# Patient Record
Sex: Female | Born: 1935
Health system: Southern US, Community
[De-identification: ages and names within clinical notes are randomized; demographics above are authoritative.]

## PROBLEM LIST (undated history)

## (undated) DIAGNOSIS — I2699 Other pulmonary embolism without acute cor pulmonale: Secondary | ICD-10-CM

## (undated) DIAGNOSIS — I10 Essential (primary) hypertension: Secondary | ICD-10-CM

## (undated) DIAGNOSIS — M179 Osteoarthritis of knee, unspecified: Secondary | ICD-10-CM

## (undated) DIAGNOSIS — H269 Unspecified cataract: Secondary | ICD-10-CM

## (undated) DIAGNOSIS — M51369 Other intervertebral disc degeneration, lumbar region without mention of lumbar back pain or lower extremity pain: Secondary | ICD-10-CM

## (undated) DIAGNOSIS — N183 Chronic kidney disease, stage 3 unspecified: Secondary | ICD-10-CM

## (undated) DIAGNOSIS — K760 Fatty (change of) liver, not elsewhere classified: Secondary | ICD-10-CM

## (undated) HISTORY — PX: JOINT REPLACEMENT: SHX530

## (undated) HISTORY — DX: Essential (primary) hypertension: I10

## (undated) HISTORY — DX: Fatty (change of) liver, not elsewhere classified: K76.0

## (undated) HISTORY — PX: FRACTURE SURGERY: SHX138

## (undated) HISTORY — DX: Chronic kidney disease, stage 3 unspecified: N18.30

## (undated) HISTORY — DX: Unspecified cataract: H26.9

## (undated) HISTORY — DX: Other pulmonary embolism without acute cor pulmonale: I26.99

## (undated) HISTORY — DX: Other intervertebral disc degeneration, lumbar region without mention of lumbar back pain or lower extremity pain: M51.369

## (undated) HISTORY — DX: Osteoarthritis of knee, unspecified: M17.9

---

## 1997-09-08 ENCOUNTER — Other Ambulatory Visit: Admission: RE | Admit: 1997-09-08 | Discharge: 1997-09-08 | Payer: Self-pay | Admitting: Gynecology

## 1998-03-03 ENCOUNTER — Observation Stay (HOSPITAL_COMMUNITY): Admission: RE | Admit: 1998-03-03 | Discharge: 1998-03-04 | Payer: Self-pay | Admitting: Specialist

## 1998-03-03 ENCOUNTER — Encounter: Payer: Self-pay | Admitting: Specialist

## 1998-11-21 ENCOUNTER — Other Ambulatory Visit: Admission: RE | Admit: 1998-11-21 | Discharge: 1998-11-21 | Payer: Self-pay | Admitting: Gynecology

## 1999-02-21 ENCOUNTER — Encounter: Admission: RE | Admit: 1999-02-21 | Discharge: 1999-02-21 | Payer: Self-pay | Admitting: Family Medicine

## 1999-02-21 ENCOUNTER — Encounter: Payer: Self-pay | Admitting: Family Medicine

## 2000-08-20 ENCOUNTER — Encounter: Payer: Self-pay | Admitting: Orthopedic Surgery

## 2000-08-26 ENCOUNTER — Encounter: Payer: Self-pay | Admitting: Orthopedic Surgery

## 2000-08-26 ENCOUNTER — Inpatient Hospital Stay (HOSPITAL_COMMUNITY): Admission: RE | Admit: 2000-08-26 | Discharge: 2000-08-29 | Payer: Self-pay | Admitting: Orthopedic Surgery

## 2000-08-29 ENCOUNTER — Inpatient Hospital Stay (HOSPITAL_COMMUNITY)
Admission: RE | Admit: 2000-08-29 | Discharge: 2000-09-02 | Payer: Self-pay | Admitting: Physical Medicine & Rehabilitation

## 2000-12-02 ENCOUNTER — Encounter: Payer: Self-pay | Admitting: Orthopedic Surgery

## 2000-12-04 ENCOUNTER — Inpatient Hospital Stay (HOSPITAL_COMMUNITY): Admission: RE | Admit: 2000-12-04 | Discharge: 2000-12-08 | Payer: Self-pay | Admitting: Orthopedic Surgery

## 2000-12-04 ENCOUNTER — Encounter: Payer: Self-pay | Admitting: Orthopedic Surgery

## 2000-12-08 ENCOUNTER — Inpatient Hospital Stay (HOSPITAL_COMMUNITY)
Admission: RE | Admit: 2000-12-08 | Discharge: 2000-12-11 | Payer: Self-pay | Admitting: Physical Medicine & Rehabilitation

## 2001-01-11 ENCOUNTER — Encounter: Payer: Self-pay | Admitting: Emergency Medicine

## 2001-01-11 ENCOUNTER — Inpatient Hospital Stay (HOSPITAL_COMMUNITY): Admission: EM | Admit: 2001-01-11 | Discharge: 2001-01-12 | Payer: Self-pay | Admitting: Emergency Medicine

## 2001-03-16 ENCOUNTER — Observation Stay (HOSPITAL_COMMUNITY): Admission: RE | Admit: 2001-03-16 | Discharge: 2001-03-17 | Payer: Self-pay | Admitting: Orthopedic Surgery

## 2001-03-16 ENCOUNTER — Encounter: Payer: Self-pay | Admitting: Orthopedic Surgery

## 2001-04-15 ENCOUNTER — Encounter: Payer: Self-pay | Admitting: Orthopedic Surgery

## 2001-04-15 ENCOUNTER — Inpatient Hospital Stay (HOSPITAL_COMMUNITY): Admission: RE | Admit: 2001-04-15 | Discharge: 2001-04-18 | Payer: Self-pay | Admitting: Orthopedic Surgery

## 2005-03-06 ENCOUNTER — Ambulatory Visit: Payer: Self-pay | Admitting: Pulmonary Disease

## 2005-03-06 ENCOUNTER — Inpatient Hospital Stay (HOSPITAL_COMMUNITY): Admission: EM | Admit: 2005-03-06 | Discharge: 2005-03-14 | Payer: Self-pay | Admitting: Emergency Medicine

## 2005-03-06 ENCOUNTER — Encounter (INDEPENDENT_AMBULATORY_CARE_PROVIDER_SITE_OTHER): Payer: Self-pay | Admitting: *Deleted

## 2005-03-06 ENCOUNTER — Ambulatory Visit: Payer: Self-pay | Admitting: Internal Medicine

## 2005-03-08 ENCOUNTER — Encounter (INDEPENDENT_AMBULATORY_CARE_PROVIDER_SITE_OTHER): Payer: Self-pay | Admitting: Specialist

## 2005-04-04 ENCOUNTER — Ambulatory Visit: Payer: Self-pay | Admitting: Internal Medicine

## 2005-05-08 ENCOUNTER — Ambulatory Visit: Payer: Self-pay | Admitting: Internal Medicine

## 2007-05-02 ENCOUNTER — Emergency Department (HOSPITAL_COMMUNITY): Admission: EM | Admit: 2007-05-02 | Discharge: 2007-05-02 | Payer: Self-pay | Admitting: Emergency Medicine

## 2007-05-26 ENCOUNTER — Encounter: Admission: RE | Admit: 2007-05-26 | Discharge: 2007-05-26 | Payer: Self-pay | Admitting: *Deleted

## 2007-05-27 ENCOUNTER — Ambulatory Visit (HOSPITAL_BASED_OUTPATIENT_CLINIC_OR_DEPARTMENT_OTHER): Admission: RE | Admit: 2007-05-27 | Discharge: 2007-05-28 | Payer: Self-pay | Admitting: *Deleted

## 2010-06-19 NOTE — Op Note (Signed)
NAME:  [name], [name] NO.:  [id number]   MEDICAL RECORD NO.:  [id number]          PATIENT TYPE:  AMB   LOCATION:  DSC                          FACILITY:  [facility]   PHYSICIAN:  Tennis Must Meyerdierks, M.D.DATE OF BIRTH:  October 09, 1931   DATE OF PROCEDURE:  DATE OF DISCHARGE:                               OPERATIVE REPORT   PREOPERATIVE DIAGNOSIS:  Comminuted intra-articular fracture, left  distal radius.   POSTOPERATIVE DIAGNOSIS:  Comminuted intra-articular fracture, left  distal radius.   PROCEDURE:  Open reduction and internal fixation, left distal radius  fracture.   SURGEON:  Lowell Bouton, MD   ANESTHESIA:  General.   OPERATIVE FINDINGS:  The patient had significant radial displacement and  a longitudinal split of the fractured distal radius.  The bone was quite  soft, and there was significant callus formation.  The fracture was just  over 3 weeks' old.   PROCEDURE:  Under general anesthesia with a tourniquet on the left arm,  the left hand was prepped and draped in the usual fashion.  After  explaining the limb, the tourniquet was inflated to 250 mmHg.  A  longitudinal traction was applied across finger traps on the index,  long, and ring fingers over the end of the table with 10 pounds.  A  longitudinal incision was made overlying the FCR tendon and carried down  through the subcutaneous tissues.  Sharp dissection was carried through  the FCR tendon sheath and the tendon was retracted ulnarly.  The floor  of the sheath was incised with a scissors.  Freer elevator was then used  to elevate the FPL tendon ulnarly and the pronator quadratus was  identified.  A Weitlaner retractor was inserted.  The pronator was  released from its radial attachments sharply with a knife, and an  elevator was used to elevate it ulnarly.  The fracture site was then  identified and callus that had formed was removed with a rongeur.  A  Freer elevator was used to  manipulate the fracture fragments.  The  fracture was then reduced as close to anatomic as possible.  The styloid  fragment had remained slightly radial.  A 0.045 K-wire was placed  through the radial styloid across the fracture site to hold the styloid  fragment in place.  This helped to hold the reduction while a DVR plate  was applied.  Fluoroscopy was used during the procedure to align the  fragments.  A left short DVR plate was then applied with 3.5-mm screws  proximally and 2.5 pegs distally.  Four pegs were used and x-rays showed  good position of the fracture and the pegs of the screws.  There was no  evidence of penetration of the articular surface.  The K-wire was left  in and bent over and left protruding from the skin.  The wound was  irrigated copiously with saline.  A TLS drain was inserted.  0.5%  Marcaine was placed in the skin edges for pain control.  Pronator  quadratus was then repaired with a 4-0 Vicryl suture, subcutaneous  tissue was  closed with  4-0 Vicryl, and the skin with a 3-0 subcuticular Prolene.  Steri-Strips were applied followed by sterile dressings and a volar  wrist splint.  The patient tolerated the procedure well and went to the  recovery room awake and stable in good condition.      Lowell Bouton, M.D.  Electronically Signed     EMM/MEDQ  D:  05/27/2007  T:  05/28/2007  Job:  [id number]

## 2010-06-22 NOTE — Op Note (Signed)
[facility]  Patient:    [name], [name] Visit Number: [id number] MRN: [id number]          Service Type: SUR Location: 4W 0469 01 Attending Physician:  Skip Mayer Dictated by:   Georges Lynch Darrelyn Hillock, M.D. Proc. Date: 03/16/01 Admit Date:  03/16/2001                             Operative Report  PREOPERATIVE DIAGNOSIS:  Posterior dislocation of a left total hip arthroplasty.  POSTOPERATIVE DIAGNOSIS:  Posterior dislocation of a left total hip arthroplasty.  OPERATION/PROCEDURE:  Closed reduction of a dislocated total hip arthroplasty on left.  SURGEON:  Georges Lynch. Gioffre, M.D.  ASSISTANT:  ______  PROCEDURE:  Under general anesthesia a gentle closed reduction of a posterior dislocation of the left hip was carried out.  The hip was easily reduced.  She was placed on abduction pillow and will be admitted overnight and placed in a brace.  I did discuss the case with her husband and son.  I think that she is going to need to have a revision of her polyethylene insert since this is her second dislocation and since the fact that her hip appears to be so unstable. Dictated by:   Georges Lynch Darrelyn Hillock, M.D. Attending Physician:  Skip Mayer DD:  03/16/01 TD:  03/16/01 Job: [id number]

## 2010-06-22 NOTE — H&P (Signed)
[facility]  Patient:    [name], [name] Visit Number: [id number] MRN: [id number]          Service Type: SUR Location: 4W 0469 01 Attending Physician:  Skip Mayer Dictated by:   Ralene Bathe, P.A. Admit Date:  03/16/2001 Discharge Date: 03/17/2001                           History and Physical  DATE OF BIRTH:  1931-09-28  CHIEF COMPLAINT:  Left hip instability.  HISTORY OF PRESENT ILLNESS:  Ms. Ballengee is a 77 year old patient of Dr. Darrelyn Hillock who is now approximately three months out from total hip arthroplasty on the left.  She has had two left hip dislocations.  She is felt to be unstable and is currently being treated in her pelvic band and abduction brace.  She has instability type symptoms, and the subluxation type sensation with certain movements.  At this time it is felt that conversion to a constrained cup is indicated.  The risks and benefits were discussed with the patient at length, and she wishes to proceed.  PAST MEDICAL HISTORY:  Osteoarthritis.  PAST SURGICAL HISTORY: 1. Right total knee arthroplasty. 2. Left total knee arthroplasty. 3. Status post closed reduction of left total hip arthroplasty x 2. 4. ORIF right wrist. 5. ORIF right shoulder.  MEDICATIONS:  Percocet, Ambien, and Xanax p.r.n.  ALLERGIES:  VALIUM causes nausea and vomiting.  SOCIAL HISTORY:  Does not smoke or drink.  She lives at home with her husband and is quite active in gardening and work.  She had a greenhouse.  FAMILY HISTORY:  Noncontributory.  REVIEW OF SYSTEMS:  The patient denies any recent fevers, chills, night sweats.  No bleeding tendencies.  CNS:  No blurred or double vision.  No headaches, seizures, paralysis.  RESPIRATORY:  No shortness of breath, productive cough, hemoptysis.   CARDIOVASCULAR:  No chest pain, angina, orthopnea.  GASTROINTESTINAL:  No nausea, vomiting, constipation, melena, bloody stools.   GENITOURINARY:  No dysuria, hematuria.  MUSCULOSKELETAL:  As pertinent to present illness.  PHYSICAL EXAMINATION:  GENERAL:  The patient is a well-developed, well-nourished 77 year old female. She is evaluated in pelvic abduction brace.  VITAL SIGNS:  Blood pressure 118/64.  HEENT:  Normocephalic.  Extraocular motions intact.  NECK:  Supple.  No lymphadenopathy.  No carotid bruits appreciated on exam.  CHEST:  Clear to auscultation bilaterally.  No rales, no rhonchi.  HEART:  Regular rate and rhythm.  No murmurs, gallops, rubs, heaves, or thrills.  ABDOMEN:  Positive bowel sounds.  Soft and nontender.  EXTREMITIES:  Neurovascular is intact to bilateral lower extremities.  No pitting edema is noted.  IMPRESSION:  Left total hip instability status post left total hip arthroplasty.  PLAN:  Admission and conversion to left total hip arthroplasty with a constrained cup. Dictated by:   Ralene Bathe, P.A. Attending Physician:  Skip Mayer DD:  04/08/01 TD:  04/08/01 Job: [id number]

## 2010-06-22 NOTE — Discharge Summary (Signed)
[facility]  Patient:    ALOHA, [name]                  MRN: [id number] Adm. Date:  [id number] Disc. Date: 08/29/00 Attending:  Skip Mayer Dictator:   Alexzandrew L. Julien Girt, P.A.-C. CC:         Dr. Faustino Congress, [facility]  Laurier Nancy, M.D.   Discharge Summary  ADMITTING DIAGNOSES:  1. End-stage degenerative arthritis, right hip.  2. Insomnia.  DISCHARGE DIAGNOSES:  1. Severe degenerative arthritis, right hip, status post right total hip     replacement and arthroplasty.  2. Mild postoperative anemia (not requiring transfusion).  3. Positive fluid overload following surgery, improved.  4. Insomnia.  PROCEDURES:  The patient was taken to the operating room on August 26, 2000, and underwent a right total hip replacement and arthroplasty.  SURGEON:  Georges Lynch. Darrelyn Hillock, M.D.  ASSISTANT:  Kerrin Champagne, M.D.  ANESTHESIA:  General.  CONSULTS:  Rehabilitation services, Dr. Johna Roles.  HISTORY OF PRESENT ILLNESS:  The patient is a 77 year old female with chronic progressive bilateral hip pain.  She has been seen for several months, recently with increasing hip pain on the right which started to interfere with her daily activities.  She has been treated conservatively in the past with anti-inflammatories and analgesics.  The pain started to interfere to the point where she was having difficulty walking, and it was felt she would benefit from undergoing replacement of the hips.  We pursued to undergo surgery on the right hip first.  She subsequently was admitted to the hospital.  LABORATORY DATA:  CBC on admission showed hemoglobin 12.7, hematocrit 37.2, white cell count 4.9, red cell count 3.66.  Serial H&Hs followed throughout hospital course.  Postoperative hemoglobin down to 12.2 and 35.8.  Last H&H was 9.7 with a hematocrit of 28.4.  PT/PTT on admission was 12.3 and 31, respectively.  Serial pro time/INRs were  followed for Coumadin protocol.  Last noted PT/INR at time of transfer 17.6 and 1.6.  Chem panel on admission all within normal limits.  Followed BMP on August 28, 2000, showed slight increase in glucose from 98 to 140, otherwise BMP within normal limits.  Urinalysis preoperatively showed small leukocyte esterase, rare epithelial cells, few bacteria.  Followup urinalysis on August 26, 2000, negative UA.  Blood group type A-positive.  X-RAYS:  Chest x-ray, no active disease, mild right lower lobe atelectasis versus scarring, August 20, 2000.  Hip films done on August 20, 2000, showed severe osteoarthritis of right hip with complete loss of superior joint space, height, and associated sclerosis of mild deformity of the femoral head.  EKG dated August 20, 2000, normal sinus rhythm, normal EKG, no significant changes in last tracing confirmed by Dr. Eden Emms.  HOSPITAL COURSE:  The patient was admitted to [facility], taken to operating room, and underwent the above-mentioned procedure without complication.  The patient tolerated the procedure well and was later transferred to the recovery room and then to orthopedic floor for continued postoperative care.  The patient was placed on PCA analgesic for pain control following surgery.  She was noted to have positive fluid balance overload. She was given Lasix for some mild diuresis.  Electrolytes were followed.  She was placed on touchdown weightbearing.  Physical therapy and occupational therapy were consulted to assist with postoperative ambulation, gait training, and hip precautions.  The patient was seen by rehab service, Dr. Johna Roles postoperative, and it was felt  she would benefit from inpatient rehab stay.  It was felt she would be transferred over when a bed became available.  Dressing changes were initiated on postoperative day two and wound was healing well throughout hospital course.  Foley catheter was discontinued on postoperative  day two.  She did have a spike in temperature on postoperative day two of 101.1.  This was treated with incentive spirometry and antiemetics.  Temperature did come back down and responded to the incentive spirometry and medications.  It was noted that there was a bed possibly opened on rehab on August 29, 2000.  The patient was doing well, and she had been weaned off her PCA analgesics over to p.o. analgesics.  She was starting to progress with physical therapy.  She was already ambulating approximately 70 feet by postoperative day three.  It was decided that the patient could be transferred over at that time.  DISCHARGE PLAN:  1. The patient will be transferred to [facility] on August 29, 2000.  2. Discharge diagnoses, please see above.  DISCHARGE MEDICATIONS:  1. Colace 100 mg p.o. b.i.d.  2. Trinsicon 1 p.o. t.i.d.  3. Percocet 1-2 q.4-6h. as needed for pain.  4. Robaxin 500 mg 1 p.o. q.6-8h. p.r.n. spasm.  5. Coumadin protocol.  6. Ambien 10 mg p.o. q.h.s. p.r.n. sleep.  7. Phenergan 25 mg p.o. q.4-6h. p.r.n. nausea.  8. Reglan 10 mg p.o. q.8h. p.r.n. nausea.  9. Tylenol 1-2 q.4-6h. as needed for mild pain. 10. Restoril 15-30 mg p.o. q.h.s. p.r.n. sleep.  DIET:  As tolerated.  ACTIVITIES:  She is touchdown weightbearing to the right lower extremity. Gait training as per PT/OT for ADLs on rehab.  PRECAUTIONS:  Hip precautions at all times.  The patient is to use her knee immobilizer at night while in bed.  During the day she may use a regular pillow between the legs for hip precautions.  Dressing changes daily.  FOLLOWUP:  The patient will follow up with Dr. Georges Lynch. Gioffre in two weeks from date of surgery or following discharge from the rehab unit.  Please contact the office at [phone] for appointment time.  DISPOSITION:  [facility].  CONDITION ON DISCHARGE:  Improved. DD:  08/29/00 TD:  08/29/00 Job: [id number]

## 2010-06-22 NOTE — Discharge Summary (Signed)
[facility]  Patient:    [name], [name] Visit Number: [id number] MRN: [id number]          Service Type: SUR Location: 4W 0452 01 Attending Physician:  Skip Mayer Dictated by:   Ottie Glazier. Wynona Neat, P.A.-C. Admit Date:  04/15/2001 Discharge Date: 04/18/2001                             Discharge Summary  ADMITTING DIAGNOSES: 1. History of left total hip arthroplasty x2 with closed reduction for    displacement. 2. History of bilateral knee arthroplasty. 3. History of open reduction internal fixation to the right wrist. 4. History of open reduction internal fixation to the right shoulder.  DISCHARGE DIAGNOSES: 1. History of left total hip arthroplasty x2 with closed reduction for    displacement; improved. 2. History of bilateral knee arthroplasty. 3. History of open reduction internal fixation to the right wrist. 4. History of open reduction internal fixation to the right shoulder.  PROCEDURE:  Left total hip arthroplasty revision with revision of polyethylene cup and constrained cup liner of the left hip per Dr. Darrelyn Hillock.  HISTORY OF PRESENT ILLNESS:  The patient is a 77 year old female who is approximately 3 months from a total hip arthroplasty on the left, the patient unfortunately had two subsequent left hip dislocations thereafter.  The patient feels as though the hip is unstable and is currently being treated in a pelvic ______ adduction brace.  Secondary to these instability type symptoms as well as the two subsequent hip dislocations decision made to proceed with operative intervention in the form of a conversion to a constrained cup per Dr. Darrelyn Hillock.  The risks and benefits of the surgery were discussed in great detail with the patient.  Preoperative labs showed that her H&H were 13.4 and 38.4; PT and INR were 12.1 and 0.9; her BMET was within normal limits; her liver functions were within normal limits; her UA showed few  bacteria, few epithelial cells and small amount of leukocyte esterase.  HOSPITAL COURSE:  The patient underwent a left total hip revision with polyethylene liner and constrained cup placement per Dr. Darrelyn Hillock on April 15, 2001.  Please see operative report for details.  The patient tolerated the procedure very well and on postoperative day #1 she was awake, no complaints, did have an episode of nausea and vomiting the day prior, states it is resolved today, she is very eager for physical therapy and then discharge home as soon as possible, again stating her pain was well controlled, her vital signs were stable, she had a T-max of 100, hemoglobin was stable at 10.7, left hip showed the dressing was clean, dry, intact, she was moving the left lower extremity well, sensation was intact, positive pedal pulses.  The patient will continue physical therapy and occupational therapy for the remaining part of the day.  PCA would also be weaned.  On postoperative day #2 the patient was awake, stating she was doing fairly well, she did have a little headache earlier that morning otherwise no major complaints, her vital signs were stable, she was afebrile, H&H were 10.1 and 29.7, PT and INR were 15 and 1.2. Examination of the left hip showed that the dressings were clean, dry, intact, the skin was intact, incision was intact, motor and sensory functions were intact.  Due to her improving condition the PCA was discontinued as well as her IV fluids, a _________ was then  initiated, anticipated discharge home would be for the following morning.  On postop day #3 the patient was up eating breakfast, stating that she was doing extremely well, no problems or complaints, she was eager for discharge.  We did have a long discussion concerning her postoperative Coumadin management as the patient did not need home health physical therapy, however, she did need followup regarding her Coumadin management and she  stated that Dr. Tommi Rumps office would provide this for her and care management had apparently already set this up on April 16 2001, therefore, Dr. Tommi Rumps office would be aware of her postoperative anticoagulation needs and management.  Her vital signs were stable, she was afebrile, PT and INR were 16.0 and 1.4, left hip incision was clean, dry, and intact, no discharge, no signs/symptoms of infection, motor and sensory seems intact.  FINAL DIAGNOSES: 1. Status post left total hip arthroplasty revision. 2. History of bilateral knee arthroplasty. 3. History of open reduction internal fixation to the right wrist and    shoulder.  CONDITION ON DISCHARGE:  Improved.  DIET:  Regular.  ACTIVITY:  She will be weightbearing as tolerated.  WOUND CARE:  Keep dressings clean, dry, and intact.  DISCHARGE INSTRUCTIONS:  Again, no home health PT necessary.  She will follow up with Dr. Milus Glazier for anticoagulation management therapy.  DISCHARGE MEDICATIONS: 1. Percocet 5 one to two p.o. q.4-6 p.r.n. (#40). 2. Robaxin 500 mg one p.o. q.6-8 p.r.n. (#40). 3. Coumadin per pharmacy x4 weeks.  FOLLOWUP:  She is to follow up with Dr. Darrelyn Hillock in 2 weeks.  Call [phone] for appointment, questions, or concerns. Dictated by:   Ottie Glazier. Wynona Neat, P.A.-C. Attending Physician:  Skip Mayer DD:  04/23/01 TD:  04/24/01 Job: [id number]

## 2010-06-22 NOTE — Discharge Summary (Signed)
[place]  Patient:    [name], [name]                  MRN: [id number] Adm. Date:  [id number] Disc. Date: [id number] Attending:  Faith Rogue T Dictator:   Junie Bame, P.A. CC:         Georges Lynch Darrelyn Hillock, M.D.  Elvina Sidle, M.D.   Discharge Summary  DISCHARGE DIAGNOSES: 1. Status post right total hip replacement. 2. Anemia. 3. Insomnia. 4. Constipation.  HISTORY OF PRESENT ILLNESS:  The patient is a 77 year old white female with a past medical history unremarkable.  Admitted on August 26, 2000, to [facility] for right total hip replacement by Dr. Darrelyn Hillock due to end-stage OA.  Hospital course significant for anemia secondary to blood loss.  Physical therapy report at that time indicated the patient was ambulating at close supervision 110 feet with standard walker and transfer sit-to-stand at supervision level. The patient is touchdown weightbearing.  The patient was on Coumadin for DVT prophylaxis.  Primary medical Shantel Helwig is Dr. Milus Glazier.  PAST MEDICAL HISTORY: 1. OA. 2. Sleep disorder. 3. Right shoulder fracture. 4. Right wrist fracture. 5. Cataracts.  PAST SURGICAL HISTORY:  Manipulation and pinning of right wrist fracture.  MEDICATIONS PRIOR TO ADMISSION:  Vicodin and Restoril.  ALLERGIES:  DARVOCET.  FAMILY HISTORY:  Noncontributory.  SOCIAL HISTORY:  Patient lives with husband in [place].  She was independent prior to admission without any device.  She has two stairs in home.  HOSPITAL COURSE:  [name] was admitted to rehabilitation on August 29, 2000, for comprehensive inpatient rehabilitation, where she received more than three hours of PT and OT therapies daily.  [name] had a short stay in rehabilitation.  She did fairly well.  Besides anemia and constipation, there were no major other medical issues during her four-day stay in rehabilitation. The patient was on Trinsicon b.i.d. during her  entire stay in rehabilitation for anemia, and the patient was on Senokot-S 2 tablets p.o. q.h.s. for constipation.  Latest laboratories indicated that the patients hemoglobin was 9.7, hematocrit 27.8, white blood cell count was 5.6, platelets 430.  INR at the time of discharge was 2.0.  Sodium 141, potassium 3.5, glucose 105, BUN was 9, creatinine 0.7.  Urinalysis was negative.  At the time of discharge all vital signs were stable.  Physical exam was unremarkable.  Staples were still intact.  Surgical incision demonstrated no signs of infection.  The patient was discharged home with family.  PT report at the time of discharge indicated the patient was ambulating modified independently 150 feet with standard walker, can transfer sit-to-stand with modified independence.  She can perform all ADLs modified independently.  DISCHARGE MEDICATIONS: 1. Trinsicon 1 tablet twice daily. 2. Coumadin 2 mg 1/2 per day with supper through October 03, 2000. 3. Restoril 50 mg, to resume home dose. 4. Oxycodone 5 mg 1-2 tablets every four to six hours as needed for pain. 5. Senokot-S 2 pills at bedtime for constipation. 6. Vioxx 25 mg 1 p.o. per day.  ACTIVITY:  To use walker.  Observe hip precautions.  WOUND CARE:  Keep area clean and dry.  DISCHARGE INSTRUCTIONS:  She is to receive [facility] for PT and to monitor Coumadin.  First draw will be August 1 and to continue until September 06, 2000.  FOLLOW-UP:  She is to follow up with Dr. Darrelyn Hillock within one week, with primary care Dontarious Schaum within four to six weeks.  Follow up with Dr. Riley Kill as needed. D:  09/09/00 TD:  09/11/00 Job: [id number]

## 2010-06-22 NOTE — Discharge Summary (Signed)
[place]. [facility]  Patient:    POOJA, CAMUSO Visit Number: [id number] MRN: [id number]          Service Type: [facility] Location: [id number] Attending Physician:  Faith Rogue T Dictated by:   Mcarthur Rossetti. Angiulli, P.A. Admit Date:  12/08/2000 Discharge Date: 12/11/2000   CC:         Windy Fast A. Darrelyn Hillock, M.D.             Dr. August Saucer of [facility]                           Discharge Summary  DISCHARGE DIAGNOSES: 1. Left total hip replacement December 04, 2000. 2. Postoperative anemia. 3. History of right total hip replacement July 2002.  HISTORY OF PRESENT ILLNESS:  A 77 year old female with history of right total hip replacement July 2002, of which she did receive inpatient rehab services for, who now presented December 04, 2000, with end-stage osteoarthritis of the left hip and no relief with conservative care.  She underwent a left total hip replacement on December 04, 2000, per Georges Lynch. Darrelyn Hillock, M.D.  She was placed on Coumadin for deep venous thrombosis prophylaxis and touchdown weightbearing.  Postoperative pain management.  No chest pain or shortness of breath.  She was minimal assistance for ambulation. Latest hemoglobin 8.7. INR 1.7.  Chest x-ray negative.  She was admitted for a comprehensive rehab program.  PAST MEDICAL HISTORY:  Insomnia.  PAST SURGICAL HISTORY:  Right total hip replacement July 2002, cataract surgery, right wrist surgery, and open reduction and internal fixation of right humerus.  ALLERGIES:  DARVOCET.  MEDICATIONS PRIOR TO ADMISSION:  Vioxx.  SOCIAL HISTORY:  No alcohol, no tobacco.  She lives with her husband in [place], [place]. She was independent prior to admission.  She lives in a one level home with three steps to entry.  Husband with limited assistance secondary to arthritis.  Son can also assist.  HOSPITAL COURSE:  The patient did well while in rehabilitation services with therapies initiated  on a a b.i.d. basis.  The following issues were followed during the patients rehab course.  Pertaining to Ms. Gerringers left total hip replacement, she remained stable.  Surgical site healing nicely.  No sign of infection. She would follow up with Dr. Darrelyn Hillock for removal of clips. She was touchdown weightbearing with a advisement for total hip precautions.  Her mobility continued to improve. She would receive home health physical and occupational therapy per [facility].  She continued on Coumadin for deep venous thrombosis prophylaxis. She had no bleeding episodes.  Latest INR of 2.2.  She would complete Coumadin protocol with home health nurse to be provided.  Blood pressures remained controlled throughout her rehab course. There was no headache or dizziness.  Her latest hemoglobin was 9.3, hematocrit 27.1.  This showed a generous improvement from 8.7.  She continued on iron supplement.  She had no bowel or bladder disturbances.  Overall for her functional mobility, she was ambulating household functional distances with a walker, essentially independent to standby assist in all areas of activities of daily living, of dressing, grooming, and homemaking.  Overall, her strength and endurance greatly improved.  She was encouraged with her overall progress.  She was discharged to home.  DISCHARGE MEDICATIONS: 1. Coumadin 5 mg daily to complete Coumadin protocol. 2. Trinsicon twice daily. 3. Restoril 15 mg one or two tablets at bedtime as needed.  4. Tylox one or two tablets every four hours as needed for pain. 5. Tylenol as needed.  ACTIVITY:  Touchdown weightbearing with walker with hip precautions.  DIET:  Regular.  WOUND CARE:  Follow-up with Dr. Darrelyn Hillock for removal of staples.  DISCHARGE INSTRUCTIONS:  No aspirin or ibuprofen while on Coumadin.  Home health nurse per [facility] for prothrombin time to complete Coumadin therapy.  FOLLOW-UP:  She should follow up  with Dr. August Saucer, of [facility] [place], for medical management. Dictated by:   Mcarthur Rossetti. Angiulli, P.A. Attending Physician:  Faith Rogue T DD:  12/10/00 TD:  12/11/00 Job: [id number]

## 2010-06-22 NOTE — Op Note (Signed)
[name]. [facility]  Patient:    [name] Visit Number: [id number] MRN: [id number]          Service Type: [facility] Location: [id number] Attending Physician:  Faith Rogue T Proc. Date: 12/04/00 Admit Date:  12/08/2000 Discharge Date: 12/11/2000                             Operative Report  SURGEON:  Windy Fast A. Darrelyn Hillock, M.D.  ASSISTANTs:  Philips J. Montez Morita, M.D. and Alexzandrew L. Perkins, P.A.-C.  PREOPERATIVE DIAGNOSIS:  Severe degenerative arthritis of the left hip.  POSTOPERATIVE DIAGNOSIS:  Severe degenerative arthritis of the left hip.  OPERATION:  Left total hip arthroplasty utilizing the Osteonics system.  The sizes used was a 54 mm cup with two screws and a 10 degree insert.  The prosthesis itself, we utilized a +10 C-tapered head on a size 9 Secure-Fit stem.  No cement was used.  DESCRIPTION OF PROCEDURE:  Under general anesthesia, a routine orthopedic prep and draping of the left hip was carried out.  A posterolateral approach of the hip was carried out in the usual fashion.  She had 1 g of IV Ancef.  At this time, an incision was made over the posterolateral aspect of the left hip, bleeders identified and cauterized.  I then went down and incised the iliotibial band and dissected the gluteal muscles by blunt dissection. Following this, the self-retaining retractors were inserted.  I then went down and detached the external rotators with great care taken not to injure the underlying sciatic nerve.  Following this, I did a capsulectomy.  I dislocated the femoral head.  I amputated the femoral head with an oscillating saw at the appropriate neck length.  We then carried out a reaming and rasping for a porous coated component up to a size 9 Secure-Fit.  Following this, the distal tip was reamed to a 13.5 mm diameter.  Once the femur was prepared, we then completed our capsulectomy.  The retractors then were inserted to expose  the acetabulum, and we began the reaming up to a size 54 mm cup.  We had good bleeding bone at this time.  The trial components were inserted.  We went through a trial range of motion.  We first utilized a +5 C-tapered head, then a +10, and felt that the +10 was more stable.  We then removed the trial components from the acetabulum, irrigated the acetabulum, and inserted our permanent 54 mm cup, and two screws were inserted for fixation purposes.  We then went through trials once again to make sure we had the appropriate trial cup position.  We had excellent stability.  We then inserted our permanent 10 degree insert into the acetabulum and then inserted our permanent Secure-Fit size 9 stem into the femur.  We then went through trials of the C-tapered head with a +5 and then a +10.  The +10 was much more stable, so we selected a permanent +10 C-tapered head and applied that.  We thoroughly irrigated out the area.  Good hemostasis was maintained.  We then reattached the rotators in the usual fashion with #1 Tycron.  The remaining part of the wound was closed in layers in the usual fashion, and sterile Neosporin dressing was applied. Attending Physician:  Faith Rogue T DD:  12/04/00 TD:  12/05/00 Job: [id number]

## 2010-06-22 NOTE — Op Note (Signed)
[name]. [facility]  Patient:    [name], [name] Visit Number: [id number] MRN: [id number]          Service Type: SUR Location: 5000 [id number] Attending Physician:  Verlee Rossetti. Dictated by:   Almedia Balls Ranell Patrick, M.D. Proc. Date: 01/11/01 Admit Date:  01/11/2001   CC:         Windy Fast A. Darrelyn Hillock, M.D.   Operative Report  PREOPERATIVE DIAGNOSIS:  Left total hip replacement closed dislocation.  POSTOPERATIVE DIAGNOSIS:  Left total hip replacement closed dislocation.  PROCEDURE:  Closed reduction of left total hip replacement.  SURGEON:  Almedia Balls. Ranell Patrick, M.D.  ANESTHESIA:  General.  ESTIMATED BLOOD LOSS:  0.  FLUID REPLACEMENT:  300 cc of crystalloids.  COMPLICATIONS:  Instrument count was correct.  No complications.  INDICATIONS FOR PROCEDURE:  The patient is a 77 year old woman who had a total hip replacement done by Dr. Ranee Gosselin on 12/04/00, on her left side.  She had an uncomplicated postoperative course, however, this morning on 01/11/01, she was getting up from bed and had a closed dislocation of her left hip.  She was unable to ambulate after this.  She presented for evaluation to [facility].  X-rays demonstrated a posterior superior dislocation of her left hip with no evidence of prosthetic loosening.  Informed consent was obtained, and the patient was consented for closed reduction.  DESCRIPTION OF PROCEDURE:  After an adequate level of anesthesia was achieved, a gentle closed reduction was performed of the left hip.  The hip reduced easily, however, it was noted to be quite unstable at dislocated at 30 degrees of flexion, and any attempted internal rotation.  The patient was re-reduced and placed in extension in a knee immobilizer in abduction pillow.  She had good pulses after the reduction.  X-rays in the operating room demonstrated a concentric reduction of the left hip. Dictated by:   Almedia Balls Ranell Patrick,  M.D. Attending Physician:  Malon Kindle R. DD:  01/11/01 TD:  01/11/01 Job: [id number]

## 2010-06-22 NOTE — Consult Note (Signed)
NAME:  [name], [name] NO.:  [id number]   MEDICAL RECORD NO.:  [id number]          PATIENT TYPE:  INP   LOCATION:  3301                         FACILITY:  [facility]   PHYSICIAN:  Garnetta Buddy, M.D.   DATE OF BIRTH:  Dec 30, 1931   DATE OF CONSULTATION:  03/07/2005  DATE OF DISCHARGE:                                   CONSULTATION   77 year old white female who has not been followed by her primary care  physician who presented to the emergency room with a one-week history of  malaise, poor appetite, decreased foot intake, and lower abdominal pain.  She has a history of nonsteroidal anti-inflammatory drug use as well as  Tylenol for chronic degenerative joint disease.  She has some alcohol use,  but according to the son, is mild and moderate.  She was found sitting in  the chair short of breath, increasing rate of breathing, unsteady on walking  and went immediately to the emergent care center.  The family said that she  was in a normal heart rhythm and sent immediately to [facility].  On arrival in the emergency room she was found to be acidotic.  She received  intravenous contrast to rule out pulmonary embolus and chest CT as well as  an abdominal CT.  No pulmonary embolus was seen.  2-D echocardiogram  performed on March 06, 2005 was unremarkable.  Serum creatinine on  admission was 1.  Serum creatinine next day was 3.  She was oliguric.   PAST MEDICAL HISTORY:  1.  Left total hip arthroplasty.  2.  History of right knee arthroplasty.  3.  Left knee arthroplasty.  4.  Open reduction internal fixation right wrist.  5.  History of right shoulder surgery.   MEDICATIONS:  Aleve and Tylenol.   SOCIAL HISTORY:  Married.  Lives with husband.  Occasional alcohol.  No  tobacco.   FAMILY HISTORY:  No end-stage renal disease.  Maternal grandmother had liver  cancer.   REVIEW OF SYSTEMS:  GENERAL:  She denies fevers, sweats, chills.  Admits to  fatigue and  malaise.  HEENT:  Eyes:  Denies visual complaints, loss of  vision, diplopia, dysarthria.  Ears, nose, mouth, throat:  Decreased  hearing.  No sinusitis or epistaxis.  CARDIOVASCULAR:  No angina.  No edema.  No syncope.  No myocardial infarction or recent cardiac catheterization.  RESPIRATORY:  No cough, wheeze, hemoptysis.  ABDOMINAL:  Vomited x1.  No  diarrhea.  No further abdominal pain since admission to the hospital.  No  EGD or colonoscopy.  UROGENITAL:  She is oliguric with no history of renal  calculi.  No hematuria or frothy foaminess in urine.  NEUROLOGIC:  No  history of CVA, seizures.  No dysphagia, dysphonia, dysarthria, diplopia.  Positive for weakness.  No paresthesias.  MUSCULOSKELETAL:  Positive for  malaise and negative for myalgias and arthralgias.  Positive for DJD with  use of Tylenol and Aleve.  DERMATOLOGIC:  No skin rash.  Some itching.  HEMATOLOGIC/ONCOLOGIC:  No history of cancer, DVT, or pulmonary embolus.   PHYSICAL EXAMINATION:  GENERAL:  Alert and oriented, non-distressed.  VITAL SIGNS:  Blood pressure 110/70, pulse 70, afebrile.  Is and Os +410.  HEENT:  Head and eyes icteric.  Mild pallor.  Pupils round, equal, reactive.  Ears, nose, mouth, throat:  JVP not elevated.  Supple.  Mucous membranes  moist.  CVP 8.  NECK:  No meningismus.  No carotid bruits.  CARDIOVASCULAR:  No heaves, thrills, rubs, or gallops.  Regular rate and  rhythm.  RESPIRATORY:  Lung fields are clear to auscultation.  No wheeze or rales.  ABDOMEN:  Soft, nontender.  Bowel sounds present.  EXTREMITIES:  No edema.  Pulses 2+ right and left.  DERMATOLOGIC:  She had spider angiomata.   LABORATORIES:  Percent desaturation 62%.  Ceruloplasmin negative.  Cortisol  30.  Sodium 127, potassium 4.9, chloride 92, CO2 23, BUN 21, creatinine 3.7,  glucose 152.  WBC 6.7, hemoglobin 9.9, platelets 146.  AST 4881, ALT 672,  albumin 2.6, calcium 6.4, bilirubin 6.7.  Chest CT and abdominal CT  negative.   Echocardiogram negative.   ASSESSMENT/PLAN:  1.  Acute renal failure probably secondary to acute tubular necrosis in      setting of nonsteroidal anti-inflammatory drug use and intravenous      contrast.  Serum creatinine increased to 3.7.  She is oliguric.  There      is no urgent indications for dialysis at this present time.  2.  Hypertension, volume.  Would continue intravenous fluids at current      rate.  I believe she is still a little mildly volume depleted.  3.  Acidosis secondary to lactic acidosis secondary to shock, possible bowel      ischemia, now improved.  4.  Abnormal liver enzymes.  She appears to have acute on chronic liver      disease secondary to shock.  The chronic liver disease could involve      hemachromatosis or hepatosteatosis from chronic alcohol ingestion and      obesity.      Garnetta Buddy, M.D.  Electronically Signed     MWW/MEDQ  D:  03/07/2005  T:  03/07/2005  Job:  [id number]

## 2010-06-22 NOTE — Op Note (Signed)
[facility]  Patient:    [name], THORESON Visit Number: [id number] MRN: [id number]          Service Type: SUR Location: 1S X002 01 Attending Physician:  Skip Mayer Dictated by:   Georges Lynch Darrelyn Hillock, M.D. Proc. Date: 04/15/01 Admit Date:  04/15/2001                             Operative Report  SURGEON:  Windy Fast A. Darrelyn Hillock, M.D.  ASSISTANT:  Illene Labrador. Aplington, M.D.  PREOPERATIVE DIAGNOSIS:  Posterior dislocating left total hip.  POSTOPERATIVE DIAGNOSIS:  Posterior dislocating left total hip.  OPERATION:  Revision of the polyethylene lining of her left hip and replacement with a constrained liner.  The liner measured 54 mm in diameter and the size head used was a plus 10 C-tapered head, 22 mm in diameter.  DESCRIPTION OF PROCEDURE:  Under general anesthesia, a routine orthopedic prep and draping of the left hip was carried out.  The patient was given 1 g of IV Ancef.  At this time, an incision was made through the old incision site. Great care was taken not to injure the sciatic nerve.  I then went down and made the incision through the iliotibial band.  Self-retaining retractors were inserted.  I detached the external rotators, went down and identified a fluid pocket.  The fluid was cultured for aerobic and anaerobic and also Gram stain was taken STAT.  The Gram stain was negative.  There were no signs of any infection.  It was clear serous fluid.  Following this, I identified the total hip.  I dislocated the hip, removed the C-tapered head and displaced the proximal femur medially and went down and removed all of the soft tissue surrounding the cup, and the plastic polyethylene liner then was removed. Following this, we then irrigated out the area, curetted the rim of the metal acetabular cup to make sure the rim was free, and we then snapped in the 54 mm constrained cup.  We did test the cup; it was solid.  It was fixed and was not  movable, and we then snapped on a 22 mm diameter C-tapered head plus 10 and then reduced it nicely into the constrained cup, took the hip through motion; the hip was stable.  We thoroughly irrigated out the wound.  I then closed the wound in layers in the usual fashion.  Sterile Neosporin dressing was applied.  The patient left the operating room in satisfactory condition with a knee immobilizer in place on the left. Dictated by:   Georges Lynch Darrelyn Hillock, M.D. Attending Physician:  Skip Mayer DD:  04/15/01 TD:  04/15/01 Job: [id number]

## 2010-06-22 NOTE — Op Note (Signed)
[facility]  Patient:    [name], [name]                      MRN: [id number] Proc. Date: 08/26/00 Attending:  Georges Lynch. Darrelyn Hillock, M.D.                           Operative Report  PREOPERATIVE DIAGNOSIS:  Severe degenerative arthritis of the right hip.  POSTOPERATIVE DIAGNOSIS:  Severe degenerative arthritis of the right hip.  OPERATION:  Right total hip arthroplasty utilizing the Osteonics system, and no cement was used.  This was a porous-coated prosthesis.  The sizes used was a 54 mm PSL micro-structured cup.  We utilized a size 8 secure fit plus femoral component.  We utilized a 10 degree series two polyethylene insert. We utilized three screws to screw the cancellous screws to screw the acetabular shell in place.  We also utilized a plus 5 C-tapered head.  SURGEON:  Georges Lynch. Darrelyn Hillock, M.D.  ASSISTANT:  Kerrin Champagne, M.D.  DESCRIPTION OF PROCEDURE:  Under general anesthesia, a routine orthopedic prep and draping was carried out of the right hip was carried out.  At this time, the patient had 1 gram of IV Ancef.  A posterolateral approach to the hip was carried out.  Bleeders identified and cauterized.  I then inserted a self-retaining retractor.  I then incised the iliotibial band and dissected the gluteal muscle by blunt dissection.  Great care was taken not to injure the underlying sciatic nerve.  Following this, the Charnley retractors were inserted.  I then excised the greater trochanteric bursa.  I detached the external rotators.  I preserved the gluteus medius.  I then went down and opened the capsule, debrided the capsule, and dislocated the hip and then amputated the femoral head at the appropriate neck length.  We then inserted a guidepin, made our initial drill hole into the femoral shaft, and then reamed and rasped the shaft up to a size 8 femoral stem.  I then reamed the distal tip to the appropriate size.  Following this, we then  went on and further debrided the capsule and debrided the acetabulum.  We then reamed the acetabulum up to a size 52 mm cup.  When we inserted the trials for the acetabulum, the 52 mm trial was too loose.  So I then simply snapped in a 54 mm cup, and we had an excellent fit.  I did not want to do any further reaming, for fear it would break through the walls of the acetabulum.  Thus, we then inserted our trial 54 mm cup and at this particular time, we inserted our liner and went through a trial reduction.  We had excellent position of the cup.  The cup then was marked in regard to the cup position.  We made a cautery mark on the acetabulum for top position.  We elected to use a plus 5 C-tapered head for stability purposes.  Once this was carried out, I then removed the trial components and inserted my permanent 54 mm acetabular shell, and then three screws were utilized to affix the shell.  I then inserted a trial liner again and went through trials again.  We tried first a plus zero and then a plus five C-tapered head.  The plus 5 C-tapered head was the most stable.  We then inserted our permanent cup and went through a range of motion,  and I felt at this time that there was just too much uncovering of the C-tapered head with acute flexion, so I elected to remove that polyethylene liner and then rotate the liner more posteriorly with inserting a new 10 degree liner.  We then went through trials again and had excellent stability. We thoroughly irrigated out the area.  We irrigated out the femoral shaft and then inserted our permanent size 8 secure fit femoral component.  We went through trials with a plus zero and a plus 5 C-tapered head again, and once again, a plus 5 C-tapered head was the most stable.  Following this, we then inserted our permanent plus 5 C-tapered head, reduced the hip, and had excellent function.  We thoroughly irrigated out the area.  The wound then was closed in  layers in the usual fashion after I reattached the external rotators.  I inserted a Hemovac drain.  The remaining part of the wound was closed in the usual fashion.  The skin was closed with metal staples, and a sterile Neosporin dressing was applied.  She was then placed in a knee immobilizer.  The patient left the operating room in satisfactory condition. She did have one unit of blood during surgery, and she had 1 gram of IV Ancef preop. DD:  08/26/00 TD:  08/26/00 Job: [id number]

## 2010-06-22 NOTE — H&P (Signed)
NAME:  [name], [name] NO.:  [id number]   MEDICAL RECORD NO.:  [id number]          PATIENT TYPE:  EMS   LOCATION:  MAJO                         FACILITY:  [facility]   PHYSICIAN:  Corinna L. Lendell Caprice, MDDATE OF BIRTH:  [birth year]   DATE OF ADMISSION:  03/05/2005  DATE OF DISCHARGE:                                HISTORY & PHYSICAL   CHIEF COMPLAINT:  Shortness of breath and weakness.   HISTORY OF PRESENT ILLNESS:  [name] is a previously healthy 77-year-  old white female who saw her primary care physician in [place] and was  sent to the emergency room for work-up of shortness of breath.  The patient  reports that she has been progressively more fatigued with generalized  malaise over the past month.  It has become much worse over the past week.  She has been dyspneic on exertion.  She has had some vague pain over her  upper abdomen and back.  She vomited today.  Her appetite has been poor  today.  She had no orthopnea.  She denies heart or liver disease.  She  reports occasional alcohol use.  She denies alcohol abuse.  She denies any  jaundice.  She has had a blood transfusion in the past.  She has had no sick  contacts.  She felt real shaky earlier today.  When she stands, she feels as  if she might pass out and has to sit down.   PAST MEDICAL HISTORY:  Significant for osteoarthritis with bilateral knee  arthroplasty and total left hip arthroplasty x2.   MEDICATIONS:  She has taken Tylenol about 3 times a day over the past 3  days.  She took Aleve today, and otherwise no medications or herbal  supplements.   ALLERGIES:  She has a GI intolerance to DARVOCET.   SOCIAL HISTORY:  She is married.  She does not smoke.  She drinks  occasionally, but denies alcohol abuse.   FAMILY HISTORY:  Her father died of unknown cancer.  Her mother died of  sepsis.  Her mother also had heart problems.  Her brother had a heart attack  in his 50s.   REVIEW OF SYSTEMS:   CONSTITUTIONAL:  No fevers.  No weight loss or gain.  HEENT:  No headache or sore throat.  RESPIRATORY:  No cough.  Dyspnea on  exertion as above.  CARDIOVASCULAR:  No palpitations or chest pain.  GI:  As  above.  GU:  No dysuria.  MUSCULOSKELETAL:  She denies myalgias or  arthralgias.  SKIN:  As above.  HEMATOLOGIC:  Denies history of anemia,  bleeding or thromboses.  ENDOCRINE:  No diabetes.  NEUROLOGIC:  No history  of stroke or seizure.  PSYCHIATRIC:  No depression.   PHYSICAL EXAMINATION:  VITAL SIGNS:  Temperature is 95.7, blood pressure  118/59, pulse 108, respiratory rate 20, oxygen saturation 99%.  GENERAL:  The patient is in no acute distress.  HEENT:  Normocephalic and atraumatic.  Pupils equal, round and reactive to  light.  She does have scleral icterus.  She has dry mucous membranes.  NECK:  Supple.  No lymphadenopathy or thyromegaly.  No carotid bruits.  LUNGS:  Clear to auscultation bilaterally without wheezes, rhonchi or rales.  CARDIOVASCULAR:  Regular rate and rhythm without murmurs, gallops, or rubs.  BREAST EXAM:  Without any lumps or skin changes.  ABDOMEN:  Soft.  She does have hepatomegaly with liver edge about 6 cm below  the costal margin.  No splenomegaly can be palpated.  GU/RECTAL:  Deferred.  EXTREMITIES:  No clubbing, cyanosis, or edema.  SKIN:  Somewhat dry.  No rash.  She is jaundiced.  NEUROLOGIC:  Alert and oriented.  Cranial nerves and sensorimotor exam are  intact.  PSYCHIATRIC:  Normal affect.   LABORATORY DATA:  Venous pH is 7.036 with a bicarbonate of 8, base deficit  of 22.  White blood cell count is 12.9 with 86% neutrophils, 10%  lymphocytes.  Hemoglobin is 12.7, hematocrit 38.6.  MCV is 114, platelet  count 167.  Peripheral smear shows __________ Archie Balboa bodies, Pappenheimer  bodies, and rare nucleated red blood cells.  Sodium is 138, potassium 4.3,  chloride 105, glucose 47, bicarbonate 8, BUN 11, creatinine 1.0.  Total  bilirubin is 7.4.   Direct bilirubin is 4.2.  Indirect bilirubin is 3.2.  Alkaline phosphatase is 163.  AST is 1675.  ALT is 243.  Albumin is 3.1.  Total protein 6.4.  Amylase and lipase are normal.  Myoglobin greater than  500.  Troponin and CPK MB are negative.  B type N. peptide is 201.  UA  reveals a specific gravity of 1.015, negative glucose, small bilirubin,  negative ketones, small blood, 100 protein, negative nitrates, negative  leukocyte esterase.  Granular and amorphous casts.  EKG was reportedly done,  but is not on the chart.  Chest x-ray shows no active lung disease, mild  cardiomegaly.  Wet reading of the CT of the chest, abdomen and pelvis shows  no pulmonary embolus and bibasilar atelectasis, severe fatty infiltration of  the liver, mild gallbladder wall thickening, nonspecific duodenal wall  thickening with adjacent inflammatory changes; inflammatory disease is  suggested.   ASSESSMENT AND PLAN:  1.  Jaundice with increased direct and indirect bilirubin and elevated      transaminases.  The patient viamently denies alcohol abuse.  She does      have gallbladder wall thickening.  I will order a right upper quadrant      ultrasound.  I will also order an acute hepatitis screen, ANA,      erythrocyte sedimentation rate, ferritin, PT, PTT.  2.  Hypoglycemia, most likely secondary to not eating and possibly the liver      disease.  3.  Increased anion gap acidosis.  I will check a lactate level.  Consider      starvation ketosis, though she has no ketones in her urine.  4.  Weakness and dyspnea, most likely multifactorial, but I will check a TSH      and an echocardiogram.  5.  Duodenal inflammation by CT scan.  She may need a GI consult for both      this and the jaundice.  I will order Protonix.  6.  Abnormal peripheral smear with __________ Archie Balboa bodies and sideroblasts,      as well as macrocytosis.  Will check a B-12, folate, but I suspect this     is secondary to her liver progress.  7.   I will also ask that the patient get sequential compression devices, and  I will ask that the EKG be placed on her chart for review.  She is in      some pain, and I will give p.r.n. morphine.  For now, I will give her      ice chips only.  After the ultrasound, if no procedures are planned, she      can be started on a diet.      Corinna L. Lendell Caprice, MD  Electronically Signed     CLS/MEDQ  D:  03/06/2005  T:  03/06/2005  Job:  [id number]

## 2010-06-22 NOTE — H&P (Signed)
[place]. [facility]  Patient:    [name], [name] Visit Number: [id number] MRN: [id number]          Service Type: [facility] Location: [id number] Attending Physician:  Faith Rogue T Dictated by:   Dorie Rank, P.A. Admit Date:  08/29/2000 Discharge Date: 09/02/2000   CC:         Dr. Hilton [place]   History and Physical  CHIEF COMPLAINT:  Left hip pain.  HISTORY OF PRESENT ILLNESS:  Ms. [name] is a pleasant 77 year old female who has had ongoing left hip pain despite conservative treatment.  She had a radiograph in the office on October 10, 2000, which revealed an almost-complete collapse of the left femoral head.  Due to the diagnostic studies as well as the interference with her activities of daily living, it was felt that she would benefit from undergoing a left total hip arthroplasty. The procedure, risks, and benefits were discussed with the patient, and she agreed to proceed.  MEDICATIONS:  Vioxx p.r.n., Aleve p.r.n., Tylenol p.r.n.  ALLERGIES:  DARVOCET causes nausea.  No known drug allergies.  PAST MEDICAL HISTORY:  History of insomnia, osteoarthritis of the bilateral hips.  She had a right total hip arthroplasty in July 2002.  She also has osteoarthritis of the lumbar spine.  Her family medical doctor is Dr. August Saucer in [facility].  PAST SURGICAL HISTORY:  She had a cataract removal of the right eye in the distant past.  ORIF of the right wrist in 1997, ORIF to the right humerus 1999, right total hip arthroplasty in July 2002.  SOCIAL HISTORY:  The patient denies any alcohol or tobacco use.  She is married.  She lives in a one-level home with two steps into the entrance.  She would like to go to rehab following her stay in the hospital due to the fact that her husband can only provide minimal assistance at home due to his medical problems.  FAMILY HISTORY:  Mother deceased, age 2.  Had a history of lung cancer, myocardial  infarct.  Father deceased in his mid-70s, history of heart disease and some type of cancer.  She is unsure of what type it was.  REVIEW OF SYSTEMS:  GENERAL:  No fevers, chills, night sweats, or bleeding tendencies.  PULMONARY:  No shortness of breath, productive cough, hemoptysis. CARDIOVASCULAR:  No chest pain, angina, or orthopnea.  ENDOCRINE:  No hypothyroidism or hyperthyroidism.  No history of diabetes mellitus. NEUROLOGIC:  No headaches, seizures, or paralysis.  No blurred vision.  No diplopia.  GASTROINTESTINAL:  No constipation, diarrhea, melena, nausea, or vomiting.  GENITOURINARY:  No hematuria, dysuria, or discharge.  PHYSICAL EXAMINATION:  VITAL SIGNS:  Pulse 78, respirations 24, blood pressure 160/70.  GENERAL:  an alert and oriented x 63 77 year old white female, well-developed, well-nourished.  HEENT:  Head is atraumatic, normocephalic.  PERRL.  Oropharynx is clear.  NECK:  Supple.  Negative for carotid bruits bilaterally.  Negative for cervical lymphadenopathy.  CHEST:  Lungs are clear to auscultation bilaterally.  No wheezes, rhonchi, or rales.  BREASTS:  Not pertinent to present illness.  CARDIAC:  S1, S1.  Negative for murmur, gallop, or rub.  Heart is regular in rate and rhythm.  ABDOMEN:  Soft, nontender, positive bowel sounds in all four quadrants.  GENITOURINARY:  Not pertinent to present illness.  EXTREMITIES:  Some shortening of the left leg.  She has pain with range of motion to the left hip.  She has  decreased range of motion in the left hip. She walks with an antalgic gait.  SKIN:  Acyanotic.  No rashes or lesions appreciated on exam.  She does have a well-healed scar to the right hip.  DIAGNOSTIC STUDIES:  Labs and x-rays are pending at this time.  IMPRESSION:  Osteoarthritis to the left hip.  PLAN:  The patient was scheduled for a left total hip arthroplasty by Dr. Ranee Gosselin. Dictated by:   Dorie Rank, P.A. Attending Physician:   Faith Rogue T DD:  11/27/00 TD:  11/30/00 Job: [id number]

## 2010-06-22 NOTE — Discharge Summary (Signed)
[place]. [facility]  Patient:    [name], [name] Visit Number: [id number] MRN: [id number]          Service Type: SUR Location: [id number] Attending Physician:  Skip Mayer Dictated by:   Della Goo, P.A. Admit Date:  12/04/2000 Disc. Date: 12/08/00                             Discharge Summary  ADMISSION DIAGNOSES: 1. Osteoarthritis left hip. 2. Status post right total hip replacement June 2002.  DISCHARGE DIAGNOSES: 1. Osteoarthritis left hip. 2. Status post right total hip replacement June 2002. 3. Post hemorrhagic anemia.  PROCEDURE:  On December 04, 2000 patient underwent left total hip arthroplasty, porous coated noncemented prosthesis, performed by Dr. Georges Lynch. Gioffre assisted by Dr. Michael Litter. Carter and Alexzandrew L. Perkins, P.A.-C. under general anesthesia.  CONSULTATIONS:  Dr. Rande Brunt. [facility] of physical medicine and rehabilitation.  BRIEF HISTORY:  Patient is a 77 year old white female status post right total hip arthroplasty June 2002.  She is now having pain and dysfunction of the left hip secondary to end-stage osteoarthritis.  X-rays have revealed almost complete collapse of the left femoral head.  It was felt she would require surgical intervention and was admitted for the procedure as stated above.  BRIEF HOSPITAL COURSE:  Patient tolerated the procedure without difficulty. Postoperatively, neurovascular motor function was noted to be intact.  She did have some erythema around the wound that was noted postoperatively and resolved during the hospital stay.  Patient was started on PCA analgesics initially and weaned to p.o. analgesics without difficulty.  She was placed on heparin and Coumadin protocol for DVT and PE prophylaxis.  Adjustments in her doses were made by daily protimes by the pharmacist at [facility]. Incentive spirometry was encouraged as patient had some mild, low-grade fevers during  the hospital stay which were felt to be related to atelectasis. Patient was started on ambulation and gait training, as well as range of motion and strengthening exercises, and total hip replacement precautions by the physical therapist.  She was slow for her activity level and a rehab consult was obtained.  She was felt to be a suitable candidate for inpatient rehab and was placed on the bed waiting list.  While awaiting a bed she continued with touchdown weightbearing ambulation and tolerated this well. Neurovascular motor function remained intact in the lower extremity throughout the hospital stay.  Wound continued to be clean and dry.  Hemoglobin dropped postoperatively to the lowest value of 8.6 on December 07, 2000.  Patient did not have a transfusion during the hospital stay.  OTHER PERTINENT LABORATORY VALUES:  The patients chemistry studies were normal on admission and on repeat during the hospital stay.  Urinalysis on admission was negative for urinary tract infection.  Coagulation studies on admission were normal and at the time of discharge showed a protime 15.7, INR 1.3 while on Coumadin.  Chest x-ray on admission showed no evidence of active disease in the chest.  EKG on admission showed normal sinus rhythm, normal EKG.  PLAN:  Patient is being transferred to the inpatient rehab.  There, she will continue with physical therapy as well as occupational therapy.  She will undergo training for range of motion and strengthening exercises as well as ambulation.  ACTIVITY:  She will be touchdown weightbearing on the operative extremity. She should undergo strict total hip replacement precautions.  She will also be taught ADLs.  WOUND CARE:  Dressing change to be done daily.  Staples removed two weeks from the date of surgery.  MEDICATIONS:  She will continue on Coumadin for DVT and PE prophylaxis for four weeks postoperatively.  Her other medications include: 1. Colace one  p.o. b.i.d. 2. Trinsicon one p.o. t.i.d. 3. Percocet one to two q.4-6h. as needed for pain. 4. Robaxin 500 mg one q.8h. as needed for spasm. 5. Restoril 15 to 30 mg q.h.s. p.r.n. sleep.  She has also received other p.r.n. medications including Phenergan, Tylenol, and Dulcolax.  FOLLOW-UP:  Patient will follow up with Dr. Darrelyn Hillock after her stay in the rehab center.  DIET:  She should continue on a regular diet.  CONDITION ON DISCHARGE:  Stable. Dictated by:   Della Goo, P.A. Attending Physician:  Skip Mayer DD:  12/08/00 TD:  12/08/00 Job: [id number]

## 2010-06-22 NOTE — H&P (Signed)
[facility]  Patient:    [name], BLANCHARD                      MRN: [id number] Adm. Date:  08/26/00 Attending:  Georges Lynch. Darrelyn Hillock, M.D. Dictator:   Della Goo, P.A.                         History and Physical  CHIEF COMPLAINT:  Bilateral hip pain, right greater than left.  HISTORY OF PRESENT ILLNESS:  Patient is a 77 year old white female with chronic and progressive bilateral hip pain.  Over the past several months she has had increasing discomfort of the right hip, which is causing her to have limitations with her activity.  She has gotten no relief with conservative treatment, including anti-inflammatory medications and analgesics.  She is having difficulty walking even short distances.  Examination reveals pain with range of motion of both hips.  She walks with an antalgic gait.  X-rays reviewed show severe degenerative arthritis of both hips with near-complete collapse of the joint space of the right hip.  Due to her continued symptoms as well as significant findings on x-ray, it is felt that she will require surgical intervention.  She is being admitted at this time to undergo a right total hip arthroplasty.  ALLERGIES:  No known drug allergies.  The patient does relate that DARVOCET causes her nausea.  MEDICATIONS:  Vicodin as needed for severe pain, Tylenol as needed for mild pain.  PAST MEDICAL HISTORY:  Patient states she has been very healthy without chronic illnesses.  She does suffer from insomnia and has been unsuccessful in finding a sleep agent that is helpful for her.  PAST SURGICAL HISTORY:  The pinning of her right wrist many years ago.  She has also had open reduction and internal fixation of a right proximal humerus fracture in 2002.  The patient has had a cataract removed from the right eye.  SOCIAL HISTORY:  Patients family medical doctor is Dr. August Saucer at [facility]. She has no intake of tobacco or alcohol.  She lives  with her husband in a one-level home that has two steps going into the usual entrance.  FAMILY HISTORY:  Significant for lung cancer and heart disease.  REVIEW OF SYSTEMS:  CENTRAL NERVOUS SYSTEM:  The patient denies blurred vision, double vision, seizure disorder, headaches, or paralysis.  She states she does have a cataract on her left eye; however, it is not ready at this time to have removed.  It is being monitored by her ophthalmologist. CARDIORESPIRATORY:  No chest pain, shortness of breath, cough, sputum production, or hemoptysis.  GENITOURINARY/GASTROINTESTINAL:  No nausea, vomiting, diarrhea, or constipation.  No dysuria, hematuria, melena, or bloody stools.  MUSCULOSKELETAL:  As per history of present illness.  HEMATOLOGIC: Patient denies history of anemia, bleeding disorders, blood clots, jaundice, or hepatitis.  PHYSICAL EXAMINATION:  VITAL SIGNS:  Pulse 78 and regular, blood pressure 144/88.  GENERAL:  She is a well-developed, well-nourished white female, alert and oriented x 4, in no acute distress.  HEENT:  Normocephalic, atraumatic.  Pupils equal, round and reactive to light and accommodation.  Extraocular movements intact.  Nose without drainage. Oropharynx without edema or erythema.  NECK:  Supple, no adenopathy or thyromegaly.  CHEST:  Lungs clear to auscultation.  CARDIAC:  Regular rate and rhythm, no murmur heard.  ABDOMEN:  Soft, nontender.  Bowel sounds present.  GENITAL, RECTAL, BREASTS:  Not performed, not pertinent to present illness.  EXTREMITIES:  Range of motion of both hips is limited secondary to pain. Distal pulses are +2 at dorsalis pedis bilaterally.  There is no cyanosis, clubbing, or edema of the lower extremities.  IMPRESSION: 1. End-stage degenerative arthritis of the right hip. 2. Insomnia.  PLAN:  The patient is being admitted to undergo right total hip arthroplasty. She will be seen by Dr. August Saucer, her family medical physician, on  August 25, 2000, for medical clearance.  Her plan is to return home postoperatively with home health physical therapy. DD:  08/21/00 TD:  08/21/00 Job: [id number]

## 2010-06-22 NOTE — Discharge Summary (Signed)
NAME:  [name], [name]             ACCOUNT NO.:  [id number]   MEDICAL RECORD NO.:  [id number]          PATIENT TYPE:  INP   LOCATION:  5153                         FACILITY:  [facility]   PHYSICIAN:  Corinna L. Lendell Caprice, MDDATE OF BIRTH:  [birth year]   DATE OF ADMISSION:  03/05/2005  DATE OF DISCHARGE:  03/14/2005                                 DISCHARGE SUMMARY   DISCHARGE DIAGNOSES:  1.  Acute cholestatic jaundice with hepatocellular injury: hemachromatosis      laboratories pending.  2.  Resolved acute renal failure.  3.  Hepatic encephalopathy,  4.  Gastritis; no inflammation on biopsy.  5.  Coagulopathy.  6.  Osteoarthritis.   DISCHARGE MEDICATIONS:  Protonix 40 mg a day.   DIET:  Regular.   DISCHARGE INSTRUCTIONS:  The patient is not to drink alcohol or take  Tylenol.   ACTIVITY:  Activity is ad lib.   DISCHARGE CONDITION:  The patient's condition is stable.   CONSULTATIONS:  1.  Garnetta Buddy, M.D.  2.  Lina Sar, M.D.  3.  Danice Goltz, M.D.   PROCEDURE:  Esophagogastroduodenoscopy; this showed portal hypertensive  gastropathy with no active bleeding and no varices  biopsy showed no villous  atrophy, active inflammation or other significant changes identified.   LABORATORY DATA:  Pertinent laboratories:  On admission MCV 114, hemoglobin  12.7, hematocrit 38.6 and platelet count 167,000.  Peripheral smear showed  Howell-jolly bodies, Pappenheimer bodies and rare nucleated blood cells.  Sodium 138, potassium 4.3, chloride 105, glucose 47, bicarbonate 8, BUN 11,  and creatinine 1.  Total bilirubin 7.4, direct bilirubin 4.2, indirect  bilirubin 3.2. __________  lipase.  Myoglobin greater than 500.  Negative  CPK, MB and troponin.  B-natriuretic peptide 201. UA showed a specific  gravity of 1.015, negative glucose, small bilirubin, negative ketones, small  blood, high protein, negative nitrites, negative leukocyte esterase, and  granular and amorphous casts.  ABGs  are pending at this time.  ANA negative.  Epstein-Barr virus negative. CMV titer is pending.  Helicobacter biopsy  negative.  Acetaminophen level was less than 10.  Salicylate level 12.  Serial plasmin 31.  Hepatitis B surface antigen negative.  Hepatitis C  antibody negative.  Antihepatitis A IgM negative.  Iron was 197, TIBC 14,  percent saturation 63, UIBC 117, B12 greater than 2,000, folate 17, and  ferritin 14,000.  TSH 1.3. Hemoglobin A-1-C was 5.3.  Carotene free and  total were 131, 07.  Lactic acid level was 15.  Her total bilirubin peaked  at 10.  Her AST peaked at 4,881, ALT peaked at 5.4.  She had a potassium of  6.1, then initially was 3.1 and at discharge was 2.0.  INR on admission was  3.1.  PTT 53.  At discharge her INR was 17.  ABGs on March 06, 2005 on 2  liters of oxygen revealed a pH of __________ .   Diagnostic studies and radiology:  EKG showed normal sinus rhythm.  Chest x-  ray showed mild cardiomegaly.  CT angiogram of the chest showed no pulmonary  embolus, but bibasilar atelectasis.  CT of the abdomen and pelvis showed  severe diffuse fatty infiltration of the liver, duodenal fold thickening  adjacent to a fluid density in the retroperitoneum suggesting an adjacent  and/or inflammatory disease, mild gallbladder wall thickening, and uterine  fibroids.  Ultrasound of the abdomen showed heterogenous and dense liver  corresponding to fatty infiltration.  Aorta and pancreas were not well-  visualized.  She had several other chest x-rays, which were negative.  Echocardiogram showed normal focal basilar septal hypertrophy, mildly  increased valve thickness.   HISTORY OF THE PRESENT ILLNESS:  Ms. [name]  is an unassigned 77 year old  with previous __________  that had been going on for about a month and much  worse over the week prior to admission.  She gives a history of liver  disease.  She reports occasional alcohol use, but vehemently denies alcohol   abuse.   PHYSICAL EXAMINATION:  VITAL SIGNS:  On physical examination in the  emergency room her temperature is 95.7, blood pressure 118/59, pulse 108,  respiratory rate 20, and oxygen saturation 99%.  HEENT:  The patient has scleral icterus and is jaundiced.  ABDOMEN:  The patient has hepatomegaly.   HOSPITAL COURSE:  GI was consulted.  The patient's renal function worsened  and she became oliguric.  Gastroenterology, nephrology and critical care  were consulted.  At this point the cause of her acute jaundice and  hepatocellular injury were somewhat in question.  There was concern about  the high ferritin; and, her hemachromatosis labs would need to be updated.  There was also concern that her pressure may have dropped and she had  shocked liver as well as acute tubular necrosis.  She may end up needing  liver biopsy if the diagnosis is uncertain.  There was also some question as  to whether the patient drinks more alcohol than she would admit.   The patient was suspected to have underlying chronic liver disease as well  given all the findings. Her lactic acidosis was felt to be due too the acute  illness and not any bowel ischemia or surgical problem.  She had no  abdominal tenderness.  She was felt by nephrology to have renal failure most  likely secondary to acute tubular necrosis in the setting of IV contrast  from CAT scan as well as some nonsteroidal use as an outpatient, as well as  volume depletion.  She did not require dialysis and her creatinine improved  to near normal.  She also had a coagulopathy secondary to the liver disease  that improved.  She had no bleeding.  The patient also developed some  confusion and was found to have an elevated ammonia level, and was felt to  have hepatic encephalopathy.  She also had some hyponatremia, which was felt  to be secondary to the liver disease.   The patient's acidosis improved.  She was on BiPAP for a while as recommended by Dr.  Jayme Cloud to help compensate for her acidosis.   At the time of discharge she was still jaundiced; however, her transaminases  had decreased tremendously.  She was feeling much stronger.  She had no  shortness of breath.  The patient was tolerating a diet and ambulating well.  She did have a cough the day prior to discharge, but the chest x-ray was  negative.  The cough was nonproductive and I suspect this is a viral upper  respiratory infection.  She also complained of some incontinence after her  Foley was  discontinued, but her UA was negative.  Her EGD did show some  gastritis visually, but the biopsies were negative of inflammation.  Nevertheless, I am sending her home on Protonix.  This can be stopped by GI  if needed.  I have  also continued the lactulose, which I suspect will be able to be stopped as  well.  She has no signs of encephalopathy and all her lab work other than  the liver enzymes have improved.   Total time on the day of discharge was 45 minutes.      Corinna L. Lendell Caprice, MD  Electronically Signed     CLS/MEDQ  D:  03/14/2005  T:  03/15/2005  Job:  [id number]   cc:   Lina Sar, M.D. [facility]  [address]  [place]  [place] [address]   Garnetta Buddy, M.D.  Fax: [phone]

## 2014-06-20 ENCOUNTER — Telehealth: Payer: Self-pay | Admitting: Family Medicine

## 2014-06-20 NOTE — Telephone Encounter (Signed)
Called to let pt know about being accepted into practice and to set up an apt with Dr Pickard °

## 2014-06-23 ENCOUNTER — Encounter: Payer: Self-pay | Admitting: *Deleted

## 2014-07-12 ENCOUNTER — Encounter: Payer: Self-pay | Admitting: Family Medicine

## 2014-07-12 ENCOUNTER — Ambulatory Visit (INDEPENDENT_AMBULATORY_CARE_PROVIDER_SITE_OTHER): Payer: Medicare HMO | Admitting: Family Medicine

## 2014-07-12 VITALS — BP 140/86 | HR 76 | Temp 97.7°F | Resp 18 | Ht 63.0 in | Wt 169.0 lb

## 2014-07-12 DIAGNOSIS — Z1231 Encounter for screening mammogram for malignant neoplasm of breast: Secondary | ICD-10-CM

## 2014-07-12 DIAGNOSIS — Z7189 Other specified counseling: Secondary | ICD-10-CM | POA: Diagnosis not present

## 2014-07-12 DIAGNOSIS — K760 Fatty (change of) liver, not elsewhere classified: Secondary | ICD-10-CM | POA: Insufficient documentation

## 2014-07-12 DIAGNOSIS — Z7689 Persons encountering health services in other specified circumstances: Secondary | ICD-10-CM

## 2014-07-12 DIAGNOSIS — M545 Low back pain, unspecified: Secondary | ICD-10-CM

## 2014-07-12 DIAGNOSIS — Z23 Encounter for immunization: Secondary | ICD-10-CM

## 2014-07-12 NOTE — Addendum Note (Signed)
Addended by: Legrand RamsWILLIS, Kyian Obst B on: 07/12/2014 04:11 PM   Modules accepted: Orders

## 2014-07-12 NOTE — Progress Notes (Signed)
Subjective:    Patient ID: [name], female    DOB: 03/05/1931, 81 y.o.   MRN: [id number]  HPI  patient is here today to establish care. She is due for mammogram. She is due for a bone density. She refuses a colonoscopy. She is due for  Pneumovax 23. She also reports 6 months of daily pain in her lumbar and thoracic spine areas. It is worse with prolonged standing and working. It occurs on a daily basis. She is also overdue for fasting lab work. Otherwise she is doing well with no concerns. Past Medical History  Diagnosis Date  . Cataract   . Fatty liver disease, nonalcoholic     2007- admitted with encephalopathy unsure of cause, resolved sponatneously   Past Surgical History  Procedure Laterality Date  . Joint replacement      HIP  . Fracture surgery      right shoulder, both wrists    No current outpatient prescriptions on file prior to visit.   No current facility-administered medications on file prior to visit.   No Known Allergies History   Social History  . Marital Status: Married    Spouse Name: N/A  . Number of Children: N/A  . Years of Education: N/A   Occupational History  . Not on file.   Social History Main Topics  . Smoking status: Never Smoker   . Smokeless tobacco: Never Used  . Alcohol Use: No  . Drug Use: No  . Sexual Activity: Not Currently     Comment: raised tobacco, retired   Other Topics Concern  . Not on file   Social History Narrative   Family History  Problem Relation Age of Onset  . Heart disease Mother   . Cancer Father     metastatic unsure of primary  . Early death Brother     died at 714 y/o      Review of Systems  All other systems reviewed and are negative.      Objective:   Physical Exam  Constitutional: She is oriented to person, place, and time. She appears well-developed and well-nourished. No distress.  HENT:  Head: Normocephalic and atraumatic.  Right Ear: External ear normal.  Left Ear: External ear  normal.  Nose: Nose normal.  Mouth/Throat: Oropharynx is clear and moist. No oropharyngeal exudate.  Eyes: Conjunctivae and EOM are normal. Pupils are equal, round, and reactive to light. Right eye exhibits no discharge. Left eye exhibits no discharge. No scleral icterus.  Neck: Normal range of motion. Neck supple. No JVD present. No tracheal deviation present. No thyromegaly present.  Cardiovascular: Normal rate, regular rhythm, normal heart sounds and intact distal pulses.  Exam reveals no gallop and no friction rub.   No murmur heard. Pulmonary/Chest: Effort normal and breath sounds normal. No stridor. No respiratory distress. She has no wheezes. She has no rales. She exhibits no tenderness.  Abdominal: Soft. Bowel sounds are normal. She exhibits no distension and no mass. There is no tenderness. There is no rebound and no guarding.  Musculoskeletal: Normal range of motion. She exhibits no edema or tenderness.  Lymphadenopathy:    She has no cervical adenopathy.  Neurological: She is alert and oriented to person, place, and time. She has normal reflexes. She displays normal reflexes. No cranial nerve deficit. She exhibits normal muscle tone. Coordination normal.  Skin: Skin is warm. No rash noted. She is not diaphoretic. No erythema. No pallor.  Psychiatric: She has a normal mood  and affect. Her behavior is normal. Judgment and thought content normal.  Vitals reviewed.         Assessment & Plan:  Midline low back pain without sciatica - Plan: DG Thoracic Spine W/Swimmers, DG Lumbar Spine Complete  Establishing care with new doctor, encounter for   I will schedule the patient for mammogram. I'll schedule her for a bone density test. She refuses a colonoscopy. She also received Pneumovax 23 today in clinic. I would like her to go to get x-rays of her lumbar and thoracic spin to evaluate for possible causes of her mid and low back pain. I have also asked the patient to come in fasting for  a CBC, CMP, fasting lipid panel.

## 2014-07-14 ENCOUNTER — Other Ambulatory Visit: Payer: Medicare HMO

## 2014-07-14 DIAGNOSIS — Z0000 Encounter for general adult medical examination without abnormal findings: Secondary | ICD-10-CM

## 2014-07-14 DIAGNOSIS — K760 Fatty (change of) liver, not elsewhere classified: Secondary | ICD-10-CM

## 2014-07-14 LAB — COMPLETE METABOLIC PANEL WITH GFR
ALT: 15 U/L (ref 0–35)
AST: 19 U/L (ref 0–37)
Albumin: 4.1 g/dL (ref 3.5–5.2)
Alkaline Phosphatase: 56 U/L (ref 39–117)
BUN: 17 mg/dL (ref 6–23)
CHLORIDE: 104 meq/L (ref 96–112)
CO2: 27 meq/L (ref 19–32)
Calcium: 9.7 mg/dL (ref 8.4–10.5)
Creat: 0.85 mg/dL (ref 0.50–1.10)
GFR, EST AFRICAN AMERICAN: 75 mL/min
GFR, EST NON AFRICAN AMERICAN: 65 mL/min
Glucose, Bld: 96 mg/dL (ref 70–99)
Potassium: 4.4 mEq/L (ref 3.5–5.3)
SODIUM: 139 meq/L (ref 135–145)
TOTAL PROTEIN: 6.8 g/dL (ref 6.0–8.3)
Total Bilirubin: 0.8 mg/dL (ref 0.2–1.2)

## 2014-07-14 LAB — CBC WITH DIFFERENTIAL/PLATELET
BASOS ABS: 0 10*3/uL (ref 0.0–0.1)
Basophils Relative: 0 % (ref 0–1)
EOS ABS: 0.2 10*3/uL (ref 0.0–0.7)
Eosinophils Relative: 3 % (ref 0–5)
HCT: 44 % (ref 36.0–46.0)
HEMOGLOBIN: 14.7 g/dL (ref 12.0–15.0)
LYMPHS ABS: 1.5 10*3/uL (ref 0.7–4.0)
Lymphocytes Relative: 24 % (ref 12–46)
MCH: 31.3 pg (ref 26.0–34.0)
MCHC: 33.4 g/dL (ref 30.0–36.0)
MCV: 93.8 fL (ref 78.0–100.0)
MONOS PCT: 10 % (ref 3–12)
MPV: 10.2 fL (ref 8.6–12.4)
Monocytes Absolute: 0.6 10*3/uL (ref 0.1–1.0)
NEUTROS ABS: 3.8 10*3/uL (ref 1.7–7.7)
NEUTROS PCT: 63 % (ref 43–77)
PLATELETS: 171 10*3/uL (ref 150–400)
RBC: 4.69 MIL/uL (ref 3.87–5.11)
RDW: 13.9 % (ref 11.5–15.5)
WBC: 6.1 10*3/uL (ref 4.0–10.5)

## 2014-07-14 LAB — LIPID PANEL
Cholesterol: 220 mg/dL — ABNORMAL HIGH (ref 0–200)
HDL: 57 mg/dL (ref >=46)
LDL Cholesterol: 142 mg/dL — ABNORMAL HIGH (ref 0–99)
TRIGLYCERIDES: 104 mg/dL (ref <150)
Total CHOL/HDL Ratio: 3.9 Ratio
VLDL: 21 mg/dL (ref 0–40)

## 2014-07-20 ENCOUNTER — Encounter: Payer: Self-pay | Admitting: *Deleted

## 2014-08-09 ENCOUNTER — Ambulatory Visit
Admission: RE | Admit: 2014-08-09 | Discharge: 2014-08-09 | Disposition: A | Discharge status: Home / Self Care | Payer: Self-pay | Source: Ambulatory Visit | Attending: Family Medicine | Admitting: Family Medicine

## 2014-08-09 DIAGNOSIS — M545 Low back pain, unspecified: Secondary | ICD-10-CM

## 2014-08-09 LAB — HM MAMMOGRAPHY: HM Mammogram: NORMAL

## 2014-08-09 LAB — HM DEXA SCAN

## 2014-08-11 ENCOUNTER — Encounter: Payer: Self-pay | Admitting: Family Medicine

## 2014-08-18 ENCOUNTER — Ambulatory Visit (INDEPENDENT_AMBULATORY_CARE_PROVIDER_SITE_OTHER): Payer: Medicare HMO | Admitting: Family Medicine

## 2014-08-18 ENCOUNTER — Encounter: Payer: Self-pay | Admitting: Family Medicine

## 2014-08-18 VITALS — BP 118/70 | HR 62 | Temp 98.2°F | Resp 16 | Ht 63.0 in | Wt 172.0 lb

## 2014-08-18 DIAGNOSIS — M47817 Spondylosis without myelopathy or radiculopathy, lumbosacral region: Secondary | ICD-10-CM | POA: Diagnosis not present

## 2014-08-18 DIAGNOSIS — M4854XA Collapsed vertebra, not elsewhere classified, thoracic region, initial encounter for fracture: Secondary | ICD-10-CM

## 2014-08-18 DIAGNOSIS — S22080A Wedge compression fracture of T11-T12 vertebra, initial encounter for closed fracture: Secondary | ICD-10-CM

## 2014-08-18 MED ORDER — AMITRIPTYLINE HCL 50 MG PO TABS: 50.0000 mg | ORAL_TABLET | Freq: Every evening | ORAL | Status: DC | PRN

## 2014-08-18 NOTE — Progress Notes (Signed)
   Subjective:    Patient ID: [name], female    DOB: 03/20/1931, 81 y.o.   MRN: [id number]  HPI  Please see the patient's last office visit. She is complaining of persistent daily pain in her back. I obtained x-rays of thoracic spine which revealed a remote T12 compression fracture. Patient then obtain a bone density test to evaluate for osteoporosis. In fact her T score in the lumbar spine was outstanding at 0.5. Her T score at the wrist was -1.7. Patient believes she injured that bone when she fell several years ago. She also continues to complain of pain in her lumbar spine. X-rays of the lumbar spine reveal severe multilevel spondylosis. However the patient is experiencing no radiculopathy. She does complain of nightly insomnia she is tried Ambien and Xanax in the past with no relief Past Medical History  Diagnosis Date  . Cataract   . Fatty liver disease, nonalcoholic     2007- admitted with encephalopathy unsure of cause, resolved sponatneously   Past Surgical History  Procedure Laterality Date  . Joint replacement      HIP  . Fracture surgery      right shoulder, both wrists    Current Outpatient Prescriptions on File Prior to Visit  Medication Sig Dispense Refill  . Naproxen Sodium (ALEVE PO) Take by mouth.     No current facility-administered medications on file prior to visit.   Allergies  Allergen Reactions  . Pneumococcal Vaccines Other (See Comments)    Pt had localized reaction to injection of Pneumococcal 23 - Swelling and redness in upper arm that extend to but not beyond elbow   History   Social History  . Marital Status: Married    Spouse Name: N/A  . Number of Children: N/A  . Years of Education: N/A   Occupational History  . Not on file.   Social History Main Topics  . Smoking status: Never Smoker   . Smokeless tobacco: Never Used  . Alcohol Use: No  . Drug Use: No  . Sexual Activity: Not Currently     Comment: raised tobacco, retired    Other Topics Concern  . Not on file   Social History Narrative      Review of Systems  All other systems reviewed and are negative.      Objective:   Physical Exam  Cardiovascular: Normal rate, regular rhythm and normal heart sounds.   Pulmonary/Chest: Effort normal and breath sounds normal.  Musculoskeletal:       Lumbar back: She exhibits decreased range of motion, tenderness and pain. She exhibits no bony tenderness.  Vitals reviewed.         Assessment & Plan:  T12 compression fracture, initial encounter  Spondylosis of lumbosacral region without myelopathy or radiculopathy  patient has back pain related to lumbar spondylosis as well as her T12 compression fracture. At the present time she would like to manage this sparingly using Aleve and Tylenol. The pain worsens we can treat more aggressively. She is not interested in pain medication at the present time. I will treat her insomnia with Elavil 50 mg by mouth daily at bedtime

## 2014-09-13 ENCOUNTER — Encounter: Payer: Self-pay | Admitting: Family Medicine

## 2014-09-13 DIAGNOSIS — M8580 Other specified disorders of bone density and structure, unspecified site: Secondary | ICD-10-CM | POA: Insufficient documentation

## 2015-03-13 DIAGNOSIS — H40153 Residual stage of open-angle glaucoma, bilateral: Secondary | ICD-10-CM | POA: Diagnosis not present

## 2015-04-03 ENCOUNTER — Ambulatory Visit (INDEPENDENT_AMBULATORY_CARE_PROVIDER_SITE_OTHER): Payer: PPO | Admitting: Family Medicine

## 2015-04-03 ENCOUNTER — Encounter: Payer: Self-pay | Admitting: Family Medicine

## 2015-04-03 VITALS — BP 134/80 | HR 100 | Temp 98.0°F | Resp 14 | Ht 63.0 in | Wt 178.0 lb

## 2015-04-03 DIAGNOSIS — G4700 Insomnia, unspecified: Secondary | ICD-10-CM | POA: Diagnosis not present

## 2015-04-03 DIAGNOSIS — M5136 Other intervertebral disc degeneration, lumbar region: Secondary | ICD-10-CM

## 2015-04-03 MED ORDER — ALPRAZOLAM 0.5 MG PO TABS: 0.5000 mg | ORAL_TABLET | Freq: Every day | ORAL | Status: DC

## 2015-04-03 MED ORDER — TRAMADOL HCL 50 MG PO TABS: 50.0000 mg | ORAL_TABLET | Freq: Three times a day (TID) | ORAL | Status: DC | PRN

## 2015-04-03 MED ORDER — DICLOFENAC SODIUM 75 MG PO TBEC: 75.0000 mg | DELAYED_RELEASE_TABLET | Freq: Two times a day (BID) | ORAL | Status: DC

## 2015-04-03 NOTE — Progress Notes (Signed)
Subjective:    Patient ID: [name], female    DOB: [birth year], 82 y.o.   MRN: [id number]  Back Pain   08/18/14 Please see the patient's last office visit. [name] is complaining of persistent daily pain in her back. I obtained x-rays of thoracic spine which revealed a remote T12 compression fracture. Patient then obtain a bone density test to evaluate for osteoporosis. In fact her T Fox in the lumbar spine was outstanding at 0.5. Her T Fox at the wrist was -1.7. Patient believes [name] injured that bone when [name] fell several years ago. [name] also continues to complain of pain in her lumbar spine. X-rays of the lumbar spine reveal severe multilevel spondylosis. However the patient is experiencing no radiculopathy. [name] does complain of nightly insomnia.  [name] has tried Ambien and Xanax in the past with no relief.  At that time, my plan was: patient has back pain related to lumbar spondylosis as well as her T12 compression fracture. At the present time [name] would like to manage this sparingly using Aleve and Tylenol. If the pain worsens we can treat more aggressively. [name] is not interested in pain medication at the present time. I will treat her insomnia with Elavil 50 mg by mouth daily at bedtime  04/02/14 Elavil does not help her sleep at all. Ambien has not worked in the past. However [name] corrected me today and states that [name] has never tried Xanax. [name] is leery of taking a medicine that can cause dependence however [name] is unable to sleep without taking something. [name] would be willing to try Xanax. Although dangerous for a person her age to take a think this would be the least bad option given its short half-life. [name] also continues to have severe pain in her back. [name] has severe multilevel spondylosis and also scoliosis in the back. [name] also has a T12 compression fracture. Aleve is no longer helping with the pain although [name] is leery of taking any kind of pain medication. [name] is also leery of  cortisone injections in the back or physical therapy.  Past Medical History  Diagnosis Date  . Cataract   . Fatty liver disease, nonalcoholic     2007- admitted with encephalopathy unsure of cause, resolved sponatneously   Past Surgical History  Procedure Laterality Date  . Joint replacement      HIP  . Fracture surgery      right shoulder, both wrists    Current Outpatient Prescriptions on File Prior to Visit  Medication Sig Dispense Refill  . amitriptyline (ELAVIL) 50 MG tablet Take 1 tablet (50 mg total) by mouth at bedtime as needed for sleep. 30 tablet 3  . Naproxen Sodium (ALEVE PO) Take by mouth.     No current facility-administered medications on file prior to visit.   Allergies  Allergen Reactions  . Pneumococcal Vaccines Other (See Comments)    Pt had localized reaction to injection of Pneumococcal 23 - Swelling and redness in upper arm that extend to but not beyond elbow   Social History   Social History  . Marital Status: Married    Spouse Name: N/A  . Number of Children: N/A  . Years of Education: N/A   Occupational History  . Not on file.   Social History Main Topics  . Smoking status: Never Smoker   . Smokeless tobacco: Never Used  . Alcohol Use: No  . Drug Use: No  . Sexual Activity: Not Currently  Comment: raised tobacco, retired   Other Topics Concern  . Not on file   Social History Narrative      Review of Systems  Musculoskeletal: Positive for back pain.  All other systems reviewed and are negative.      Objective:   Physical Exam  Cardiovascular: Normal rate, regular rhythm and normal heart sounds.   Pulmonary/Chest: Effort normal and breath sounds normal.  Musculoskeletal:       Lumbar back: [name] exhibits decreased range of motion, tenderness and pain. [name] exhibits no bony tenderness.  Vitals reviewed.         Assessment & Plan:   Insomnia - Plan: ALPRAZolam (XANAX) 0.5 MG tablet  DDD (degenerative disc disease),  lumbar - Plan: diclofenac (VOLTAREN) 75 MG EC tablet, traMADol (ULTRAM) 50 MG tablet   stop Elavil. Try Xanax 0.5 mg by mouth daily at bedtime as needed for insomnia. We discussed the risk of taking this medication but at the present time I think this is her least bad option. Discontinue Aleve and start diclofenac 75 mg by mouth twice a day. I did warn the patient about stomach upset taking this medication and the risk for peptic ulcers. I did give her a prescription for tramadol. This is the week his pain pill that I have. [name] can take 1 tablet every 8 hours as needed for severe pain. If this does not help I strongly recommend physical therapy or an orthopedic consult to discuss possible epidural steroid injections with Dr. Ethelene Hal at [facility] ortho.

## 2015-04-11 DIAGNOSIS — H401132 Primary open-angle glaucoma, bilateral, moderate stage: Secondary | ICD-10-CM | POA: Diagnosis not present

## 2015-05-08 ENCOUNTER — Telehealth: Payer: Self-pay | Admitting: Family Medicine

## 2015-05-08 DIAGNOSIS — M5136 Other intervertebral disc degeneration, lumbar region: Secondary | ICD-10-CM

## 2015-05-08 DIAGNOSIS — G4700 Insomnia, unspecified: Secondary | ICD-10-CM

## 2015-05-08 NOTE — Telephone Encounter (Signed)
Pt needs a refill of Alprazolam 0.5 mg Amitriptylin 50 mg and Diclofenac 75 mg [facility] [address] [phone]

## 2015-05-08 NOTE — Telephone Encounter (Signed)
?   OK to Refill Xanax 

## 2015-05-08 NOTE — Telephone Encounter (Signed)
ok 

## 2015-05-10 MED ORDER — AMITRIPTYLINE HCL 50 MG PO TABS: 50.0000 mg | ORAL_TABLET | Freq: Every evening | ORAL | Status: DC | PRN

## 2015-05-10 MED ORDER — ALPRAZOLAM 0.5 MG PO TABS
0.5000 mg | ORAL_TABLET | Freq: Every day | ORAL | Status: DC
Start: 2015-05-10 — End: 2015-08-18

## 2015-05-10 MED ORDER — DICLOFENAC SODIUM 75 MG PO TBEC: 75.0000 mg | DELAYED_RELEASE_TABLET | Freq: Two times a day (BID) | ORAL | Status: DC

## 2015-05-10 NOTE — Telephone Encounter (Signed)
Meds sent to pharm

## 2015-06-13 ENCOUNTER — Other Ambulatory Visit: Payer: Self-pay | Admitting: Family Medicine

## 2015-06-13 DIAGNOSIS — M5136 Other intervertebral disc degeneration, lumbar region: Secondary | ICD-10-CM

## 2015-06-13 MED ORDER — TRAMADOL HCL 50 MG PO TABS: 50.0000 mg | ORAL_TABLET | Freq: Three times a day (TID) | ORAL | Status: DC | PRN

## 2015-06-13 NOTE — Telephone Encounter (Signed)
Requesting refill on Tramadol - Ok to refill??       

## 2015-06-13 NOTE — Telephone Encounter (Signed)
Medication called to pharmacy. 

## 2015-06-13 NOTE — Telephone Encounter (Signed)
ok 

## 2015-06-20 ENCOUNTER — Encounter: Payer: Self-pay | Admitting: Family Medicine

## 2015-06-20 ENCOUNTER — Ambulatory Visit (INDEPENDENT_AMBULATORY_CARE_PROVIDER_SITE_OTHER): Payer: PPO | Admitting: Family Medicine

## 2015-06-20 VITALS — BP 162/84 | HR 80 | Temp 97.9°F | Resp 18 | Wt 173.0 lb

## 2015-06-20 DIAGNOSIS — J208 Acute bronchitis due to other specified organisms: Secondary | ICD-10-CM | POA: Diagnosis not present

## 2015-06-20 MED ORDER — PREDNISONE 20 MG PO TABS: ORAL_TABLET | ORAL | Status: DC

## 2015-06-20 MED ORDER — AZITHROMYCIN 250 MG PO TABS: ORAL_TABLET | ORAL | Status: DC

## 2015-06-20 NOTE — Progress Notes (Signed)
Subjective:    Patient ID: [name], female    DOB: 06/24/1931, 82 y.o.   MRN: [id number]  HPI  Patient has been coughing daily for 4 weeks. At time the cough is productive of yellow and brown sputum. She is also wheezing. She reports chest congestion. She is also has some rhinorrhea that has been intermittent over last 4 weeks. She denies any fevers or chills. However today on examination she has expiratory wheezes that are faint but present bilaterally. She also has diminished breath sounds with prolonged expiratory phase bilaterally. There are faint bibasilar rales. Past Medical History  Diagnosis Date  . Cataract   . Fatty liver disease, nonalcoholic     2007- admitted with encephalopathy unsure of cause, resolved sponatneously   Past Surgical History  Procedure Laterality Date  . Joint replacement      HIP  . Fracture surgery      right shoulder, both wrists    Current Outpatient Prescriptions on File Prior to Visit  Medication Sig Dispense Refill  . ALPRAZolam (XANAX) 0.5 MG tablet Take 1 tablet (0.5 mg total) by mouth at bedtime. 30 tablet 2  . diclofenac (VOLTAREN) 75 MG EC tablet Take 1 tablet (75 mg total) by mouth 2 (two) times daily. 60 tablet 5  . Naproxen Sodium (ALEVE PO) Take by mouth.    . traMADol (ULTRAM) 50 MG tablet Take 1 tablet (50 mg total) by mouth every 8 (eight) hours as needed. 30 tablet 0  . amitriptyline (ELAVIL) 50 MG tablet Take 1 tablet (50 mg total) by mouth at bedtime as needed for sleep. (Patient not taking: Reported on 06/20/2015) 30 tablet 3   No current facility-administered medications on file prior to visit.   Allergies  Allergen Reactions  . Pneumococcal Vaccines Other (See Comments)    Pt had localized reaction to injection of Pneumococcal 23 - Swelling and redness in upper arm that extend to but not beyond elbow   Social History   Social History  . Marital Status: Married    Spouse Name: N/A  . Number of Children: N/A  .  Years of Education: N/A   Occupational History  . Not on file.   Social History Main Topics  . Smoking status: Never Smoker   . Smokeless tobacco: Never Used  . Alcohol Use: No  . Drug Use: No  . Sexual Activity: Not Currently     Comment: raised tobacco, retired   Other Topics Concern  . Not on file   Social History Narrative     Review of Systems  All other systems reviewed and are negative.      Objective:   Physical Exam  HENT:  Right Ear: External ear normal.  Left Ear: External ear normal.  Nose: Nose normal.  Mouth/Throat: Oropharynx is clear and moist.  Eyes: Conjunctivae are normal.  Neck: Neck supple.  Cardiovascular: Normal rate, regular rhythm and normal heart sounds.   Pulmonary/Chest: She has wheezes. She has rales.  Lymphadenopathy:    She has no cervical adenopathy.  Vitals reviewed.         Assessment & Plan:  Acute bronchitis due to other specified organisms - Plan: predniSONE (DELTASONE) 20 MG tablet, azithromycin (ZITHROMAX) 250 MG tablet  I will treat the patient for bacterial bronchitis with a Z-Pak and also use prednisone taper pack for the reactive airway disease she is experiencing secondary to the bronchitis. Blood pressures elevated today however this is the first time for office  visits were she's had elevated blood pressure. She will check this frequently at home over the next few weeks and notify me of the values prior to starting her on any medication

## 2015-06-29 ENCOUNTER — Telehealth: Payer: Self-pay | Admitting: *Deleted

## 2015-06-29 ENCOUNTER — Encounter: Payer: Self-pay | Admitting: *Deleted

## 2015-06-29 ENCOUNTER — Ambulatory Visit: Payer: PPO | Admitting: *Deleted

## 2015-06-29 VITALS — BP 158/82

## 2015-06-29 DIAGNOSIS — IMO0001 Reserved for inherently not codable concepts without codable children: Secondary | ICD-10-CM

## 2015-06-29 DIAGNOSIS — R030 Elevated blood-pressure reading, without diagnosis of hypertension: Principal | ICD-10-CM

## 2015-06-29 MED ORDER — LOSARTAN POTASSIUM-HCTZ 50-12.5 MG PO TABS: 1.0000 | ORAL_TABLET | Freq: Every day | ORAL | Status: DC

## 2015-06-29 NOTE — Telephone Encounter (Signed)
Call placed to patient and patient made aware.   Prescription sent to pharmacy.  

## 2015-06-29 NOTE — Progress Notes (Signed)
Patient ID: [name], female   DOB: 04/27/1931, 82 y.o.   MRN: [id number]  Patient seen in office to have BP checked.   BP remains elevated at 158/82.  Patient in no acute distress.   MD to be made aware.

## 2015-06-29 NOTE — Telephone Encounter (Signed)
Patient seen in office to have BP checked.   BP remains elevated at 158/82.  Patient in no acute distress.   MD to be made aware.

## 2015-06-29 NOTE — Telephone Encounter (Signed)
Begin hyzaar 50/12.5 poqday and recheck bp in 1 month.

## 2015-08-18 ENCOUNTER — Other Ambulatory Visit: Payer: Self-pay | Admitting: Family Medicine

## 2015-08-18 DIAGNOSIS — G4700 Insomnia, unspecified: Secondary | ICD-10-CM

## 2015-08-18 MED ORDER — ALPRAZOLAM 0.5 MG PO TABS: 0.5000 mg | ORAL_TABLET | Freq: Every day | ORAL | Status: DC

## 2015-09-12 ENCOUNTER — Other Ambulatory Visit: Payer: Self-pay | Admitting: Family Medicine

## 2015-09-12 DIAGNOSIS — M5136 Other intervertebral disc degeneration, lumbar region: Secondary | ICD-10-CM

## 2015-09-12 NOTE — Telephone Encounter (Signed)
ok 

## 2015-09-12 NOTE — Telephone Encounter (Signed)
Ok to refill??  Last office visit 06/20/2015.  Last refill 06/13/2015.

## 2015-09-13 NOTE — Telephone Encounter (Signed)
Medication called to pharmacy. 

## 2015-09-29 ENCOUNTER — Encounter: Payer: Self-pay | Admitting: Family Medicine

## 2015-09-29 ENCOUNTER — Ambulatory Visit (INDEPENDENT_AMBULATORY_CARE_PROVIDER_SITE_OTHER): Payer: PPO | Admitting: Family Medicine

## 2015-09-29 VITALS — BP 170/100 | HR 80 | Temp 98.5°F | Resp 18 | Ht 63.0 in | Wt 158.0 lb

## 2015-09-29 DIAGNOSIS — Z658 Other specified problems related to psychosocial circumstances: Secondary | ICD-10-CM | POA: Diagnosis not present

## 2015-09-29 DIAGNOSIS — G4700 Insomnia, unspecified: Secondary | ICD-10-CM | POA: Diagnosis not present

## 2015-09-29 DIAGNOSIS — M5136 Other intervertebral disc degeneration, lumbar region: Secondary | ICD-10-CM | POA: Diagnosis not present

## 2015-09-29 DIAGNOSIS — F439 Reaction to severe stress, unspecified: Secondary | ICD-10-CM

## 2015-09-29 MED ORDER — TRAMADOL HCL 50 MG PO TABS: 50.0000 mg | ORAL_TABLET | Freq: Three times a day (TID) | ORAL | 1 refills | Status: DC | PRN

## 2015-09-29 MED ORDER — ALPRAZOLAM 0.5 MG PO TABS: 1.0000 mg | ORAL_TABLET | Freq: Three times a day (TID) | ORAL | 0 refills | Status: DC | PRN

## 2015-09-29 NOTE — Progress Notes (Signed)
Subjective:    Patient ID: [name], female    DOB: 05/07/1931, 82 y.o.   MRN: [id number]  HPI Patient's daughter recently died. She has had this dog for several years. She has grown very attached to this dog. This has her extremely emotional. She is crying today during our encounter. She reports uncontrolled anxiety and stress. She is also extremely stressed out caring for her aunt who has dementia and is in a nursing home. She calls her numerous times every day basically bothering her causing the patient to feel extremely stressed out. Her husband has also recently undergone surgery for melanoma and has been diagnosed with atrial flutter and is currently undergoing a cardiovascular workup. All of this together as the patient extremely anxious. She denies panic attacks but she's having a difficult time sleeping. She is wanting something to help calm her nerves in the short run just help her make it through the next few weeks until hopefully the situations improve Past Medical History:  Diagnosis Date  . Cataract   . Fatty liver disease, nonalcoholic    2007- admitted with encephalopathy unsure of cause, resolved sponatneously   Past Surgical History:  Procedure Laterality Date  . FRACTURE SURGERY     right shoulder, both wrists   . JOINT REPLACEMENT     HIP   Current Outpatient Prescriptions on File Prior to Visit  Medication Sig Dispense Refill  . diclofenac (VOLTAREN) 75 MG EC tablet Take 1 tablet (75 mg total) by mouth 2 (two) times daily. 60 tablet 5  . Naproxen Sodium (ALEVE PO) Take by mouth.    Marland Kitchen. amitriptyline (ELAVIL) 50 MG tablet Take 1 tablet (50 mg total) by mouth at bedtime as needed for sleep. (Patient not taking: Reported on 06/20/2015) 30 tablet 3  . losartan-hydrochlorothiazide (HYZAAR) 50-12.5 MG tablet Take 1 tablet by mouth daily. (Patient not taking: Reported on 09/29/2015) 90 tablet 3   No current facility-administered medications on file prior to visit.     Allergies  Allergen Reactions  . Pneumococcal Vaccines Other (See Comments)    Pt had localized reaction to injection of Pneumococcal 23 - Swelling and redness in upper arm that extend to but not beyond elbow   Social History   Social History  . Marital status: Married    Spouse name: N/A  . Number of children: N/A  . Years of education: N/A   Occupational History  . Not on file.   Social History Main Topics  . Smoking status: Never Smoker  . Smokeless tobacco: Never Used  . Alcohol use No  . Drug use: No  . Sexual activity: Not Currently     Comment: raised tobacco, retired   Other Topics Concern  . Not on file   Social History Narrative  . No narrative on file      Review of Systems  All other systems reviewed and are negative.      Objective:   Physical Exam  Cardiovascular: Normal rate, regular rhythm and normal heart sounds.   No murmur heard. Pulmonary/Chest: Effort normal and breath sounds normal.  Psychiatric: Her behavior is normal. Judgment and thought content normal. Her mood appears anxious. Cognition and memory are normal.  Vitals reviewed.         Assessment & Plan:  Situational stress  Insomnia - Plan: ALPRAZolam (XANAX) 0.5 MG tablet  DDD (degenerative disc disease), lumbar - Plan: traMADol (ULTRAM) 50 MG tablet  I recommended the patient try Xanax 0.5  mg to 1 mg every 8 hours as needed for anxiety. We discussed risk of habituation and the risk of falls. The patient will try to use medication only as necessary it sparingly and will try to wean away from the medication over the next few weeks. I did refill her tramadol that she takes occasionally for low back pain

## 2015-10-03 DIAGNOSIS — H40153 Residual stage of open-angle glaucoma, bilateral: Secondary | ICD-10-CM | POA: Diagnosis not present

## 2015-11-27 DIAGNOSIS — Z1231 Encounter for screening mammogram for malignant neoplasm of breast: Secondary | ICD-10-CM | POA: Diagnosis not present

## 2015-11-27 LAB — HM MAMMOGRAPHY

## 2015-11-30 ENCOUNTER — Encounter: Payer: Self-pay | Admitting: Family Medicine

## 2015-12-27 ENCOUNTER — Ambulatory Visit (INDEPENDENT_AMBULATORY_CARE_PROVIDER_SITE_OTHER): Payer: PPO | Admitting: Family Medicine

## 2015-12-27 DIAGNOSIS — Z23 Encounter for immunization: Secondary | ICD-10-CM

## 2016-01-02 ENCOUNTER — Other Ambulatory Visit: Payer: Self-pay | Admitting: Family Medicine

## 2016-01-02 DIAGNOSIS — F5101 Primary insomnia: Secondary | ICD-10-CM

## 2016-01-02 MED ORDER — ALPRAZOLAM 0.5 MG PO TABS: 1.0000 mg | ORAL_TABLET | Freq: Three times a day (TID) | ORAL | 0 refills | Status: DC | PRN

## 2016-01-17 DIAGNOSIS — H40153 Residual stage of open-angle glaucoma, bilateral: Secondary | ICD-10-CM | POA: Diagnosis not present

## 2016-02-23 ENCOUNTER — Telehealth: Payer: Self-pay | Admitting: Family Medicine

## 2016-02-23 NOTE — Telephone Encounter (Signed)
Tried to call line busy 

## 2016-02-23 NOTE — Telephone Encounter (Signed)
Patient is calling to speak to you regarding her leg and what follow up she may need  [phone]

## 2016-02-26 NOTE — Telephone Encounter (Signed)
Called patient and questions about husband answered and appt made.

## 2016-02-29 ENCOUNTER — Other Ambulatory Visit: Payer: Self-pay | Admitting: Family Medicine

## 2016-02-29 DIAGNOSIS — F5101 Primary insomnia: Secondary | ICD-10-CM

## 2016-02-29 DIAGNOSIS — M51369 Other intervertebral disc degeneration, lumbar region without mention of lumbar back pain or lower extremity pain: Secondary | ICD-10-CM

## 2016-02-29 DIAGNOSIS — M5136 Other intervertebral disc degeneration, lumbar region: Secondary | ICD-10-CM

## 2016-02-29 NOTE — Telephone Encounter (Signed)
ok 

## 2016-02-29 NOTE — Telephone Encounter (Signed)
Medication called to pharmacy. 

## 2016-02-29 NOTE — Telephone Encounter (Signed)
Ok to refill 

## 2016-04-08 ENCOUNTER — Other Ambulatory Visit: Payer: Self-pay | Admitting: Family Medicine

## 2016-04-08 DIAGNOSIS — F5101 Primary insomnia: Secondary | ICD-10-CM

## 2016-04-08 NOTE — Telephone Encounter (Signed)
Ok to refill 

## 2016-04-08 NOTE — Telephone Encounter (Signed)
Medication called to pharmacy. 

## 2016-04-08 NOTE — Telephone Encounter (Signed)
ok 

## 2016-05-08 ENCOUNTER — Other Ambulatory Visit: Payer: Self-pay | Admitting: Family Medicine

## 2016-05-08 DIAGNOSIS — F5101 Primary insomnia: Secondary | ICD-10-CM

## 2016-05-08 NOTE — Telephone Encounter (Signed)
Last OV 8-25 Last refill 3/5 #60 Ok to refill?

## 2016-05-09 NOTE — Telephone Encounter (Signed)
ok 

## 2016-05-09 NOTE — Telephone Encounter (Signed)
Rx called in 

## 2016-06-08 ENCOUNTER — Other Ambulatory Visit: Payer: Self-pay | Admitting: Family Medicine

## 2016-06-08 DIAGNOSIS — F5101 Primary insomnia: Secondary | ICD-10-CM

## 2016-06-08 DIAGNOSIS — M5136 Other intervertebral disc degeneration, lumbar region: Secondary | ICD-10-CM

## 2016-06-10 NOTE — Telephone Encounter (Signed)
Ok to refill Xanax and Tramadol? 

## 2016-06-10 NOTE — Telephone Encounter (Signed)
ok 

## 2016-06-11 NOTE — Telephone Encounter (Signed)
Medication called to pharmacy. 

## 2016-08-06 ENCOUNTER — Other Ambulatory Visit: Payer: Self-pay | Admitting: Family Medicine

## 2016-08-06 DIAGNOSIS — F5101 Primary insomnia: Secondary | ICD-10-CM

## 2016-08-06 NOTE — Telephone Encounter (Signed)
Ok to refill 

## 2016-08-08 ENCOUNTER — Other Ambulatory Visit: Payer: Self-pay | Admitting: Family Medicine

## 2016-08-08 DIAGNOSIS — F5101 Primary insomnia: Secondary | ICD-10-CM

## 2016-08-08 NOTE — Telephone Encounter (Signed)
Ok to refill 

## 2016-08-08 NOTE — Telephone Encounter (Signed)
ok 

## 2016-08-09 NOTE — Telephone Encounter (Signed)
Medication called to pharmacy. 

## 2016-08-13 ENCOUNTER — Ambulatory Visit (INDEPENDENT_AMBULATORY_CARE_PROVIDER_SITE_OTHER): Payer: PPO | Admitting: Family Medicine

## 2016-08-13 ENCOUNTER — Encounter: Payer: Self-pay | Admitting: Family Medicine

## 2016-08-13 VITALS — BP 190/96 | HR 80 | Temp 98.1°F | Resp 18 | Ht 63.0 in | Wt 160.0 lb

## 2016-08-13 DIAGNOSIS — I10 Essential (primary) hypertension: Secondary | ICD-10-CM | POA: Diagnosis not present

## 2016-08-13 DIAGNOSIS — R5383 Other fatigue: Secondary | ICD-10-CM

## 2016-08-13 DIAGNOSIS — E538 Deficiency of other specified B group vitamins: Secondary | ICD-10-CM | POA: Diagnosis not present

## 2016-08-13 LAB — CBC WITH DIFFERENTIAL/PLATELET
Basophils Absolute: 39 cells/uL (ref 0–200)
Basophils Relative: 1 %
EOS ABS: 156 {cells}/uL (ref 15–500)
Eosinophils Relative: 4 %
HEMATOCRIT: 36.5 % (ref 35.0–45.0)
HEMOGLOBIN: 11.8 g/dL — AB (ref 12.0–15.0)
LYMPHS ABS: 1482 {cells}/uL (ref 850–3900)
LYMPHS PCT: 38 %
MCH: 30.6 pg (ref 27.0–33.0)
MCHC: 32.3 g/dL (ref 32.0–36.0)
MCV: 94.8 fL (ref 80.0–100.0)
MONO ABS: 351 {cells}/uL (ref 200–950)
MPV: 9.8 fL (ref 7.5–12.5)
Monocytes Relative: 9 %
NEUTROS PCT: 48 %
Neutro Abs: 1872 cells/uL (ref 1500–7800)
Platelets: 197 10*3/uL (ref 140–400)
RBC: 3.85 MIL/uL (ref 3.80–5.10)
RDW: 14.3 % (ref 11.0–15.0)
WBC: 3.9 10*3/uL (ref 3.8–10.8)

## 2016-08-13 NOTE — Progress Notes (Signed)
Subjective:    Patient ID: [name], female    DOB: 10/19/1931, 83 y.o.   MRN: [id number]  HPI  Patient is a very pleasant 83 year old Caucasian female who presents today complaining of numbness in both of her feet distal to the MTP joints. She states that the skin there feels thick like she's been stung by a bee. She denies any pain. She denies any tingling or burning. She states that the feet feel thick and swollen although visibly there not swollen. Since last time I saw the patient, unfortunately she has lost her husband. He died from kidney failure. She is also extremely sad. Patient's husband passed away in February. She now reports fatigue. She tells remain isolated. She doesn't want to talk to people. She doesn't want to go out. She would rather be alone. Her blood pressure today is extremely high. I confirmed this value myself. She has stopped taking her blood pressure medication. She denies any chest pain or shortness of breath although she does complain of significant fatigue and lack of energy. Wt Readings from Last 3 Encounters:  08/13/16 160 lb (72.6 kg)  09/29/15 158 lb (71.7 kg)  06/20/15 173 lb (78.5 kg)   Past Medical History:  Diagnosis Date  . Cataract   . Fatty liver disease, nonalcoholic    2007- admitted with encephalopathy unsure of cause, resolved sponatneously  . Hypertension    Past Surgical History:  Procedure Laterality Date  . FRACTURE SURGERY     right shoulder, both wrists   . JOINT REPLACEMENT     HIP   Current Outpatient Prescriptions on File Prior to Visit  Medication Sig Dispense Refill  . ALPRAZolam (XANAX) 0.5 MG tablet TAKE 2 TABLETS BY MOUTH THREE TIMES DAILY AS NEEDED FOR ANXIETY 60 tablet 1  . amitriptyline (ELAVIL) 50 MG tablet Take 1 tablet (50 mg total) by mouth at bedtime as needed for sleep. 30 tablet 3  . diclofenac (VOLTAREN) 75 MG EC tablet TAKE ONE TABLET BY MOUTH TWICE DAILY 60 tablet 5  . Naproxen Sodium (ALEVE PO) Take by  mouth.    . traMADol (ULTRAM) 50 MG tablet TAKE ONE TABLET BY MOUTH EVERY 8 HOURS AS NEEDED 60 tablet 1  . losartan-hydrochlorothiazide (HYZAAR) 50-12.5 MG tablet Take 1 tablet by mouth daily. (Patient not taking: Reported on 09/29/2015) 90 tablet 3   No current facility-administered medications on file prior to visit.    Allergies  Allergen Reactions  . Pneumococcal Vaccines Other (See Comments)    Pt had localized reaction to injection of Pneumococcal 23 - Swelling and redness in upper arm that extend to but not beyond elbow   Social History   Social History  . Marital status: Married    Spouse name: N/A  . Number of children: N/A  . Years of education: N/A   Occupational History  . Not on file.   Social History Main Topics  . Smoking status: Never Smoker  . Smokeless tobacco: Never Used  . Alcohol use No  . Drug use: No  . Sexual activity: Not Currently     Comment: raised tobacco, retired   Other Topics Concern  . Not on file   Social History Narrative  . No narrative on file      Review of Systems  All other systems reviewed and are negative.      Objective:   Physical Exam  Constitutional: She appears well-developed and well-nourished.  Cardiovascular: Normal rate, regular rhythm and normal  heart sounds.   No murmur heard. Pulmonary/Chest: Effort normal and breath sounds normal. No respiratory distress. She has no wheezes. She has no rales.  Abdominal: Soft. Bowel sounds are normal. She exhibits no distension. There is no tenderness. There is no rebound and no guarding.  Musculoskeletal: She exhibits no edema.  Neurological: She has normal strength. A sensory deficit is present. Coordination and gait normal.  Vitals reviewed.   Patient is unable to feel a tuning fork vibrating on the plantar aspect of her feet      Assessment & Plan:  Benign essential HTN  Fatigue, unspecified type - Plan: CBC with Differential/Platelet, COMPLETE METABOLIC PANEL  WITH GFR, TSH, Vitamin B12  I'm very concerned about her blood pressure. I will her to start losartan hydrochlorothiazide 50/12.5 one by mouth daily and recheck blood pressure here in one week. She will likely need more medication however I'll give her body a chance to adjust given her age. Given her fatigue, I will check a CBC, CMP, TSH, and vitamin B12. However I suspect the fatigue is likely secondary to depression and grieving over the loss of her husband. We'll discuss treatment options after we have the results of her lab work.

## 2016-08-14 LAB — COMPLETE METABOLIC PANEL WITH GFR
ALBUMIN: 4.2 g/dL (ref 3.6–5.1)
ALT: 16 U/L (ref 6–29)
AST: 21 U/L (ref 10–35)
Alkaline Phosphatase: 69 U/L (ref 33–130)
BUN: 24 mg/dL (ref 7–25)
CALCIUM: 9.5 mg/dL (ref 8.6–10.4)
CO2: 23 mmol/L (ref 20–31)
CREATININE: 1.17 mg/dL — AB (ref 0.60–0.88)
Chloride: 105 mmol/L (ref 98–110)
GFR, EST AFRICAN AMERICAN: 50 mL/min — AB (ref >=60)
GFR, Est Non African American: 43 mL/min — ABNORMAL LOW (ref >=60)
Glucose, Bld: 85 mg/dL (ref 70–99)
POTASSIUM: 5.1 mmol/L (ref 3.5–5.3)
Sodium: 138 mmol/L (ref 135–146)
Total Bilirubin: 0.4 mg/dL (ref 0.2–1.2)
Total Protein: 6.5 g/dL (ref 6.1–8.1)

## 2016-08-14 LAB — TSH: TSH: 1.19 m[IU]/L

## 2016-08-14 LAB — VITAMIN B12: Vitamin B-12: 252 pg/mL (ref 200–1100)

## 2016-08-20 ENCOUNTER — Ambulatory Visit: Payer: PPO

## 2016-08-20 ENCOUNTER — Other Ambulatory Visit: Payer: Self-pay

## 2016-08-20 ENCOUNTER — Other Ambulatory Visit: Payer: PPO

## 2016-08-20 VITALS — BP 140/70 | HR 75

## 2016-08-20 DIAGNOSIS — I10 Essential (primary) hypertension: Secondary | ICD-10-CM

## 2016-08-20 MED ORDER — LOSARTAN POTASSIUM-HCTZ 50-12.5 MG PO TABS: 1.0000 | ORAL_TABLET | Freq: Every day | ORAL | 3 refills | Status: DC

## 2016-09-27 ENCOUNTER — Other Ambulatory Visit: Payer: Self-pay | Admitting: Family Medicine

## 2016-09-27 DIAGNOSIS — M5136 Other intervertebral disc degeneration, lumbar region: Secondary | ICD-10-CM

## 2016-09-27 DIAGNOSIS — M51369 Other intervertebral disc degeneration, lumbar region without mention of lumbar back pain or lower extremity pain: Secondary | ICD-10-CM

## 2016-09-27 NOTE — Telephone Encounter (Signed)
Ok to refill 

## 2016-09-30 ENCOUNTER — Other Ambulatory Visit: Payer: Self-pay | Admitting: Family Medicine

## 2016-09-30 DIAGNOSIS — M5136 Other intervertebral disc degeneration, lumbar region: Secondary | ICD-10-CM

## 2016-09-30 NOTE — Telephone Encounter (Signed)
ok 

## 2016-10-07 ENCOUNTER — Other Ambulatory Visit: Payer: Self-pay | Admitting: Family Medicine

## 2016-10-07 DIAGNOSIS — F5101 Primary insomnia: Secondary | ICD-10-CM

## 2016-10-08 ENCOUNTER — Other Ambulatory Visit: Payer: Self-pay | Admitting: Family Medicine

## 2016-10-08 DIAGNOSIS — F5101 Primary insomnia: Secondary | ICD-10-CM

## 2016-10-08 NOTE — Telephone Encounter (Signed)
Ok to refill 

## 2016-10-08 NOTE — Telephone Encounter (Signed)
ok 

## 2016-10-31 ENCOUNTER — Encounter: Payer: Self-pay | Admitting: Family Medicine

## 2016-10-31 ENCOUNTER — Ambulatory Visit (INDEPENDENT_AMBULATORY_CARE_PROVIDER_SITE_OTHER): Payer: PPO | Admitting: Family Medicine

## 2016-10-31 ENCOUNTER — Ambulatory Visit
Admission: RE | Admit: 2016-10-31 | Discharge: 2016-10-31 | Disposition: A | Discharge status: Home / Self Care | Payer: PPO | Source: Ambulatory Visit | Attending: Family Medicine | Admitting: Family Medicine

## 2016-10-31 VITALS — BP 190/80 | HR 80 | Temp 97.9°F | Resp 16 | Ht 63.0 in | Wt 162.0 lb

## 2016-10-31 DIAGNOSIS — I10 Essential (primary) hypertension: Secondary | ICD-10-CM | POA: Diagnosis not present

## 2016-10-31 DIAGNOSIS — M545 Low back pain, unspecified: Secondary | ICD-10-CM

## 2016-10-31 DIAGNOSIS — M4804 Spinal stenosis, thoracic region: Secondary | ICD-10-CM | POA: Diagnosis not present

## 2016-10-31 DIAGNOSIS — F321 Major depressive disorder, single episode, moderate: Secondary | ICD-10-CM

## 2016-10-31 DIAGNOSIS — M5136 Other intervertebral disc degeneration, lumbar region: Secondary | ICD-10-CM | POA: Diagnosis not present

## 2016-10-31 MED ORDER — AMLODIPINE BESYLATE 10 MG PO TABS: 10.0000 mg | ORAL_TABLET | Freq: Every day | ORAL | 3 refills | Status: DC

## 2016-10-31 NOTE — Progress Notes (Signed)
Subjective:    Patient ID: [name], female    DOB: [birth year], 83 y.o.   MRN: [id number]  HPI  Patient is a very pleasant 83 year old Caucasian female who presents today complaining of pain in her mid back mainly on the left side. What is very noticeable is that the patient is not standing straight. She has noticeable dextroscoliosis of a right curvature which seems to be voluntary of her lower back. She appears to be in significant pain and this seems to be compensatory. She states the pain began after she fell late this spring. I'm concerned that she may have a vertebral fracture causing a wedge type deformity creating the dextroscoliosis in the lumbar spine with a levoscoliosis in the thoracic spine which is noticeable today but the patient was unaware. She has no tenderness to palpation however over the spinous processes or over her ribs. She denies any numbness or tingling in her legs. She denies any weakness in her legs. She does report severe depression. She reports severe fatigue. She states that since her husband died she doesn't even want to be around people. She doesn't want to leave the home. She has no desire to do anything. Her blood pressure today is extremely high. Past Medical History:  Diagnosis Date  . Cataract   . Fatty liver disease, nonalcoholic    2007- admitted with encephalopathy unsure of cause, resolved sponatneously  . Hypertension    Past Surgical History:  Procedure Laterality Date  . FRACTURE SURGERY     right shoulder, both wrists   . JOINT REPLACEMENT     HIP   Current Outpatient Prescriptions on File Prior to Visit  Medication Sig Dispense Refill  . ALPRAZolam (XANAX) 0.5 MG tablet TAKE 2 TABLETS BY MOUTH THREE TIMES DAILY AS NEEDED FOR ANXIETY 60 tablet 1  . amitriptyline (ELAVIL) 50 MG tablet Take 1 tablet (50 mg total) by mouth at bedtime as needed for sleep. 30 tablet 3  . diclofenac (VOLTAREN) 75 MG EC tablet TAKE ONE TABLET BY MOUTH TWICE  DAILY 60 tablet 5  . losartan-hydrochlorothiazide (HYZAAR) 50-12.5 MG tablet Take 1 tablet by mouth daily. 90 tablet 3  . Naproxen Sodium (ALEVE PO) Take by mouth.    . traMADol (ULTRAM) 50 MG tablet TAKE 1 TABLET BY MOUTH EVERY 8 HOURS AS NEEDED FOR PAIN 60 tablet 1   No current facility-administered medications on file prior to visit.    Allergies  Allergen Reactions  . Pneumococcal Vaccines Other (See Comments)    Pt had localized reaction to injection of Pneumococcal 23 - Swelling and redness in upper arm that extend to but not beyond elbow   Social History   Social History  . Marital status: Married    Spouse name: N/A  . Number of children: N/A  . Years of education: N/A   Occupational History  . Not on file.   Social History Main Topics  . Smoking status: Never Smoker  . Smokeless tobacco: Never Used  . Alcohol use No  . Drug use: No  . Sexual activity: Not Currently     Comment: raised tobacco, retired   Other Topics Concern  . Not on file   Social History Narrative  . No narrative on file      Review of Systems  All other systems reviewed and are negative.      Objective:   Physical Exam  Constitutional: She appears well-developed and well-nourished.  Cardiovascular: Normal rate, regular rhythm and  normal heart sounds.   No murmur heard. Pulmonary/Chest: Effort normal and breath sounds normal. No respiratory distress. She has no wheezes. She has no rales.  Abdominal: Soft. Bowel sounds are normal. She exhibits no distension. There is no tenderness. There is no rebound and no guarding.  Musculoskeletal: She exhibits no edema.       Thoracic back: She exhibits decreased range of motion, tenderness, pain and spasm.       Lumbar back: She exhibits decreased range of motion, tenderness, pain and spasm. She exhibits no bony tenderness.       Back:  Neurological: She has normal strength. A sensory deficit is present. Coordination and gait normal.  Vitals  reviewed.      Assessment & Plan:  Acute midline low back pain without sciatica - Plan: DG Thoracic Spine W/Swimmers, DG Lumbar Spine Complete  Depression, major, single episode, moderate (HCC)  Patient has a visible deformity in the way she is holding her body in her spine. This seems to be secondary and volitional due to pain and spasm. I'm concerned that with the father spring, she may have suffered some type of wedge type vertebral fracture that could be causing this deformity. Therefore I want to send the patient immediately for x-rays of her lumbar and thoracic spine to evaluate further. If this is in fact the case, I will consult neurosurgery regarding kyphoplasty. Her blood pressure today is extremely high, I will start the patient on amlodipine 10 mg a day and recheck her blood pressure next week. At that time, I would like to discuss treatment options for depression.

## 2016-11-04 ENCOUNTER — Other Ambulatory Visit: Payer: Self-pay | Admitting: Family Medicine

## 2016-11-04 DIAGNOSIS — I10 Essential (primary) hypertension: Secondary | ICD-10-CM

## 2016-11-04 MED ORDER — AMLODIPINE BESYLATE 10 MG PO TABS: 10.0000 mg | ORAL_TABLET | Freq: Every day | ORAL | 3 refills | Status: DC

## 2016-11-07 ENCOUNTER — Encounter: Payer: Self-pay | Admitting: Family Medicine

## 2016-11-07 ENCOUNTER — Ambulatory Visit (INDEPENDENT_AMBULATORY_CARE_PROVIDER_SITE_OTHER): Payer: PPO | Admitting: Family Medicine

## 2016-11-07 VITALS — BP 124/60 | HR 73 | Temp 97.7°F | Resp 14 | Ht 61.0 in | Wt 159.0 lb

## 2016-11-07 DIAGNOSIS — M545 Low back pain, unspecified: Secondary | ICD-10-CM

## 2016-11-07 DIAGNOSIS — M5136 Other intervertebral disc degeneration, lumbar region: Secondary | ICD-10-CM

## 2016-11-07 DIAGNOSIS — M51369 Other intervertebral disc degeneration, lumbar region without mention of lumbar back pain or lower extremity pain: Secondary | ICD-10-CM

## 2016-11-07 DIAGNOSIS — M2170 Unequal limb length (acquired), unspecified site: Secondary | ICD-10-CM | POA: Diagnosis not present

## 2016-11-07 DIAGNOSIS — M8008XD Age-related osteoporosis with current pathological fracture, vertebra(e), subsequent encounter for fracture with routine healing: Secondary | ICD-10-CM

## 2016-11-07 DIAGNOSIS — Z23 Encounter for immunization: Secondary | ICD-10-CM | POA: Diagnosis not present

## 2016-11-07 DIAGNOSIS — M8088XD Other osteoporosis with current pathological fracture, vertebra(e), subsequent encounter for fracture with routine healing: Secondary | ICD-10-CM | POA: Diagnosis not present

## 2016-11-07 NOTE — Progress Notes (Signed)
Subjective:    Patient ID: [name], female    DOB: 1931-05-20, 83 y.o.   MRN: [id number]  HPI 10/31/16 Patient is a very pleasant 83 year old Caucasian female who presents today complaining of pain in her mid back mainly on the left side. What is very noticeable is that the patient is not standing straight. She has noticeable dextroscoliosis of a right curvature which seems to be voluntary of her lower back. She appears to be in significant pain and this seems to be compensatory. She states the pain began after she fell late this spring. I'm concerned that she may have a vertebral fracture causing a wedge type deformity creating the dextroscoliosis in the lumbar spine with a levoscoliosis in the thoracic spine which is noticeable today but the patient was unaware. She has no tenderness to palpation however over the spinous processes or over her ribs. She denies any numbness or tingling in her legs. She denies any weakness in her legs. She does report severe depression. She reports severe fatigue. She states that since her husband died she doesn't even want to be around people. She doesn't want to leave the home. She has no desire to do anything. Her blood pressure today is extremely high.  At that time, my plan was: Patient has a visible deformity in the way she is holding her body in her spine. This seems to be secondary and volitional due to pain and spasm. I'm concerned that with the father spring, she may have suffered some type of wedge type vertebral fracture that could be causing this deformity. Therefore I want to send the patient immediately for x-rays of her lumbar and thoracic spine to evaluate further. If this is in fact the case, I will consult neurosurgery regarding kyphoplasty. Her blood pressure today is extremely high, I will start the patient on amlodipine 10 mg a day and recheck her blood pressure next week. At that time, I would like to discuss treatment options for  depression.  11/07/16 X-ray confirmed an old T12 compression fracture and a new T11 endplate deformity suggesting osteoporotic vertebral fractures. I recommended that the patient come back in today to discuss prolia to prevent future osteoporotic fractures and to discuss a referral to orthopedics for possible kyphoplasty. However fortunately, the patient's low back pain has completely subsided. She has discovered that her right leg is much shorter than her left leg due to previous surgery. Therefore she has a leg length discrepancy that seem to be triggering the muscle spasms in her back that was causing her back pain. She has created a lift put in her shoe which has since improved her back pain. She would like to see a podiatrist to have a formal orthotic made for her shoes that she can use long-term because this is helped her back pain substantially. Past Medical History:  Diagnosis Date  . Cataract   . Fatty liver disease, nonalcoholic    2007- admitted with encephalopathy unsure of cause, resolved sponatneously  . Hypertension    Past Surgical History:  Procedure Laterality Date  . FRACTURE SURGERY     right shoulder, both wrists   . JOINT REPLACEMENT     HIP   Current Outpatient Prescriptions on File Prior to Visit  Medication Sig Dispense Refill  . ALPRAZolam (XANAX) 0.5 MG tablet TAKE 2 TABLETS BY MOUTH THREE TIMES DAILY AS NEEDED FOR ANXIETY 60 tablet 1  . amLODipine (NORVASC) 10 MG tablet Take 1 tablet (10 mg total) by  mouth daily. 90 tablet 3  . diclofenac (VOLTAREN) 75 MG EC tablet TAKE ONE TABLET BY MOUTH TWICE DAILY 60 tablet 5  . losartan-hydrochlorothiazide (HYZAAR) 50-12.5 MG tablet Take 1 tablet by mouth daily. 90 tablet 3  . Naproxen Sodium (ALEVE PO) Take by mouth.    . traMADol (ULTRAM) 50 MG tablet TAKE 1 TABLET BY MOUTH EVERY 8 HOURS AS NEEDED FOR PAIN 60 tablet 1   No current facility-administered medications on file prior to visit.    Allergies  Allergen Reactions   . Pneumococcal Vaccines Other (See Comments)    Pt had localized reaction to injection of Pneumococcal 23 - Swelling and redness in upper arm that extend to but not beyond elbow   Social History   Social History  . Marital status: Married    Spouse name: N/A  . Number of children: N/A  . Years of education: N/A   Occupational History  . Not on file.   Social History Main Topics  . Smoking status: Never Smoker  . Smokeless tobacco: Never Used  . Alcohol use No  . Drug use: No  . Sexual activity: Not Currently     Comment: raised tobacco, retired   Other Topics Concern  . Not on file   Social History Narrative  . No narrative on file      Review of Systems  All other systems reviewed and are negative.      Objective:   Physical Exam  Constitutional: She appears well-developed and well-nourished.  Cardiovascular: Normal rate, regular rhythm and normal heart sounds.   No murmur heard. Pulmonary/Chest: Effort normal and breath sounds normal. No respiratory distress. She has no wheezes. She has no rales.  Abdominal: Soft. Bowel sounds are normal. She exhibits no distension. There is no tenderness. There is no rebound and no guarding.  Musculoskeletal: She exhibits no edema.       Thoracic back: She exhibits decreased range of motion, tenderness, pain and spasm.       Lumbar back: She exhibits decreased range of motion, tenderness, pain and spasm. She exhibits no bony tenderness.       Back:  Neurological: She has normal strength. A sensory deficit is present. Coordination and gait normal.  Vitals reviewed.      Assessment & Plan:  Flu vaccine need - Plan: Flu vaccine HIGH DOSE PF  Leg length discrepancy - Plan: Ambulatory referral to Podiatry  Acute midline low back pain without sciatica - Plan: Ambulatory referral to Podiatry  DDD (degenerative disc disease), lumbar  Fracture of vertebra due to osteoporosis with routine healing, subsequent  encounter  Patient's back pain was due to muscle spasms. At first I thought it was due to vertebral fractures that it seems to be due to leg length discrepancy. I am perfectly happy with her seeing a podiatrist to have a formal orthotic made for her shoes that were correct for the leg length discrepancy that she could use permanently. However she does have vertebral fractures. At the present time she does not require kyphoplasty as her pain is better but I would like to start her on prolia osteoporotic vertebral fractures to prevent future wounds. Her blood pressure today is much better. She admits that she had not been taking her losartan hydrochlorothiazide. She has since resuming the medication and her blood pressure is better. She is not taking amlodipine. Therefore I asked her to be compliant with her medication and to check her blood pressure everyday and notify me  of the values in 2 weeks. If high, we will add amlodipine at that time

## 2016-11-20 ENCOUNTER — Other Ambulatory Visit: Payer: Self-pay | Admitting: Family Medicine

## 2016-11-20 DIAGNOSIS — F5101 Primary insomnia: Secondary | ICD-10-CM

## 2016-11-20 NOTE — Telephone Encounter (Signed)
Ok but make it 1 -2 tabs q 8 hrs prn

## 2016-11-20 NOTE — Telephone Encounter (Signed)
Ok to refill 

## 2016-11-20 NOTE — Telephone Encounter (Signed)
Medication called to pharmacy. 

## 2016-11-22 ENCOUNTER — Ambulatory Visit (INDEPENDENT_AMBULATORY_CARE_PROVIDER_SITE_OTHER): Payer: PPO

## 2016-11-22 DIAGNOSIS — M8580 Other specified disorders of bone density and structure, unspecified site: Secondary | ICD-10-CM | POA: Diagnosis not present

## 2016-11-22 MED ORDER — DENOSUMAB 60 MG/ML ~~LOC~~ SOLN
60.0000 mg | SUBCUTANEOUS | Status: DC
  Administered 2016-11-22 – 2021-11-22 (×7): 60 mg via SUBCUTANEOUS

## 2016-11-22 NOTE — Progress Notes (Signed)
patient started prolia today as instructed by provider. Patient received injection in left arm subcu. Patient tolerated well and was instructed to start calcium as well as vitamin d

## 2016-12-03 DIAGNOSIS — M85831 Other specified disorders of bone density and structure, right forearm: Secondary | ICD-10-CM | POA: Diagnosis not present

## 2016-12-03 DIAGNOSIS — Z1231 Encounter for screening mammogram for malignant neoplasm of breast: Secondary | ICD-10-CM | POA: Diagnosis not present

## 2016-12-03 LAB — HM MAMMOGRAPHY

## 2016-12-04 ENCOUNTER — Encounter: Payer: Self-pay | Admitting: Family Medicine

## 2016-12-04 ENCOUNTER — Encounter: Payer: Self-pay | Admitting: Podiatry

## 2016-12-04 ENCOUNTER — Ambulatory Visit (INDEPENDENT_AMBULATORY_CARE_PROVIDER_SITE_OTHER): Payer: PPO | Admitting: Podiatry

## 2016-12-04 DIAGNOSIS — M2170 Unequal limb length (acquired), unspecified site: Secondary | ICD-10-CM

## 2016-12-06 NOTE — Progress Notes (Signed)
   HPI: 83 year old female presenting today with a complaint of her right leg being shorter than the left. She states she may need a shoe lift. She also reports neuropathy complaining of numbness to the feet. She is not diabetic. She is here for further evaluation and treatment.   Past Medical History:  Diagnosis Date  . Cataract   . Fatty liver disease, nonalcoholic    2007- admitted with encephalopathy unsure of cause, resolved sponatneously  . Hypertension      Physical Exam: General: The patient is alert and oriented x3 in no acute distress.  Dermatology: Skin is warm, dry and supple bilateral lower extremities. Negative for open lesions or macerations.  Vascular: Palpable pedal pulses bilaterally. No edema or erythema noted. Capillary refill within normal limits.  Neurological: Epicritic and protective threshold grossly intact bilaterally.   Musculoskeletal Exam: Range of motion within normal limits to all pedal and ankle joints bilateral. Muscle strength 5/5 in all groups bilateral.   Bilateral lower extremities comparison has the right lower extremity approximately three-eighths of an inch shorter than the left lower extremity     Assessment: - Limb LD right shorter 3/8" than left   Plan of Care:  - Recommended shoe  Modifications with altered heel  Lift to compensate for the limb length discrepancy - Return to clinic when necessary.     Felecia ShellingBrent M. Quintana Canelo, DPM [facility]  Dr. Felecia ShellingBrent M. Alver Leete, DPM    [address]                                        [place], [place] [id number]                Office [phone]  Fax [phone]

## 2017-01-03 ENCOUNTER — Other Ambulatory Visit: Payer: Self-pay | Admitting: Family Medicine

## 2017-01-03 DIAGNOSIS — M5136 Other intervertebral disc degeneration, lumbar region: Secondary | ICD-10-CM

## 2017-01-03 NOTE — Telephone Encounter (Signed)
okay

## 2017-01-03 NOTE — Telephone Encounter (Signed)
Ok to refill??  Last office visit 11/07/2016.  Last refill 09/30/2016, #1 refill.

## 2017-01-03 NOTE — Telephone Encounter (Signed)
Medication called to pharmacy. 

## 2017-01-06 NOTE — Telephone Encounter (Signed)
Ok uses sparingly for her back.w

## 2017-01-06 NOTE — Telephone Encounter (Signed)
Ok to refill 

## 2017-02-04 ENCOUNTER — Other Ambulatory Visit: Payer: Self-pay | Admitting: Family Medicine

## 2017-02-04 DIAGNOSIS — F5101 Primary insomnia: Secondary | ICD-10-CM

## 2017-02-05 NOTE — Telephone Encounter (Signed)
Ok to refill??  Last office visit 11/07/2016.  Last refill 11/20/2016, #1 refills.

## 2017-03-03 ENCOUNTER — Telehealth: Payer: Self-pay | Admitting: Family Medicine

## 2017-03-03 MED ORDER — LISINOPRIL-HYDROCHLOROTHIAZIDE 20-12.5 MG PO TABS: 1.0000 | ORAL_TABLET | Freq: Every day | ORAL | 3 refills | Status: DC

## 2017-03-03 NOTE — Telephone Encounter (Signed)
Med sent to pharm and pt aware via vm 

## 2017-03-03 NOTE — Telephone Encounter (Signed)
pts losartan is on back order with pharmacy and they do not know when they will get a shipment in pt is out of bp medication please call her.

## 2017-03-03 NOTE — Telephone Encounter (Signed)
Given issue with back order, would suggest switching to lisinopril hct 20/12.5 poqday instead.

## 2017-03-03 NOTE — Telephone Encounter (Signed)
You want to change it or is it ok to break it down into 2 rx's??

## 2017-04-04 ENCOUNTER — Other Ambulatory Visit: Payer: Self-pay | Admitting: Family Medicine

## 2017-04-04 DIAGNOSIS — F5101 Primary insomnia: Secondary | ICD-10-CM

## 2017-04-04 NOTE — Telephone Encounter (Signed)
Ok to refill??  Last office visit 11/07/2016.  Last refill 02/06/2017, #1 refill.

## 2017-04-24 ENCOUNTER — Other Ambulatory Visit: Payer: Self-pay | Admitting: Family Medicine

## 2017-04-24 DIAGNOSIS — M5136 Other intervertebral disc degeneration, lumbar region: Secondary | ICD-10-CM

## 2017-06-05 ENCOUNTER — Ambulatory Visit (INDEPENDENT_AMBULATORY_CARE_PROVIDER_SITE_OTHER): Payer: PPO | Admitting: Family Medicine

## 2017-06-05 ENCOUNTER — Encounter: Payer: Self-pay | Admitting: Family Medicine

## 2017-06-05 ENCOUNTER — Other Ambulatory Visit: Payer: Self-pay | Admitting: Family Medicine

## 2017-06-05 VITALS — BP 132/64 | HR 80 | Temp 97.8°F | Resp 14 | Ht 63.0 in | Wt 156.0 lb

## 2017-06-05 DIAGNOSIS — I10 Essential (primary) hypertension: Secondary | ICD-10-CM

## 2017-06-05 DIAGNOSIS — M8580 Other specified disorders of bone density and structure, unspecified site: Secondary | ICD-10-CM | POA: Diagnosis not present

## 2017-06-05 DIAGNOSIS — R5383 Other fatigue: Secondary | ICD-10-CM

## 2017-06-05 DIAGNOSIS — F5101 Primary insomnia: Secondary | ICD-10-CM

## 2017-06-05 DIAGNOSIS — F32A Depression, unspecified: Secondary | ICD-10-CM

## 2017-06-05 DIAGNOSIS — F321 Major depressive disorder, single episode, moderate: Secondary | ICD-10-CM

## 2017-06-05 DIAGNOSIS — M5136 Other intervertebral disc degeneration, lumbar region: Secondary | ICD-10-CM | POA: Diagnosis not present

## 2017-06-05 DIAGNOSIS — F329 Major depressive disorder, single episode, unspecified: Secondary | ICD-10-CM | POA: Diagnosis not present

## 2017-06-05 LAB — VITAMIN B12: Vitamin B-12: 338 pg/mL (ref 200–1100)

## 2017-06-05 LAB — COMPLETE METABOLIC PANEL WITH GFR
AG RATIO: 1.7 (calc) (ref 1.0–2.5)
ALT: 17 U/L (ref 6–29)
AST: 19 U/L (ref 10–35)
Albumin: 4.1 g/dL (ref 3.6–5.1)
Alkaline phosphatase (APISO): 57 U/L (ref 33–130)
BILIRUBIN TOTAL: 0.4 mg/dL (ref 0.2–1.2)
BUN/Creatinine Ratio: 32 (calc) — ABNORMAL HIGH (ref 6–22)
BUN: 55 mg/dL — ABNORMAL HIGH (ref 7–25)
CHLORIDE: 112 mmol/L — AB (ref 98–110)
CO2: 23 mmol/L (ref 20–32)
Calcium: 9.6 mg/dL (ref 8.6–10.4)
Creat: 1.74 mg/dL — ABNORMAL HIGH (ref 0.60–0.88)
GFR, Est African American: 31 mL/min/{1.73_m2} — ABNORMAL LOW (ref >=60)
GFR, Est Non African American: 27 mL/min/{1.73_m2} — ABNORMAL LOW (ref >=60)
Globulin: 2.4 g/dL (calc) (ref 1.9–3.7)
Glucose, Bld: 115 mg/dL — ABNORMAL HIGH (ref 65–99)
POTASSIUM: 5.9 mmol/L — AB (ref 3.5–5.3)
Sodium: 141 mmol/L (ref 135–146)
Total Protein: 6.5 g/dL (ref 6.1–8.1)

## 2017-06-05 LAB — CBC WITH DIFFERENTIAL/PLATELET
BASOS ABS: 48 {cells}/uL (ref 0–200)
Basophils Relative: 0.8 %
EOS ABS: 258 {cells}/uL (ref 15–500)
Eosinophils Relative: 4.3 %
HEMATOCRIT: 32.8 % — AB (ref 35.0–45.0)
Hemoglobin: 11 g/dL — ABNORMAL LOW (ref 11.7–15.5)
Lymphs Abs: 1350 cells/uL (ref 850–3900)
MCH: 31.3 pg (ref 27.0–33.0)
MCHC: 33.5 g/dL (ref 32.0–36.0)
MCV: 93.4 fL (ref 80.0–100.0)
MPV: 10.6 fL (ref 7.5–12.5)
Monocytes Relative: 8.7 %
NEUTROS PCT: 63.7 %
Neutro Abs: 3822 cells/uL (ref 1500–7800)
PLATELETS: 183 10*3/uL (ref 140–400)
RBC: 3.51 10*6/uL — ABNORMAL LOW (ref 3.80–5.10)
RDW: 13.3 % (ref 11.0–15.0)
TOTAL LYMPHOCYTE: 22.5 %
WBC: 6 10*3/uL (ref 3.8–10.8)
WBCMIX: 522 {cells}/uL (ref 200–950)

## 2017-06-05 LAB — LIPID PANEL
Cholesterol: 195 mg/dL (ref <200)
HDL: 44 mg/dL — AB (ref >50)
LDL Cholesterol (Calc): 129 mg/dL (calc) — ABNORMAL HIGH
Non-HDL Cholesterol (Calc): 151 mg/dL (calc) — ABNORMAL HIGH (ref <130)
TRIGLYCERIDES: 116 mg/dL (ref <150)
Total CHOL/HDL Ratio: 4.4 (calc) (ref <5.0)

## 2017-06-05 LAB — TSH: TSH: 2.05 m[IU]/L (ref 0.40–4.50)

## 2017-06-05 MED ORDER — TRAMADOL HCL 50 MG PO TABS: 50.0000 mg | ORAL_TABLET | Freq: Three times a day (TID) | ORAL | 1 refills | Status: DC | PRN

## 2017-06-05 MED ORDER — ESCITALOPRAM OXALATE 10 MG PO TABS: 10.0000 mg | ORAL_TABLET | Freq: Every day | ORAL | 5 refills | Status: DC

## 2017-06-05 MED ORDER — ALPRAZOLAM 0.5 MG PO TABS: ORAL_TABLET | ORAL | 1 refills | Status: DC

## 2017-06-05 NOTE — Progress Notes (Signed)
Subjective:    Patient ID: [name], female    DOB: [birth year], 84 y.o.   MRN: [id number]  HPI 10/31/16 Patient is a very pleasant 84 year old Caucasian female who presents today complaining of pain in her mid back mainly on the left side. What is very noticeable is that the patient is not standing straight. She has noticeable dextroscoliosis of a right curvature which seems to be voluntary of her lower back. She appears to be in significant pain and this seems to be compensatory. She states the pain began after she fell late this spring. I'm concerned that she may have a vertebral fracture causing a wedge type deformity creating the dextroscoliosis in the lumbar spine with a levoscoliosis in the thoracic spine which is noticeable today but the patient was unaware. She has no tenderness to palpation however over the spinous processes or over her ribs. She denies any numbness or tingling in her legs. She denies any weakness in her legs. She does report severe depression. She reports severe fatigue. She states that since her husband died she doesn't even want to be around people. She doesn't want to leave the home. She has no desire to do anything. Her blood pressure today is extremely high.  At that time, my plan was: Patient has a visible deformity in the way she is holding her body in her spine. This seems to be secondary and volitional due to pain and spasm. I'm concerned that with the father spring, she may have suffered some type of wedge type vertebral fracture that could be causing this deformity. Therefore I want to send the patient immediately for x-rays of her lumbar and thoracic spine to evaluate further. If this is in fact the case, I will consult neurosurgery regarding kyphoplasty. Her blood pressure today is extremely high, I will start the patient on amlodipine 10 mg a day and recheck her blood pressure next week. At that time, I would like to discuss treatment options for  depression.  11/07/16 X-ray confirmed an old T12 compression fracture and a new T11 endplate deformity suggesting osteoporotic vertebral fractures. I recommended that the patient come back in today to discuss prolia to prevent future osteoporotic fractures and to discuss a referral to orthopedics for possible kyphoplasty. However fortunately, the patient's low back pain has completely subsided. She has discovered that her right leg is much shorter than her left leg due to previous surgery. Therefore she has a leg length discrepancy that seem to be triggering the muscle spasms in her back that was causing her back pain. She has created a lift put in her shoe which has since improved her back pain. She would like to see a podiatrist to have a formal orthotic made for her shoes that she can use long-term because this is helped her back pain substantially.  At that time, my plan was: Patient's back pain was due to muscle spasms. At first I thought it was due to vertebral fractures that it seems to be due to leg length discrepancy. I am perfectly happy with her seeing a podiatrist to have a formal orthotic made for her shoes that were correct for the leg length discrepancy that she could use permanently. However she does have vertebral fractures. At the present time she does not require kyphoplasty as her pain is better but I would like to start her on prolia for her osteoporotic vertebral fractures to prevent future ones. Her blood pressure today is much better. She admits that  she had not been taking her losartan hydrochlorothiazide. She has since resuming the medication and her blood pressure is better. She is not taking amlodipine. Therefore I asked her to be compliant with her medication and to check her blood pressure everyday and notify me of the values in 2 weeks. If high, we will add amlodipine at that time  06/05/17 Patient is here today to recheck her blood pressure on her antihypertensive medication.  Her  blood pressure today is well controlled at 132/64.  However she reports worsening fatigue.  She states that she does not want to leave her home.  She has no desire to talk on the phone.  She has very little desire to get out of bed.  She is tearful as she describes this to me.  She has trouble sleeping.  In addition to the poor energy, she reports poor concentration.  She denies any suicidal ideation but she does admit to feeling depressed.  She denies any chest pain shortness of breath or dyspnea on exertion.  She denies any hemoptysis or cough.  She denies any nausea vomiting or diarrhea.  She denies any melena or hematochezia.  Her weight is down 3 pounds but is otherwise stable.  She denies any night sweats or fevers.  She is here today for her second Prolia injection.  Her last bone density test showed osteopenia in 2016 however she suffered to compression fractures at T11 and T12 without cause qualifying the patient for osteoporosis. Past Medical History:  Diagnosis Date  . Cataract   . Fatty liver disease, nonalcoholic    2007- admitted with encephalopathy unsure of cause, resolved sponatneously  . Hypertension    Past Surgical History:  Procedure Laterality Date  . FRACTURE SURGERY     right shoulder, both wrists   . JOINT REPLACEMENT     HIP   Current Outpatient Medications on File Prior to Visit  Medication Sig Dispense Refill  . diclofenac (VOLTAREN) 75 MG EC tablet TAKE 1 TABLET BY MOUTH TWICE DAILY 60 tablet 5  . lisinopril-hydrochlorothiazide (ZESTORETIC) 20-12.5 MG tablet Take 1 tablet by mouth daily. 90 tablet 3  . losartan-hydrochlorothiazide (HYZAAR) 50-12.5 MG tablet Take 1 tablet by mouth daily. 90 tablet 3  . Naproxen Sodium (ALEVE PO) Take by mouth.     Current Facility-Administered Medications on File Prior to Visit  Medication Dose Route Frequency Provider Last Rate Last Dose  . denosumab (PROLIA) injection 60 mg  60 mg Subcutaneous Q6 months Donita Brooks, MD   60  mg at 06/05/17 0818   Allergies  Allergen Reactions  . Pneumococcal Vaccines Other (See Comments)    Pt had localized reaction to injection of Pneumococcal 23 - Swelling and redness in upper arm that extend to but not beyond elbow   Social History   Socioeconomic History  . Marital status: Married    Spouse name: Not on file  . Number of children: Not on file  . Years of education: Not on file  . Highest education level: Not on file  Occupational History  . Not on file  Social Needs  . Financial resource strain: Not on file  . Food insecurity:    Worry: Not on file    Inability: Not on file  . Transportation needs:    Medical: Not on file    Non-medical: Not on file  Tobacco Use  . Smoking status: Never Smoker  . Smokeless tobacco: Never Used  Substance and Sexual Activity  .  Alcohol use: No  . Drug use: No  . Sexual activity: Not Currently    Comment: raised tobacco, retired  Lifestyle  . Physical activity:    Days per week: Not on file    Minutes per session: Not on file  . Stress: Not on file  Relationships  . Social connections:    Talks on phone: Not on file    Gets together: Not on file    Attends religious service: Not on file    Active member of club or organization: Not on file    Attends meetings of clubs or organizations: Not on file    Relationship status: Not on file  . Intimate partner violence:    Fear of current or ex partner: Not on file    Emotionally abused: Not on file    Physically abused: Not on file    Forced sexual activity: Not on file  Other Topics Concern  . Not on file  Social History Narrative  . Not on file      Review of Systems  All other systems reviewed and are negative.      Objective:   Physical Exam  Constitutional: She appears well-developed and well-nourished.  Cardiovascular: Normal rate, regular rhythm and normal heart sounds.  No murmur heard. Pulmonary/Chest: Effort normal and breath sounds normal. No  respiratory distress. She has no wheezes. She has no rales.  Abdominal: Soft. Bowel sounds are normal. She exhibits no distension. There is no tenderness. There is no rebound and no guarding.  Musculoskeletal: She exhibits no edema.       Thoracic back: She exhibits decreased range of motion, tenderness, pain and spasm.       Lumbar back: She exhibits decreased range of motion, tenderness, pain and spasm. She exhibits no bony tenderness.       Back:  Neurological: She has normal strength. A sensory deficit is present. Coordination and gait normal.  Vitals reviewed.      Assessment & Plan:  Osteopenia, unspecified location  Benign essential HTN  Depression, major, single episode, moderate (HCC)  Fatigue due to depression - Plan: CBC with Differential/Platelet, COMPLETE METABOLIC PANEL WITH GFR, Lipid panel, TSH, Vitamin B12  DDD (degenerative disc disease), lumbar - Plan: traMADol (ULTRAM) 50 MG tablet  Primary insomnia - Plan: ALPRAZolam (XANAX) 0.5 MG tablet  She continues to complain of pain in her lower back now unrelieved by tramadol twice a day.  Tylenol provides her very little relief.  However I deferred that discussion due to her severe fatigue.  I will check a CBC, CMP, TSH, and vitamin B12.  Because of her hypertension, I will also check a cholesterol panel.  If lab work is normal as I suspect that it will be, I will treat her depression with Lexapro 10 mg a day and then reassess the patient in 4 to 5 weeks for improvement.  Continue Xanax and tramadol at the present time.  She can use Xanax for anxiety and insomnia.  She can use tramadol for low back pain.  I truly believe the patient's fatigue is related to her depression.

## 2017-06-05 NOTE — Telephone Encounter (Signed)
Resend rx - did not go through on 06/05/17

## 2017-06-06 ENCOUNTER — Other Ambulatory Visit: Payer: Self-pay | Admitting: Family Medicine

## 2017-06-06 DIAGNOSIS — F5101 Primary insomnia: Secondary | ICD-10-CM

## 2017-06-06 DIAGNOSIS — M5136 Other intervertebral disc degeneration, lumbar region: Secondary | ICD-10-CM

## 2017-06-06 MED ORDER — ALPRAZOLAM 0.5 MG PO TABS: ORAL_TABLET | ORAL | 1 refills | Status: DC

## 2017-06-06 MED ORDER — TRAMADOL HCL 50 MG PO TABS: 50.0000 mg | ORAL_TABLET | Freq: Three times a day (TID) | ORAL | 1 refills | Status: DC | PRN

## 2017-06-12 ENCOUNTER — Other Ambulatory Visit: Payer: PPO

## 2017-06-12 DIAGNOSIS — N289 Disorder of kidney and ureter, unspecified: Secondary | ICD-10-CM | POA: Diagnosis not present

## 2017-06-12 LAB — BASIC METABOLIC PANEL
BUN/Creatinine Ratio: 27 (calc) — ABNORMAL HIGH (ref 6–22)
BUN: 35 mg/dL — ABNORMAL HIGH (ref 7–25)
CALCIUM: 8.8 mg/dL (ref 8.6–10.4)
CO2: 21 mmol/L (ref 20–32)
Chloride: 111 mmol/L — ABNORMAL HIGH (ref 98–110)
Creat: 1.3 mg/dL — ABNORMAL HIGH (ref 0.60–0.88)
GLUCOSE: 100 mg/dL — AB (ref 65–99)
POTASSIUM: 5.2 mmol/L (ref 3.5–5.3)
SODIUM: 140 mmol/L (ref 135–146)

## 2017-07-10 ENCOUNTER — Ambulatory Visit (INDEPENDENT_AMBULATORY_CARE_PROVIDER_SITE_OTHER): Payer: PPO | Admitting: Family Medicine

## 2017-07-10 VITALS — BP 130/70 | HR 62 | Temp 98.1°F | Resp 14 | Ht 63.0 in | Wt 155.0 lb

## 2017-07-10 DIAGNOSIS — F321 Major depressive disorder, single episode, moderate: Secondary | ICD-10-CM

## 2017-07-10 DIAGNOSIS — N179 Acute kidney failure, unspecified: Secondary | ICD-10-CM | POA: Diagnosis not present

## 2017-07-10 DIAGNOSIS — I10 Essential (primary) hypertension: Secondary | ICD-10-CM

## 2017-07-10 LAB — BASIC METABOLIC PANEL WITH GFR
BUN/Creatinine Ratio: 24 (calc) — ABNORMAL HIGH (ref 6–22)
BUN: 31 mg/dL — ABNORMAL HIGH (ref 7–25)
CHLORIDE: 108 mmol/L (ref 98–110)
CO2: 24 mmol/L (ref 20–32)
Calcium: 9.2 mg/dL (ref 8.6–10.4)
Creat: 1.29 mg/dL — ABNORMAL HIGH (ref 0.60–0.88)
GFR, Est African American: 44 mL/min/{1.73_m2} — ABNORMAL LOW (ref >=60)
GFR, Est Non African American: 38 mL/min/{1.73_m2} — ABNORMAL LOW (ref >=60)
GLUCOSE: 91 mg/dL (ref 65–99)
POTASSIUM: 5.2 mmol/L (ref 3.5–5.3)
SODIUM: 138 mmol/L (ref 135–146)

## 2017-07-10 LAB — EXTRA LAV TOP TUBE

## 2017-07-10 NOTE — Progress Notes (Signed)
Subjective:    Patient ID: [name], female    DOB: 11/15/1931, 84 y.o.   MRN: [id number]  Medication Refill    10/31/16 Patient is a very pleasant 84 year old Caucasian female who presents today complaining of pain in her mid back mainly on the left side. What is very noticeable is that the patient is not standing straight. She has noticeable dextroscoliosis of a right curvature which seems to be voluntary of her lower back. She appears to be in significant pain and this seems to be compensatory. She states the pain began after she fell late this spring. I'm concerned that she may have a vertebral fracture causing a wedge type deformity creating the dextroscoliosis in the lumbar spine with a levoscoliosis in the thoracic spine which is noticeable today but the patient was unaware. She has no tenderness to palpation however over the spinous processes or over her ribs. She denies any numbness or tingling in her legs. She denies any weakness in her legs. She does report severe depression. She reports severe fatigue. She states that since her husband died she doesn't even want to be around people. She doesn't want to leave the home. She has no desire to do anything. Her blood pressure today is extremely high.  At that time, my plan was: Patient has a visible deformity in the way she is holding her body in her spine. This seems to be secondary and volitional due to pain and spasm. I'm concerned that with the father spring, she may have suffered some type of wedge type vertebral fracture that could be causing this deformity. Therefore I want to send the patient immediately for x-rays of her lumbar and thoracic spine to evaluate further. If this is in fact the case, I will consult neurosurgery regarding kyphoplasty. Her blood pressure today is extremely high, I will start the patient on amlodipine 10 mg a day and recheck her blood pressure next week. At that time, I would like to discuss treatment  options for depression.  11/07/16 X-ray confirmed an old T12 compression fracture and a new T11 endplate deformity suggesting osteoporotic vertebral fractures. I recommended that the patient come back in today to discuss prolia to prevent future osteoporotic fractures and to discuss a referral to orthopedics for possible kyphoplasty. However fortunately, the patient's low back pain has completely subsided. She has discovered that her right leg is much shorter than her left leg due to previous surgery. Therefore she has a leg length discrepancy that seem to be triggering the muscle spasms in her back that was causing her back pain. She has created a lift put in her shoe which has since improved her back pain. She would like to see a podiatrist to have a formal orthotic made for her shoes that she can use long-term because this is helped her back pain substantially.  At that time, my plan was: Patient's back pain was due to muscle spasms. At first I thought it was due to vertebral fractures that it seems to be due to leg length discrepancy. I am perfectly happy with her seeing a podiatrist to have a formal orthotic made for her shoes that were correct for the leg length discrepancy that she could use permanently. However she does have vertebral fractures. At the present time she does not require kyphoplasty as her pain is better but I would like to start her on prolia for her osteoporotic vertebral fractures to prevent future ones. Her blood pressure today is much  better. She admits that she had not been taking her losartan hydrochlorothiazide. She has since resuming the medication and her blood pressure is better. She is not taking amlodipine. Therefore I asked her to be compliant with her medication and to check her blood pressure everyday and notify me of the values in 2 weeks. If high, we will add amlodipine at that time  06/05/17 Patient is here today to recheck her blood pressure on her antihypertensive  medication.  Her blood pressure today is well controlled at 132/64.  However she reports worsening fatigue.  She states that she does not want to leave her home.  She has no desire to talk on the phone.  She has very little desire to get out of bed.  She is tearful as she describes this to me.  She has trouble sleeping.  In addition to the poor energy, she reports poor concentration.  She denies any suicidal ideation but she does admit to feeling depressed.  She denies any chest pain shortness of breath or dyspnea on exertion.  She denies any hemoptysis or cough.  She denies any nausea vomiting or diarrhea.  She denies any melena or hematochezia.  Her weight is down 3 pounds but is otherwise stable.  She denies any night sweats or fevers.  She is here today for her second Prolia injection.  Her last bone density test showed osteopenia in 2016 however she suffered to compression fractures at T11 and T12 without cause qualifying the patient for osteoporosis.  At that time, my plan was: She continues to complain of pain in her lower back now unrelieved by tramadol twice a day.  Tylenol provides her very little relief.  However I deferred that discussion due to her severe fatigue.  I will check a CBC, CMP, TSH, and vitamin B12.  Because of her hypertension, I will also check a cholesterol panel.  If lab work is normal as I suspect that it will be, I will treat her depression with Lexapro 10 mg a day and then reassess the patient in 4 to 5 weeks for improvement.  Continue Xanax and tramadol at the present time.  She can use Xanax for anxiety and insomnia.  She can use tramadol for low back pain.  I truly believe the patient's fatigue is related to her depression.  07/10/17 Her lab work at that time revealed a creatinine of 1.7 suggesting prerenal azotemia.  I asked her to hold her blood pressure medication due to her elevated potassium and drink more water.  She returned 1 week later and her creatinine had improved to  1.3 and her potassium had dropped to 5.2.  She remains off her blood pressure medication and her blood pressure is still 130/70.  She is still not drinking as much water as she should but she feels a whole lot better.  Furthermore the anti-depressant medication, Lexapro, has helped her depression immensely.  She states that she is sleeping better than she ever has.  Primarily the medication is helping "turn her mind off at night" so that she can go to sleep easier.  She denies any side effects on the medication particularly nausea vomiting or diarrhea.  Her energy level is improved.  She states that she is more active.  She states that her back pain even feels better. Past Medical History:  Diagnosis Date  . Cataract   . Fatty liver disease, nonalcoholic    2007- admitted with encephalopathy unsure of cause, resolved sponatneously  .  Hypertension    Past Surgical History:  Procedure Laterality Date  . FRACTURE SURGERY     right shoulder, both wrists   . JOINT REPLACEMENT     HIP   Current Outpatient Medications on File Prior to Visit  Medication Sig Dispense Refill  . ALPRAZolam (XANAX) 0.5 MG tablet TAKE 1 TO 2 TABLETS BY MOUTH EVERY 8 HOURS AS NEEDED FOR ANXIETY (MUST LAST 30 DAYS) 60 tablet 1  . diclofenac (VOLTAREN) 75 MG EC tablet TAKE 1 TABLET BY MOUTH TWICE DAILY 60 tablet 5  . escitalopram (LEXAPRO) 10 MG tablet Take 1 tablet (10 mg total) by mouth daily. 30 tablet 5  . Naproxen Sodium (ALEVE PO) Take by mouth.    . traMADol (ULTRAM) 50 MG tablet Take 1 tablet (50 mg total) by mouth every 8 (eight) hours as needed. for pain 60 tablet 1   Current Facility-Administered Medications on File Prior to Visit  Medication Dose Route Frequency Provider Last Rate Last Dose  . denosumab (PROLIA) injection 60 mg  60 mg Subcutaneous Q6 months Donita Brooks, MD   60 mg at 06/05/17 0818   Allergies  Allergen Reactions  . Pneumococcal Vaccines Other (See Comments)    Pt had localized  reaction to injection of Pneumococcal 23 - Swelling and redness in upper arm that extend to but not beyond elbow   Social History   Socioeconomic History  . Marital status: Married    Spouse name: Not on file  . Number of children: Not on file  . Years of education: Not on file  . Highest education level: Not on file  Occupational History  . Not on file  Social Needs  . Financial resource strain: Not on file  . Food insecurity:    Worry: Not on file    Inability: Not on file  . Transportation needs:    Medical: Not on file    Non-medical: Not on file  Tobacco Use  . Smoking status: Never Smoker  . Smokeless tobacco: Never Used  Substance and Sexual Activity  . Alcohol use: No  . Drug use: No  . Sexual activity: Not Currently    Comment: raised tobacco, retired  Lifestyle  . Physical activity:    Days per week: Not on file    Minutes per session: Not on file  . Stress: Not on file  Relationships  . Social connections:    Talks on phone: Not on file    Gets together: Not on file    Attends religious service: Not on file    Active member of club or organization: Not on file    Attends meetings of clubs or organizations: Not on file    Relationship status: Not on file  . Intimate partner violence:    Fear of current or ex partner: Not on file    Emotionally abused: Not on file    Physically abused: Not on file    Forced sexual activity: Not on file  Other Topics Concern  . Not on file  Social History Narrative  . Not on file      Review of Systems  All other systems reviewed and are negative.      Objective:   Physical Exam  Constitutional: She appears well-developed and well-nourished.  Cardiovascular: Normal rate, regular rhythm and normal heart sounds.  No murmur heard. Pulmonary/Chest: Effort normal and breath sounds normal. No respiratory distress. She has no wheezes. She has no rales.  Abdominal: Soft. Bowel  sounds are normal. She exhibits no  distension. There is no tenderness. There is no rebound and no guarding.  Musculoskeletal: She exhibits no edema.       Thoracic back: She exhibits decreased range of motion, tenderness, pain and spasm.       Lumbar back: She exhibits decreased range of motion, tenderness, pain and spasm. She exhibits no bony tenderness.       Back:  Neurological: She has normal strength. A sensory deficit is present. Coordination and gait normal.  Vitals reviewed.      Assessment & Plan:  Benign essential HTN - Plan: BASIC METABOLIC PANEL WITH GFR  Depression, major, single episode, moderate (HCC)  AKI (acute kidney injury) (HCC)  I am extremely happy that her depression is better and her fatigue is better.  I would like to continue Lexapro for at least 6 months and then reconsider continuing the medication indefinitely versus discontinuation.  Patient is comfortable with taking the medication for 6 months and then will reconsider.  I am concerned about her acute kidney injury.  Creatinine was better.  I will repeat that again today.  We will need to monitor this every 4 months.  I encouraged the patient to drink 3 to 4 glasses of water per day and to invest in a blood pressure cuff and check her blood pressure every day.  Her goal blood pressure is less than 140/90 to prevent worsening kidney function.  She will call me immediately if her blood pressures greater than 140/90.  Otherwise follow-up again in 4 months

## 2017-08-28 ENCOUNTER — Ambulatory Visit: Payer: PPO | Admitting: Family Medicine

## 2017-10-03 ENCOUNTER — Telehealth: Payer: Self-pay | Admitting: Family Medicine

## 2017-10-03 NOTE — Telephone Encounter (Signed)
Patient called [facility] stating that her BP was doing good up until this last week since stopping the BP med. For the last few days her BP has been 150/60,161/70 and wanted to know if she needs to go back on her BP medication.

## 2017-10-07 NOTE — Telephone Encounter (Signed)
I would start hctz 12.5 mg poqday.

## 2017-10-07 NOTE — Telephone Encounter (Signed)
[facility]

## 2017-10-08 NOTE — Telephone Encounter (Signed)
Called pt and she states that she took one of the BP pills Dr. Tanya Fox had given her and her BP has now come back down. If it goes back up again she will call and make an appointment.

## 2017-10-09 ENCOUNTER — Other Ambulatory Visit: Payer: Self-pay | Admitting: Family Medicine

## 2017-10-09 DIAGNOSIS — F5101 Primary insomnia: Secondary | ICD-10-CM

## 2017-10-09 DIAGNOSIS — M5136 Other intervertebral disc degeneration, lumbar region: Secondary | ICD-10-CM

## 2017-10-09 NOTE — Telephone Encounter (Signed)
Requesting refill      LOV: 07/10/17  LRF:  06/06/17

## 2017-10-20 ENCOUNTER — Ambulatory Visit (INDEPENDENT_AMBULATORY_CARE_PROVIDER_SITE_OTHER): Payer: PPO | Admitting: Family Medicine

## 2017-10-20 ENCOUNTER — Encounter: Payer: Self-pay | Admitting: Family Medicine

## 2017-10-20 VITALS — BP 128/70 | HR 70 | Temp 98.2°F | Resp 16 | Ht 63.0 in | Wt 160.0 lb

## 2017-10-20 DIAGNOSIS — M25561 Pain in right knee: Secondary | ICD-10-CM

## 2017-10-20 DIAGNOSIS — M25571 Pain in right ankle and joints of right foot: Secondary | ICD-10-CM

## 2017-10-20 DIAGNOSIS — I10 Essential (primary) hypertension: Secondary | ICD-10-CM | POA: Diagnosis not present

## 2017-10-20 MED ORDER — AMLODIPINE BESYLATE 5 MG PO TABS: 5.0000 mg | ORAL_TABLET | Freq: Every day | ORAL | 3 refills | Status: DC

## 2017-10-20 NOTE — Progress Notes (Signed)
Subjective:    Patient ID: [name], female    DOB: 12-02-1931, 84 y.o.   MRN: [id number]  HPI  Patient presents today complaining of elevated blood pressure.  She states that over the weekend, her blood pressure was as high as 170 systolic over 90 diastolic.  It has been steadily rising over the last few weeks.  She denies any chest pain shortness of breath or dyspnea on exertion.  She became alarmed and therefore this weekend she took 1 of her old blood pressure medications, losartan HCTZ.  Her blood pressure today is well controlled however she has taken her old medication twice over the weekend.  We previously discontinued the medication due to acute kidney injury in the early summer with a rise in her creatinine to 1.73 as well as an elevated potassium of 5.9.  Even after discontinuing losartan HCTZ, her potassium remain borderline elevated at 5.2.  Patient also complains of knee pain and ankle pain.  Early this summer, she tripped over an exposed pipe trading from the wall of her home striking her right shin as she walked by.  She fell on her lateral right ankle as well as her right knee.  Ever since that time, she complains of pain and stiffness in the right knee particularly in the posterior lateral aspect of the right knee as well as swelling behind the knee in the popliteal fossa.  She also complains of pain distal and anterior to the lateral malleolus in the area of the anterior talofibular ligament.  Initially she can barely put weight on her foot however over the last few weeks, the pain has improved and subsided.  She still has pain with ambulation.  She also reports subjective swelling around the right knee Past Medical History:  Diagnosis Date  . Cataract   . Fatty liver disease, nonalcoholic    2007- admitted with encephalopathy unsure of cause, resolved sponatneously  . Hypertension    Past Surgical History:  Procedure Laterality Date  . FRACTURE SURGERY     right  shoulder, both wrists   . JOINT REPLACEMENT     HIP   Current Outpatient Medications on File Prior to Visit  Medication Sig Dispense Refill  . ALPRAZolam (XANAX) 0.5 MG tablet TAKE 1 TO 2 TABLETS BY MOUTH EVERY 8 HOURS AS NEEDED FOR ANXIETY (MUST  LAST  30  DAYS) 60 tablet 1  . diclofenac (VOLTAREN) 75 MG EC tablet TAKE 1 TABLET BY MOUTH TWICE DAILY 60 tablet 5  . escitalopram (LEXAPRO) 10 MG tablet Take 1 tablet (10 mg total) by mouth daily. 30 tablet 5  . Naproxen Sodium (ALEVE PO) Take by mouth.    . traMADol (ULTRAM) 50 MG tablet TAKE 1 TABLET BY MOUTH EVERY 8 HOURS AS NEEDED FOR PAIN 60 tablet 1   Current Facility-Administered Medications on File Prior to Visit  Medication Dose Route Frequency Provider Last Rate Last Dose  . denosumab (PROLIA) injection 60 mg  60 mg Subcutaneous Q6 months Donita Brooks, MD   60 mg at 06/05/17 0818   Allergies  Allergen Reactions  . Pneumococcal Vaccines Other (See Comments)    Pt had localized reaction to injection of Pneumococcal 23 - Swelling and redness in upper arm that extend to but not beyond elbow   Social History   Socioeconomic History  . Marital status: Married    Spouse name: Not on file  . Number of children: Not on file  . Years of education:  Not on file  . Highest education level: Not on file  Occupational History  . Not on file  Social Needs  . Financial resource strain: Not on file  . Food insecurity:    Worry: Not on file    Inability: Not on file  . Transportation needs:    Medical: Not on file    Non-medical: Not on file  Tobacco Use  . Smoking status: Never Smoker  . Smokeless tobacco: Never Used  Substance and Sexual Activity  . Alcohol use: No  . Drug use: No  . Sexual activity: Not Currently    Comment: raised tobacco, retired  Lifestyle  . Physical activity:    Days per week: Not on file    Minutes per session: Not on file  . Stress: Not on file  Relationships  . Social connections:    Talks on  phone: Not on file    Gets together: Not on file    Attends religious service: Not on file    Active member of club or organization: Not on file    Attends meetings of clubs or organizations: Not on file    Relationship status: Not on file  . Intimate partner violence:    Fear of current or ex partner: Not on file    Emotionally abused: Not on file    Physically abused: Not on file    Forced sexual activity: Not on file  Other Topics Concern  . Not on file  Social History Narrative  . Not on file     Review of Systems  All other systems reviewed and are negative.      Objective:   Physical Exam  Constitutional: She appears well-developed and well-nourished.  Cardiovascular: Normal rate, regular rhythm and normal heart sounds.  Pulmonary/Chest: Effort normal and breath sounds normal. No stridor. No respiratory distress. She has no wheezes. She has no rales.  Musculoskeletal:       Right knee: She exhibits decreased range of motion and effusion. She exhibits no LCL laxity, normal meniscus and no MCL laxity. Tenderness found. Lateral joint line tenderness noted. No medial joint line, no MCL and no LCL tenderness noted.       Right ankle: She exhibits normal range of motion and no swelling. Tenderness. AITFL tenderness found. No lateral malleolus and no medial malleolus tenderness found.          Assessment & Plan:  Benign essential HTN  Acute pain of right knee  Acute right ankle pain Renew losartan HCTZ due to her history of acute kidney injury on certain well as hyperkalemia.  Replace with amlodipine 5 mg a day and monitor her blood pressure.  I believe the pain in her right knee is likely due to the fall, osteoarthritis, and possibly a lateral meniscal tear although the exam is not specific today.  She cannot tolerate NSAIDs due to her mild chronic kidney disease.  Therefore I offered the patient a cortisone injection in the right knee.  She politely declined as the pain is  not that severe.  I believe the pain in her right ankle is likely due to a sprain in the anterior talofibular ligament.  This is slowly improving.  I recommended the patient wear an ASO brace with ambulation to provide more ankle support and reassess in 2 to 3 weeks or sooner if worsening.  Patient is bearing weight without any difficulty and therefore I believe the fracture is unlikely.

## 2017-12-01 ENCOUNTER — Telehealth: Payer: Self-pay | Admitting: Family Medicine

## 2017-12-01 DIAGNOSIS — M25571 Pain in right ankle and joints of right foot: Secondary | ICD-10-CM

## 2017-12-01 DIAGNOSIS — M25561 Pain in right knee: Secondary | ICD-10-CM

## 2017-12-01 NOTE — Telephone Encounter (Signed)
Left message for patient to return call to inform her that we have placed the referral. Pending appointment to be scheduled.

## 2017-12-01 NOTE — Telephone Encounter (Signed)
Absolutely.  Consult ortho.

## 2017-12-01 NOTE — Telephone Encounter (Signed)
Patient called in and stated that she is still having issues with her knee. She was last seen for it on 10/20/17. She states that she is having a hard time getting up to walk on it. She would like to know if we can refer her to Orthopedics. She has seen Dr. Darrelyn Hillock in the past. Please advise?

## 2017-12-08 DIAGNOSIS — M25561 Pain in right knee: Secondary | ICD-10-CM | POA: Insufficient documentation

## 2017-12-08 DIAGNOSIS — M25571 Pain in right ankle and joints of right foot: Secondary | ICD-10-CM | POA: Diagnosis not present

## 2017-12-08 DIAGNOSIS — M545 Low back pain: Secondary | ICD-10-CM | POA: Diagnosis not present

## 2017-12-09 ENCOUNTER — Other Ambulatory Visit: Payer: Self-pay | Admitting: Family Medicine

## 2017-12-09 DIAGNOSIS — F5101 Primary insomnia: Secondary | ICD-10-CM

## 2017-12-09 NOTE — Telephone Encounter (Signed)
Ok to refill??  Last office visit 10/20/2017.  Last refill 10/09/2017, #1 refill.

## 2017-12-09 NOTE — Telephone Encounter (Signed)
Patient was seen on 12/04/17 by Dr. Darrelyn Hillock.

## 2017-12-10 ENCOUNTER — Ambulatory Visit: Payer: PPO

## 2017-12-16 ENCOUNTER — Other Ambulatory Visit: Payer: Self-pay | Admitting: Family Medicine

## 2017-12-18 ENCOUNTER — Ambulatory Visit (INDEPENDENT_AMBULATORY_CARE_PROVIDER_SITE_OTHER): Payer: PPO

## 2017-12-18 DIAGNOSIS — M8580 Other specified disorders of bone density and structure, unspecified site: Secondary | ICD-10-CM | POA: Diagnosis not present

## 2017-12-18 DIAGNOSIS — Z23 Encounter for immunization: Secondary | ICD-10-CM

## 2017-12-18 NOTE — Progress Notes (Signed)
Patient came in today to receive annual flu vaccine and prolia. Patient received annual flu vaccine in the left deltoid. Fluzone High dose was given. Patient received prolia in the left arm. Patient tolerated both injections well. VIS given on influenza injection.

## 2017-12-23 DIAGNOSIS — Z1231 Encounter for screening mammogram for malignant neoplasm of breast: Secondary | ICD-10-CM | POA: Diagnosis not present

## 2017-12-23 LAB — HM MAMMOGRAPHY

## 2018-02-05 ENCOUNTER — Other Ambulatory Visit: Payer: Self-pay | Admitting: Family Medicine

## 2018-02-05 ENCOUNTER — Encounter: Payer: Self-pay | Admitting: *Deleted

## 2018-02-05 DIAGNOSIS — M5136 Other intervertebral disc degeneration, lumbar region: Secondary | ICD-10-CM

## 2018-02-05 NOTE — Telephone Encounter (Signed)
Ok to refill??  Last office visit 10/20/2017.  Last refill 10/09/2017, #1 refills.

## 2018-02-09 ENCOUNTER — Other Ambulatory Visit: Payer: Self-pay | Admitting: Family Medicine

## 2018-02-09 DIAGNOSIS — F5101 Primary insomnia: Secondary | ICD-10-CM

## 2018-02-09 NOTE — Telephone Encounter (Signed)
Ok to refill??  Last office visit 10/20/2017.  Last refill 12/09/2017, #1 refills

## 2018-03-10 ENCOUNTER — Other Ambulatory Visit: Payer: Self-pay | Admitting: Family Medicine

## 2018-03-10 DIAGNOSIS — F5101 Primary insomnia: Secondary | ICD-10-CM

## 2018-03-10 NOTE — Telephone Encounter (Signed)
Ok to refill??  Last office visit 10/20/2017.  Last refill 02/09/2018.

## 2018-03-17 ENCOUNTER — Other Ambulatory Visit: Payer: Self-pay | Admitting: Family Medicine

## 2018-03-17 DIAGNOSIS — M5136 Other intervertebral disc degeneration, lumbar region: Secondary | ICD-10-CM

## 2018-03-24 ENCOUNTER — Other Ambulatory Visit: Payer: Self-pay | Admitting: Family Medicine

## 2018-03-24 DIAGNOSIS — M5136 Other intervertebral disc degeneration, lumbar region: Secondary | ICD-10-CM

## 2018-03-24 NOTE — Telephone Encounter (Signed)
Ok to refill??  Last office visit 10/20/2017.   Last refill 02/05/2018.

## 2018-04-03 ENCOUNTER — Telehealth: Payer: Self-pay | Admitting: Family Medicine

## 2018-04-03 DIAGNOSIS — M5136 Other intervertebral disc degeneration, lumbar region: Secondary | ICD-10-CM

## 2018-04-03 MED ORDER — TRAMADOL HCL 50 MG PO TABS: 50.0000 mg | ORAL_TABLET | Freq: Three times a day (TID) | ORAL | 0 refills | Status: DC | PRN

## 2018-04-03 NOTE — Telephone Encounter (Signed)
[facility] called and states that the pt is needing her pain med refilled (Tramadol). Per her ins she could only get 21pills off the last RX and the other had to be discarded. She is wanting a refill - give v.o. for refill on the Tramadol #60/0.

## 2018-04-03 NOTE — Telephone Encounter (Signed)
ok 

## 2018-04-13 ENCOUNTER — Other Ambulatory Visit: Payer: Self-pay | Admitting: Family Medicine

## 2018-04-13 DIAGNOSIS — F5101 Primary insomnia: Secondary | ICD-10-CM

## 2018-04-13 NOTE — Telephone Encounter (Signed)
Pt is requesting refill on Xanax   LOV: 10/20/17  LRF:   03/10/18

## 2018-04-21 DIAGNOSIS — H903 Sensorineural hearing loss, bilateral: Secondary | ICD-10-CM | POA: Diagnosis not present

## 2018-05-13 ENCOUNTER — Other Ambulatory Visit: Payer: Self-pay | Admitting: Family Medicine

## 2018-05-13 DIAGNOSIS — F5101 Primary insomnia: Secondary | ICD-10-CM

## 2018-05-13 NOTE — Telephone Encounter (Signed)
Ok to refill??  Last office visit 10/20/2017.  Last refill 04/13/2018.

## 2018-05-25 DIAGNOSIS — M1711 Unilateral primary osteoarthritis, right knee: Secondary | ICD-10-CM | POA: Insufficient documentation

## 2018-05-25 DIAGNOSIS — M2550 Pain in unspecified joint: Secondary | ICD-10-CM | POA: Diagnosis not present

## 2018-05-25 DIAGNOSIS — M810 Age-related osteoporosis without current pathological fracture: Secondary | ICD-10-CM | POA: Diagnosis not present

## 2018-05-28 ENCOUNTER — Other Ambulatory Visit: Payer: Self-pay | Admitting: Family Medicine

## 2018-05-28 DIAGNOSIS — M170 Bilateral primary osteoarthritis of knee: Secondary | ICD-10-CM | POA: Diagnosis not present

## 2018-05-28 DIAGNOSIS — M25561 Pain in right knee: Secondary | ICD-10-CM | POA: Diagnosis not present

## 2018-05-28 DIAGNOSIS — M2550 Pain in unspecified joint: Secondary | ICD-10-CM | POA: Diagnosis not present

## 2018-05-28 DIAGNOSIS — M25562 Pain in left knee: Secondary | ICD-10-CM | POA: Diagnosis not present

## 2018-05-28 DIAGNOSIS — M5136 Other intervertebral disc degeneration, lumbar region: Secondary | ICD-10-CM

## 2018-05-28 DIAGNOSIS — M810 Age-related osteoporosis without current pathological fracture: Secondary | ICD-10-CM | POA: Diagnosis not present

## 2018-05-28 NOTE — Telephone Encounter (Signed)
Requested Prescriptions   Pending Prescriptions Disp Refills  . traMADol (ULTRAM) 50 MG tablet [Pharmacy Med Name: traMADol HCl 50 MG Oral Tablet] 60 tablet 0    Sig: TAKE 1 TABLET BY MOUTH EVERY 8 HOURS AS NEEDED FOR PAIN   Last OV 10/20/2017 Last filled 04/03/2018

## 2018-06-09 DIAGNOSIS — E559 Vitamin D deficiency, unspecified: Secondary | ICD-10-CM | POA: Diagnosis not present

## 2018-06-09 DIAGNOSIS — M170 Bilateral primary osteoarthritis of knee: Secondary | ICD-10-CM | POA: Diagnosis not present

## 2018-06-09 DIAGNOSIS — M810 Age-related osteoporosis without current pathological fracture: Secondary | ICD-10-CM | POA: Diagnosis not present

## 2018-06-15 ENCOUNTER — Other Ambulatory Visit: Payer: Self-pay | Admitting: Family Medicine

## 2018-06-15 DIAGNOSIS — F5101 Primary insomnia: Secondary | ICD-10-CM

## 2018-06-15 NOTE — Telephone Encounter (Signed)
Ok to refill??  Last office visit 10/20/2017.  Last refill 05/14/2018.

## 2018-06-24 ENCOUNTER — Ambulatory Visit: Payer: PPO

## 2018-06-24 ENCOUNTER — Ambulatory Visit: Payer: Self-pay

## 2018-07-02 ENCOUNTER — Other Ambulatory Visit: Payer: Self-pay | Admitting: Family Medicine

## 2018-07-03 NOTE — Telephone Encounter (Signed)
Requested Prescriptions   Pending Prescriptions Disp Refills  . escitalopram (LEXAPRO) 10 MG tablet [Pharmacy Med Name: Escitalopram Oxalate 10 MG Oral Tablet] 30 tablet 0    Sig: Take 1 tablet by mouth once daily   Last OV 10/20/2017 Last filled 12/23/2017

## 2018-07-08 ENCOUNTER — Ambulatory Visit (INDEPENDENT_AMBULATORY_CARE_PROVIDER_SITE_OTHER): Payer: Medicare Other | Admitting: Family Medicine

## 2018-07-08 ENCOUNTER — Other Ambulatory Visit: Payer: Self-pay

## 2018-07-08 VITALS — BP 118/68 | HR 60 | Temp 97.6°F | Resp 18 | Ht 63.0 in | Wt 156.6 lb

## 2018-07-08 DIAGNOSIS — R29898 Other symptoms and signs involving the musculoskeletal system: Secondary | ICD-10-CM

## 2018-07-08 DIAGNOSIS — I10 Essential (primary) hypertension: Secondary | ICD-10-CM | POA: Insufficient documentation

## 2018-07-08 DIAGNOSIS — M8580 Other specified disorders of bone density and structure, unspecified site: Secondary | ICD-10-CM

## 2018-07-08 DIAGNOSIS — M25562 Pain in left knee: Secondary | ICD-10-CM

## 2018-07-08 DIAGNOSIS — M5136 Other intervertebral disc degeneration, lumbar region: Secondary | ICD-10-CM | POA: Diagnosis not present

## 2018-07-08 DIAGNOSIS — M4807 Spinal stenosis, lumbosacral region: Secondary | ICD-10-CM

## 2018-07-08 DIAGNOSIS — M170 Bilateral primary osteoarthritis of knee: Secondary | ICD-10-CM | POA: Diagnosis not present

## 2018-07-08 MED ORDER — TRAMADOL HCL 50 MG PO TABS: 50.0000 mg | ORAL_TABLET | Freq: Three times a day (TID) | ORAL | 0 refills | Status: DC | PRN

## 2018-07-08 MED ORDER — DENOSUMAB 60 MG/ML ~~LOC~~ SOSY
60.0000 mg | PREFILLED_SYRINGE | Freq: Once | SUBCUTANEOUS | Status: AC
  Administered 2018-07-08: 60 mg via SUBCUTANEOUS

## 2018-07-08 NOTE — Patient Instructions (Addendum)
CTscan to be done  Xray knee  REFERRAL to Dr. Ophelia Charter

## 2018-07-08 NOTE — Progress Notes (Signed)
Subjective:    Patient ID: [name], female    DOB: October 26, 1931, 85 y.o.   MRN: [id number]  Patient presents for Leg Pain (L leg, fell on floor around May 8th, tramadol was taken)   Legs have been progressively weaker overpast few months  She had moved a chair and when she came to siet down   She fell onto the floor on her bottom and slide down on leftside, has leg pain since then- May 8th    She did her head, no HA, no LOC, feels like memory is a little off  Down neck was sore , no radiating pain into shoulder or arms, no tingling or numbess  DDD/ spinal stenosis- has been following with orthopedics, given  Taking ultram and diclofenac as well and steroid packs which is they only thing that helps  Dr. Ciro Backer last saw She has not had MRI yet,legs weaker, falling more, left leg gives out on her, no new tingling in feet   Using CBD oil on legs   Review Of Systems:  GEN- denies fatigue, fever, weight loss,weakness, recent illness HEENT- denies eye drainage, change in vision, nasal discharge, CVS- denies chest pain, palpitations RESP- denies SOB, cough, wheeze ABD- denies N/V, change in stools, abd pain GU- denies dysuria, hematuria, dribbling, incontinence MSK- + joint pain, muscle aches, injury Neuro- denies headache, dizziness, syncope, seizure activity       Objective:    BP 118/68   Pulse 60   Temp 97.6 F (36.4 C)   Resp 18   Ht 5\' 3"  (1.6 m)   Wt 156 lb 9.6 oz (71 kg)   SpO2 91%   BMI 27.74 kg/m  GEN- NAD, alert and oriented x3 HEENT- PERRL, EOMI, non injected sclera, pink conjunctiva, MMM, oropharynx clear Neck- Supple, no thyromegaly CVS- RRR, no murmur RESP-CTAB ABD-NABS,soft,NT,ND MSK0 Spine decreased ROM , + SLR, ,Left knee TTP at base, pain with flexion and extention, fair ROM HIP bilat , hip NT  Neuro- decreased strength LLE 4/5 compared to right,sensation grossly in tact, antalgic gait  EXT- No edema Pulses- Radial 2+         Assessment & Plan:      Problem List Items Addressed This Visit      Unprioritized   Essential hypertension    Controlled no changes       Relevant Orders   CBC with Differential/Platelet (Completed)   Comprehensive metabolic panel (Completed)   Osteopenia    Due for prolia injection given today       Relevant Medications   denosumab (PROLIA) injection 60 mg (Completed)    Other Visit Diagnoses    Acute pain of left knee    -  Primary   Obtain xray knee, mechanical fall 1 month ago   Primary osteoarthritis of both knees       DDD (degenerative disc disease), lumbar       Concern for worsening stenosis in setting of DDD lumbar spine, has had plain films, conservative managment by orthopedics, Prednisone, ultram, topicals She has had multiuple surgeries has metal in wrist and shoulder, so will obtain CT lumbar spine  She would also like to see Dr. Ophelia Charter for second opinion   Relevant Medications   traMADol (ULTRAM) 50 MG tablet   Bilateral leg weakness          Note: This dictation was prepared with Dragon dictation along with smaller phrase technology. Any transcriptional errors that result from this  process are unintentional.

## 2018-07-09 ENCOUNTER — Encounter: Payer: Self-pay | Admitting: Family Medicine

## 2018-07-09 LAB — COMPREHENSIVE METABOLIC PANEL
AG Ratio: 1.6 (calc) (ref 1.0–2.5)
ALT: 12 U/L (ref 6–29)
AST: 20 U/L (ref 10–35)
Albumin: 4.4 g/dL (ref 3.6–5.1)
Alkaline phosphatase (APISO): 69 U/L (ref 37–153)
BUN/Creatinine Ratio: 21 (calc) (ref 6–22)
BUN: 31 mg/dL — ABNORMAL HIGH (ref 7–25)
CO2: 26 mmol/L (ref 20–32)
Calcium: 10.2 mg/dL (ref 8.6–10.4)
Chloride: 108 mmol/L (ref 98–110)
Creat: 1.49 mg/dL — ABNORMAL HIGH (ref 0.60–0.88)
Globulin: 2.8 g/dL (calc) (ref 1.9–3.7)
Glucose, Bld: 106 mg/dL — ABNORMAL HIGH (ref 65–99)
Potassium: 5 mmol/L (ref 3.5–5.3)
Sodium: 143 mmol/L (ref 135–146)
Total Bilirubin: 0.5 mg/dL (ref 0.2–1.2)
Total Protein: 7.2 g/dL (ref 6.1–8.1)

## 2018-07-09 LAB — CBC WITH DIFFERENTIAL/PLATELET
Absolute Monocytes: 476 cells/uL (ref 200–950)
Basophils Absolute: 41 cells/uL (ref 0–200)
Basophils Relative: 0.7 %
Eosinophils Absolute: 151 cells/uL (ref 15–500)
Eosinophils Relative: 2.6 %
HCT: 39.7 % (ref 35.0–45.0)
Hemoglobin: 13.2 g/dL (ref 11.7–15.5)
Lymphs Abs: 1537 cells/uL (ref 850–3900)
MCH: 31.1 pg (ref 27.0–33.0)
MCHC: 33.2 g/dL (ref 32.0–36.0)
MCV: 93.6 fL (ref 80.0–100.0)
MPV: 10.7 fL (ref 7.5–12.5)
Monocytes Relative: 8.2 %
Neutro Abs: 3596 cells/uL (ref 1500–7800)
Neutrophils Relative %: 62 %
Platelets: 231 10*3/uL (ref 140–400)
RBC: 4.24 10*6/uL (ref 3.80–5.10)
RDW: 12.8 % (ref 11.0–15.0)
Total Lymphocyte: 26.5 %
WBC: 5.8 10*3/uL (ref 3.8–10.8)

## 2018-07-09 NOTE — Assessment & Plan Note (Signed)
Due for prolia injection given today

## 2018-07-09 NOTE — Assessment & Plan Note (Signed)
Controlled no changes 

## 2018-07-15 ENCOUNTER — Other Ambulatory Visit: Payer: Self-pay | Admitting: Family Medicine

## 2018-07-15 DIAGNOSIS — F5101 Primary insomnia: Secondary | ICD-10-CM

## 2018-07-15 NOTE — Telephone Encounter (Signed)
Ok to refill??  Last office visit 07/08/2018.  Last refill 06/15/2018.

## 2018-07-24 ENCOUNTER — Other Ambulatory Visit: Payer: Self-pay | Admitting: Family Medicine

## 2018-07-28 ENCOUNTER — Ambulatory Visit: Payer: PPO | Admitting: Orthopaedic Surgery

## 2018-07-29 ENCOUNTER — Ambulatory Visit
Admission: RE | Admit: 2018-07-29 | Discharge: 2018-07-29 | Disposition: A | Discharge status: Home / Self Care | Payer: Medicare Other | Source: Ambulatory Visit | Attending: Family Medicine | Admitting: Family Medicine

## 2018-07-29 ENCOUNTER — Other Ambulatory Visit: Payer: Self-pay

## 2018-07-29 DIAGNOSIS — M1712 Unilateral primary osteoarthritis, left knee: Secondary | ICD-10-CM | POA: Diagnosis not present

## 2018-07-29 DIAGNOSIS — M545 Low back pain: Secondary | ICD-10-CM | POA: Diagnosis not present

## 2018-07-29 DIAGNOSIS — M51369 Other intervertebral disc degeneration, lumbar region without mention of lumbar back pain or lower extremity pain: Secondary | ICD-10-CM

## 2018-07-29 DIAGNOSIS — M5136 Other intervertebral disc degeneration, lumbar region: Secondary | ICD-10-CM

## 2018-07-29 DIAGNOSIS — M4807 Spinal stenosis, lumbosacral region: Secondary | ICD-10-CM

## 2018-07-29 DIAGNOSIS — R29898 Other symptoms and signs involving the musculoskeletal system: Secondary | ICD-10-CM

## 2018-07-29 DIAGNOSIS — M25562 Pain in left knee: Secondary | ICD-10-CM

## 2018-08-04 ENCOUNTER — Other Ambulatory Visit: Payer: Self-pay

## 2018-08-04 ENCOUNTER — Ambulatory Visit: Payer: Self-pay

## 2018-08-04 ENCOUNTER — Ambulatory Visit (INDEPENDENT_AMBULATORY_CARE_PROVIDER_SITE_OTHER): Payer: Medicare Other | Admitting: Orthopaedic Surgery

## 2018-08-04 ENCOUNTER — Encounter: Payer: Self-pay | Admitting: Orthopaedic Surgery

## 2018-08-04 VITALS — Ht 63.0 in | Wt 157.0 lb

## 2018-08-04 DIAGNOSIS — M545 Low back pain, unspecified: Secondary | ICD-10-CM

## 2018-08-04 DIAGNOSIS — M170 Bilateral primary osteoarthritis of knee: Secondary | ICD-10-CM | POA: Insufficient documentation

## 2018-08-04 MED ORDER — LIDOCAINE HCL 1 % IJ SOLN
0.5000 mL | INTRAMUSCULAR | Status: AC | PRN
  Administered 2018-08-04: .5 mL

## 2018-08-04 MED ORDER — METHYLPREDNISOLONE ACETATE 40 MG/ML IJ SUSP
40.0000 mg | INTRAMUSCULAR | Status: AC | PRN
  Administered 2018-08-04: 40 mg via INTRA_ARTICULAR

## 2018-08-04 MED ORDER — BUPIVACAINE HCL 0.5 % IJ SOLN
3.0000 mL | INTRAMUSCULAR | Status: AC | PRN
  Administered 2018-08-04: 3 mL via INTRA_ARTICULAR

## 2018-08-04 MED ORDER — BUPIVACAINE HCL 0.25 % IJ SOLN
4.0000 mL | INTRAMUSCULAR | Status: AC | PRN
  Administered 2018-08-04: 4 mL via INTRA_ARTICULAR

## 2018-08-04 NOTE — Progress Notes (Signed)
Office Visit Note   Patient: [name]           Date of Birth: 05-29-1931           MRN: [id number] Visit Date: 08/04/2018              Requested by: Alycia Rossetti, MD [address] [place],  [place] [address] PCP: Susy Frizzle, MD   Assessment & Plan: Visit Diagnoses:  1. Low back pain, unspecified back pain laterality, unspecified chronicity, unspecified whether sciatica present   2. Bilateral primary osteoarthritis of knee   3.    Subacute T11 and T 12 endplate fractures.   Plan: Bilateral knee injections performed.  We will check her back in a month.  She can use some ice postinjection.  She has been using diclofenac, topical gel.  Prednisone did not give her sustained relief.  Patient lot better post knee injections.  I will check her back in a month then may consider some physical therapy at that point.  Follow-Up Instructions: Return in about 1 month (around 09/03/2018).   Orders:  Orders Placed This Encounter  Procedures  . Large Joint Inj  . Large Joint Inj  . XR Lumbar Spine 2-3 Views   No orders of the defined types were placed in this encounter.     Procedures: Large Joint Inj: R knee on 08/04/2018 2:41 PM Indications: pain and joint swelling Details: 22 G 1.5 in needle, anterolateral approach  Arthrogram: No  Medications: 40 mg methylPREDNISolone acetate 40 MG/ML; 0.5 mL lidocaine 1 %; 4 mL bupivacaine 0.25 % Outcome: tolerated well, no immediate complications Procedure, treatment alternatives, risks and benefits explained, specific risks discussed. Consent was given by the patient. Immediately prior to procedure a time out was called to verify the correct patient, procedure, equipment, support staff and site/side marked as required. Patient was prepped and draped in the usual sterile fashion.   Large Joint Inj: L knee on 08/04/2018 2:42 PM Indications: joint swelling and pain Details: 22 G 1.5 in needle, anterolateral approach   Arthrogram: No  Medications: 0.5 mL lidocaine 1 %; 3 mL bupivacaine 0.5 %; 40 mg methylPREDNISolone acetate 40 MG/ML Outcome: tolerated well, no immediate complications Procedure, treatment alternatives, risks and benefits explained, specific risks discussed. Consent was given by the patient. Immediately prior to procedure a time out was called to verify the correct patient, procedure, equipment, support staff and site/side marked as required. Patient was prepped and draped in the usual sterile fashion.       Clinical Data: No additional findings.   Subjective: Chief Complaint  Patient presents with  . Lower Back - Pain    HPI 85 year old female missed a chair about 5 weeks ago at her house stand on the floor with some pain in her back.  She has noted gradually increased problems walking with stiffness in her knees bilateral knee crepitus.  She had bilateral total hip arthroplasties by Dr. Gladstone Lighter in the past had recurrent hip dislocation on the left and has a constrained liner.  Patient has some scoliosis and lumbar disc degeneration.  Knees have been swollen painful difficulty getting from sitting to standing she is walking with short stride gait when she uses a cane or a walker she has difficulty negotiating with this and states she is more likely to fall using it that if she does not.  Normally she hangs onto furniture or the wall when she walks.  No associated bowel  bladder symptoms no fever chills.  No history of diabetes.  Review of Systems 14 point system positive for fatty liver disease, osteopenia.  Compression fracture T11-T12, bilateral total up arthroplasties recurrent dislocations on the left with constrained liner.  Bilateral knee osteoarthritis.  Otherwise negative as pertains HPI.   Objective: Vital Signs: Ht 5\' 3"  (1.6 m)   Wt 157 lb (71.2 kg)   BMI 27.81 kg/m   Physical Exam Constitutional:      Appearance: She is well-developed.  HENT:     Head: Normocephalic.      Right Ear: External ear normal.     Left Ear: External ear normal.  Eyes:     Pupils: Pupils are equal, round, and reactive to light.  Neck:     Thyroid: No thyromegaly.     Trachea: No tracheal deviation.  Cardiovascular:     Rate and Rhythm: Normal rate.  Pulmonary:     Effort: Pulmonary effort is normal.  Abdominal:     Palpations: Abdomen is soft.  Skin:    General: Skin is warm and dry.  Neurological:     Mental Status: She is alert and oriented to person, place, and time.  Psychiatric:        Behavior: Behavior normal.     Ortho Exam well-healed bilateral posterior total hip arthroplasty incisions.  She has difficulty doing a straight leg raise on the right side abductors are strong.  Bilateral knee crepitus worse on the right than left knee.  Minimal varus deformity right and left.  More medial than lateral joint line tenderness.  Distal pulses palpable.  Specialty Comments:  No specialty comments available.  Imaging: Xr Lumbar Spine 2-3 Views  Result Date: 08/04/2018 AP lateral lumbar spine x-rays are obtained and reviewed.  This shows bilateral total hip arthroplasty with a constrained liner on the left.  Some lower pelvis calcification consistent with likely calcified fibroid.  Inferior endplate at T11 fracture and superior endplate fracture [id number] unchanged from recent CT scan dated 07/29/2018.  Multilevel disc degeneration with disc space narrowing and spurring most prominent at L2-3.  Facet arthropathy noted. Impression T12 L2 endplate fractures unchanged from 07/29/2018 images.  Lumbar degenerative disc changes with spurring and facet arthropathy.    PMFS History: Patient Active Problem List   Diagnosis Date Noted  . Bilateral primary osteoarthritis of knee 08/04/2018  . Essential hypertension 07/08/2018  . Osteopenia 09/13/2014  . Fatty liver disease, nonalcoholic    Past Medical History:  Diagnosis Date  . Cataract   . Fatty liver disease, nonalcoholic     2007- admitted with encephalopathy unsure of cause, resolved sponatneously  . Hypertension     Family History  Problem Relation Age of Onset  . Heart disease Mother   . Cancer Father        metastatic unsure of primary  . Early death Brother        died at 644 y/o    Past Surgical History:  Procedure Laterality Date  . FRACTURE SURGERY     right shoulder, both wrists   . JOINT REPLACEMENT     HIP   Social History   Occupational History  . Not on file  Tobacco Use  . Smoking status: Never Smoker  . Smokeless tobacco: Never Used  Substance and Sexual Activity  . Alcohol use: No  . Drug use: No  . Sexual activity: Not Currently    Comment: raised tobacco, retired

## 2018-08-13 ENCOUNTER — Other Ambulatory Visit: Payer: Self-pay | Admitting: Family Medicine

## 2018-08-13 DIAGNOSIS — F5101 Primary insomnia: Secondary | ICD-10-CM

## 2018-08-13 NOTE — Telephone Encounter (Signed)
Pt is requesting refill on Xanax   LOV: 10/20/17  LRF:   07/16/18

## 2018-08-14 ENCOUNTER — Other Ambulatory Visit: Payer: Self-pay | Admitting: Family Medicine

## 2018-08-14 DIAGNOSIS — M5136 Other intervertebral disc degeneration, lumbar region: Secondary | ICD-10-CM

## 2018-08-14 NOTE — Telephone Encounter (Signed)
Ok to refill??  Last office visit/ refill 07/08/2018.

## 2018-09-01 ENCOUNTER — Ambulatory Visit: Payer: Medicare Other | Admitting: Orthopaedic Surgery

## 2018-09-11 ENCOUNTER — Other Ambulatory Visit: Payer: Self-pay | Admitting: Family Medicine

## 2018-09-11 DIAGNOSIS — F5101 Primary insomnia: Secondary | ICD-10-CM

## 2018-09-11 NOTE — Telephone Encounter (Signed)
Ok to refill??  Last office visit 07/08/2018.  Last refill 08/14/2018.

## 2018-09-15 ENCOUNTER — Ambulatory Visit (INDEPENDENT_AMBULATORY_CARE_PROVIDER_SITE_OTHER): Payer: Medicare Other | Admitting: Orthopaedic Surgery

## 2018-09-15 DIAGNOSIS — Z96643 Presence of artificial hip joint, bilateral: Secondary | ICD-10-CM | POA: Diagnosis not present

## 2018-09-15 DIAGNOSIS — M1712 Unilateral primary osteoarthritis, left knee: Secondary | ICD-10-CM | POA: Diagnosis not present

## 2018-09-15 DIAGNOSIS — S22000S Wedge compression fracture of unspecified thoracic vertebra, sequela: Secondary | ICD-10-CM

## 2018-09-16 ENCOUNTER — Other Ambulatory Visit: Payer: Self-pay | Admitting: Family Medicine

## 2018-09-16 ENCOUNTER — Encounter: Payer: Self-pay | Admitting: Orthopaedic Surgery

## 2018-09-16 DIAGNOSIS — S22000S Wedge compression fracture of unspecified thoracic vertebra, sequela: Secondary | ICD-10-CM | POA: Insufficient documentation

## 2018-09-16 DIAGNOSIS — M1712 Unilateral primary osteoarthritis, left knee: Secondary | ICD-10-CM | POA: Insufficient documentation

## 2018-09-16 DIAGNOSIS — Z96643 Presence of artificial hip joint, bilateral: Secondary | ICD-10-CM | POA: Insufficient documentation

## 2018-09-16 DIAGNOSIS — M5136 Other intervertebral disc degeneration, lumbar region: Secondary | ICD-10-CM

## 2018-09-16 NOTE — Progress Notes (Signed)
Office Visit Note   Patient: Colleen ScoreBetty L Fox           Date of Birth: 10/08/1931           MRN: [id number] Visit Date: 09/15/2018              Requested by: Colleen BrooksPickard, Warren T, MD [address],  [place] [id number] PCP: Colleen BrooksPickard, Warren T, MD   Assessment & Plan: Visit Diagnoses:  1. Unilateral primary osteoarthritis, left knee   2. Thoracic compression fracture, sequela   3. Hx of total hip arthroplasty, bilateral     Plan: Prescription given for new 4-prong folding walker since her old one is broken.  She needs to use her walker all the time to prevent further falling.  She needs total knee arthroplasty but wants to wait until COVID is less prominent and is concerned about getting therapy afterwards.  She lives alone and would require going to skilled facility after the procedure.  We discussed working on straight leg raising leg lifts to maintain her quad strength.  She can return in 3 months to discuss surgery or sooner if COVID decreases and restrictions are lifted.  X-rays reviewed with patient pathophysiology discussed total knee arthroplasty procedure postop rehab therapy, outpatient therapy, knee range of motion exercises quad strengthening was discussed in detail.    Follow-Up Instructions: Return in about 3 months (around 12/16/2018).   Orders:  No orders of the defined types were placed in this encounter.  No orders of the defined types were placed in this encounter.     Procedures: No procedures performed   Clinical Data: No additional findings.   Subjective: Chief Complaint  Patient presents with   Left Leg - Pain   Lower Back - Pain    HPI 85 year old female returns for follow-up with left knee pain and giving way.  She has an old walker that is broken no longer folds and is requesting a prescription for a new walker.  Left knee swells and she states previous knee injections on 08/04/2018 gave her good relief of her right knee but left knee  continues to bother her primarily medially.  She has had topical gel, diclofenac, intra-articular injections, use of a knee sleeve and use of a walker.  X-rays today shows bone-on-bone changes medial compartment with moderate to severe osteoarthritis left knee.  She states she falls repetitively due to the left knee locking.  Previous total hip arthroplasties done by Dr. Darrelyn HillockGioffre with constrained liner on the left due to recurrent dislocation which continues to do well.  She denies groin pain.  Previous compression fractures T11-T12.  She has some mild back discomfort but her principal problem is severe left knee pain.  Review of Systems 14 pt systems updated unchanged from 08/04/2018 office visit.  Of note is osteopenia, T11-T12 compression fractures, bilateral total hip arthroplasties.  Left greater than right knee osteoarthritis.   Objective: Vital Signs: BP (!) 158/71    Pulse 73   Physical Exam Constitutional:      Appearance: She is well-developed.  HENT:     Head: Normocephalic.     Right Ear: External ear normal.     Left Ear: External ear normal.  Eyes:     Pupils: Pupils are equal, round, and reactive to light.  Neck:     Thyroid: No thyromegaly.     Trachea: No tracheal deviation.  Cardiovascular:     Rate and Rhythm: Normal rate.  Pulmonary:  Effort: Pulmonary effort is normal.  Abdominal:     Palpations: Abdomen is soft.  Skin:    General: Skin is warm and dry.  Neurological:     Mental Status: She is alert and oriented to person, place, and time.  Psychiatric:        Behavior: Behavior normal.     Ortho Exam patient's left knee range of motion is 5 210 degrees with extreme pain along the medial joint line with crepitus.  She can ambulate with a cane but left knee gives way without her cane.  Distal pulses palpable no pain with logroll to the hips.  Left hip has a constrained liner.  Distal pulses palpable dorsalis pedis posterior tib.  Specialty Comments:  No  specialty comments available.  Imaging: CLINICAL DATA:  Left knee pain after fall last month.  EXAM: LEFT KNEE - COMPLETE 4+ VIEW  COMPARISON:  None.  FINDINGS: No evidence of fracture, dislocation, or joint effusion. Severe narrowing of medial joint space is noted. Moderate narrowing of lateral joint space is noted. Mild narrowing of patellofemoral space is noted. Soft tissues are unremarkable.  IMPRESSION: Tricompartmental degenerative joint disease as described above. No acute abnormality seen in the left knee.   Electronically Signed   By: Marijo Conception M.D.   On: 07/29/2018 13:09   PMFS History: Patient Active Problem List   Diagnosis Date Noted   Unilateral primary osteoarthritis, left knee 09/16/2018   Thoracic compression fracture, sequela 09/16/2018   Hx of total hip arthroplasty, bilateral 09/16/2018   Bilateral primary osteoarthritis of knee 08/04/2018   Essential hypertension 07/08/2018   Osteopenia 09/13/2014   Fatty liver disease, nonalcoholic    Past Medical History:  Diagnosis Date   Cataract    Fatty liver disease, nonalcoholic    9678- admitted with encephalopathy unsure of cause, resolved sponatneously   Hypertension     Family History  Problem Relation Age of Onset   Heart disease Mother    Cancer Father        metastatic unsure of primary   Early death Brother        died at 39 y/o    Past Surgical History:  Procedure Laterality Date   FRACTURE SURGERY     right shoulder, both wrists    JOINT REPLACEMENT     HIP   Social History   Occupational History   Not on file  Tobacco Use   Smoking status: Never Smoker   Smokeless tobacco: Never Used  Substance and Sexual Activity   Alcohol use: No   Drug use: No   Sexual activity: Not Currently    Comment: raised tobacco, retired

## 2018-09-16 NOTE — Telephone Encounter (Signed)
Requesting refill   Tramadol  LOV: 10/20/17   LRF:  08/14/18

## 2018-09-21 DIAGNOSIS — M1712 Unilateral primary osteoarthritis, left knee: Secondary | ICD-10-CM | POA: Diagnosis not present

## 2018-10-13 ENCOUNTER — Other Ambulatory Visit: Payer: Self-pay | Admitting: Family Medicine

## 2018-10-13 DIAGNOSIS — F5101 Primary insomnia: Secondary | ICD-10-CM

## 2018-10-13 NOTE — Telephone Encounter (Signed)
Last filled 09/11/2018 Last office visit: 07/08/2018

## 2018-10-26 ENCOUNTER — Other Ambulatory Visit: Payer: Self-pay | Admitting: Family Medicine

## 2018-10-26 DIAGNOSIS — M5136 Other intervertebral disc degeneration, lumbar region: Secondary | ICD-10-CM

## 2018-10-26 DIAGNOSIS — F5101 Primary insomnia: Secondary | ICD-10-CM

## 2018-10-26 NOTE — Telephone Encounter (Signed)
Ok to refill??  Last office visit 07/08/2018.  Last refill 09/16/2018 on tramadol.   Last refill 10/13/2018 on Xanax.Marland Kitchen

## 2018-11-14 ENCOUNTER — Other Ambulatory Visit: Payer: Self-pay | Admitting: Family Medicine

## 2018-11-16 ENCOUNTER — Other Ambulatory Visit: Payer: Self-pay | Admitting: Family Medicine

## 2018-11-16 DIAGNOSIS — F5101 Primary insomnia: Secondary | ICD-10-CM

## 2018-11-16 NOTE — Telephone Encounter (Signed)
Last refilled: 10/26/2018 Last office visit: 07/08/2018

## 2018-11-30 ENCOUNTER — Other Ambulatory Visit: Payer: Self-pay

## 2018-12-01 ENCOUNTER — Other Ambulatory Visit: Payer: Self-pay | Admitting: Family Medicine

## 2018-12-01 ENCOUNTER — Ambulatory Visit (INDEPENDENT_AMBULATORY_CARE_PROVIDER_SITE_OTHER): Payer: Medicare Other | Admitting: Family Medicine

## 2018-12-01 ENCOUNTER — Encounter: Payer: Self-pay | Admitting: Family Medicine

## 2018-12-01 VITALS — BP 123/70 | HR 76 | Temp 97.8°F | Resp 16 | Ht 63.0 in | Wt 158.0 lb

## 2018-12-01 DIAGNOSIS — M5136 Other intervertebral disc degeneration, lumbar region: Secondary | ICD-10-CM | POA: Diagnosis not present

## 2018-12-01 DIAGNOSIS — R29898 Other symptoms and signs involving the musculoskeletal system: Secondary | ICD-10-CM | POA: Diagnosis not present

## 2018-12-01 DIAGNOSIS — I10 Essential (primary) hypertension: Secondary | ICD-10-CM | POA: Diagnosis not present

## 2018-12-01 DIAGNOSIS — Z23 Encounter for immunization: Secondary | ICD-10-CM

## 2018-12-01 DIAGNOSIS — M4807 Spinal stenosis, lumbosacral region: Secondary | ICD-10-CM | POA: Diagnosis not present

## 2018-12-01 MED ORDER — PREDNISONE 20 MG PO TABS: ORAL_TABLET | ORAL | 0 refills | Status: DC

## 2018-12-01 NOTE — Progress Notes (Signed)
Subjective:    Patient ID: [name], female    DOB: 05/16/1931, 85 y.o.   MRN: [id number]  HPI 9/19 Patient presents today complaining of elevated blood pressure.  She states that over the weekend, her blood pressure was as high as 170 systolic over 90 diastolic.  It has been steadily rising over the last few weeks.  She denies any chest pain shortness of breath or dyspnea on exertion.  She became alarmed and therefore this weekend she took 1 of her old blood pressure medications, losartan HCTZ.  Her blood pressure today is well controlled however she has taken her old medication twice over the weekend.  We previously discontinued the medication due to acute kidney injury in the early summer with a rise in her creatinine to 1.73 as well as an elevated potassium of 5.9.  Even after discontinuing losartan HCTZ, her potassium remain borderline elevated at 5.2.  Patient also complains of knee pain and ankle pain.  Early this summer, she tripped over an exposed pipe trading from the wall of her home striking her right shin as she walked by.  She fell on her lateral right ankle as well as her right knee.  Ever since that time, she complains of pain and stiffness in the right knee particularly in the posterior lateral aspect of the right knee as well as swelling behind the knee in the popliteal fossa.  She also complains of pain distal and anterior to the lateral malleolus in the area of the anterior talofibular ligament.  Initially she can barely put weight on her foot however over the last few weeks, the pain has improved and subsided.  She still has pain with ambulation.  She also reports subjective swelling around the right knee.  At that time, my plan was: DC losartan HCTZ due to her history of acute kidney injury as well as hyperkalemia.  Replace with amlodipine 5 mg a day and monitor her blood pressure.  I believe the pain in her right knee is likely due to the fall, osteoarthritis, and possibly  a lateral meniscal tear although the exam is not specific today.  She cannot tolerate NSAIDs due to her mild chronic kidney disease.  Therefore I offered the patient a cortisone injection in the right knee.  She politely declined as the pain is not that severe.  I believe the pain in her right ankle is likely due to a sprain in the anterior talofibular ligament.  This is slowly improving.  I recommended the patient wear an ASO brace with ambulation to provide more ankle support and reassess in 2 to 3 weeks or sooner if worsening.  Patient is bearing weight without any difficulty and therefore I believe the fracture is unlikely.  12/01/18 Patient presents today complaining of problems in both her back and her legs.  She saw my partner in June who obtained a CT scan of the lumbar spine.  The results of the CT scan are included below:  IMPRESSION: Mild inferior endplate compression fracture of T11 and superior endplate compression fracture of T12 appear late subacute to remote. The fractures are most in keeping with senile osteoporotic/posttraumatic injuries.  Calcified right subarticular recess and foraminal protrusion at T11-12 could impact the exiting right T11 and descending right T12 roots.  Scattered foraminal narrowing appears worst at L3-4 where it is moderate to moderately severe and greater on the right.  Patient was referred to orthopedic surgery.  They have recommended bilateral knee replacements due  to osteoarthritis in the knees.  However today the patient's history seems confusing to me.  She denies any knee pain.  She does report audible crepitus in the knee however the majority of the pain is in her lower back.  She also complains of severe weakness in the legs.  Muscle strength is 5/5 equal and symmetric with knee extension and knee flexion as well as ankle dorsiflexion and ankle plantarflexion however hip flexion is extremely weak and is at best 3/5.  Hip extension is also weak  and is at best 4/5.  I am also unable to elicit any reflexes in either knee at the patella.  Patient states that as soon she stands her legs feel extremely weak.  I performed time stand up and go test.  The patient can only push herself to a standing position using her arms.  Her legs wobble and sway the entire time as if she is barely able to support her weight.  I believe that deconditioning has led to severe muscle atrophy in the proximal hip muscles.  Patient reports that she is also falling and feels unsteady on her feet.  She is having to use a walker.  However the majority of the pains in her lower back.  Past Medical History:  Diagnosis Date   Cataract    Fatty liver disease, nonalcoholic    2007- admitted with encephalopathy unsure of cause, resolved sponatneously   Hypertension    Past Surgical History:  Procedure Laterality Date   FRACTURE SURGERY     right shoulder, both wrists    JOINT REPLACEMENT     HIP   Current Outpatient Medications on File Prior to Visit  Medication Sig Dispense Refill   ALPRAZolam (XANAX) 0.5 MG tablet TAKE 1 TO 2 TABLETS BY MOUTH EVERY 8 HOURS AS NEEDED FOR ANXIETY -  MUST  LAST  30  DAYS 60 tablet 0   amLODipine (NORVASC) 5 MG tablet Take 1 tablet by mouth once daily 90 tablet 0   diclofenac (VOLTAREN) 75 MG EC tablet Take 1 tablet by mouth twice daily 180 tablet 0   escitalopram (LEXAPRO) 10 MG tablet Take 1 tablet by mouth once daily 90 tablet 3   Naproxen Sodium (ALEVE PO) Take by mouth.     traMADol (ULTRAM) 50 MG tablet TAKE 1 TABLET BY MOUTH EVERY 8 HOURS AS NEEDED FOR PAIN 60 tablet 0   Current Facility-Administered Medications on File Prior to Visit  Medication Dose Route Frequency Provider Last Rate Last Dose   denosumab (PROLIA) injection 60 mg  60 mg Subcutaneous Q6 months Otie Headlee T, MD   60 mg at 12/18/17 9794   Allergies  Allergen Reactions   Pneumococcal Vaccines Other (See Comments)    Pt had localized reaction  to injection of Pneumococcal 23 - Swelling and redness in upper arm that extend to but not beyond elbow   Social History   Socioeconomic History   Marital status: Married    Spouse name: Not on file   Number of children: Not on file   Years of education: Not on file   Highest education level: Not on file  Occupational History   Not on file  Social Needs   Financial resource strain: Not on file   Food insecurity    Worry: Not on file    Inability: Not on file   Transportation needs    Medical: Not on file    Non-medical: Not on file  Tobacco Use  Smoking status: Never Smoker   Smokeless tobacco: Never Used  Substance and Sexual Activity   Alcohol use: No   Drug use: No   Sexual activity: Not Currently    Comment: raised tobacco, retired  Lifestyle   Physical activity    Days per week: Not on file    Minutes per session: Not on file   Stress: Not on file  Relationships   Social connections    Talks on phone: Not on file    Gets together: Not on file    Attends religious service: Not on file    Active member of club or organization: Not on file    Attends meetings of clubs or organizations: Not on file    Relationship status: Not on file   Intimate partner violence    Fear of current or ex partner: Not on file    Emotionally abused: Not on file    Physically abused: Not on file    Forced sexual activity: Not on file  Other Topics Concern   Not on file  Social History Narrative   Not on file     Review of Systems  All other systems reviewed and are negative.      Objective:   Physical Exam Constitutional:      Appearance: She is well-developed.  Cardiovascular:     Rate and Rhythm: Normal rate and regular rhythm.     Heart sounds: Normal heart sounds.  Pulmonary:     Effort: Pulmonary effort is normal. No respiratory distress.     Breath sounds: Normal breath sounds. No stridor. No wheezing or rales.  Musculoskeletal:     Right  knee: She exhibits decreased range of motion. She exhibits no effusion, no LCL laxity, normal meniscus and no MCL laxity. No tenderness found. No MCL and no LCL tenderness noted.     Left knee: She exhibits decreased range of motion. She exhibits no effusion. No tenderness found.     Lumbar back: She exhibits decreased range of motion, tenderness, bony tenderness and pain.       Back:           Assessment & Plan:  Essential hypertension - Plan: CBC with Differential/Platelet, COMPLETE METABOLIC PANEL WITH GFR  DDD (degenerative disc disease), lumbar  Bilateral leg weakness  Spinal stenosis of lumbosacral region  Patient has significant leg weakness.  Some of this could be due to deconditioning however I am concerned about possible neurogenic claudication.  I have recommended physical therapy consultation at home to try to improve the strength in her proximal muscles in her legs to reduce her risk of following.  Meanwhile I will start the patient on a prednisone taper pack for what I suspect a spinal stenosis.  Recheck the patient in 1 week.  If the pain in her lower back is improving I would recommend an MRI of her back at that time to determine if she would benefit from epidural steroid injections and to determine the level where she would benefit the most.  Her exam today suggest more of spinal stenosis with resultant leg weakness.

## 2018-12-01 NOTE — Telephone Encounter (Signed)
Requesting refill    Tramadol  LOV:  12/01/18  LRF:  10/26/18

## 2018-12-02 LAB — CBC WITH DIFFERENTIAL/PLATELET
Absolute Monocytes: 451 cells/uL (ref 200–950)
Basophils Absolute: 61 cells/uL (ref 0–200)
Basophils Relative: 1.1 %
Eosinophils Absolute: 160 cells/uL (ref 15–500)
Eosinophils Relative: 2.9 %
HCT: 36.5 % (ref 35.0–45.0)
Hemoglobin: 12.2 g/dL (ref 11.7–15.5)
Lymphs Abs: 1557 cells/uL (ref 850–3900)
MCH: 31 pg (ref 27.0–33.0)
MCHC: 33.4 g/dL (ref 32.0–36.0)
MCV: 92.6 fL (ref 80.0–100.0)
MPV: 10.7 fL (ref 7.5–12.5)
Monocytes Relative: 8.2 %
Neutro Abs: 3273 cells/uL (ref 1500–7800)
Neutrophils Relative %: 59.5 %
Platelets: 227 10*3/uL (ref 140–400)
RBC: 3.94 10*6/uL (ref 3.80–5.10)
RDW: 13.1 % (ref 11.0–15.0)
Total Lymphocyte: 28.3 %
WBC: 5.5 10*3/uL (ref 3.8–10.8)

## 2018-12-02 LAB — COMPLETE METABOLIC PANEL WITH GFR
AG Ratio: 1.9 (calc) (ref 1.0–2.5)
ALT: 14 U/L (ref 6–29)
AST: 18 U/L (ref 10–35)
Albumin: 4.1 g/dL (ref 3.6–5.1)
Alkaline phosphatase (APISO): 53 U/L (ref 37–153)
BUN/Creatinine Ratio: 18 (calc) (ref 6–22)
BUN: 23 mg/dL (ref 7–25)
CO2: 24 mmol/L (ref 20–32)
Calcium: 9.5 mg/dL (ref 8.6–10.4)
Chloride: 106 mmol/L (ref 98–110)
Creat: 1.27 mg/dL — ABNORMAL HIGH (ref 0.60–0.88)
GFR, Est African American: 45 mL/min/{1.73_m2} — ABNORMAL LOW (ref >=60)
GFR, Est Non African American: 39 mL/min/{1.73_m2} — ABNORMAL LOW (ref >=60)
Globulin: 2.2 g/dL (calc) (ref 1.9–3.7)
Glucose, Bld: 84 mg/dL (ref 65–99)
Potassium: 5 mmol/L (ref 3.5–5.3)
Sodium: 140 mmol/L (ref 135–146)
Total Bilirubin: 0.5 mg/dL (ref 0.2–1.2)
Total Protein: 6.3 g/dL (ref 6.1–8.1)

## 2018-12-08 ENCOUNTER — Ambulatory Visit (INDEPENDENT_AMBULATORY_CARE_PROVIDER_SITE_OTHER): Payer: Medicare Other | Admitting: Family Medicine

## 2018-12-08 ENCOUNTER — Other Ambulatory Visit: Payer: Self-pay

## 2018-12-08 ENCOUNTER — Encounter: Payer: Self-pay | Admitting: Family Medicine

## 2018-12-08 VITALS — BP 130/62 | HR 68 | Temp 97.2°F | Resp 18 | Ht 63.0 in | Wt 153.0 lb

## 2018-12-08 DIAGNOSIS — R29898 Other symptoms and signs involving the musculoskeletal system: Secondary | ICD-10-CM | POA: Diagnosis not present

## 2018-12-08 DIAGNOSIS — M4807 Spinal stenosis, lumbosacral region: Secondary | ICD-10-CM

## 2018-12-08 DIAGNOSIS — M5136 Other intervertebral disc degeneration, lumbar region: Secondary | ICD-10-CM

## 2018-12-08 DIAGNOSIS — N183 Chronic kidney disease, stage 3 unspecified: Secondary | ICD-10-CM | POA: Insufficient documentation

## 2018-12-08 NOTE — Progress Notes (Signed)
Subjective:    Patient ID: [name], female    DOB: 11/18/1931, 85 y.o.   MRN: [id number]  HPI 9/19 Patient presents today complaining of elevated blood pressure.  She states that over the weekend, her blood pressure was as high as 170 systolic over 90 diastolic.  It has been steadily rising over the last few weeks.  She denies any chest pain shortness of breath or dyspnea on exertion.  She became alarmed and therefore this weekend she took 1 of her old blood pressure medications, losartan HCTZ.  Her blood pressure today is well controlled however she has taken her old medication twice over the weekend.  We previously discontinued the medication due to acute kidney injury in the early summer with a rise in her creatinine to 1.73 as well as an elevated potassium of 5.9.  Even after discontinuing losartan HCTZ, her potassium remain borderline elevated at 5.2.  Patient also complains of knee pain and ankle pain.  Early this summer, she tripped over an exposed pipe trading from the wall of her home striking her right shin as she walked by.  She fell on her lateral right ankle as well as her right knee.  Ever since that time, she complains of pain and stiffness in the right knee particularly in the posterior lateral aspect of the right knee as well as swelling behind the knee in the popliteal fossa.  She also complains of pain distal and anterior to the lateral malleolus in the area of the anterior talofibular ligament.  Initially she can barely put weight on her foot however over the last few weeks, the pain has improved and subsided.  She still has pain with ambulation.  She also reports subjective swelling around the right knee.  At that time, my plan was: DC losartan HCTZ due to her history of acute kidney injury as well as hyperkalemia.  Replace with amlodipine 5 mg a day and monitor her blood pressure.  I believe the pain in her right knee is likely due to the fall, osteoarthritis, and possibly  a lateral meniscal tear although the exam is not specific today.  She cannot tolerate NSAIDs due to her mild chronic kidney disease.  Therefore I offered the patient a cortisone injection in the right knee.  She politely declined as the pain is not that severe.  I believe the pain in her right ankle is likely due to a sprain in the anterior talofibular ligament.  This is slowly improving.  I recommended the patient wear an ASO brace with ambulation to provide more ankle support and reassess in 2 to 3 weeks or sooner if worsening.  Patient is bearing weight without any difficulty and therefore I believe the fracture is unlikely.  12/01/18 Patient presents today complaining of problems in both her back and her legs.  She saw my partner in June who obtained a CT scan of the lumbar spine.  The results of the CT scan are included below:  IMPRESSION: Mild inferior endplate compression fracture of T11 and superior endplate compression fracture of T12 appear late subacute to remote. The fractures are most in keeping with senile osteoporotic/posttraumatic injuries.  Calcified right subarticular recess and foraminal protrusion at T11-12 could impact the exiting right T11 and descending right T12 roots.  Scattered foraminal narrowing appears worst at L3-4 where it is moderate to moderately severe and greater on the right.  Patient was referred to orthopedic surgery.  They have recommended bilateral knee replacements due  to osteoarthritis in the knees.  However today the patient's history seems confusing to me.  She denies any knee pain.  She does report audible crepitus in the knee however the majority of the pain is in her lower back.  She also complains of severe weakness in the legs.  Muscle strength is 5/5 equal and symmetric with knee extension and knee flexion as well as ankle dorsiflexion and ankle plantarflexion however hip flexion is extremely weak and is at best 3/5.  Hip extension is also weak  and is at best 4/5.  I am also unable to elicit any reflexes in either knee at the patella.  Patient states that as soon she stands her legs feel extremely weak.  I performed time stand up and go test.  The patient can only push herself to a standing position using her arms.  Her legs wobble and sway the entire time as if she is barely able to support her weight.  I believe that deconditioning has led to severe muscle atrophy in the proximal hip muscles.  Patient reports that she is also falling and feels unsteady on her feet.  She is having to use a walker.  However the majority of the pains in her lower back.  At that time, my plan was: Patient has significant leg weakness.  Some of this could be due to deconditioning however I am concerned about possible neurogenic claudication.  I have recommended physical therapy consultation at home to try to improve the strength in her proximal muscles in her legs to reduce her risk of following.  Meanwhile I will start the patient on a prednisone taper pack for what I suspect a spinal stenosis.  Recheck the patient in 1 week.  If the pain in her lower back is improving I would recommend an MRI of her back at that time to determine if she would benefit from epidural steroid injections and to determine the level where she would benefit the most.  Her exam today suggest more of spinal stenosis with resultant leg weakness.  12/08/18  Patient presents today for follow-up.  She saw some improvement on the prednisone perhaps 25% improvement.  She reports good days and bad days however today is a bad day.  She continues to report severe pain in the center of her back.  She also continues to demonstrate proximal leg muscle weakness.  She has barely able to stand from a seated position and this is only with holding onto a walker.  She is now having to use a walker simply to walk.  She does not feel deconditioning is causing this because she states that she is active every day  cleaning her house and using a leaf blower however she progressively is getting weaker and weaker and is requiring a walker now to walk.  She also reports some numbness and burning in both feet.  Both legs frequently give out on her and she has fallen several times.  I am concerned about neurogenic claudication.  She also continues to have severe pain in both knees secondary to osteoarthritis of the knees however my biggest concern is her progressive muscle weakness in both legs.  Past Medical History:  Diagnosis Date   Cataract    Fatty liver disease, nonalcoholic    2007- admitted with encephalopathy unsure of cause, resolved sponatneously   Hypertension    Past Surgical History:  Procedure Laterality Date   FRACTURE SURGERY     right shoulder, both wrists  JOINT REPLACEMENT     HIP   Current Outpatient Medications on File Prior to Visit  Medication Sig Dispense Refill   ALPRAZolam (XANAX) 0.5 MG tablet TAKE 1 TO 2 TABLETS BY MOUTH EVERY 8 HOURS AS NEEDED FOR ANXIETY -  MUST  LAST  30  DAYS 60 tablet 0   amLODipine (NORVASC) 5 MG tablet Take 1 tablet by mouth once daily 90 tablet 0   escitalopram (LEXAPRO) 10 MG tablet Take 1 tablet by mouth once daily 90 tablet 3   traMADol (ULTRAM) 50 MG tablet TAKE 1 TABLET BY MOUTH EVERY 8 HOURS AS NEEDED FOR PAIN (Patient not taking: Reported on 12/08/2018) 60 tablet 0   Current Facility-Administered Medications on File Prior to Visit  Medication Dose Route Frequency Provider Last Rate Last Dose   denosumab (PROLIA) injection 60 mg  60 mg Subcutaneous Q6 months Virgie Chery T, MD   60 mg at 12/18/17 [id number]   Allergies  Allergen Reactions   Pneumococcal Vaccines Other (See Comments)    Pt had localized reaction to injection of Pneumococcal 23 - Swelling and redness in upper arm that extend to but not beyond elbow   Social History   Socioeconomic History   Marital status: Married    Spouse name: Not on file   Number of  children: Not on file   Years of education: Not on file   Highest education level: Not on file  Occupational History   Not on file  Social Needs   Financial resource strain: Not on file   Food insecurity    Worry: Not on file    Inability: Not on file   Transportation needs    Medical: Not on file    Non-medical: Not on file  Tobacco Use   Smoking status: Never Smoker   Smokeless tobacco: Never Used  Substance and Sexual Activity   Alcohol use: No   Drug use: No   Sexual activity: Not Currently    Comment: raised tobacco, retired  Lifestyle   Physical activity    Days per week: Not on file    Minutes per session: Not on file   Stress: Not on file  Relationships   Social connections    Talks on phone: Not on file    Gets together: Not on file    Attends religious service: Not on file    Active member of club or organization: Not on file    Attends meetings of clubs or organizations: Not on file    Relationship status: Not on file   Intimate partner violence    Fear of current or ex partner: Not on file    Emotionally abused: Not on file    Physically abused: Not on file    Forced sexual activity: Not on file  Other Topics Concern   Not on file  Social History Narrative   Not on file     Review of Systems  All other systems reviewed and are negative.      Objective:   Physical Exam Constitutional:      Appearance: She is well-developed.  Cardiovascular:     Rate and Rhythm: Normal rate and regular rhythm.     Heart sounds: Normal heart sounds.  Pulmonary:     Effort: Pulmonary effort is normal. No respiratory distress.     Breath sounds: Normal breath sounds. No stridor. No wheezing or rales.  Musculoskeletal:     Right knee: She exhibits decreased range of motion. She  exhibits no effusion, no LCL laxity, normal meniscus and no MCL laxity. No tenderness found. No MCL and no LCL tenderness noted.     Left knee: She exhibits decreased range  of motion. She exhibits no effusion. No tenderness found.     Lumbar back: She exhibits decreased range of motion, tenderness, bony tenderness and pain.       Back:           Assessment & Plan:  DDD (degenerative disc disease), lumbar - Plan: MR Lumbar Spine Wo Contrast  Bilateral leg weakness - Plan: MR Lumbar Spine Wo Contrast  Spinal stenosis of lumbosacral region - Plan: MR Lumbar Spine Wo Contrast  Leg weakness seems to be progressively worsening.  I am concerned about neurogenic claudication.  She has yet to hear from physical therapy however she is falling more frequently and is now having to use a walker simply to get out of a chair.  Therefore I will proceed with an MRI of the lumbar spine to evaluate further.  If there is significant lumbar spinal stenosis, I would recommend a referral to neurosurgery to discuss treatment options or perhaps epidural steroid injections particular to improve her leg weakness.  Patient was also recommended not to take any further NSAIDs as her lab work shows chronic kidney disease and the NSAIDs are likely exacerbating this.  She can use tramadol.  Await the results of the MRI

## 2018-12-14 ENCOUNTER — Ambulatory Visit
Admission: RE | Admit: 2018-12-14 | Discharge: 2018-12-14 | Disposition: A | Discharge status: Home / Self Care | Payer: Medicare Other | Source: Ambulatory Visit | Attending: Family Medicine | Admitting: Family Medicine

## 2018-12-14 ENCOUNTER — Other Ambulatory Visit: Payer: Self-pay

## 2018-12-14 ENCOUNTER — Other Ambulatory Visit: Payer: Self-pay | Admitting: Family Medicine

## 2018-12-14 DIAGNOSIS — M4807 Spinal stenosis, lumbosacral region: Secondary | ICD-10-CM

## 2018-12-14 DIAGNOSIS — R29898 Other symptoms and signs involving the musculoskeletal system: Secondary | ICD-10-CM

## 2018-12-14 DIAGNOSIS — M48061 Spinal stenosis, lumbar region without neurogenic claudication: Secondary | ICD-10-CM | POA: Diagnosis not present

## 2018-12-14 DIAGNOSIS — M5136 Other intervertebral disc degeneration, lumbar region: Secondary | ICD-10-CM

## 2018-12-14 DIAGNOSIS — F5101 Primary insomnia: Secondary | ICD-10-CM

## 2018-12-14 NOTE — Telephone Encounter (Signed)
Last office visit: 12/08/2018 Last refilled: 11/16/2018

## 2018-12-16 ENCOUNTER — Ambulatory Visit: Payer: Medicare Other | Admitting: Orthopaedic Surgery

## 2018-12-16 ENCOUNTER — Other Ambulatory Visit: Payer: Self-pay | Admitting: Family Medicine

## 2018-12-16 DIAGNOSIS — M5136 Other intervertebral disc degeneration, lumbar region: Secondary | ICD-10-CM

## 2018-12-16 DIAGNOSIS — M4807 Spinal stenosis, lumbosacral region: Secondary | ICD-10-CM

## 2018-12-16 DIAGNOSIS — R29898 Other symptoms and signs involving the musculoskeletal system: Secondary | ICD-10-CM

## 2018-12-17 ENCOUNTER — Encounter: Payer: Self-pay | Admitting: Family Medicine

## 2018-12-17 ENCOUNTER — Other Ambulatory Visit: Payer: Self-pay

## 2018-12-17 ENCOUNTER — Ambulatory Visit (INDEPENDENT_AMBULATORY_CARE_PROVIDER_SITE_OTHER): Payer: Medicare Other | Admitting: Family Medicine

## 2018-12-17 VITALS — BP 138/72 | HR 86 | Temp 98.2°F | Resp 14 | Ht 63.0 in | Wt 154.5 lb

## 2018-12-17 DIAGNOSIS — M4807 Spinal stenosis, lumbosacral region: Secondary | ICD-10-CM

## 2018-12-17 NOTE — Progress Notes (Signed)
Subjective:    Patient ID: [name], female    DOB: 31-Aug-1931, 85 y.o.   MRN: [id number]  HPI 9/19 Patient presents today complaining of elevated blood pressure.  She states that over the weekend, her blood pressure was as high as 170 systolic over 90 diastolic.  It has been steadily rising over the last few weeks.  She denies any chest pain shortness of breath or dyspnea on exertion.  She became alarmed and therefore this weekend she took 1 of her old blood pressure medications, losartan HCTZ.  Her blood pressure today is well controlled however she has taken her old medication twice over the weekend.  We previously discontinued the medication due to acute kidney injury in the early summer with a rise in her creatinine to 1.73 as well as an elevated potassium of 5.9.  Even after discontinuing losartan HCTZ, her potassium remain borderline elevated at 5.2.  Patient also complains of knee pain and ankle pain.  Early this summer, she tripped over an exposed pipe trading from the wall of her home striking her right shin as she walked by.  She fell on her lateral right ankle as well as her right knee.  Ever since that time, she complains of pain and stiffness in the right knee particularly in the posterior lateral aspect of the right knee as well as swelling behind the knee in the popliteal fossa.  She also complains of pain distal and anterior to the lateral malleolus in the area of the anterior talofibular ligament.  Initially she can barely put weight on her foot however over the last few weeks, the pain has improved and subsided.  She still has pain with ambulation.  She also reports subjective swelling around the right knee.  At that time, my plan was: DC losartan HCTZ due to her history of acute kidney injury as well as hyperkalemia.  Replace with amlodipine 5 mg a day and monitor her blood pressure.  I believe the pain in her right knee is likely due to the fall, osteoarthritis, and possibly  a lateral meniscal tear although the exam is not specific today.  She cannot tolerate NSAIDs due to her mild chronic kidney disease.  Therefore I offered the patient a cortisone injection in the right knee.  She politely declined as the pain is not that severe.  I believe the pain in her right ankle is likely due to a sprain in the anterior talofibular ligament.  This is slowly improving.  I recommended the patient wear an ASO brace with ambulation to provide more ankle support and reassess in 2 to 3 weeks or sooner if worsening.  Patient is bearing weight without any difficulty and therefore I believe the fracture is unlikely.  12/01/18 Patient presents today complaining of problems in both her back and her legs.  She saw my partner in June who obtained a CT scan of the lumbar spine.  The results of the CT scan are included below:  IMPRESSION: Mild inferior endplate compression fracture of T11 and superior endplate compression fracture of T12 appear late subacute to remote. The fractures are most in keeping with senile osteoporotic/posttraumatic injuries.  Calcified right subarticular recess and foraminal protrusion at T11-12 could impact the exiting right T11 and descending right T12 roots.  Scattered foraminal narrowing appears worst at L3-4 where it is moderate to moderately severe and greater on the right.  Patient was referred to orthopedic surgery.  They have recommended bilateral knee replacements due  to osteoarthritis in the knees.  However today the patient's history seems confusing to me.  She denies any knee pain.  She does report audible crepitus in the knee however the majority of the pain is in her lower back.  She also complains of severe weakness in the legs.  Muscle strength is 5/5 equal and symmetric with knee extension and knee flexion as well as ankle dorsiflexion and ankle plantarflexion however hip flexion is extremely weak and is at best 3/5.  Hip extension is also weak  and is at best 4/5.  I am also unable to elicit any reflexes in either knee at the patella.  Patient states that as soon she stands her legs feel extremely weak.  I performed time stand up and go test.  The patient can only push herself to a standing position using her arms.  Her legs wobble and sway the entire time as if she is barely able to support her weight.  I believe that deconditioning has led to severe muscle atrophy in the proximal hip muscles.  Patient reports that she is also falling and feels unsteady on her feet.  She is having to use a walker.  However the majority of the pains in her lower back.  At that time, my plan was: Patient has significant leg weakness.  Some of this could be due to deconditioning however I am concerned about possible neurogenic claudication.  I have recommended physical therapy consultation at home to try to improve the strength in her proximal muscles in her legs to reduce her risk of following.  Meanwhile I will start the patient on a prednisone taper pack for what I suspect a spinal stenosis.  Recheck the patient in 1 week.  If the pain in her lower back is improving I would recommend an MRI of her back at that time to determine if she would benefit from epidural steroid injections and to determine the level where she would benefit the most.  Her exam today suggest more of spinal stenosis with resultant leg weakness.  12/08/18  Patient presents today for follow-up.  She saw some improvement on the prednisone perhaps 25% improvement.  She reports good days and bad days however today is a bad day.  She continues to report severe pain in the center of her back.  She also continues to demonstrate proximal leg muscle weakness.  She has barely able to stand from a seated position and this is only with holding onto a walker.  She is now having to use a walker simply to walk.  She does not feel deconditioning is causing this because she states that she is active every day  cleaning her house and using a leaf blower however she progressively is getting weaker and weaker and is requiring a walker now to walk.  She also reports some numbness and burning in both feet.  Both legs frequently give out on her and she has fallen several times.  I am concerned about neurogenic claudication.  She also continues to have severe pain in both knees secondary to osteoarthritis of the knees however my biggest concern is her progressive muscle weakness in both legs.  At that time, my plan was: Leg weakness seems to be progressively worsening.  I am concerned about neurogenic claudication.  She has yet to hear from physical therapy however she is falling more frequently and is now having to use a walker simply to get out of a chair.  Therefore I will  proceed with an MRI of the lumbar spine to evaluate further.  If there is significant lumbar spinal stenosis, I would recommend a referral to neurosurgery to discuss treatment options or perhaps epidural steroid injections particular to improve her leg weakness.  Patient was also recommended not to take any further NSAIDs as her lab work shows chronic kidney disease and the NSAIDs are likely exacerbating this.  She can use tramadol.  Await the results of the MRI  12/17/18 MRI revealed:  L3-4: 5 mm retrolisthesis with disc and facet degeneration. Diffuse endplate spurring. Severe subarticular and foraminal stenosis on the right. Moderate subarticular and foraminal stenosis on the left. Spinal canal adequate in size  L4-5: Disc degeneration and spurring. Moderate subarticular stenosis on the right due to spurring  L5-S1: Disc degeneration with endplate spurring. Moderate subarticular and foraminal stenosis bilaterally due to spurring.  Patient is here today with her son to discuss the findings.  Her biggest concern is her inability to walk.  She states that she has a difficult time standing from a seated position due to the weakness in her  legs.  When she does stand, her legs feel extremely wobbly and feel like they are going to give way on her.  After standing for a short period of time she has to sit down because of the weakness in her legs particularly the proximal muscles in the quadriceps and hamstrings bilaterally.  She also has significant pain in both knees however her leg weakness and low back pain seem to be her bigger issues. Past Medical History:  Diagnosis Date   Cataract    CKD (chronic kidney disease) stage 3, GFR 30-59 ml/min    Fatty liver disease, nonalcoholic    2007- admitted with encephalopathy unsure of cause, resolved sponatneously   Hypertension    Past Surgical History:  Procedure Laterality Date   FRACTURE SURGERY     right shoulder, both wrists    JOINT REPLACEMENT     HIP   Current Outpatient Medications on File Prior to Visit  Medication Sig Dispense Refill   ALPRAZolam (XANAX) 0.5 MG tablet TAKE 1 TO 2 TABLETS BY MOUTH EVERY 8 HOURS AS NEEDED FOR ANXIETY (MUST  LAST  30  DAYS) 60 tablet 0   amLODipine (NORVASC) 5 MG tablet Take 1 tablet by mouth once daily 90 tablet 0   escitalopram (LEXAPRO) 10 MG tablet Take 1 tablet by mouth once daily 90 tablet 3   traMADol (ULTRAM) 50 MG tablet TAKE 1 TABLET BY MOUTH EVERY 8 HOURS AS NEEDED FOR PAIN 60 tablet 0   Current Facility-Administered Medications on File Prior to Visit  Medication Dose Route Frequency Provider Last Rate Last Dose   denosumab (PROLIA) injection 60 mg  60 mg Subcutaneous Q6 months Reigan Tolliver T, MD   60 mg at 12/18/17 [id number]   Allergies  Allergen Reactions   Pneumococcal Vaccines Other (See Comments)    Pt had localized reaction to injection of Pneumococcal 23 - Swelling and redness in upper arm that extend to but not beyond elbow   Social History   Socioeconomic History   Marital status: Married    Spouse name: Not on file   Number of children: Not on file   Years of education: Not on file   Highest  education level: Not on file  Occupational History   Not on file  Social Needs   Financial resource strain: Not on file   Food insecurity    Worry: Not  on file    Inability: Not on file   Transportation needs    Medical: Not on file    Non-medical: Not on file  Tobacco Use   Smoking status: Never Smoker   Smokeless tobacco: Never Used  Substance and Sexual Activity   Alcohol use: No   Drug use: No   Sexual activity: Not Currently    Comment: raised tobacco, retired  Lifestyle   Physical activity    Days per week: Not on file    Minutes per session: Not on file   Stress: Not on file  Relationships   Social connections    Talks on phone: Not on file    Gets together: Not on file    Attends religious service: Not on file    Active member of club or organization: Not on file    Attends meetings of clubs or organizations: Not on file    Relationship status: Not on file   Intimate partner violence    Fear of current or ex partner: Not on file    Emotionally abused: Not on file    Physically abused: Not on file    Forced sexual activity: Not on file  Other Topics Concern   Not on file  Social History Narrative   Not on file     Review of Systems  All other systems reviewed and are negative.      Objective:   Physical Exam Constitutional:      Appearance: She is well-developed.  Cardiovascular:     Rate and Rhythm: Normal rate and regular rhythm.     Heart sounds: Normal heart sounds.  Pulmonary:     Effort: Pulmonary effort is normal. No respiratory distress.     Breath sounds: Normal breath sounds. No stridor. No wheezing or rales.  Musculoskeletal:     Right knee: She exhibits decreased range of motion. She exhibits no effusion, no LCL laxity, normal meniscus and no MCL laxity. No tenderness found. No MCL and no LCL tenderness noted.     Left knee: She exhibits decreased range of motion. She exhibits no effusion. No tenderness found.     Lumbar  back: She exhibits decreased range of motion, tenderness, bony tenderness and pain.       Back:           Assessment & Plan:  Spinal stenosis of lumbosacral region - Plan: Ambulatory referral to Orthopedic Surgery  Patient has severe subarticular stenosis at L3-L4 and moderate subarticular and foraminal stenosis at multiple other levels.  I question if she would benefit from possible epidural steroid injections in the back.  There may be an element of nerve compression due to the foraminal stenosis that could be causing lumbosacral radiculopathy and leg weakness.  If this can help improve her low back pain and improve her leg weakness, the patient would certainly be willing to consider it.  Therefore I will consult Dr. Nelva Bush at [facility] to evaluate for this.

## 2018-12-23 ENCOUNTER — Telehealth: Payer: Self-pay | Admitting: Family Medicine

## 2018-12-23 NOTE — Telephone Encounter (Signed)
Patient called in requesting a stronger pain medication to be called in for her back pain. She is not scheduled to see Dr. Nelva Bush until December 3rd and would like something called in to get her to that appointment. Please advise?

## 2018-12-24 ENCOUNTER — Other Ambulatory Visit: Payer: Self-pay | Admitting: Family Medicine

## 2018-12-24 DIAGNOSIS — M5136 Other intervertebral disc degeneration, lumbar region: Secondary | ICD-10-CM

## 2018-12-24 MED ORDER — HYDROCODONE-ACETAMINOPHEN 5-325 MG PO TABS: 1.0000 | ORAL_TABLET | Freq: Four times a day (QID) | ORAL | 0 refills | Status: DC | PRN

## 2018-12-24 NOTE — Telephone Encounter (Signed)
Last refilled: 12/03/2018 Last office visit: 12/17/2018

## 2018-12-24 NOTE — Telephone Encounter (Signed)
Spoke with patient and informed her that Dr. Dennard Schaumann sent in [facility]. Patient verbalized understanding.

## 2018-12-24 NOTE — Telephone Encounter (Signed)
Left message return call

## 2018-12-24 NOTE — Telephone Encounter (Signed)
Patient can use Norco 5/325 1 every 6 hours as needed for pain.  I will send in 30 tablets

## 2018-12-28 DIAGNOSIS — M5136 Other intervertebral disc degeneration, lumbar region: Secondary | ICD-10-CM | POA: Diagnosis not present

## 2018-12-30 DIAGNOSIS — M5136 Other intervertebral disc degeneration, lumbar region: Secondary | ICD-10-CM | POA: Diagnosis not present

## 2019-01-04 ENCOUNTER — Other Ambulatory Visit: Payer: Self-pay | Admitting: Family Medicine

## 2019-01-04 DIAGNOSIS — M5136 Other intervertebral disc degeneration, lumbar region: Secondary | ICD-10-CM

## 2019-01-06 DIAGNOSIS — G8311 Monoplegia of lower limb affecting right dominant side: Secondary | ICD-10-CM | POA: Diagnosis not present

## 2019-01-06 DIAGNOSIS — I129 Hypertensive chronic kidney disease with stage 1 through stage 4 chronic kidney disease, or unspecified chronic kidney disease: Secondary | ICD-10-CM | POA: Diagnosis not present

## 2019-01-06 DIAGNOSIS — M5136 Other intervertebral disc degeneration, lumbar region: Secondary | ICD-10-CM | POA: Diagnosis not present

## 2019-01-06 DIAGNOSIS — Z7952 Long term (current) use of systemic steroids: Secondary | ICD-10-CM | POA: Diagnosis not present

## 2019-01-06 DIAGNOSIS — K760 Fatty (change of) liver, not elsewhere classified: Secondary | ICD-10-CM | POA: Diagnosis not present

## 2019-01-06 DIAGNOSIS — H269 Unspecified cataract: Secondary | ICD-10-CM | POA: Diagnosis not present

## 2019-01-06 DIAGNOSIS — M4807 Spinal stenosis, lumbosacral region: Secondary | ICD-10-CM | POA: Diagnosis not present

## 2019-01-06 DIAGNOSIS — N189 Chronic kidney disease, unspecified: Secondary | ICD-10-CM | POA: Diagnosis not present

## 2019-01-07 ENCOUNTER — Other Ambulatory Visit: Payer: Self-pay

## 2019-01-07 ENCOUNTER — Ambulatory Visit (INDEPENDENT_AMBULATORY_CARE_PROVIDER_SITE_OTHER): Payer: Medicare Other

## 2019-01-07 DIAGNOSIS — M8580 Other specified disorders of bone density and structure, unspecified site: Secondary | ICD-10-CM | POA: Diagnosis not present

## 2019-01-07 DIAGNOSIS — M170 Bilateral primary osteoarthritis of knee: Secondary | ICD-10-CM

## 2019-01-13 DIAGNOSIS — M4807 Spinal stenosis, lumbosacral region: Secondary | ICD-10-CM | POA: Diagnosis not present

## 2019-01-13 DIAGNOSIS — Z7952 Long term (current) use of systemic steroids: Secondary | ICD-10-CM | POA: Diagnosis not present

## 2019-01-13 DIAGNOSIS — N189 Chronic kidney disease, unspecified: Secondary | ICD-10-CM | POA: Diagnosis not present

## 2019-01-13 DIAGNOSIS — I129 Hypertensive chronic kidney disease with stage 1 through stage 4 chronic kidney disease, or unspecified chronic kidney disease: Secondary | ICD-10-CM | POA: Diagnosis not present

## 2019-01-13 DIAGNOSIS — H269 Unspecified cataract: Secondary | ICD-10-CM | POA: Diagnosis not present

## 2019-01-13 DIAGNOSIS — M5136 Other intervertebral disc degeneration, lumbar region: Secondary | ICD-10-CM | POA: Diagnosis not present

## 2019-01-13 DIAGNOSIS — K760 Fatty (change of) liver, not elsewhere classified: Secondary | ICD-10-CM | POA: Diagnosis not present

## 2019-01-14 ENCOUNTER — Other Ambulatory Visit: Payer: Self-pay | Admitting: Family Medicine

## 2019-01-14 DIAGNOSIS — F5101 Primary insomnia: Secondary | ICD-10-CM

## 2019-01-14 NOTE — Telephone Encounter (Signed)
Ok to refill??  Last office visit 12/17/2018.  Last refill 12/14/2018.

## 2019-01-15 DIAGNOSIS — I129 Hypertensive chronic kidney disease with stage 1 through stage 4 chronic kidney disease, or unspecified chronic kidney disease: Secondary | ICD-10-CM | POA: Diagnosis not present

## 2019-01-15 DIAGNOSIS — N189 Chronic kidney disease, unspecified: Secondary | ICD-10-CM | POA: Diagnosis not present

## 2019-01-15 DIAGNOSIS — M5136 Other intervertebral disc degeneration, lumbar region: Secondary | ICD-10-CM | POA: Diagnosis not present

## 2019-01-15 DIAGNOSIS — H269 Unspecified cataract: Secondary | ICD-10-CM | POA: Diagnosis not present

## 2019-01-15 DIAGNOSIS — Z7952 Long term (current) use of systemic steroids: Secondary | ICD-10-CM | POA: Diagnosis not present

## 2019-01-15 DIAGNOSIS — M4807 Spinal stenosis, lumbosacral region: Secondary | ICD-10-CM | POA: Diagnosis not present

## 2019-01-15 DIAGNOSIS — K760 Fatty (change of) liver, not elsewhere classified: Secondary | ICD-10-CM | POA: Diagnosis not present

## 2019-01-18 DIAGNOSIS — N189 Chronic kidney disease, unspecified: Secondary | ICD-10-CM | POA: Diagnosis not present

## 2019-01-18 DIAGNOSIS — M4807 Spinal stenosis, lumbosacral region: Secondary | ICD-10-CM | POA: Diagnosis not present

## 2019-01-18 DIAGNOSIS — M5136 Other intervertebral disc degeneration, lumbar region: Secondary | ICD-10-CM | POA: Diagnosis not present

## 2019-01-18 DIAGNOSIS — I129 Hypertensive chronic kidney disease with stage 1 through stage 4 chronic kidney disease, or unspecified chronic kidney disease: Secondary | ICD-10-CM | POA: Diagnosis not present

## 2019-01-18 DIAGNOSIS — H269 Unspecified cataract: Secondary | ICD-10-CM | POA: Diagnosis not present

## 2019-01-18 DIAGNOSIS — Z7952 Long term (current) use of systemic steroids: Secondary | ICD-10-CM | POA: Diagnosis not present

## 2019-01-18 DIAGNOSIS — K760 Fatty (change of) liver, not elsewhere classified: Secondary | ICD-10-CM | POA: Diagnosis not present

## 2019-01-20 DIAGNOSIS — H269 Unspecified cataract: Secondary | ICD-10-CM | POA: Diagnosis not present

## 2019-01-20 DIAGNOSIS — M4807 Spinal stenosis, lumbosacral region: Secondary | ICD-10-CM | POA: Diagnosis not present

## 2019-01-20 DIAGNOSIS — M5136 Other intervertebral disc degeneration, lumbar region: Secondary | ICD-10-CM | POA: Diagnosis not present

## 2019-01-20 DIAGNOSIS — K760 Fatty (change of) liver, not elsewhere classified: Secondary | ICD-10-CM | POA: Diagnosis not present

## 2019-01-20 DIAGNOSIS — I129 Hypertensive chronic kidney disease with stage 1 through stage 4 chronic kidney disease, or unspecified chronic kidney disease: Secondary | ICD-10-CM | POA: Diagnosis not present

## 2019-01-20 DIAGNOSIS — Z7952 Long term (current) use of systemic steroids: Secondary | ICD-10-CM | POA: Diagnosis not present

## 2019-01-20 DIAGNOSIS — N189 Chronic kidney disease, unspecified: Secondary | ICD-10-CM | POA: Diagnosis not present

## 2019-01-26 DIAGNOSIS — M4807 Spinal stenosis, lumbosacral region: Secondary | ICD-10-CM | POA: Diagnosis not present

## 2019-01-26 DIAGNOSIS — I129 Hypertensive chronic kidney disease with stage 1 through stage 4 chronic kidney disease, or unspecified chronic kidney disease: Secondary | ICD-10-CM | POA: Diagnosis not present

## 2019-01-26 DIAGNOSIS — M5136 Other intervertebral disc degeneration, lumbar region: Secondary | ICD-10-CM | POA: Diagnosis not present

## 2019-01-26 DIAGNOSIS — Z7952 Long term (current) use of systemic steroids: Secondary | ICD-10-CM | POA: Diagnosis not present

## 2019-01-26 DIAGNOSIS — N189 Chronic kidney disease, unspecified: Secondary | ICD-10-CM | POA: Diagnosis not present

## 2019-01-26 DIAGNOSIS — H269 Unspecified cataract: Secondary | ICD-10-CM | POA: Diagnosis not present

## 2019-01-26 DIAGNOSIS — K760 Fatty (change of) liver, not elsewhere classified: Secondary | ICD-10-CM | POA: Diagnosis not present

## 2019-01-28 DIAGNOSIS — M5136 Other intervertebral disc degeneration, lumbar region: Secondary | ICD-10-CM | POA: Diagnosis not present

## 2019-02-03 DIAGNOSIS — N189 Chronic kidney disease, unspecified: Secondary | ICD-10-CM | POA: Diagnosis not present

## 2019-02-03 DIAGNOSIS — K760 Fatty (change of) liver, not elsewhere classified: Secondary | ICD-10-CM | POA: Diagnosis not present

## 2019-02-03 DIAGNOSIS — H269 Unspecified cataract: Secondary | ICD-10-CM | POA: Diagnosis not present

## 2019-02-03 DIAGNOSIS — I129 Hypertensive chronic kidney disease with stage 1 through stage 4 chronic kidney disease, or unspecified chronic kidney disease: Secondary | ICD-10-CM | POA: Diagnosis not present

## 2019-02-03 DIAGNOSIS — M5136 Other intervertebral disc degeneration, lumbar region: Secondary | ICD-10-CM | POA: Diagnosis not present

## 2019-02-03 DIAGNOSIS — M4807 Spinal stenosis, lumbosacral region: Secondary | ICD-10-CM | POA: Diagnosis not present

## 2019-02-03 DIAGNOSIS — Z7952 Long term (current) use of systemic steroids: Secondary | ICD-10-CM | POA: Diagnosis not present

## 2019-02-08 DIAGNOSIS — M5136 Other intervertebral disc degeneration, lumbar region: Secondary | ICD-10-CM | POA: Diagnosis not present

## 2019-02-08 DIAGNOSIS — H269 Unspecified cataract: Secondary | ICD-10-CM | POA: Diagnosis not present

## 2019-02-08 DIAGNOSIS — N189 Chronic kidney disease, unspecified: Secondary | ICD-10-CM | POA: Diagnosis not present

## 2019-02-08 DIAGNOSIS — Z7952 Long term (current) use of systemic steroids: Secondary | ICD-10-CM | POA: Diagnosis not present

## 2019-02-08 DIAGNOSIS — K760 Fatty (change of) liver, not elsewhere classified: Secondary | ICD-10-CM | POA: Diagnosis not present

## 2019-02-08 DIAGNOSIS — M4807 Spinal stenosis, lumbosacral region: Secondary | ICD-10-CM | POA: Diagnosis not present

## 2019-02-08 DIAGNOSIS — I129 Hypertensive chronic kidney disease with stage 1 through stage 4 chronic kidney disease, or unspecified chronic kidney disease: Secondary | ICD-10-CM | POA: Diagnosis not present

## 2019-02-10 DIAGNOSIS — N189 Chronic kidney disease, unspecified: Secondary | ICD-10-CM | POA: Diagnosis not present

## 2019-02-10 DIAGNOSIS — H269 Unspecified cataract: Secondary | ICD-10-CM | POA: Diagnosis not present

## 2019-02-10 DIAGNOSIS — I129 Hypertensive chronic kidney disease with stage 1 through stage 4 chronic kidney disease, or unspecified chronic kidney disease: Secondary | ICD-10-CM | POA: Diagnosis not present

## 2019-02-10 DIAGNOSIS — Z7952 Long term (current) use of systemic steroids: Secondary | ICD-10-CM | POA: Diagnosis not present

## 2019-02-10 DIAGNOSIS — M5136 Other intervertebral disc degeneration, lumbar region: Secondary | ICD-10-CM | POA: Diagnosis not present

## 2019-02-10 DIAGNOSIS — M4807 Spinal stenosis, lumbosacral region: Secondary | ICD-10-CM | POA: Diagnosis not present

## 2019-02-10 DIAGNOSIS — K760 Fatty (change of) liver, not elsewhere classified: Secondary | ICD-10-CM | POA: Diagnosis not present

## 2019-02-12 ENCOUNTER — Other Ambulatory Visit: Payer: Self-pay | Admitting: Family Medicine

## 2019-02-12 DIAGNOSIS — F5101 Primary insomnia: Secondary | ICD-10-CM

## 2019-02-12 NOTE — Telephone Encounter (Signed)
Ok to refill??  Last office visit 12/17/2018.  Last refill 01/14/2019.

## 2019-02-15 DIAGNOSIS — H269 Unspecified cataract: Secondary | ICD-10-CM | POA: Diagnosis not present

## 2019-02-15 DIAGNOSIS — K760 Fatty (change of) liver, not elsewhere classified: Secondary | ICD-10-CM | POA: Diagnosis not present

## 2019-02-15 DIAGNOSIS — I129 Hypertensive chronic kidney disease with stage 1 through stage 4 chronic kidney disease, or unspecified chronic kidney disease: Secondary | ICD-10-CM | POA: Diagnosis not present

## 2019-02-15 DIAGNOSIS — M4807 Spinal stenosis, lumbosacral region: Secondary | ICD-10-CM | POA: Diagnosis not present

## 2019-02-15 DIAGNOSIS — N189 Chronic kidney disease, unspecified: Secondary | ICD-10-CM | POA: Diagnosis not present

## 2019-02-15 DIAGNOSIS — Z7952 Long term (current) use of systemic steroids: Secondary | ICD-10-CM | POA: Diagnosis not present

## 2019-02-15 DIAGNOSIS — M5136 Other intervertebral disc degeneration, lumbar region: Secondary | ICD-10-CM | POA: Diagnosis not present

## 2019-02-17 DIAGNOSIS — M4807 Spinal stenosis, lumbosacral region: Secondary | ICD-10-CM | POA: Diagnosis not present

## 2019-02-17 DIAGNOSIS — I129 Hypertensive chronic kidney disease with stage 1 through stage 4 chronic kidney disease, or unspecified chronic kidney disease: Secondary | ICD-10-CM | POA: Diagnosis not present

## 2019-02-17 DIAGNOSIS — Z7952 Long term (current) use of systemic steroids: Secondary | ICD-10-CM | POA: Diagnosis not present

## 2019-02-17 DIAGNOSIS — H269 Unspecified cataract: Secondary | ICD-10-CM | POA: Diagnosis not present

## 2019-02-17 DIAGNOSIS — M5136 Other intervertebral disc degeneration, lumbar region: Secondary | ICD-10-CM | POA: Diagnosis not present

## 2019-02-17 DIAGNOSIS — K760 Fatty (change of) liver, not elsewhere classified: Secondary | ICD-10-CM | POA: Diagnosis not present

## 2019-02-17 DIAGNOSIS — N189 Chronic kidney disease, unspecified: Secondary | ICD-10-CM | POA: Diagnosis not present

## 2019-02-22 ENCOUNTER — Other Ambulatory Visit: Payer: Medicare Other

## 2019-02-25 ENCOUNTER — Other Ambulatory Visit: Payer: Self-pay

## 2019-02-25 ENCOUNTER — Ambulatory Visit (INDEPENDENT_AMBULATORY_CARE_PROVIDER_SITE_OTHER): Payer: Medicare Other | Admitting: Neurology

## 2019-02-25 ENCOUNTER — Encounter: Payer: Self-pay | Admitting: Neurology

## 2019-02-25 VITALS — BP 132/77 | HR 82 | Temp 97.5°F | Ht 63.0 in | Wt 147.0 lb

## 2019-02-25 DIAGNOSIS — R270 Ataxia, unspecified: Secondary | ICD-10-CM

## 2019-02-25 DIAGNOSIS — W19XXXA Unspecified fall, initial encounter: Secondary | ICD-10-CM

## 2019-02-25 DIAGNOSIS — E538 Deficiency of other specified B group vitamins: Secondary | ICD-10-CM | POA: Diagnosis not present

## 2019-02-25 DIAGNOSIS — M6289 Other specified disorders of muscle: Secondary | ICD-10-CM

## 2019-02-25 DIAGNOSIS — G959 Disease of spinal cord, unspecified: Secondary | ICD-10-CM | POA: Diagnosis not present

## 2019-02-25 DIAGNOSIS — G3281 Cerebellar ataxia in diseases classified elsewhere: Secondary | ICD-10-CM

## 2019-02-25 DIAGNOSIS — Z79899 Other long term (current) drug therapy: Secondary | ICD-10-CM | POA: Diagnosis not present

## 2019-02-25 DIAGNOSIS — R269 Unspecified abnormalities of gait and mobility: Secondary | ICD-10-CM | POA: Diagnosis not present

## 2019-02-25 NOTE — Patient Instructions (Signed)
Myopathy Myopathy is a condition that causes muscles to be weak and not work well. There are many different kinds of myopathies. Myopathies may be passed from parent to child (inherited), or they may be caused by external factors (acquired). Inherited myopathies may cause symptoms at birth or early in life. Acquired myopathies may start suddenly at any age. There is no cure for most myopathies. What are the causes? Common causes of myopathy include:  Endocrine disorders, such as thyroid disease.  Metabolic disorders, which are usually inherited.  Infection or inflammation of the muscles. This is often triggered by viruses or because the body's defense system (immune system) is attacking the muscles.  Certain medicines, such as lipid-lowering medicines. In some cases, the cause is not known. What increases the risk? You are more likely to develop this condition if you:  Have a family history of myopathy.  Are female. What are the signs or symptoms? Symptoms of myopathy can range from mild to severe. Common symptoms of this condition include:  Muscle weakness.  Cramps.  Stiffness.  Spasms. These symptoms are usually felt close to the center of the body (proximal). Depending upon the type of myopathy, one muscle group may be more affected than others. In inherited myopathies, symptoms vary among family members. Other symptoms of myopathy include:  Muscle pain or tenderness.  Muscle weakness that gets progressively worse.  Fatigue.  Heart problems.  Trouble breathing.  Trouble swallowing. How is this diagnosed? This condition is diagnosed based on your medical history and tests, which may include:  Blood tests.  Removal of a small piece of muscle tissue to be tested (biopsy).  Electromyogram (EMG).  MRI.  Electrocardiogram (ECG).  Genetic testing. How is this treated? Treatment for this condition depends on the type of myopathy. Treatment may  include:  Over-the-counter medicines such as acetaminophen or ibuprofen.  Prescription medicines, such as disease-modifying antirheumatic drugs (DMARDs) or drugs that suppress the immune system.  Physical therapy.  A brace to help stabilize your muscles. Follow these instructions at home: If you have a brace:  Wear the brace as told by your health care provider. Remove it only as told by your health care provider.  Loosen the brace if any part of your body tingles, becomes numb, or turns cold and blue.  Keep the brace clean.  Check the skin around it every day. Tell your health care provider about any concerns.  If the brace is not waterproof: ? Do not let it get wet. ? Cover it with a watertight covering when you take a bath or shower.  Ask your health care provider when it is safe to drive if you are wearing a brace. General instructions  Take over-the-counter and prescription medicines only as told by your health care provider.  Maintain a healthy weight. Follow instructions from your health care provider about eating, drinking, and physical activity.  If physical therapy is prescribed, do exercises as told by your health care provider or physical therapist.  Keep all follow-up visits as told by your health care provider. This is important. Contact a health care provider if you have:  Trouble managing your symptoms at home.  A fever. Get help right away if you develop:  Breathing problems.  Chest pain. Summary  Myopathy is a condition that causes muscles to be weak and not work well.  There is no cure for most myopathies.  This condition may be treated with medicines, physical therapy, and a brace to help stabilize your   muscles. This information is not intended to replace advice given to you by your health care provider. Make sure you discuss any questions you have with your health care provider. Document Revised: 10/14/2017 Document Reviewed:  10/14/2017 Elsevier Patient Education  2020 Elsevier Inc.  

## 2019-02-25 NOTE — Progress Notes (Addendum)
[facility]    Provider:  Dr Jaynee Eagles Requesting Provider: Suella Broad, MD Primary Care Provider:  Susy Frizzle, MD  CC:  weakness  HPI:  [name] is a 86 y.o. female here as requested by Suella Broad for monoplegia right leg.PMHx HTN, fatty liver (NASH), CKD, degenerative lumbar disk disease, pain right ankle and knee, total hip arthroplasty, osteroarthritis left knee, weakness right leg.  She is here with her son and he provides much information. She can only walk 10 feet she has to sit done. Started 6 months ago. Sitting is fine. Only walking. Started 6 months ago, last December 2019. She has been having chronic back pain for many years, in Dec 2019 she had steroids and she felt better - March 2019 but when she finished started having back pain again and worsening weakness. March walking well unassisted. When steroids completed she started having low back pain bilaterally but no shooting pains intot he legs Slowly progresive weakness and worsening since June now using a walker. The right leg has always been difficult lifting since surgery but the left leg was always fine. Both legs getting weak. She gets exhausted, No shortness of breath, Not noticing the arms being more affected. No speech problems, no focal symptoms to think she had a stroke. She has numbness in the feet and feels her feet have socks on them and she has pain in the feet but again not associated. No muscle wasting or weight loss. Some tremor in the right hand with action. Son provides much information, he says later this summer she started declining. She has multi-joint arthritic changes, swelling in the distal feet, knee pain and needs replacement.   Reviewed notes, labs and imaging from outside physicians, which showed:  MRI lumbar spine personally reviewed images and agree with the following: Multilevel disc and facet degeneration throughout the lumbar spine. Multilevel subarticular and  foraminal stenosis. No significant central canal stenosis.  Review of Systems: Patient complains of symptoms per HPI as well as the following symptoms: HTN, depression, weakness, decreased energy, disinterest in activities, joint pain, joint swelling, hearing loss, fatigue, swelling in legs. Pertinent negatives and positives per HPI. All others negative.   Social History   Socioeconomic History  . Marital status: Widowed    Spouse name: Not on file  . Number of children: 1  . Years of education: Not on file  . Highest education level: Not on file  Occupational History  . Not on file  Tobacco Use  . Smoking status: Never Smoker  . Smokeless tobacco: Never Used  Substance and Sexual Activity  . Alcohol use: Never  . Drug use: Never  . Sexual activity: Not Currently    Comment: raised tobacco, retired  Other Topics Concern  . Not on file  Social History Narrative   Lives alone   Social Determinants of Health   Financial Resource Strain:   . Difficulty of Paying Living Expenses: Not on file  Food Insecurity:   . Worried About Charity fundraiser in the Last Year: Not on file  . Ran Out of Food in the Last Year: Not on file  Transportation Needs:   . Lack of Transportation (Medical): Not on file  . Lack of Transportation (Non-Medical): Not on file  Physical Activity:   . Days of Exercise per Week: Not on file  . Minutes of Exercise per Session: Not on file  Stress:   . Feeling of Stress : Not on file  Social Connections:   . Frequency of Communication with Friends and Family: Not on file  . Frequency of Social Gatherings with Friends and Family: Not on file  . Attends Religious Services: Not on file  . Active Member of Clubs or Organizations: Not on file  . Attends Archivist Meetings: Not on file  . Marital Status: Not on file  Intimate Partner Violence:   . Fear of Current or Ex-Partner: Not on file  . Emotionally Abused: Not on file  . Physically Abused:  Not on file  . Sexually Abused: Not on file    Family History  Problem Relation Age of Onset  . Heart disease Mother   . Cancer Father        metastatic unsure of primary  . Early death Brother        died at 21 y/o  . Liver cancer Maternal Grandmother     Past Medical History:  Diagnosis Date  . Cataract   . CKD (chronic kidney disease) stage 3, GFR 30-59 ml/min   . Fatty liver disease, nonalcoholic    1610- admitted with encephalopathy unsure of cause, resolved sponatneously  . Hypertension     Patient Active Problem List   Diagnosis Date Noted  . Proximal weakness of extremity 02/25/2019  . CKD (chronic kidney disease) stage 3, GFR 30-59 ml/min   . Unilateral primary osteoarthritis, left knee 09/16/2018  . Thoracic compression fracture, sequela 09/16/2018  . Hx of total hip arthroplasty, bilateral 09/16/2018  . Bilateral primary osteoarthritis of knee 08/04/2018  . Essential hypertension 07/08/2018  . Osteopenia 09/13/2014  . Fatty liver disease, nonalcoholic     Past Surgical History:  Procedure Laterality Date  . FRACTURE SURGERY     right shoulder, both wrists   . JOINT REPLACEMENT     HIP- bilateral    Current Outpatient Medications  Medication Sig Dispense Refill  . escitalopram (LEXAPRO) 10 MG tablet Take 1 tablet by mouth once daily 90 tablet 3  . traMADol (ULTRAM) 50 MG tablet TAKE 1 TABLET BY MOUTH EVERY 8 HOURS AS NEEDED FOR PAIN 60 tablet 0  . ALPRAZolam (XANAX) 0.5 MG tablet TAKE 1 TO 2 TABLETS BY MOUTH EVERY 8 HOURS AS NEEDED FOR ANXIETY 60 tablet 2  . amLODipine (NORVASC) 5 MG tablet Take 1 tablet by mouth once daily 90 tablet 3   Current Facility-Administered Medications  Medication Dose Route Frequency Provider Last Rate Last Admin  . denosumab (PROLIA) injection 60 mg  60 mg Subcutaneous Q6 months Susy Frizzle, MD   60 mg at 01/07/19 1033    Allergies as of 02/25/2019 - Review Complete 02/25/2019  Allergen Reaction Noted  .  Pneumococcal vaccines Other (See Comments) 07/14/2014    Vitals: BP 132/77 (BP Location: Left Arm, Patient Position: Sitting)   Pulse 82   Temp (!) 97.5 F (36.4 C) Comment: son 97.6; both taken at front  Ht _0  (1.6 m)   Wt 147 lb (66.7 kg)   BMI 26.04 kg/m  Last Weight:  Wt Readings from Last 1 Encounters:  02/25/19 147 lb (66.7 kg)   Last Height:   Ht Readings from Last 1 Encounters:  02/25/19 _1  (1.6 m)     Physical exam: Exam: Gen: NAD, conversant                     CV: RRR, no MRG. No Carotid Bruits. No peripheral edema, warm, nontender Eyes: Conjunctivae clear without exudates  or hemorrhage  Neuro: Detailed Neurologic Exam  Speech:    Speech is normal; fluent and spontaneous with normal comprehension.  Cognition:    The patient is oriented to person, place, and time;     recent and remote memory intact;     language fluent;     normal attention, concentration,     fund of knowledge Cranial Nerves:    The pupils are equal, round, and reactive to light. Attempted fundoscopy could not visualize due to small pupils. Visual fields are full to finger confrontation. Extraocular movements are intact. Trigeminal sensation is intact and the muscles of mastication are normal. The face is symmetric. The palate elevates in the midline. Hearing intact. Voice is normal. Shoulder shrug is normal. The tongue has normal motion without fasciculations.   Coordination:    Normal finger to nose  Gait:    Great difficulty getting up out of chair, narrow step, good strides and low clearance, knees locked  Motor Observation:    No asymmetry, no atrophy, and no involuntary movements noted. Tone:    Normal muscle tone.    Posture:    Posture is normal. normal erect    Strength: 4/5 left delt, right arm cannot test due to arthritis in the shoulder, right hip flexion 3+/5, left hip flexion 4/5. Strength distally is normal.       Sensation: intact to LT     Reflex  Exam:  DTR's:    Deep tendon reflexes in the upper extremities are brisk bilaterally and absent in the lowers.   Toes:    The toes are equivocal bilaterally.   Clonus:    Clonus is absent.    Assessment/Plan:  86 y.o. female here as requested by Suella Broad for monoplegia right leg.PMHx HTN, fatty liver (NASH), CKD, degenerative lumbar disk disease, pain right ankle and knee, total hip arthroplasty, osteroarthritis left knee, weakness right leg.  She has had 6 months of progressive gait difficulty. She has proximal weakness that is out of proportion to her lumbar spine imaging that does not show any significant canal stenosis, she has Multilevel subarticular and foraminal stenosis and low back pain but I do not think this is causing her weakness as she reports no radicular symptoms.Cannot rule out progressive atrophy due to disuse and chronic pain (she reports severe knee pain, shoulder pain, arthritis, her complaints are significantly based on joint pain), we will perform bloodwork today and emg/ncs. I do not think this is a stroke or vascular gait apraxia. Will check EMG/NCS and MRI brain and cervical spine.   Addendum: Blood work unremarkable and emg needle muscle study surprisingly unremarkable without acute/ongoing denervation or chronic neurogenic changes, it did NOT show myopathy or myositis. MRI of the brain did not show strokes or any other etiology. Mri of the cervical spine showed moderate stenosis with possibly some signal in the cord. I don;t think this is what is causing her symptoms but son and patient would like to be evaluated by neurosurgery, we can do this to get neurosurgery's opinion on myelopathy but again I think chronic pain and disuse may be the cause of her weakness/symptoms will see what neurosurgery also says about her c-spine.   Orders Placed This Encounter  Procedures  . MR BRAIN W WO CONTRAST  . MR CERVICAL SPINE WO CONTRAST  . Acetylcholine receptor, binding  .  Acetylcholine receptor, blocking  . Acetylcholine receptor, modulating  . CK  . Magnesium  . TSH  . Sedimentation rate  .  B12 and Folate Panel  . Rheumatoid factor  . Heavy metals, blood  . Vitamin B6  . Multiple Myeloma Panel (SPEP&IFE w/QIG)  . CBC  . Methylmalonic acid, serum  . Vitamin B1  . MyoMarker 3 Plus Profile (RDL)  . MyoMarker 3 Plus Profile (RDL)  . NCV with EMG(electromyography)   No orders of the defined types were placed in this encounter.   Cc: Susy Frizzle, MD,  Suella Broad MD  Sarina Ill, MD  [facility] [address] [place], [place] [address]  Phone [phone] Fax [phone]  A total of 90 minutes was spent on this patient's care, reviewing imaging, past records, recent hospitalization notes and results. Over half this time was spent on counseling patient on the  1. Proximal weakness of extremity   2. Gait abnormality   3. Cerebellar ataxia in diseases classified elsewhere ([place])   4. Disease of spinal cord ([place])   5. Fall, initial encounter   6. Ataxia    diagnosis and different diagnostic and therapeutic options, counseling and coordination of care, risks and benefitsof management, compliance, or risk factor reduction and education.

## 2019-02-26 ENCOUNTER — Ambulatory Visit: Payer: Medicare Other | Attending: Internal Medicine

## 2019-02-26 NOTE — Progress Notes (Signed)
   Covid-19 Vaccination Clinic  Name:  [name]    MRN: [id number] DOB: 09-11-1931  02/26/2019  [name] was observed post Covid-19 immunization for 15 minutes without incidence. She was provided with Vaccine Information Sheet and instruction to access the V-Safe system.   [name] was instructed to call 911 with any severe reactions post vaccine: Marland Kitchen Difficulty breathing  . Swelling of your face and throat  . A fast heartbeat  . A bad rash all over your body  . Dizziness and weakness

## 2019-03-01 NOTE — Progress Notes (Signed)
Labs so far normal, but still awaiting several. We can discuss the rest at emg/ncs. thanks

## 2019-03-04 ENCOUNTER — Other Ambulatory Visit: Payer: Self-pay | Admitting: Family Medicine

## 2019-03-09 ENCOUNTER — Telehealth: Payer: Self-pay | Admitting: *Deleted

## 2019-03-09 NOTE — Telephone Encounter (Signed)
-----   Message from Anson Fret, MD sent at 03/01/2019  5:50 PM EST ----- Labs so far normal, but still awaiting several. We can discuss the rest at emg/ncs. thanks

## 2019-03-09 NOTE — Telephone Encounter (Signed)
Called pt & LVM (ok per DPR) advising her of the result message below from Dr. Lucia Gaskins. Left office number in message in case pt has any questions. Otherwise we will see her at EMG/NCS next Thursday 2/9 @ 1:15 pm and per Dr. Lucia Gaskins can discuss the rest of the labs then.

## 2019-03-10 ENCOUNTER — Other Ambulatory Visit: Payer: Self-pay | Admitting: Family Medicine

## 2019-03-10 DIAGNOSIS — F5101 Primary insomnia: Secondary | ICD-10-CM

## 2019-03-10 NOTE — Telephone Encounter (Signed)
Pt is requesting refill on Xanax   LOV: 12/17/18  LRF:   02/12/19

## 2019-03-12 LAB — MYOMARKER 3 PLUS PROFILE (RDL)
Anti-EJ Ab (RDL): NEGATIVE
Anti-Jo-1 Ab (RDL): <20 Units (ref <20)
Anti-Ku Ab (RDL): NEGATIVE
Anti-MDA-5 Ab (CADM-140)(RDL): <20 Units (ref <20)
Anti-Mi-2 Ab (RDL): NEGATIVE
Anti-NXP-2 (P140) Ab (RDL): 20 Units — ABNORMAL HIGH (ref <20)
Anti-OJ Ab (RDL): NEGATIVE
Anti-PL-12 Ab (RDL: NEGATIVE
Anti-PL-7 Ab (RDL): NEGATIVE
Anti-PM/Scl-100 Ab (RDL): <20 Units (ref <20)
Anti-SAE1 Ab, IgG (RDL): <20 Units (ref <20)
Anti-SRP Ab (RDL): NEGATIVE
Anti-SS-A 52kD Ab, IgG (RDL): <20 Units (ref <20)
Anti-TIF-1gamma Ab (RDL): <20 Units (ref <20)
Anti-U1 RNP Ab (RDL): <20 Units (ref <20)
Anti-U2 RNP Ab (RDL): NEGATIVE
Anti-U3 RNP (Fibrillarin)(RDL): NEGATIVE

## 2019-03-12 LAB — CBC
Hematocrit: 43.1 % (ref 34.0–46.6)
Hemoglobin: 14.1 g/dL (ref 11.1–15.9)
MCH: 30.3 pg (ref 26.6–33.0)
MCHC: 32.7 g/dL (ref 31.5–35.7)
MCV: 93 fL (ref 79–97)
Platelets: 253 10*3/uL (ref 150–450)
RBC: 4.65 x10E6/uL (ref 3.77–5.28)
RDW: 12.8 % (ref 11.7–15.4)
WBC: 5.5 10*3/uL (ref 3.4–10.8)

## 2019-03-12 LAB — TSH: TSH: 0.703 u[IU]/mL (ref 0.450–4.500)

## 2019-03-12 LAB — VITAMIN B6: Vitamin B6: 4.1 ug/L (ref 2.0–32.8)

## 2019-03-12 LAB — HEAVY METALS, BLOOD
Arsenic: 6 ug/L (ref 2–23)
Lead, Blood: <1 ug/dL (ref 0–4)
Mercury: <1 ug/L (ref 0.0–14.9)

## 2019-03-12 LAB — MULTIPLE MYELOMA PANEL, SERUM
Albumin SerPl Elph-Mcnc: 3.6 g/dL (ref 2.9–4.4)
Albumin/Glob SerPl: 1.3 (ref 0.7–1.7)
Alpha 1: 0.3 g/dL (ref 0.0–0.4)
Alpha2 Glob SerPl Elph-Mcnc: 0.8 g/dL (ref 0.4–1.0)
B-Globulin SerPl Elph-Mcnc: 1 g/dL (ref 0.7–1.3)
Gamma Glob SerPl Elph-Mcnc: 0.8 g/dL (ref 0.4–1.8)
Globulin, Total: 2.9 g/dL (ref 2.2–3.9)
IgA/Immunoglobulin A, Serum: 156 mg/dL (ref 64–422)
IgG (Immunoglobin G), Serum: 846 mg/dL (ref 586–1602)
IgM (Immunoglobulin M), Srm: 59 mg/dL (ref 26–217)
Total Protein: 6.5 g/dL (ref 6.0–8.5)

## 2019-03-12 LAB — B12 AND FOLATE PANEL
Folate: 10 ng/mL (ref >3.0)
Vitamin B-12: 464 pg/mL (ref 232–1245)

## 2019-03-12 LAB — ACETYLCHOLINE RECEPTOR, MODULATING: Acetylcholine Modulat Ab: <12 % (ref 0–20)

## 2019-03-12 LAB — ACETYLCHOLINE RECEPTOR, BLOCKING: Acetylchol Block Ab: 17 % (ref 0–25)

## 2019-03-12 LAB — MAGNESIUM: Magnesium: 2.2 mg/dL (ref 1.6–2.3)

## 2019-03-12 LAB — VITAMIN B1: Thiamine: 120.3 nmol/L (ref 66.5–200.0)

## 2019-03-12 LAB — CK: Total CK: 47 U/L (ref 26–161)

## 2019-03-12 LAB — METHYLMALONIC ACID, SERUM: Methylmalonic Acid: 205 nmol/L (ref 0–378)

## 2019-03-12 LAB — RHEUMATOID FACTOR: Rhuematoid fact SerPl-aCnc: <10 IU/mL (ref 0.0–13.9)

## 2019-03-12 LAB — SEDIMENTATION RATE: Sed Rate: 9 mm/hr (ref 0–40)

## 2019-03-12 LAB — ACETYLCHOLINE RECEPTOR, BINDING: AChR Binding Ab, Serum: <0.03 nmol/L (ref 0.00–0.24)

## 2019-03-15 ENCOUNTER — Telehealth: Payer: Self-pay | Admitting: *Deleted

## 2019-03-15 NOTE — Telephone Encounter (Signed)
Received request from pharmacy for PA on Xanax.   PA submitted.   Dx: F32/ F42- GAD/MDD.

## 2019-03-15 NOTE — Telephone Encounter (Signed)
[facility] is reviewing your PA request. Typically an electronic response will be received within 72 hours. To check for an update later, open this request from your dashboard.    You may close this dialog and return to your dashboard to perform other tasks.

## 2019-03-16 ENCOUNTER — Ambulatory Visit (INDEPENDENT_AMBULATORY_CARE_PROVIDER_SITE_OTHER): Payer: Medicare Other | Admitting: Neurology

## 2019-03-16 ENCOUNTER — Other Ambulatory Visit: Payer: Self-pay

## 2019-03-16 DIAGNOSIS — M6289 Other specified disorders of muscle: Secondary | ICD-10-CM | POA: Diagnosis not present

## 2019-03-16 DIAGNOSIS — Z0289 Encounter for other administrative examinations: Secondary | ICD-10-CM

## 2019-03-16 DIAGNOSIS — M6281 Muscle weakness (generalized): Secondary | ICD-10-CM

## 2019-03-16 NOTE — Progress Notes (Signed)
Full Name: [name] Gender: Female MRN #: [id number] Date of Birth: 25-Nov-1931    Visit Date: 03/16/2019 13:11 Age: 86 Years Examining Physician: Naomie Dean, MD   History: History: Patient with progressive weakness more proximal in distribution.   Summary: EMG/NCS was performed on the bilateral upper extremities and right lower extremity. The left median motor nerve showed decreased conduction velocity(59m/s, N>49). The left Ulnar nerve showed delayed distal onset latency(3.65ms, N<3.3) and reduced amplitude 2.75mV, N>6). The right Peroneal motor nerve showed reduced amplitude(0.77mV, N>2). The left Peroneal motor nerve showed reduced amplitude(1.92mV, N>2). The right sural, left sural, right superficial peroneal and left superficial peroneal nerves showed no response. The left median orthodromic sensory nerve showed delayed distal peak latency(4.63ms, N<3.4) and reduced amplitude(4uV, N>10). The left ulnar orthodromic sensory nerve showed delayed distal peak latency(3.62ms, N<3.1) and reduced amplitude(2uV, N>5). The right Tibial F Wave showed delayed latency(62.70ms, N<56). The left Tibial F Wave showed delayed latency(59.26ms, N<56). All remaining nerves (as indicated in the following tables) were within normal limits.  All muscles (as indicated in the following tables) were within normal limits.      Conclusion: There is length-dependent, axonal, sensorimotor polyneuropathy. No suggestion or radiculopathy, myopathy/myositis or muscle disease.    Naomie Dean M.D.  [facility] [address] [place], [place] [address] Tel: [phone] Fax: [phone]         [facility]    Nerve / Sites Muscle Latency Ref. Amplitude Ref. Rel Amp Segments Distance Velocity Ref. Area    ms ms mV mV %  cm m/s m/s mVms  L Median - APB     Wrist APB 4.4 ?4.4 4.4 ?4.0 100 Wrist - APB 7   14.1     Upper arm APB 9.0  3.9  87.2 Upper arm - Wrist 20 44 ?49 13.0  L Ulnar - ADM     Wrist ADM  3.7 ?3.3 2.8 ?6.0 100 Wrist - ADM 7   4.7     B.Elbow ADM 7.3  6.6  240 B.Elbow - Wrist 19 52 ?49 23.2     A.Elbow ADM 9.1  6.4  96.4 A.Elbow - B.Elbow 10 56 ?49 22.8     8 FDI 7.1  0.4  6.12 A.Elbow - Wrist    0.7  R Peroneal - EDB     Ankle EDB 6.3 ?6.5 0.7 ?2.0 100 Ankle - EDB 9   3.8     Fib head EDB 13.2  0.6  77.6 Fib head - Ankle 26 44 ?44 3.6     Pop fossa EDB 16.1  0.3  51 Pop fossa - Fib head 10 44 ?44 2.4         Pop fossa - Ankle      L Peroneal - EDB     Ankle EDB 6.4 ?6.5 1.2 ?2.0 100 Ankle - EDB 9   3.6     Fib head EDB 13.0  1.0  87.4 Fib head - Ankle 26 44 ?44 4.7     Pop fossa EDB 15.4  0.9  90.8 Pop fossa - Fib head 10 44 ?44 4.6         Pop fossa - Ankle      R Tibial - AH     Ankle AH 5.4 ?5.8 5.2 ?4.0 100 Ankle - AH 9   15.7     Pop fossa AH 14.4  3.3  63.1 Pop fossa - Ankle 37 41 ?41 15.8  L Tibial - AH     Ankle AH 5.1 ?5.8 4.4 ?4.0 100 Ankle - AH 9   18.9     Pop fossa AH 14.1  3.4  77.2 Pop fossa - Ankle 37 41 ?41 15.0                   SNC    Nerve / Sites Rec. Site Peak Lat Ref.  Amp Ref. Segments Distance    ms ms V V  cm  R Sural - Ankle (Calf)     Calf Ankle NR ?4.4 NR ?6 Calf - Ankle 14  L Sural - Ankle (Calf)     Calf Ankle NR ?4.4 NR ?6 Calf - Ankle 14  R Superficial peroneal - Ankle     Lat leg Ankle NR ?4.4 NR ?6 Lat leg - Ankle 14  L Superficial peroneal - Ankle     Lat leg Ankle NR ?4.4 NR ?6 Lat leg - Ankle 14  L Median - Orthodromic (Dig II, Mid palm)     Dig II Wrist 4.5 ?3.4 4 ?10 Dig II - Wrist 13  L Ulnar - Orthodromic, (Dig V, Mid palm)     Dig V Wrist 3.8 ?3.1 2 ?5 Dig V - Wrist 30                 F  Wave    Nerve F Lat Ref.   ms ms  R Tibial - AH 62.4 ?56.0  L Tibial - AH 59.2 ?56.0  L Ulnar - ADM 31.3 ?32.0            EMG Summary Table    Spontaneous MUAP Recruitment  Muscle IA Fib PSW Fasc Other Amp Dur. Poly Pattern  L. Deltoid Normal None None None _______ Normal Normal Normal Normal  L. Triceps brachii Normal  None None None _______ Normal Normal Normal Normal  L. Pronator teres Normal None None None _______ Normal Normal Normal Normal  L. First dorsal interosseous and ADM Normal None None None _______ Normal Normal Normal Normal  L. Cervical paraspinals (low) Normal None None None _______ Normal Normal Normal Normal  R. Iliopsoas Normal None None None _______ Normal Normal Normal Normal  R. Vastus medialis Normal None None None _______ Normal Normal Normal Normal  R. Gastrocnemius (Medial head) Normal None None None _______ Normal Normal Normal Normal  R. Tibialis anterior and R Extensor Hallucis Longus Normal None None None _______ Normal Normal Normal Normal  R. Biceps femoris (long head) Normal None None None _______ Normal Normal Normal Normal  R. Gluteus maximus Normal None None None _______ Normal Normal Normal Normal  R. Gluteus medius Normal None None None _______ Normal Normal Normal Normal  R. Lumbar paraspinals (low) an mid Thoracic paraspinals Normal None None None _______ Normal Normal Normal Normal

## 2019-03-16 NOTE — Telephone Encounter (Signed)
Received PA determination.   PA- [id number] approved through 02/04/2020.  Pharmacy made aware.

## 2019-03-16 NOTE — Progress Notes (Signed)
See procedure note.

## 2019-03-19 ENCOUNTER — Ambulatory Visit: Payer: Medicare Other | Attending: Internal Medicine

## 2019-03-19 DIAGNOSIS — Z23 Encounter for immunization: Secondary | ICD-10-CM | POA: Insufficient documentation

## 2019-03-19 NOTE — Progress Notes (Signed)
   Covid-19 Vaccination Clinic  Name:  [name]    MRN: [id number] DOB: 14-Mar-1931  03/19/2019  [name] was observed post Covid-19 immunization for 15 minutes without incidence. She was provided with Vaccine Information Sheet and instruction to access the V-Safe system.   [name] was instructed to call 911 with any severe reactions post vaccine: Marland Kitchen Difficulty breathing  . Swelling of your face and throat  . A fast heartbeat  . A bad rash all over your body  . Dizziness and weakness    Immunizations Administered    Name Date Dose VIS Date Route   Pfizer COVID-19 Vaccine 03/19/2019  5:22 PM 0.3 mL 01/15/2019 Intramuscular   Manufacturer: [employer], [employer]   Lot: [id number]   NDC: [id number]

## 2019-03-21 NOTE — Progress Notes (Signed)
History:  86 y.o. female here as requested by Suella Broad for monoplegia right leg.PMHx HTN, fatty liver (NASH), CKD, degenerative lumbar disk disease, pain right ankle and knee, total hip arthroplasty, osteroarthritis left knee, weakness right leg.  She has had 6 months of progressive gait difficulty. She has proximal weakness that is out of proportion to her lumbar spine imaging that does not show any significant canal stenosis, she has Multilevel subarticular and foraminal stenosis and low back pain but I do not think this is causing her weakness as she reports no radicular symptoms. She may have a myopathy or myositis but cannot rule out progressive atrophy due to disuse and chronic pain (she reports severe knee pain, shoulder pain, arthritis, her complaints are significantly based on joint pain), we will perform bloodwork today and emg/ncs. I do not think this is a stroke or vascular gait apraxia. She was placed on steroids a year ago and felt great so she may have been symptomatic with a myositis at that time and then deteriorated when taken off of the steroids but cannot rule out other more benign causes as above.  Today we reviewed in detail with patient and son. All bloodwork below unremarkable and emg needle muscle study surprisingly unremarkable without acute/ongoing denervation or chronic neurogenic changes. Needs MRI brain and cervical spine. Will also send for muscle biopsy of the left deltoid and right iliopsoas if MRI brain/c-spine unrevealing  Orders Placed This Encounter  Procedures  . MR BRAIN W WO CONTRAST  . MR CERVICAL SPINE WO CONTRAST  . Acetylcholine receptor, binding  . Acetylcholine receptor, blocking  . Acetylcholine receptor, modulating  . CK  . Magnesium  . TSH  . Sedimentation rate  . B12 and Folate Panel  . Rheumatoid factor  . Heavy metals, blood  . Vitamin B6  . Multiple Myeloma Panel (SPEP&IFE w/QIG)  . CBC  . Methylmalonic acid, serum  . Vitamin B1  .  MyoMarker 3 Plus Profile (RDL)  . MyoMarker 3 Plus Profile (RDL)  . NCV with EMG(electromyography)   A total of 25 minutes was spent face to face with son and patient,  on this patient's care, reviewing imaging, past records, recent hospitalization and/or office notes and results. Over half this time was spent on counseling patient on the  1. Proximal weakness of extremity    diagnosis and different diagnostic and therapeutic options, counseling and coordination of care, risks and benefitsof management, compliance, or risk factor reduction and education.  This does not include time sent on emg/ncs procedure.

## 2019-03-21 NOTE — Addendum Note (Signed)
Addended by: Naomie Dean B on: 03/21/2019 09:20 PM   Modules accepted: Orders

## 2019-03-22 ENCOUNTER — Telehealth: Payer: Self-pay | Admitting: Neurology

## 2019-03-22 NOTE — Procedures (Signed)
Full Name: [name] Gender: Female MRN #: [id number] Date of Birth: 25-Nov-1931    Visit Date: 03/16/2019 13:11 Age: 86 Years Examining Physician: Naomie Dean, MD   History: History: Patient with progressive weakness more proximal in distribution.   Summary: EMG/NCS was performed on the bilateral upper extremities and right lower extremity. The left median motor nerve showed decreased conduction velocity(59m/s, N>49). The left Ulnar nerve showed delayed distal onset latency(3.65ms, N<3.3) and reduced amplitude 2.75mV, N>6). The right Peroneal motor nerve showed reduced amplitude(0.77mV, N>2). The left Peroneal motor nerve showed reduced amplitude(1.92mV, N>2). The right sural, left sural, right superficial peroneal and left superficial peroneal nerves showed no response. The left median orthodromic sensory nerve showed delayed distal peak latency(4.63ms, N<3.4) and reduced amplitude(4uV, N>10). The left ulnar orthodromic sensory nerve showed delayed distal peak latency(3.62ms, N<3.1) and reduced amplitude(2uV, N>5). The right Tibial F Wave showed delayed latency(62.70ms, N<56). The left Tibial F Wave showed delayed latency(59.26ms, N<56). All remaining nerves (as indicated in the following tables) were within normal limits.  All muscles (as indicated in the following tables) were within normal limits.      Conclusion: There is length-dependent, axonal, sensorimotor polyneuropathy. No suggestion or radiculopathy, myopathy/myositis or muscle disease.    Naomie Dean M.D.  [facility] [address] [place], [place] [address] Tel: [phone] Fax: [phone]         [facility]    Nerve / Sites Muscle Latency Ref. Amplitude Ref. Rel Amp Segments Distance Velocity Ref. Area    ms ms mV mV %  cm m/s m/s mVms  L Median - APB     Wrist APB 4.4 ?4.4 4.4 ?4.0 100 Wrist - APB 7   14.1     Upper arm APB 9.0  3.9  87.2 Upper arm - Wrist 20 44 ?49 13.0  L Ulnar - ADM     Wrist ADM  3.7 ?3.3 2.8 ?6.0 100 Wrist - ADM 7   4.7     B.Elbow ADM 7.3  6.6  240 B.Elbow - Wrist 19 52 ?49 23.2     A.Elbow ADM 9.1  6.4  96.4 A.Elbow - B.Elbow 10 56 ?49 22.8     8 FDI 7.1  0.4  6.12 A.Elbow - Wrist    0.7  R Peroneal - EDB     Ankle EDB 6.3 ?6.5 0.7 ?2.0 100 Ankle - EDB 9   3.8     Fib head EDB 13.2  0.6  77.6 Fib head - Ankle 26 44 ?44 3.6     Pop fossa EDB 16.1  0.3  51 Pop fossa - Fib head 10 44 ?44 2.4         Pop fossa - Ankle      L Peroneal - EDB     Ankle EDB 6.4 ?6.5 1.2 ?2.0 100 Ankle - EDB 9   3.6     Fib head EDB 13.0  1.0  87.4 Fib head - Ankle 26 44 ?44 4.7     Pop fossa EDB 15.4  0.9  90.8 Pop fossa - Fib head 10 44 ?44 4.6         Pop fossa - Ankle      R Tibial - AH     Ankle AH 5.4 ?5.8 5.2 ?4.0 100 Ankle - AH 9   15.7     Pop fossa AH 14.4  3.3  63.1 Pop fossa - Ankle 37 41 ?41 15.8  L Tibial - AH     Ankle AH 5.1 ?5.8 4.4 ?4.0 100 Ankle - AH 9   18.9     Pop fossa AH 14.1  3.4  77.2 Pop fossa - Ankle 37 41 ?41 15.0                   SNC    Nerve / Sites Rec. Site Peak Lat Ref.  Amp Ref. Segments Distance    ms ms V V  cm  R Sural - Ankle (Calf)     Calf Ankle NR ?4.4 NR ?6 Calf - Ankle 14  L Sural - Ankle (Calf)     Calf Ankle NR ?4.4 NR ?6 Calf - Ankle 14  R Superficial peroneal - Ankle     Lat leg Ankle NR ?4.4 NR ?6 Lat leg - Ankle 14  L Superficial peroneal - Ankle     Lat leg Ankle NR ?4.4 NR ?6 Lat leg - Ankle 14  L Median - Orthodromic (Dig II, Mid palm)     Dig II Wrist 4.5 ?3.4 4 ?10 Dig II - Wrist 13  L Ulnar - Orthodromic, (Dig V, Mid palm)     Dig V Wrist 3.8 ?3.1 2 ?5 Dig V - Wrist 11                 F  Wave    Nerve F Lat Ref.   ms ms  R Tibial - AH 62.4 ?56.0  L Tibial - AH 59.2 ?56.0  L Ulnar - ADM 31.3 ?32.0            EMG Summary Table    Spontaneous MUAP Recruitment  Muscle IA Fib PSW Fasc Other Amp Dur. Poly Pattern  L. Deltoid Normal None None None _______ Normal Normal Normal Normal  L. Triceps brachii Normal  None None None _______ Normal Normal Normal Normal  L. Pronator teres Normal None None None _______ Normal Normal Normal Normal  L. First dorsal interosseous and ADM Normal None None None _______ Normal Normal Normal Normal  L. Cervical paraspinals (low) Normal None None None _______ Normal Normal Normal Normal  R. Iliopsoas Normal None None None _______ Normal Normal Normal Normal  R. Vastus medialis Normal None None None _______ Normal Normal Normal Normal  R. Gastrocnemius (Medial head) Normal None None None _______ Normal Normal Normal Normal  R. Tibialis anterior and R Extensor Hallucis Longus Normal None None None _______ Normal Normal Normal Normal  R. Biceps femoris (long head) Normal None None None _______ Normal Normal Normal Normal  R. Gluteus maximus Normal None None None _______ Normal Normal Normal Normal  R. Gluteus medius Normal None None None _______ Normal Normal Normal Normal  R. Lumbar paraspinals (low) an mid Thoracic paraspinals Normal None None None _______ Normal Normal Normal Normal       

## 2019-03-22 NOTE — Telephone Encounter (Signed)
[employer] medicare/medicaid order sent to [facility]. No auth they will reach out to the patient to schedule.  °

## 2019-04-08 ENCOUNTER — Encounter: Payer: Self-pay | Admitting: Family Medicine

## 2019-04-08 ENCOUNTER — Telehealth: Payer: Self-pay | Admitting: Family Medicine

## 2019-04-08 DIAGNOSIS — R928 Other abnormal and inconclusive findings on diagnostic imaging of breast: Secondary | ICD-10-CM | POA: Diagnosis not present

## 2019-04-08 DIAGNOSIS — N6322 Unspecified lump in the left breast, upper inner quadrant: Secondary | ICD-10-CM | POA: Diagnosis not present

## 2019-04-08 DIAGNOSIS — M85831 Other specified disorders of bone density and structure, right forearm: Secondary | ICD-10-CM | POA: Diagnosis not present

## 2019-04-08 LAB — HM DEXA SCAN

## 2019-04-08 NOTE — Telephone Encounter (Signed)
Patient is calling to get an rx sent for a wheelchair if possible to medicare  [phone]

## 2019-04-09 NOTE — Telephone Encounter (Signed)
Please call her and schedule her a face to face evaluation for the wheelchair.

## 2019-04-12 ENCOUNTER — Ambulatory Visit: Payer: Medicare Other

## 2019-04-13 ENCOUNTER — Ambulatory Visit
Admission: RE | Admit: 2019-04-13 | Discharge: 2019-04-13 | Disposition: A | Discharge status: Home / Self Care | Payer: Medicare Other | Source: Ambulatory Visit | Attending: Neurology | Admitting: Neurology

## 2019-04-13 DIAGNOSIS — W19XXXA Unspecified fall, initial encounter: Secondary | ICD-10-CM

## 2019-04-13 DIAGNOSIS — M6289 Other specified disorders of muscle: Secondary | ICD-10-CM | POA: Diagnosis not present

## 2019-04-13 DIAGNOSIS — R269 Unspecified abnormalities of gait and mobility: Secondary | ICD-10-CM | POA: Diagnosis not present

## 2019-04-13 DIAGNOSIS — G3281 Cerebellar ataxia in diseases classified elsewhere: Secondary | ICD-10-CM

## 2019-04-13 DIAGNOSIS — R270 Ataxia, unspecified: Secondary | ICD-10-CM | POA: Diagnosis not present

## 2019-04-13 DIAGNOSIS — G959 Disease of spinal cord, unspecified: Secondary | ICD-10-CM

## 2019-04-13 MED ORDER — GADOBENATE DIMEGLUMINE 529 MG/ML IV SOLN
6.0000 mL | Freq: Once | INTRAVENOUS | Status: AC | PRN
  Administered 2019-04-13: 6 mL via INTRAVENOUS

## 2019-04-15 ENCOUNTER — Other Ambulatory Visit: Payer: Self-pay

## 2019-04-15 ENCOUNTER — Encounter: Payer: Self-pay | Admitting: Family Medicine

## 2019-04-15 ENCOUNTER — Ambulatory Visit (INDEPENDENT_AMBULATORY_CARE_PROVIDER_SITE_OTHER): Payer: Medicare Other | Admitting: Family Medicine

## 2019-04-15 VITALS — BP 130/78 | HR 88 | Temp 98.0°F | Resp 16 | Ht 63.0 in | Wt 146.0 lb

## 2019-04-15 DIAGNOSIS — M4807 Spinal stenosis, lumbosacral region: Secondary | ICD-10-CM | POA: Diagnosis not present

## 2019-04-15 DIAGNOSIS — M5136 Other intervertebral disc degeneration, lumbar region: Secondary | ICD-10-CM | POA: Diagnosis not present

## 2019-04-15 DIAGNOSIS — N183 Chronic kidney disease, stage 3 unspecified: Secondary | ICD-10-CM

## 2019-04-15 DIAGNOSIS — M6289 Other specified disorders of muscle: Secondary | ICD-10-CM

## 2019-04-15 DIAGNOSIS — R29898 Other symptoms and signs involving the musculoskeletal system: Secondary | ICD-10-CM

## 2019-04-15 LAB — BASIC METABOLIC PANEL WITH GFR
BUN/Creatinine Ratio: 17 (calc) (ref 6–22)
BUN: 20 mg/dL (ref 7–25)
CO2: 26 mmol/L (ref 20–32)
Calcium: 9.4 mg/dL (ref 8.6–10.4)
Chloride: 103 mmol/L (ref 98–110)
Creat: 1.16 mg/dL — ABNORMAL HIGH (ref 0.60–0.88)
GFR, Est African American: 50 mL/min/{1.73_m2} — ABNORMAL LOW (ref >=60)
GFR, Est Non African American: 43 mL/min/{1.73_m2} — ABNORMAL LOW (ref >=60)
Glucose, Bld: 82 mg/dL (ref 65–99)
Potassium: 4.3 mmol/L (ref 3.5–5.3)
Sodium: 139 mmol/L (ref 135–146)

## 2019-04-15 MED ORDER — GABAPENTIN 100 MG PO CAPS: 100.0000 mg | ORAL_CAPSULE | Freq: Three times a day (TID) | ORAL | 3 refills | Status: DC | PRN

## 2019-04-15 NOTE — Progress Notes (Signed)
Subjective:    Patient ID: [name], female    DOB: 05-28-1931, 86 y.o.   MRN: [id number]  HPI 9/19 Patient presents today complaining of elevated blood pressure.  She states that over the weekend, her blood pressure was as high as 170 systolic over 90 diastolic.  It has been steadily rising over the last few weeks.  She denies any chest pain shortness of breath or dyspnea on exertion.  She became alarmed and therefore this weekend she took 1 of her old blood pressure medications, losartan HCTZ.  Her blood pressure today is well controlled however she has taken her old medication twice over the weekend.  We previously discontinued the medication due to acute kidney injury in the early summer with a rise in her creatinine to 1.73 as well as an elevated potassium of 5.9.  Even after discontinuing losartan HCTZ, her potassium remain borderline elevated at 5.2.  Patient also complains of knee pain and ankle pain.  Early this summer, she tripped over an exposed pipe trading from the wall of her home striking her right shin as she walked by.  She fell on her lateral right ankle as well as her right knee.  Ever since that time, she complains of pain and stiffness in the right knee particularly in the posterior lateral aspect of the right knee as well as swelling behind the knee in the popliteal fossa.  She also complains of pain distal and anterior to the lateral malleolus in the area of the anterior talofibular ligament.  Initially she can barely put weight on her foot however over the last few weeks, the pain has improved and subsided.  She still has pain with ambulation.  She also reports subjective swelling around the right knee.  At that time, my plan was: DC losartan HCTZ due to her history of acute kidney injury as well as hyperkalemia.  Replace with amlodipine 5 mg a day and monitor her blood pressure.  I believe the pain in her right knee is likely due to the fall, osteoarthritis, and possibly  a lateral meniscal tear although the exam is not specific today.  She cannot tolerate NSAIDs due to her mild chronic kidney disease.  Therefore I offered the patient a cortisone injection in the right knee.  She politely declined as the pain is not that severe.  I believe the pain in her right ankle is likely due to a sprain in the anterior talofibular ligament.  This is slowly improving.  I recommended the patient wear an ASO brace with ambulation to provide more ankle support and reassess in 2 to 3 weeks or sooner if worsening.  Patient is bearing weight without any difficulty and therefore I believe the fracture is unlikely.  12/01/18 Patient presents today complaining of problems in both her back and her legs.  She saw my partner in June who obtained a CT scan of the lumbar spine.  The results of the CT scan are included below:  IMPRESSION: Mild inferior endplate compression fracture of T11 and superior endplate compression fracture of T12 appear late subacute to remote. The fractures are most in keeping with senile osteoporotic/posttraumatic injuries.  Calcified right subarticular recess and foraminal protrusion at T11-12 could impact the exiting right T11 and descending right T12 roots.  Scattered foraminal narrowing appears worst at L3-4 where it is moderate to moderately severe and greater on the right.  Patient was referred to orthopedic surgery.  They have recommended bilateral knee replacements due  to osteoarthritis in the knees.  However today the patient's history seems confusing to me.  She denies any knee pain.  She does report audible crepitus in the knee however the majority of the pain is in her lower back.  She also complains of severe weakness in the legs.  Muscle strength is 5/5 equal and symmetric with knee extension and knee flexion as well as ankle dorsiflexion and ankle plantarflexion however hip flexion is extremely weak and is at best 3/5.  Hip extension is also weak  and is at best 4/5.  I am also unable to elicit any reflexes in either knee at the patella.  Patient states that as soon she stands her legs feel extremely weak.  I performed time stand up and go test.  The patient can only push herself to a standing position using her arms.  Her legs wobble and sway the entire time as if she is barely able to support her weight.  I believe that deconditioning has led to severe muscle atrophy in the proximal hip muscles.  Patient reports that she is also falling and feels unsteady on her feet.  She is having to use a walker.  However the majority of the pains in her lower back.  At that time, my plan was: Patient has significant leg weakness.  Some of this could be due to deconditioning however I am concerned about possible neurogenic claudication.  I have recommended physical therapy consultation at home to try to improve the strength in her proximal muscles in her legs to reduce her risk of following.  Meanwhile I will start the patient on a prednisone taper pack for what I suspect a spinal stenosis.  Recheck the patient in 1 week.  If the pain in her lower back is improving I would recommend an MRI of her back at that time to determine if she would benefit from epidural steroid injections and to determine the level where she would benefit the most.  Her exam today suggest more of spinal stenosis with resultant leg weakness.  12/08/18  Patient presents today for follow-up.  She saw some improvement on the prednisone perhaps 25% improvement.  She reports good days and bad days however today is a bad day.  She continues to report severe pain in the center of her back.  She also continues to demonstrate proximal leg muscle weakness.  She has barely able to stand from a seated position and this is only with holding onto a walker.  She is now having to use a walker simply to walk.  She does not feel deconditioning is causing this because she states that she is active every day  cleaning her house and using a leaf blower however she progressively is getting weaker and weaker and is requiring a walker now to walk.  She also reports some numbness and burning in both feet.  Both legs frequently give out on her and she has fallen several times.  I am concerned about neurogenic claudication.  She also continues to have severe pain in both knees secondary to osteoarthritis of the knees however my biggest concern is her progressive muscle weakness in both legs.  At that time, my plan was: Leg weakness seems to be progressively worsening.  I am concerned about neurogenic claudication.  She has yet to hear from physical therapy however she is falling more frequently and is now having to use a walker simply to get out of a chair.  Therefore I will  proceed with an MRI of the lumbar spine to evaluate further.  If there is significant lumbar spinal stenosis, I would recommend a referral to neurosurgery to discuss treatment options or perhaps epidural steroid injections particular to improve her leg weakness.  Patient was also recommended not to take any further NSAIDs as her lab work shows chronic kidney disease and the NSAIDs are likely exacerbating this.  She can use tramadol.  Await the results of the MRI  12/17/18 MRI revealed:  L3-4: 5 mm retrolisthesis with disc and facet degeneration. Diffuse endplate spurring. Severe subarticular and foraminal stenosis on the right. Moderate subarticular and foraminal stenosis on the left. Spinal canal adequate in size  L4-5: Disc degeneration and spurring. Moderate subarticular stenosis on the right due to spurring  L5-S1: Disc degeneration with endplate spurring. Moderate subarticular and foraminal stenosis bilaterally due to spurring.  Patient is here today with her son to discuss the findings.  Her biggest concern is her inability to walk.  She states that she has a difficult time standing from a seated position due to the weakness in her  legs.  When she does stand, her legs feel extremely wobbly and feel like they are going to give way on her.  After standing for a short period of time she has to sit down because of the weakness in her legs particularly the proximal muscles in the quadriceps and hamstrings bilaterally.  She also has significant pain in both knees however her leg weakness and low back pain seem to be her bigger issues.  At that time, my plan was: Patient has severe subarticular stenosis at L3-L4 and moderate subarticular and foraminal stenosis at multiple other levels.  I question if she would benefit from possible epidural steroid injections in the back.  There may be an element of nerve compression due to the foraminal stenosis that could be causing lumbosacral radiculopathy and leg weakness.  If this can help improve her low back pain and improve her leg weakness, the patient would certainly be willing to consider it.  Therefore I will consult Dr. Ethelene Hal at [facility] to evaluate for this.   04/15/19 Patient has since seen neurology.  They have evaluated the patient for neuromuscular diseases.  They have found no evidence of neuromuscular diseases.  Specifically EMG/nerve conduction studies did show length dependent axonal polyneuropathy consistent with peripheral neuropathy.  However there was no evidence of any myositis or muscular disease.  Patient does have some burning neuropathic pain in both feet.  This tends to get worse later in the day.  She would like something that she could take for neuropathy.  However she is also taking tramadol to 3 and sometimes 4 times a day for the pain in her knees and the pain in her back.  She is unable to take NSAIDs due to her chronic kidney disease.  Today on exam, the patient is unable to stand from a seated position despite holding onto her walker and pushing off on the walker.  I would literally have to help the patient stand in a process that takes roughly 15 to 20 seconds with  assistance.  Her proximal muscle strength is extremely weak and her quads and hamstrings.  She recently had an MRI of the brain and cervical spine however she has not received results yet from the neurologist.  She also had extensive lab work to evaluate for neuromuscular diseases which are normal.  Patient would benefit dramatically from a lift chair.  She  lives independently and has an extremely difficult time even getting to a standing position.  She is at high risk for falls due to this.  Therefore I have strongly recommended a lift chair. Past Medical History:  Diagnosis Date  . Cataract   . CKD (chronic kidney disease) stage 3, GFR 30-59 ml/min   . Fatty liver disease, nonalcoholic    2458- admitted with encephalopathy unsure of cause, resolved sponatneously  . Hypertension    Past Surgical History:  Procedure Laterality Date  . FRACTURE SURGERY     right shoulder, both wrists   . JOINT REPLACEMENT     HIP- bilateral   Current Outpatient Medications on File Prior to Visit  Medication Sig Dispense Refill  . ALPRAZolam (XANAX) 0.5 MG tablet TAKE 1 TO 2 TABLETS BY MOUTH EVERY 8 HOURS AS NEEDED FOR ANXIETY 60 tablet 2  . amLODipine (NORVASC) 5 MG tablet Take 1 tablet by mouth once daily 90 tablet 3  . escitalopram (LEXAPRO) 10 MG tablet Take 1 tablet by mouth once daily 90 tablet 3  . traMADol (ULTRAM) 50 MG tablet TAKE 1 TABLET BY MOUTH EVERY 8 HOURS AS NEEDED FOR PAIN 60 tablet 0   Current Facility-Administered Medications on File Prior to Visit  Medication Dose Route Frequency Provider Last Rate Last Admin  . denosumab (PROLIA) injection 60 mg  60 mg Subcutaneous Q6 months Susy Frizzle, MD   60 mg at 01/07/19 1033   Allergies  Allergen Reactions  . Pneumococcal Vaccines Other (See Comments)    Pt had localized reaction to injection of Pneumococcal 23 - Swelling and redness in upper arm that extend to but not beyond elbow   Social History   Socioeconomic History  . Marital  status: Widowed    Spouse name: Not on file  . Number of children: 1  . Years of education: Not on file  . Highest education level: Not on file  Occupational History  . Not on file  Tobacco Use  . Smoking status: Never Smoker  . Smokeless tobacco: Never Used  Substance and Sexual Activity  . Alcohol use: Never  . Drug use: Never  . Sexual activity: Not Currently    Comment: raised tobacco, retired  Other Topics Concern  . Not on file  Social History Narrative   Lives alone   Social Determinants of Health   Financial Resource Strain:   . Difficulty of Paying Living Expenses:   Food Insecurity:   . Worried About Charity fundraiser in the Last Year:   . Arboriculturist in the Last Year:   Transportation Needs:   . Film/video editor (Medical):   Marland Kitchen Lack of Transportation (Non-Medical):   Physical Activity:   . Days of Exercise per Week:   . Minutes of Exercise per Session:   Stress:   . Feeling of Stress :   Social Connections:   . Frequency of Communication with Friends and Family:   . Frequency of Social Gatherings with Friends and Family:   . Attends Religious Services:   . Active Member of Clubs or Organizations:   . Attends Archivist Meetings:   Marland Kitchen Marital Status:   Intimate Partner Violence:   . Fear of Current or Ex-Partner:   . Emotionally Abused:   Marland Kitchen Physically Abused:   . Sexually Abused:      Review of Systems  All other systems reviewed and are negative.      Objective:  Physical Exam Constitutional:      Appearance: She is well-developed.  Cardiovascular:     Rate and Rhythm: Normal rate and regular rhythm.     Heart sounds: Normal heart sounds.  Pulmonary:     Effort: Pulmonary effort is normal. No respiratory distress.     Breath sounds: Normal breath sounds. No stridor. No wheezing or rales.  Musculoskeletal:     Lumbar back: Tenderness and bony tenderness present. Decreased range of motion.       Back:     Right knee:  No effusion. Decreased range of motion. No tenderness. No MCL or LCL tenderness. No LCL laxity or MCL laxity. Normal meniscus.     Left knee: No effusion. Decreased range of motion. No tenderness.       Legs:           Assessment & Plan:  Stage 3 chronic kidney disease, unspecified whether stage 3a or 3b CKD - Plan: BASIC METABOLIC PANEL WITH GFR  Bilateral leg weakness  Spinal stenosis of lumbosacral region  DDD (degenerative disc disease), lumbar  Proximal weakness of extremity  Patient would like to try gabapentin 100 mg p.o. every 8 hours as needed nerve pain in her legs and feet.  I explained to the patient that this medication can cause dizziness sleepiness.  Therefore I want her to use it sparingly and slowly uptitrate on the medication.  Also cautioned the patient not to use this medication with Xanax or to use the medication with tramadol.  She can use them separately but not at the same time.  We can uptitrate the dose of the gabapentin if she tolerates it to 300 mg 3 times a day once she is adjusted to it.  However I would do this slowly to avoid polypharmacy and falls.  I also have strongly recommended a lift chair.  Patient has severe arthritis in her back and also in her knees.  She also has profound muscle weakness in her quadriceps and hamstrings bilaterally.  She is unable to stand from a seated position despite my assistance.  I literally had to pull the patient to a standing position over a period of 15 to 20 seconds.  However once she is standing she is able to ambulate using a walker.  Therefore I believe that she would benefit from a lift seat mechanism to help her get to a standing position from the seated position in her chair.  She is already seen orthopedics and has tried cortisone injections with no relief and has had an extensive work-up under neurology showing no evidence of any underlying muscle issues however she does have peripheral neuropathy.  She has tried  physical therapy as well as medication with minimal benefit.

## 2019-04-22 ENCOUNTER — Telehealth: Payer: Self-pay | Admitting: *Deleted

## 2019-04-22 NOTE — Telephone Encounter (Signed)
Spoke with pt and discussed the MRI c-spine results per Dr. Lucia Gaskins. I let her know I was going to call her son Harvie Heck as well. When pt was asked if she was ok with neurosurgery referral she said whatever her son decides is fine. She verbalized understanding of results and appreciation for the call.   Called Randy (on [place]) and LVM with office hours and number asking for call back.

## 2019-04-22 NOTE — Telephone Encounter (Signed)
-----   Message from Anson Fret, MD sent at 04/17/2019 10:50 AM EST ----- She hs some moderate stenosis on cervical spine, I don;t think this is what is causing her symptoms but I would like her  to see neurosurgery just to make sure. Please discuss with patient and son, thanks.

## 2019-04-23 NOTE — Telephone Encounter (Signed)
Harvie Heck returned call. Please call back when available.

## 2019-04-26 ENCOUNTER — Other Ambulatory Visit: Payer: Self-pay | Admitting: Neurology

## 2019-04-26 DIAGNOSIS — M4802 Spinal stenosis, cervical region: Secondary | ICD-10-CM

## 2019-04-26 NOTE — Telephone Encounter (Signed)
I called the pt's son Harvie Heck (on [place]) and LVM asking for call back. Left office number in message. If pt returns call again, please see if I am available to speak with him, thank you.

## 2019-04-26 NOTE — Telephone Encounter (Signed)
[name] returned my call. We discussed the MRI c-spine results and recommendation per Dr. Lucia Gaskins. He is agreeable to the pt seeing neurosurgery for consultation as he feels they should explore all options. He mentioned that Dr. Ethelene Hal felt pt was not in a position to have surgery however [name] understands this visit with neurosurgery is only a consultation to discuss the options. He verbalized appreciation for the call.

## 2019-04-27 ENCOUNTER — Encounter: Payer: Self-pay | Admitting: *Deleted

## 2019-04-29 ENCOUNTER — Encounter: Payer: Self-pay | Admitting: *Deleted

## 2019-05-04 DIAGNOSIS — M4714 Other spondylosis with myelopathy, thoracic region: Secondary | ICD-10-CM | POA: Diagnosis not present

## 2019-05-04 DIAGNOSIS — Z6827 Body mass index (BMI) 27.0-27.9, adult: Secondary | ICD-10-CM | POA: Insufficient documentation

## 2019-05-04 DIAGNOSIS — I10 Essential (primary) hypertension: Secondary | ICD-10-CM | POA: Diagnosis not present

## 2019-05-20 DIAGNOSIS — M546 Pain in thoracic spine: Secondary | ICD-10-CM | POA: Diagnosis not present

## 2019-05-20 DIAGNOSIS — M4714 Other spondylosis with myelopathy, thoracic region: Secondary | ICD-10-CM | POA: Diagnosis not present

## 2019-06-08 DIAGNOSIS — I10 Essential (primary) hypertension: Secondary | ICD-10-CM | POA: Diagnosis not present

## 2019-06-08 DIAGNOSIS — M4714 Other spondylosis with myelopathy, thoracic region: Secondary | ICD-10-CM | POA: Diagnosis not present

## 2019-06-14 ENCOUNTER — Telehealth: Payer: Self-pay | Admitting: Family Medicine

## 2019-06-14 NOTE — Chronic Care Management (AMB) (Signed)
  Chronic Care Management   Outreach Note  06/14/2019 Name: [name] MRN: [id number] DOB: [birth year]  Referred by: Donita Brooks, MD Reason for referral : Chronic Care Management   An unsuccessful telephone outreach was attempted today. The patient was referred to the pharmacist for assistance with care management and care coordination.   Follow Up Plan:   [name]  Upstream Scheduler

## 2019-06-15 ENCOUNTER — Ambulatory Visit (INDEPENDENT_AMBULATORY_CARE_PROVIDER_SITE_OTHER): Payer: Medicare Other | Admitting: Family Medicine

## 2019-06-15 ENCOUNTER — Other Ambulatory Visit: Payer: Self-pay

## 2019-06-15 ENCOUNTER — Encounter: Payer: Self-pay | Admitting: Family Medicine

## 2019-06-15 VITALS — BP 130/74 | HR 86 | Temp 97.3°F | Resp 18 | Ht 63.0 in | Wt 144.0 lb

## 2019-06-15 DIAGNOSIS — R29898 Other symptoms and signs involving the musculoskeletal system: Secondary | ICD-10-CM | POA: Diagnosis not present

## 2019-06-15 DIAGNOSIS — M6289 Other specified disorders of muscle: Secondary | ICD-10-CM | POA: Diagnosis not present

## 2019-06-15 DIAGNOSIS — G629 Polyneuropathy, unspecified: Secondary | ICD-10-CM | POA: Diagnosis not present

## 2019-06-15 MED ORDER — PREDNISONE 20 MG PO TABS: 20.0000 mg | ORAL_TABLET | Freq: Every day | ORAL | 0 refills | Status: DC

## 2019-06-15 NOTE — Progress Notes (Signed)
Subjective:    Patient ID: [name], female    DOB: 05-28-1931, 86 y.o.   MRN: [id number]  HPI 9/19 Patient presents today complaining of elevated blood pressure.  She states that over the weekend, her blood pressure was as high as 170 systolic over 90 diastolic.  It has been steadily rising over the last few weeks.  She denies any chest pain shortness of breath or dyspnea on exertion.  She became alarmed and therefore this weekend she took 1 of her old blood pressure medications, losartan HCTZ.  Her blood pressure today is well controlled however she has taken her old medication twice over the weekend.  We previously discontinued the medication due to acute kidney injury in the early summer with a rise in her creatinine to 1.73 as well as an elevated potassium of 5.9.  Even after discontinuing losartan HCTZ, her potassium remain borderline elevated at 5.2.  Patient also complains of knee pain and ankle pain.  Early this summer, she tripped over an exposed pipe trading from the wall of her home striking her right shin as she walked by.  She fell on her lateral right ankle as well as her right knee.  Ever since that time, she complains of pain and stiffness in the right knee particularly in the posterior lateral aspect of the right knee as well as swelling behind the knee in the popliteal fossa.  She also complains of pain distal and anterior to the lateral malleolus in the area of the anterior talofibular ligament.  Initially she can barely put weight on her foot however over the last few weeks, the pain has improved and subsided.  She still has pain with ambulation.  She also reports subjective swelling around the right knee.  At that time, my plan was: DC losartan HCTZ due to her history of acute kidney injury as well as hyperkalemia.  Replace with amlodipine 5 mg a day and monitor her blood pressure.  I believe the pain in her right knee is likely due to the fall, osteoarthritis, and possibly  a lateral meniscal tear although the exam is not specific today.  She cannot tolerate NSAIDs due to her mild chronic kidney disease.  Therefore I offered the patient a cortisone injection in the right knee.  She politely declined as the pain is not that severe.  I believe the pain in her right ankle is likely due to a sprain in the anterior talofibular ligament.  This is slowly improving.  I recommended the patient wear an ASO brace with ambulation to provide more ankle support and reassess in 2 to 3 weeks or sooner if worsening.  Patient is bearing weight without any difficulty and therefore I believe the fracture is unlikely.  12/01/18 Patient presents today complaining of problems in both her back and her legs.  She saw my partner in June who obtained a CT scan of the lumbar spine.  The results of the CT scan are included below:  IMPRESSION: Mild inferior endplate compression fracture of T11 and superior endplate compression fracture of T12 appear late subacute to remote. The fractures are most in keeping with senile osteoporotic/posttraumatic injuries.  Calcified right subarticular recess and foraminal protrusion at T11-12 could impact the exiting right T11 and descending right T12 roots.  Scattered foraminal narrowing appears worst at L3-4 where it is moderate to moderately severe and greater on the right.  Patient was referred to orthopedic surgery.  They have recommended bilateral knee replacements due  to osteoarthritis in the knees.  However today the patient's history seems confusing to me.  She denies any knee pain.  She does report audible crepitus in the knee however the majority of the pain is in her lower back.  She also complains of severe weakness in the legs.  Muscle strength is 5/5 equal and symmetric with knee extension and knee flexion as well as ankle dorsiflexion and ankle plantarflexion however hip flexion is extremely weak and is at best 3/5.  Hip extension is also weak  and is at best 4/5.  I am also unable to elicit any reflexes in either knee at the patella.  Patient states that as soon she stands her legs feel extremely weak.  I performed time stand up and go test.  The patient can only push herself to a standing position using her arms.  Her legs wobble and sway the entire time as if she is barely able to support her weight.  I believe that deconditioning has led to severe muscle atrophy in the proximal hip muscles.  Patient reports that she is also falling and feels unsteady on her feet.  She is having to use a walker.  However the majority of the pains in her lower back.  At that time, my plan was: Patient has significant leg weakness.  Some of this could be due to deconditioning however I am concerned about possible neurogenic claudication.  I have recommended physical therapy consultation at home to try to improve the strength in her proximal muscles in her legs to reduce her risk of following.  Meanwhile I will start the patient on a prednisone taper pack for what I suspect a spinal stenosis.  Recheck the patient in 1 week.  If the pain in her lower back is improving I would recommend an MRI of her back at that time to determine if she would benefit from epidural steroid injections and to determine the level where she would benefit the most.  Her exam today suggest more of spinal stenosis with resultant leg weakness.  12/08/18  Patient presents today for follow-up.  She saw some improvement on the prednisone perhaps 25% improvement.  She reports good days and bad days however today is a bad day.  She continues to report severe pain in the center of her back.  She also continues to demonstrate proximal leg muscle weakness.  She has barely able to stand from a seated position and this is only with holding onto a walker.  She is now having to use a walker simply to walk.  She does not feel deconditioning is causing this because she states that she is active every day  cleaning her house and using a leaf blower however she progressively is getting weaker and weaker and is requiring a walker now to walk.  She also reports some numbness and burning in both feet.  Both legs frequently give out on her and she has fallen several times.  I am concerned about neurogenic claudication.  She also continues to have severe pain in both knees secondary to osteoarthritis of the knees however my biggest concern is her progressive muscle weakness in both legs.  At that time, my plan was: Leg weakness seems to be progressively worsening.  I am concerned about neurogenic claudication.  She has yet to hear from physical therapy however she is falling more frequently and is now having to use a walker simply to get out of a chair.  Therefore I will  proceed with an MRI of the lumbar spine to evaluate further.  If there is significant lumbar spinal stenosis, I would recommend a referral to neurosurgery to discuss treatment options or perhaps epidural steroid injections particular to improve her leg weakness.  Patient was also recommended not to take any further NSAIDs as her lab work shows chronic kidney disease and the NSAIDs are likely exacerbating this.  She can use tramadol.  Await the results of the MRI  12/17/18 MRI revealed:  L3-4: 5 mm retrolisthesis with disc and facet degeneration. Diffuse endplate spurring. Severe subarticular and foraminal stenosis on the right. Moderate subarticular and foraminal stenosis on the left. Spinal canal adequate in size  L4-5: Disc degeneration and spurring. Moderate subarticular stenosis on the right due to spurring  L5-S1: Disc degeneration with endplate spurring. Moderate subarticular and foraminal stenosis bilaterally due to spurring.  Patient is here today with her son to discuss the findings.  Her biggest concern is her inability to walk.  She states that she has a difficult time standing from a seated position due to the weakness in her  legs.  When she does stand, her legs feel extremely wobbly and feel like they are going to give way on her.  After standing for a short period of time she has to sit down because of the weakness in her legs particularly the proximal muscles in the quadriceps and hamstrings bilaterally.  She also has significant pain in both knees however her leg weakness and low back pain seem to be her bigger issues.  At that time, my plan was: Patient has severe subarticular stenosis at L3-L4 and moderate subarticular and foraminal stenosis at multiple other levels.  I question if she would benefit from possible epidural steroid injections in the back.  There may be an element of nerve compression due to the foraminal stenosis that could be causing lumbosacral radiculopathy and leg weakness.  If this can help improve her low back pain and improve her leg weakness, the patient would certainly be willing to consider it.  Therefore I will consult Dr. Ethelene Hal at [facility] to evaluate for this.   04/15/19 Patient has since seen neurology.  They have evaluated the patient for neuromuscular diseases.  They have found no evidence of neuromuscular diseases.  Specifically EMG/nerve conduction studies did show length dependent axonal polyneuropathy consistent with peripheral neuropathy.  However there was no evidence of any myositis or muscular disease.  Patient does have some burning neuropathic pain in both feet.  This tends to get worse later in the day.  She would like something that she could take for neuropathy.  However she is also taking tramadol to 3 and sometimes 4 times a day for the pain in her knees and the pain in her back.  She is unable to take NSAIDs due to her chronic kidney disease.  Today on exam, the patient is unable to stand from a seated position despite holding onto her walker and pushing off on the walker.  I would literally have to help the patient stand in a process that takes roughly 15 to 20 seconds with  assistance.  Her proximal muscle strength is extremely weak and her quads and hamstrings.  She recently had an MRI of the brain and cervical spine however she has not received results yet from the neurologist.  She also had extensive lab work to evaluate for neuromuscular diseases which are normal.  Patient would benefit dramatically from a lift chair.  She  lives independently and has an extremely difficult time even getting to a standing position.  She is at high risk for falls due to this.  Therefore I have strongly recommended a lift chair.  At that time, my plan was: Patient would like to try gabapentin 100 mg p.o. every 8 hours as needed nerve pain in her legs and feet.  I explained to the patient that this medication can cause dizziness sleepiness.  Therefore I want her to use it sparingly and slowly uptitrate on the medication.  Also cautioned the patient not to use this medication with Xanax or to use the medication with tramadol.  She can use them separately but not at the same time.  We can uptitrate the dose of the gabapentin if she tolerates it to 300 mg 3 times a day once she is adjusted to it.  However I would do this slowly to avoid polypharmacy and falls.  I also have strongly recommended a lift chair.  Patient has severe arthritis in her back and also in her knees.  She also has profound muscle weakness in her quadriceps and hamstrings bilaterally.  She is unable to stand from a seated position despite my assistance.  I literally had to pull the patient to a standing position over a period of 15 to 20 seconds.  However once she is standing she is able to ambulate using a walker.  Therefore I believe that she would benefit from a lift seat mechanism to help her get to a standing position from the seated position in her chair.  She is already seen orthopedics and has tried cortisone injections with no relief and has had an extensive work-up under neurology showing no evidence of any underlying muscle  issues however she does have peripheral neuropathy.  She has tried physical therapy as well as medication with minimal benefit.  06/15/19 Since I last saw the patient, she has seen neurology.  Neurologist performed a very thorough evaluation for underlying muscle disease such as myositis, PMR, myasthenia gravis.  She also performed evaluation including nerve conduction studies with EMGs that were normal.  MRI of the lumbar spine shows no significant spinal stenosis.  MRI of the cervical spine shows moderate cervical spinal stenosis however she is seen a neurosurgeon who did not recommend surgery as he did not feel the patient is impacting her legs.  She also reports that she has had an MRI of the thoracic spine at the neurosurgeons office that showed no significant spinal stenosis that would cause underlying leg weakness.  She states the majority of her pain is in her knees.  However she also reports pain in her knees down to her toes.  She states that her legs ache and crawl.  As long as she is sitting still, she has no pain however as soon as she stands or tries to fall she developed severe pain in her knees lower legs and in her feet.  She also has diffuse leg weakness specifically in her hip flexors and hip extensors.  She is unable to stand from a chair without using a walker to push herself to a standing position using her arms.  This requires great effort in case more than 10 seconds for her to stand up.  She states that she is miserable.  She seems very frustrated.  She states that no one is giving her any explanation for what is going on.  She states that she is tired of bouncing from doctor to doctor  without any explanation. Past Medical History:  Diagnosis Date  . Cataract   . CKD (chronic kidney disease) stage 3, GFR 30-59 ml/min   . Fatty liver disease, nonalcoholic    8250- admitted with encephalopathy unsure of cause, resolved sponatneously  . Hypertension    Past Surgical History:    Procedure Laterality Date  . FRACTURE SURGERY     right shoulder, both wrists   . JOINT REPLACEMENT     HIP- bilateral   Current Outpatient Medications on File Prior to Visit  Medication Sig Dispense Refill  . ALPRAZolam (XANAX) 0.5 MG tablet TAKE 1 TO 2 TABLETS BY MOUTH EVERY 8 HOURS AS NEEDED FOR ANXIETY 60 tablet 2  . amLODipine (NORVASC) 5 MG tablet Take 1 tablet by mouth once daily 90 tablet 3  . escitalopram (LEXAPRO) 10 MG tablet Take 1 tablet by mouth once daily 90 tablet 3  . gabapentin (NEURONTIN) 100 MG capsule Take 1 capsule (100 mg total) by mouth 3 (three) times daily as needed. Can be taken as needed for nerve pain 90 capsule 3  . traMADol (ULTRAM) 50 MG tablet TAKE 1 TABLET BY MOUTH EVERY 8 HOURS AS NEEDED FOR PAIN 60 tablet 0   Current Facility-Administered Medications on File Prior to Visit  Medication Dose Route Frequency Provider Last Rate Last Admin  . denosumab (PROLIA) injection 60 mg  60 mg Subcutaneous Q6 months Susy Frizzle, MD   60 mg at 01/07/19 1033   Allergies  Allergen Reactions  . Pneumococcal Vaccines Other (See Comments)    Pt had localized reaction to injection of Pneumococcal 23 - Swelling and redness in upper arm that extend to but not beyond elbow   Social History   Socioeconomic History  . Marital status: Widowed    Spouse name: Not on file  . Number of children: 1  . Years of education: Not on file  . Highest education level: Not on file  Occupational History  . Not on file  Tobacco Use  . Smoking status: Never Smoker  . Smokeless tobacco: Never Used  Substance and Sexual Activity  . Alcohol use: Never  . Drug use: Never  . Sexual activity: Not Currently    Comment: raised tobacco, retired  Other Topics Concern  . Not on file  Social History Narrative   Lives alone   Social Determinants of Health   Financial Resource Strain:   . Difficulty of Paying Living Expenses:   Food Insecurity:   . Worried About Sales executive in the Last Year:   . Arboriculturist in the Last Year:   Transportation Needs:   . Film/video editor (Medical):   Marland Kitchen Lack of Transportation (Non-Medical):   Physical Activity:   . Days of Exercise per Week:   . Minutes of Exercise per Session:   Stress:   . Feeling of Stress :   Social Connections:   . Frequency of Communication with Friends and Family:   . Frequency of Social Gatherings with Friends and Family:   . Attends Religious Services:   . Active Member of Clubs or Organizations:   . Attends Archivist Meetings:   Marland Kitchen Marital Status:   Intimate Partner Violence:   . Fear of Current or Ex-Partner:   . Emotionally Abused:   Marland Kitchen Physically Abused:   . Sexually Abused:      Review of Systems  All other systems reviewed and are negative.  Objective:   Physical Exam Constitutional:      Appearance: She is well-developed.  Cardiovascular:     Rate and Rhythm: Normal rate and regular rhythm.     Heart sounds: Normal heart sounds.  Pulmonary:     Effort: Pulmonary effort is normal. No respiratory distress.     Breath sounds: Normal breath sounds. No stridor. No wheezing or rales.  Musculoskeletal:     Lumbar back: Tenderness and bony tenderness present. Decreased range of motion.       Back:     Right knee: No effusion. Decreased range of motion. No tenderness. No MCL or LCL tenderness. No LCL laxity or MCL laxity. Normal meniscus.     Left knee: No effusion. Decreased range of motion. No tenderness.       Legs:           Assessment & Plan:  Bilateral leg weakness  Proximal weakness of extremity  Polyneuropathy  Patient is very frustrated.  I have reviewed lab work from neurology.  I appreciate all of their help.  A very thorough work-up revealed no specific pathologic issue other than polyneuropathy.  Patient has not been taking gabapentin 100 mg 3 times a day.  She has not been taking it all.  I would like her to add gabapentin 100 mg  3 times a day as I believe some of the pain distal to her knees in her calves and in her feet is likely neuropathic pain.  Her pain is out of proportion to her diagnostic work-up today and therefore I believe this must be neuropathic in nature.  However I also believe it is possible that she may be demonstrating PMR given her proximal muscle weakness.  Even though her sed rate was normal at the neurologist office I would like to try the patient on prednisone 20 mg a day empirically for possible PMR.  I would like to see the patient back in 2 weeks to see if the combination of prednisone and gabapentin has provided her any relief.  Recheck sooner if worse.

## 2019-06-16 ENCOUNTER — Other Ambulatory Visit: Payer: Self-pay | Admitting: Family Medicine

## 2019-06-16 DIAGNOSIS — F5101 Primary insomnia: Secondary | ICD-10-CM

## 2019-06-17 NOTE — Telephone Encounter (Signed)
Ok to refill??  Last office visit 06/15/2019.  Last refill 03/11/2019, #2 refills.

## 2019-06-22 NOTE — Progress Notes (Signed)
[name]    Provider:  Dr [name] Requesting Provider: Suella Broad, MD Primary Care Provider:  Susy Frizzle, MD  CC:  Weakness  Interval history: 86 y.o. female here as requested by [name] for monoplegia right leg.PMHx HTN, fatty liver (NASH), CKD, degenerative lumbar disk disease, pain right ankle and knee, total hip arthroplasty, osteroarthritis left knee, weakness right leg.  She has had 6 months of progressive gait difficulty. Blood work unremarkable and emg needle muscle study surprisingly unremarkable without acute/ongoing denervation or chronic neurogenic changes, it did NOT show myopathy or myositis. MRI of the brain did not show strokes or any other etiology. Mri of the cervical spine showed moderate stenosis with possibly some signal in the cord but evaluation with neurosurgery agreed that this is not likely the cause. Chronic pain and disuse may be the reason. No double vision, all labs hav been negative. She is on steroids and she feels much better. Here with her son. Taking the steroids after a day is almost immediate.  HPI:  [name] is a 86 y.o. female here as requested by [name] for monoplegia right leg.PMHx HTN, fatty liver (NASH), CKD, degenerative lumbar disk disease, pain right ankle and knee, total hip arthroplasty, osteroarthritis left knee, weakness right leg.  She is here with her son and he provides much information. She can only walk 10 feet she has to sit done. Started 6 months ago. Sitting is fine. Only walking. Started 6 months ago, last December 2019. She has been having chronic back pain for many years, in Dec 2019 she had steroids and she felt better - March 2019 but when she finished started having back pain again and worsening weakness. March walking well unassisted. When steroids completed she started having low back pain bilaterally but no shooting pains intot he legs Slowly progresive weakness and worsening since  June now using a walker. The right leg has always been difficult lifting since surgery but the left leg was always fine. Both legs getting weak. She gets exhausted, No shortness of breath, Not noticing the arms being more affected. No speech problems, no focal symptoms to think she had a stroke. She has numbness in the feet and feels her feet have socks on them and she has pain in the feet but again not associated. No muscle wasting or weight loss. Some tremor in the right hand with action. Son provides much information, he says later this summer she started declining. She has multi-joint arthritic changes, swelling in the distal feet, knee pain and needs replacement.   Reviewed notes, labs and imaging from outside physicians, which showed:  MRI lumbar spine personally reviewed images and agree with the following: Multilevel disc and facet degeneration throughout the lumbar spine. Multilevel subarticular and foraminal stenosis. No significant central canal stenosis.  Review of Systems: Patient complains of symptoms per HPI as well as the following symptoms: HTN, depression, weakness, decreased energy, disinterest in activities, joint pain, joint swelling, hearing loss, fatigue, swelling in legs. Pertinent negatives and positives per HPI. All others negative.   Social History   Socioeconomic History  . Marital status: Widowed    Spouse name: Not on file  . Number of children: 1  . Years of education: Not on file  . Highest education level: Not on file  Occupational History  . Not on file  Tobacco Use  . Smoking status: Never Smoker  . Smokeless tobacco: Never Used  Substance and Sexual Activity  .  Alcohol use: Not Currently  . Drug use: Never  . Sexual activity: Not Currently    Comment: raised tobacco, retired  Other Topics Concern  . Not on file  Social History Narrative   Lives alone   Right handed    Caffeine: 1/2-1 cup coffee in AM   Social Determinants of Health    Financial Resource Strain:   . Difficulty of Paying Living Expenses:   Food Insecurity:   . Worried About Programme researcher, broadcasting/film/video in the Last Year:   . Barista in the Last Year:   Transportation Needs:   . Freight forwarder (Medical):   Marland Kitchen Lack of Transportation (Non-Medical):   Physical Activity:   . Days of Exercise per Week:   . Minutes of Exercise per Session:   Stress:   . Feeling of Stress :   Social Connections:   . Frequency of Communication with Friends and Family:   . Frequency of Social Gatherings with Friends and Family:   . Attends Religious Services:   . Active Member of Clubs or Organizations:   . Attends Banker Meetings:   Marland Kitchen Marital Status:   Intimate Partner Violence:   . Fear of Current or Ex-Partner:   . Emotionally Abused:   Marland Kitchen Physically Abused:   . Sexually Abused:     Family History  Problem Relation Age of Onset  . Heart disease Mother   . Cancer Father        metastatic unsure of primary  . Early death Brother        died at 63 y/o  . Liver cancer Maternal Grandmother     Past Medical History:  Diagnosis Date  . Cataract   . CKD (chronic kidney disease) stage 3, GFR 30-59 ml/min   . Fatty liver disease, nonalcoholic    2007- admitted with encephalopathy unsure of cause, resolved sponatneously  . Hypertension     Patient Active Problem List   Diagnosis Date Noted  . Proximal weakness of extremity 02/25/2019  . CKD (chronic kidney disease) stage 3, GFR 30-59 ml/min   . Unilateral primary osteoarthritis, left knee 09/16/2018  . Thoracic compression fracture, sequela 09/16/2018  . Hx of total hip arthroplasty, bilateral 09/16/2018  . Bilateral primary osteoarthritis of knee 08/04/2018  . Essential hypertension 07/08/2018  . Osteopenia 09/13/2014  . Fatty liver disease, nonalcoholic     Past Surgical History:  Procedure Laterality Date  . FRACTURE SURGERY     right shoulder, both wrists   . JOINT REPLACEMENT      HIP- bilateral    Current Outpatient Medications  Medication Sig Dispense Refill  . ALPRAZolam (XANAX) 0.5 MG tablet TAKE 1 TO 2 TABLETS BY MOUTH EVERY 8 HOURS AS NEEDED FOR ANXIETY 60 tablet 2  . amLODipine (NORVASC) 5 MG tablet Take 1 tablet by mouth once daily 90 tablet 3  . escitalopram (LEXAPRO) 10 MG tablet Take 1 tablet by mouth once daily 90 tablet 3  . gabapentin (NEURONTIN) 100 MG capsule Take 1 capsule (100 mg total) by mouth 3 (three) times daily as needed. Can be taken as needed for nerve pain 90 capsule 3  . predniSONE (DELTASONE) 20 MG tablet Take 1 tablet (20 mg total) by mouth daily with breakfast. 30 tablet 0  . traMADol (ULTRAM) 50 MG tablet TAKE 1 TABLET BY MOUTH EVERY 8 HOURS AS NEEDED FOR PAIN 60 tablet 0   Current Facility-Administered Medications  Medication Dose Route Frequency Provider  Last Rate Last Admin  . denosumab (PROLIA) injection 60 mg  60 mg Subcutaneous Q6 months Donita Brooks, MD   60 mg at 01/07/19 1033    Allergies as of 06/23/2019 - Review Complete 06/23/2019  Allergen Reaction Noted  . Pneumococcal vaccines Other (See Comments) 07/14/2014    Vitals: BP (!) 146/72 (BP Location: Right Arm, Patient Position: Sitting)   Pulse 65   Ht 5' 2.5" (1.588 m)   Wt 149 lb (67.6 kg)   BMI 26.82 kg/m  Last Weight:  Wt Readings from Last 1 Encounters:  06/23/19 149 lb (67.6 kg)   Last Height:   Ht Readings from Last 1 Encounters:  06/23/19 5' 2.5" (1.588 m)     Physical exam: Exam: Gen: NAD, conversant                     CV: RRR, no MRG. No Carotid Bruits. No peripheral edema, warm, nontender Eyes: Conjunctivae clear without exudates or hemorrhage  Neuro: Detailed Neurologic Exam  Speech:    Speech is normal; fluent and spontaneous with normal comprehension.  Cognition:    The patient is oriented to person, place, and time;     recent and remote memory intact;     language fluent;     normal attention, concentration,     fund of  knowledge Cranial Nerves:    The pupils are equal, round, and reactive to light. Attempted fundoscopy could not visualize due to small pupils. Visual fields are full to finger confrontation. Extraocular movements are intact. Trigeminal sensation is intact and the muscles of mastication are normal. The face is symmetric. The palate elevates in the midline. Hearing intact. Voice is normal. Shoulder shrug is normal. The tongue has normal motion without fasciculations.   Coordination:    Normal finger to nose  Gait:    Improved getting up out of chair, narrow step, good strides and low clearance  Motor Observation:    No asymmetry, no atrophy, and no involuntary movements noted. Tone:    Normal muscle tone.    Posture:    Posture is normal. normal erect    Strength: left arm intact, right arm could not test before due to arthritis in the shoulder but possible some mild prox weakness but pain significantly improved, right hip flexion 3+/5, left hip flexion 4/5. Strength distally is normal.       Sensation: intact to LT     Reflex Exam:  DTR's:    Deep tendon reflexes in the upper extremities are brisk bilaterally and absent in the lowers.   Toes:    The toes are equivocal bilaterally.   Clonus:    Clonus is absent.    Assessment/Plan:  86 y.o. female here as requested by Sheran Luz for monoplegia right leg.PMHx HTN, fatty liver (NASH), CKD, degenerative lumbar disk disease, pain right ankle and knee, total hip arthroplasty, osteroarthritis left knee, weakness right leg.  She has had 6 months of progressive gait difficulty. She has proximal weakness that is out of proportion to her lumbar spine imaging that does not show any significant canal stenosis, she has Multilevel subarticular and foraminal stenosis and low back pain but I do not think this is causing her weakness as she reports no radicular symptoms.Cannot rule out progressive atrophy due to disuse and chronic pain (she reports  severe knee pain, shoulder pain, arthritis, her complaints are significantly based on joint pain), we will perform bloodwork today and emg/ncs. I do  not think this is a stroke or vascular gait apraxia. Likely chronic pain, chronic joint pain and disuse, cannot ascertain any other reason.    She is on steroids again, the result is almost immediate she feels better even after a day or two after the steroid; myasthenia gravis labs and all labs were negative and presentation doesn't fit with MG, emg/ncs did not show any muscle disease. She is on steroids now and feels great, this may have been more chronic joint pan than anything, we discussed maybe Dr. Tanya Nones can titrate to a lower dose and this may be a solution since no other reason found and no neurologic causes, neurosurgery ruled out cervical myelopathy. Consider long-term steroids at the lowest dose possible for quality of life, I know steroids have their long term effects but she is 80 and at this point her quality of life is so drastically improved on steroids I would consider staying on them with close monitoring by Dr. Tanya Nones.  - We discussed PT again now that she can do more and feels much better she may get more out of it amubulatory (in home not as good in my opinion)- work on balance issues as well and gait.Strength is quite good.   Addendum: Blood work unremarkable and emg needle muscle study surprisingly unremarkable without acute/ongoing denervation or chronic neurogenic changes, it did NOT show myopathy or myositis. MRI of the brain did not show strokes or any other etiology. Mri of the cervical spine showed moderate stenosis with possibly some signal in the cord. I don;t think this is what is causing her symptoms but son and patient would like to be evaluated by neurosurgery, we can do this to get neurosurgery's opinion on myelopathy but again I think chronic pain and disuse may be the cause of her weakness/symptoms will see what neurosurgery  also says about her c-spine.   Orders Placed This Encounter  Procedures  . Ambulatory referral to Physical Therapy    Cc: Donita Brooks, MD,  Sheran Luz MD  Naomie Dean, MD  [facility] [address] [place], [place] [address]  Phone [phone] Fax [phone]  A total of 30 minutes was spent on this patient's care, reviewing imaging, past records, recent hospitalization notes and results. Over half this time was spent on counseling patient on the  1. Gait abnormality   2. Imbalance    diagnosis and different diagnostic and therapeutic options, counseling and coordination of care, risks and benefitsof management, compliance, or risk factor reduction and education.

## 2019-06-23 ENCOUNTER — Ambulatory Visit (INDEPENDENT_AMBULATORY_CARE_PROVIDER_SITE_OTHER): Payer: Medicare Other | Admitting: Neurology

## 2019-06-23 ENCOUNTER — Encounter: Payer: Self-pay | Admitting: Neurology

## 2019-06-23 ENCOUNTER — Other Ambulatory Visit: Payer: Self-pay

## 2019-06-23 VITALS — BP 146/72 | HR 65 | Ht 62.5 in | Wt 149.0 lb

## 2019-06-23 DIAGNOSIS — R269 Unspecified abnormalities of gait and mobility: Secondary | ICD-10-CM

## 2019-06-23 DIAGNOSIS — R2689 Other abnormalities of gait and mobility: Secondary | ICD-10-CM

## 2019-07-01 ENCOUNTER — Other Ambulatory Visit: Payer: Self-pay

## 2019-07-01 ENCOUNTER — Ambulatory Visit (INDEPENDENT_AMBULATORY_CARE_PROVIDER_SITE_OTHER): Payer: Medicare Other | Admitting: Family Medicine

## 2019-07-01 VITALS — BP 140/60 | HR 73 | Temp 97.6°F | Ht 62.5 in | Wt 149.0 lb

## 2019-07-01 DIAGNOSIS — R29898 Other symptoms and signs involving the musculoskeletal system: Secondary | ICD-10-CM

## 2019-07-01 DIAGNOSIS — M6289 Other specified disorders of muscle: Secondary | ICD-10-CM | POA: Diagnosis not present

## 2019-07-01 DIAGNOSIS — M170 Bilateral primary osteoarthritis of knee: Secondary | ICD-10-CM

## 2019-07-01 DIAGNOSIS — G629 Polyneuropathy, unspecified: Secondary | ICD-10-CM

## 2019-07-01 MED ORDER — PREDNISONE 10 MG PO TABS: 20.0000 mg | ORAL_TABLET | Freq: Every day | ORAL | 5 refills | Status: DC

## 2019-07-01 NOTE — Progress Notes (Signed)
Subjective:    Patient ID: [name], female    DOB: 05-28-1931, 86 y.o.   MRN: [id number]  HPI 9/19 Patient presents today complaining of elevated blood pressure.  She states that over the weekend, her blood pressure was as high as 170 systolic over 90 diastolic.  It has been steadily rising over the last few weeks.  She denies any chest pain shortness of breath or dyspnea on exertion.  She became alarmed and therefore this weekend she took 1 of her old blood pressure medications, losartan HCTZ.  Her blood pressure today is well controlled however she has taken her old medication twice over the weekend.  We previously discontinued the medication due to acute kidney injury in the early summer with a rise in her creatinine to 1.73 as well as an elevated potassium of 5.9.  Even after discontinuing losartan HCTZ, her potassium remain borderline elevated at 5.2.  Patient also complains of knee pain and ankle pain.  Early this summer, she tripped over an exposed pipe trading from the wall of her home striking her right shin as she walked by.  She fell on her lateral right ankle as well as her right knee.  Ever since that time, she complains of pain and stiffness in the right knee particularly in the posterior lateral aspect of the right knee as well as swelling behind the knee in the popliteal fossa.  She also complains of pain distal and anterior to the lateral malleolus in the area of the anterior talofibular ligament.  Initially she can barely put weight on her foot however over the last few weeks, the pain has improved and subsided.  She still has pain with ambulation.  She also reports subjective swelling around the right knee.  At that time, my plan was: DC losartan HCTZ due to her history of acute kidney injury as well as hyperkalemia.  Replace with amlodipine 5 mg a day and monitor her blood pressure.  I believe the pain in her right knee is likely due to the fall, osteoarthritis, and possibly  a lateral meniscal tear although the exam is not specific today.  She cannot tolerate NSAIDs due to her mild chronic kidney disease.  Therefore I offered the patient a cortisone injection in the right knee.  She politely declined as the pain is not that severe.  I believe the pain in her right ankle is likely due to a sprain in the anterior talofibular ligament.  This is slowly improving.  I recommended the patient wear an ASO brace with ambulation to provide more ankle support and reassess in 2 to 3 weeks or sooner if worsening.  Patient is bearing weight without any difficulty and therefore I believe the fracture is unlikely.  12/01/18 Patient presents today complaining of problems in both her back and her legs.  She saw my partner in June who obtained a CT scan of the lumbar spine.  The results of the CT scan are included below:  IMPRESSION: Mild inferior endplate compression fracture of T11 and superior endplate compression fracture of T12 appear late subacute to remote. The fractures are most in keeping with senile osteoporotic/posttraumatic injuries.  Calcified right subarticular recess and foraminal protrusion at T11-12 could impact the exiting right T11 and descending right T12 roots.  Scattered foraminal narrowing appears worst at L3-4 where it is moderate to moderately severe and greater on the right.  Patient was referred to orthopedic surgery.  They have recommended bilateral knee replacements due  to osteoarthritis in the knees.  However today the patient's history seems confusing to me.  She denies any knee pain.  She does report audible crepitus in the knee however the majority of the pain is in her lower back.  She also complains of severe weakness in the legs.  Muscle strength is 5/5 equal and symmetric with knee extension and knee flexion as well as ankle dorsiflexion and ankle plantarflexion however hip flexion is extremely weak and is at best 3/5.  Hip extension is also weak  and is at best 4/5.  I am also unable to elicit any reflexes in either knee at the patella.  Patient states that as soon she stands her legs feel extremely weak.  I performed time stand up and go test.  The patient can only push herself to a standing position using her arms.  Her legs wobble and sway the entire time as if she is barely able to support her weight.  I believe that deconditioning has led to severe muscle atrophy in the proximal hip muscles.  Patient reports that she is also falling and feels unsteady on her feet.  She is having to use a walker.  However the majority of the pains in her lower back.  At that time, my plan was: Patient has significant leg weakness.  Some of this could be due to deconditioning however I am concerned about possible neurogenic claudication.  I have recommended physical therapy consultation at home to try to improve the strength in her proximal muscles in her legs to reduce her risk of following.  Meanwhile I will start the patient on a prednisone taper pack for what I suspect a spinal stenosis.  Recheck the patient in 1 week.  If the pain in her lower back is improving I would recommend an MRI of her back at that time to determine if she would benefit from epidural steroid injections and to determine the level where she would benefit the most.  Her exam today suggest more of spinal stenosis with resultant leg weakness.  12/08/18  Patient presents today for follow-up.  She saw some improvement on the prednisone perhaps 25% improvement.  She reports good days and bad days however today is a bad day.  She continues to report severe pain in the center of her back.  She also continues to demonstrate proximal leg muscle weakness.  She has barely able to stand from a seated position and this is only with holding onto a walker.  She is now having to use a walker simply to walk.  She does not feel deconditioning is causing this because she states that she is active every day  cleaning her house and using a leaf blower however she progressively is getting weaker and weaker and is requiring a walker now to walk.  She also reports some numbness and burning in both feet.  Both legs frequently give out on her and she has fallen several times.  I am concerned about neurogenic claudication.  She also continues to have severe pain in both knees secondary to osteoarthritis of the knees however my biggest concern is her progressive muscle weakness in both legs.  At that time, my plan was: Leg weakness seems to be progressively worsening.  I am concerned about neurogenic claudication.  She has yet to hear from physical therapy however she is falling more frequently and is now having to use a walker simply to get out of a chair.  Therefore I will  proceed with an MRI of the lumbar spine to evaluate further.  If there is significant lumbar spinal stenosis, I would recommend a referral to neurosurgery to discuss treatment options or perhaps epidural steroid injections particular to improve her leg weakness.  Patient was also recommended not to take any further NSAIDs as her lab work shows chronic kidney disease and the NSAIDs are likely exacerbating this.  She can use tramadol.  Await the results of the MRI  12/17/18 MRI revealed:  L3-4: 5 mm retrolisthesis with disc and facet degeneration. Diffuse endplate spurring. Severe subarticular and foraminal stenosis on the right. Moderate subarticular and foraminal stenosis on the left. Spinal canal adequate in size  L4-5: Disc degeneration and spurring. Moderate subarticular stenosis on the right due to spurring  L5-S1: Disc degeneration with endplate spurring. Moderate subarticular and foraminal stenosis bilaterally due to spurring.  Patient is here today with her son to discuss the findings.  Her biggest concern is her inability to walk.  She states that she has a difficult time standing from a seated position due to the weakness in her  legs.  When she does stand, her legs feel extremely wobbly and feel like they are going to give way on her.  After standing for a short period of time she has to sit down because of the weakness in her legs particularly the proximal muscles in the quadriceps and hamstrings bilaterally.  She also has significant pain in both knees however her leg weakness and low back pain seem to be her bigger issues.  At that time, my plan was: Patient has severe subarticular stenosis at L3-L4 and moderate subarticular and foraminal stenosis at multiple other levels.  I question if she would benefit from possible epidural steroid injections in the back.  There may be an element of nerve compression due to the foraminal stenosis that could be causing lumbosacral radiculopathy and leg weakness.  If this can help improve her low back pain and improve her leg weakness, the patient would certainly be willing to consider it.  Therefore I will consult Dr. Ethelene Hal at [facility] to evaluate for this.   04/15/19 Patient has since seen neurology.  They have evaluated the patient for neuromuscular diseases.  They have found no evidence of neuromuscular diseases.  Specifically EMG/nerve conduction studies did show length dependent axonal polyneuropathy consistent with peripheral neuropathy.  However there was no evidence of any myositis or muscular disease.  Patient does have some burning neuropathic pain in both feet.  This tends to get worse later in the day.  She would like something that she could take for neuropathy.  However she is also taking tramadol to 3 and sometimes 4 times a day for the pain in her knees and the pain in her back.  She is unable to take NSAIDs due to her chronic kidney disease.  Today on exam, the patient is unable to stand from a seated position despite holding onto her walker and pushing off on the walker.  I would literally have to help the patient stand in a process that takes roughly 15 to 20 seconds with  assistance.  Her proximal muscle strength is extremely weak and her quads and hamstrings.  She recently had an MRI of the brain and cervical spine however she has not received results yet from the neurologist.  She also had extensive lab work to evaluate for neuromuscular diseases which are normal.  Patient would benefit dramatically from a lift chair.  She  lives independently and has an extremely difficult time even getting to a standing position.  She is at high risk for falls due to this.  Therefore I have strongly recommended a lift chair.  At that time, my plan was: Patient would like to try gabapentin 100 mg p.o. every 8 hours as needed nerve pain in her legs and feet.  I explained to the patient that this medication can cause dizziness sleepiness.  Therefore I want her to use it sparingly and slowly uptitrate on the medication.  Also cautioned the patient not to use this medication with Xanax or to use the medication with tramadol.  She can use them separately but not at the same time.  We can uptitrate the dose of the gabapentin if she tolerates it to 300 mg 3 times a day once she is adjusted to it.  However I would do this slowly to avoid polypharmacy and falls.  I also have strongly recommended a lift chair.  Patient has severe arthritis in her back and also in her knees.  She also has profound muscle weakness in her quadriceps and hamstrings bilaterally.  She is unable to stand from a seated position despite my assistance.  I literally had to pull the patient to a standing position over a period of 15 to 20 seconds.  However once she is standing she is able to ambulate using a walker.  Therefore I believe that she would benefit from a lift seat mechanism to help her get to a standing position from the seated position in her chair.  She is already seen orthopedics and has tried cortisone injections with no relief and has had an extensive work-up under neurology showing no evidence of any underlying muscle  issues however she does have peripheral neuropathy.  She has tried physical therapy as well as medication with minimal benefit.  06/15/19 Since I last saw the patient, she has seen neurology.  Neurologist performed a very thorough evaluation for underlying muscle disease such as myositis, PMR, myasthenia gravis.  She also performed evaluation including nerve conduction studies with EMGs that were normal.  MRI of the lumbar spine shows no significant spinal stenosis.  MRI of the cervical spine shows moderate cervical spinal stenosis however she is seen a neurosurgeon who did not recommend surgery as he did not feel the patient is impacting her legs.  She also reports that she has had an MRI of the thoracic spine at the neurosurgeons office that showed no significant spinal stenosis that would cause underlying leg weakness.  She states the majority of her pain is in her knees.  However she also reports pain in her knees down to her toes.  She states that her legs ache and crawl.  As long as she is sitting still, she has no pain however as soon as she stands or tries to fall she developed severe pain in her knees lower legs and in her feet.  She also has diffuse leg weakness specifically in her hip flexors and hip extensors.  She is unable to stand from a chair without using a walker to push herself to a standing position using her arms.  This requires great effort in case more than 10 seconds for her to stand up.  She states that she is miserable.  She seems very frustrated.  She states that no one is giving her any explanation for what is going on.  She states that she is tired of bouncing from doctor to doctor  without any explanation.  At that time, my plan was: Patient is very frustrated.  I have reviewed lab work from neurology.  I appreciate all of their help.  A very thorough work-up revealed no specific pathologic issue other than polyneuropathy.  Patient has not been taking gabapentin 100 mg 3 times a day.   She has not been taking it all.  I would like her to add gabapentin 100 mg 3 times a day as I believe some of the pain distal to her knees in her calves and in her feet is likely neuropathic pain.  Her pain is out of proportion to her diagnostic work-up today and therefore I believe this must be neuropathic in nature.  However I also believe it is possible that she may be demonstrating PMR given her proximal muscle weakness.  Even though her sed rate was normal at the neurologist office I would like to try the patient on prednisone 20 mg a day empirically for possible PMR.  I would like to see the patient back in 2 weeks to see if the combination of prednisone and gabapentin has provided her any relief.  Recheck sooner if worse.  07/01/19 Patient states that she is almost 100% better!  She still has a difficult time standing from a seated position however she is able to do this today on first attempt with only mild effort whereas before she could barely stand despite holding onto the chair and a walker.  She still has proximal muscle weakness however the rapid improvement on prednisone makes me suspect involvement of PMR.  Also believe she likely has arthritis and the prednisone was treating this.  We had to avoid NSAIDs due to chronic kidney disease.  Whether his arthritis or PMR, the patient has dramatically responded to prednisone 20 mg a day.  We spent the remainder of our visit today discussing the risk of the prednisone.  I explained that it will make osteoporosis worse.  A week of her immune system.  I will raise her blood sugar.  Will cause weight gain.  However the patient states that she would rather take this pill and die a year or sooner for continued pain that she was experiencing before.  I would like to try to wean her to the lowest effective dose. Past Medical History:  Diagnosis Date  . Cataract   . CKD (chronic kidney disease) stage 3, GFR 30-59 ml/min   . Fatty liver disease, nonalcoholic     2007- admitted with encephalopathy unsure of cause, resolved sponatneously  . Hypertension    Past Surgical History:  Procedure Laterality Date  . FRACTURE SURGERY     right shoulder, both wrists   . JOINT REPLACEMENT     HIP- bilateral   Current Outpatient Medications on File Prior to Visit  Medication Sig Dispense Refill  . ALPRAZolam (XANAX) 0.5 MG tablet TAKE 1 TO 2 TABLETS BY MOUTH EVERY 8 HOURS AS NEEDED FOR ANXIETY 60 tablet 2  . amLODipine (NORVASC) 5 MG tablet Take 1 tablet by mouth once daily 90 tablet 3  . escitalopram (LEXAPRO) 10 MG tablet Take 1 tablet by mouth once daily 90 tablet 3  . gabapentin (NEURONTIN) 100 MG capsule Take 1 capsule (100 mg total) by mouth 3 (three) times daily as needed. Can be taken as needed for nerve pain 90 capsule 3  . predniSONE (DELTASONE) 20 MG tablet Take 1 tablet (20 mg total) by mouth daily with breakfast. 30 tablet 0  .  traMADol (ULTRAM) 50 MG tablet TAKE 1 TABLET BY MOUTH EVERY 8 HOURS AS NEEDED FOR PAIN 60 tablet 0   Current Facility-Administered Medications on File Prior to Visit  Medication Dose Route Frequency Provider Last Rate Last Admin  . denosumab (PROLIA) injection 60 mg  60 mg Subcutaneous Q6 months Donita Brooks, MD   60 mg at 01/07/19 1033   Allergies  Allergen Reactions  . Pneumococcal Vaccines Other (See Comments)    Pt had localized reaction to injection of Pneumococcal 23 - Swelling and redness in upper arm that extend to but not beyond elbow   Social History   Socioeconomic History  . Marital status: Widowed    Spouse name: Not on file  . Number of children: 1  . Years of education: Not on file  . Highest education level: Not on file  Occupational History  . Not on file  Tobacco Use  . Smoking status: Never Smoker  . Smokeless tobacco: Never Used  Substance and Sexual Activity  . Alcohol use: Not Currently  . Drug use: Never  . Sexual activity: Not Currently    Comment: raised tobacco, retired   Other Topics Concern  . Not on file  Social History Narrative   Lives alone   Right handed    Caffeine: 1/2-1 cup coffee in AM   Social Determinants of Health   Financial Resource Strain:   . Difficulty of Paying Living Expenses:   Food Insecurity:   . Worried About Programme researcher, broadcasting/film/video in the Last Year:   . Barista in the Last Year:   Transportation Needs:   . Freight forwarder (Medical):   Marland Kitchen Lack of Transportation (Non-Medical):   Physical Activity:   . Days of Exercise per Week:   . Minutes of Exercise per Session:   Stress:   . Feeling of Stress :   Social Connections:   . Frequency of Communication with Friends and Family:   . Frequency of Social Gatherings with Friends and Family:   . Attends Religious Services:   . Active Member of Clubs or Organizations:   . Attends Banker Meetings:   Marland Kitchen Marital Status:   Intimate Partner Violence:   . Fear of Current or Ex-Partner:   . Emotionally Abused:   Marland Kitchen Physically Abused:   . Sexually Abused:      Review of Systems  All other systems reviewed and are negative.      Objective:   Physical Exam Constitutional:      Appearance: She is well-developed.  Cardiovascular:     Rate and Rhythm: Normal rate and regular rhythm.     Heart sounds: Normal heart sounds.  Pulmonary:     Effort: Pulmonary effort is normal. No respiratory distress.     Breath sounds: Normal breath sounds. No stridor. No wheezing or rales.  Musculoskeletal:     Lumbar back: Tenderness and bony tenderness present. Decreased range of motion.     Right knee: Decreased range of motion. No tenderness. No MCL or LCL tenderness. No MCL laxity.     Left knee: Decreased range of motion. No tenderness.       Legs:           Assessment & Plan:  Bilateral leg weakness  Proximal weakness of extremity  Polyneuropathy  Primary osteoarthritis of both knees  I believe the patient's problem is a combination of PMR coupled  with osteoarthritis coupled with deconditioning coupled with polyneuropathy.  Regardless she is doing dramatically better on prednisone 20 mg a day.  We explained the long-term risk of this however I would like to try to wean her to the lowest effective dose possible.  Therefore we will decrease her prednisone to 15 mg a day.  We will continue to decrease the prednisone slowly as tolerated.  She will call me back monthly and let me know how she is doing.  The neck step will be to decrease to 10 mg a day.  We will need to slow the taper down dramatically at that point.

## 2019-07-09 ENCOUNTER — Ambulatory Visit (INDEPENDENT_AMBULATORY_CARE_PROVIDER_SITE_OTHER): Payer: Medicare Other

## 2019-07-09 DIAGNOSIS — M810 Age-related osteoporosis without current pathological fracture: Secondary | ICD-10-CM

## 2019-07-09 DIAGNOSIS — M6289 Other specified disorders of muscle: Secondary | ICD-10-CM

## 2019-07-09 DIAGNOSIS — M8588 Other specified disorders of bone density and structure, other site: Secondary | ICD-10-CM | POA: Diagnosis not present

## 2019-07-09 NOTE — Progress Notes (Signed)
Pt came in for a prolia injection. Pt tolerated well. Administered in L arm.

## 2019-07-14 ENCOUNTER — Other Ambulatory Visit: Payer: Self-pay | Admitting: Family Medicine

## 2019-07-16 NOTE — Chronic Care Management (AMB) (Addendum)
Chronic Care Management Pharmacy  Name: [name]  MRN: [id number] DOB: [birth year]  Chief Complaint/ HPI  [name],  86 y.o. , female presents for their Initial CCM visit with the clinical pharmacist via telephone.  PCP : Donita Brooks, MD  Their chronic conditions include: hypertension, CKD, osteopenia  Office Visits:  07/01/2019 ([facility]) - bilateral leg weakness, on prednisone and slowly decreasing tapering to lowest effective dose.  06/15/2019 ([facility]) - leg weakness, has not been taking gabapentin, recommend she takes 100mg  tid, prednisone taper started at 20mg   04/15/2019 ([facility]) - needs full assistance standing, with tremendous leg weakness.  Lives alone and is at high fall risk.  Consult Visit:  02/25/2019 ([facility], neuro) - progressive weakness in legs, blood work unremarkable, no myopathy or myositis, MRI of brain did not show any signs of stroke.  Believed to be caused by chronic pain and disuse.  Medications: Outpatient Encounter Medications as of 07/20/2019  Medication Sig   ALPRAZolam (XANAX) 0.5 MG tablet TAKE 1 TO 2 TABLETS BY MOUTH EVERY 8 HOURS AS NEEDED FOR ANXIETY   amLODipine (NORVASC) 5 MG tablet Take 1 tablet by mouth once daily   escitalopram (LEXAPRO) 10 MG tablet Take 1 tablet by mouth once daily   gabapentin (NEURONTIN) 100 MG capsule Take 1 capsule (100 mg total) by mouth 3 (three) times daily as needed. Can be taken as needed for nerve pain   predniSONE (DELTASONE) 10 MG tablet Take 2 tablets (20 mg total) by mouth daily with breakfast.   traMADol (ULTRAM) 50 MG tablet TAKE 1 TABLET BY MOUTH EVERY 8 HOURS AS NEEDED FOR PAIN   Facility-Administered Encounter Medications as of 07/20/2019  Medication   denosumab (PROLIA) injection 60 mg     Current Diagnosis/Assessment:   07/22/2019 Strain: Low Risk    Difficulty of Paying Living Expenses: Not very hard     Goals Addressed             This Visit's Progress     Pharmacy Care Plan:       CARE PLAN ENTRY  Current Barriers:  Chronic Disease Management support, education, and care coordination needs related to Hypertension and Chronic Kidney Disease   Hypertension BP Readings from Last 3 Encounters:  07/01/19 140/60  06/23/19 (!) 146/72  06/15/19 130/74   Pharmacist Clinical Goal(s): Over the next 180 days, patient will work with PharmD and providers to maintain BP goal <140/90 Current regimen:  Amlodipine 5mg  daily Interventions: Comprehensive medication review Continue current therapy Patient self care activities - Over the next 180 days, patient will: Check BP if symptomatic, document, and provide at future appointments Ensure daily salt intake < 2300 mg/day  CKD Pharmacist Clinical Goal(s) Over the next 180 days, patient will work with PharmD and providers to optimize medication regimen as related to current kidney function Interventions: Evaluated safety of all medications based off current renal function Counseled on avoidance of NSAIDs Patient self care activities - Over the next 180 days, patient will: Continue to take all medications as directed Contact PharmD or PCP with any medication related questions or concerns   Initial goal documentation         Hypertension    Office blood pressures are  BP Readings from Last 3 Encounters:  07/01/19 140/60  06/23/19 (!) 146/72  06/15/19 130/74    Patient has failed these meds in the past: lisinopril-HCTZ 20/12.5mg , losartan-HCTZ 20-12.5mg   Patient checks BP at home infrequently  Patient home BP  readings are ranging: no readings available today  Patient is currently controlled on the following medications: amlodipine 5mg  daily  Does not currently monitor BP at home, WNL at most recent office visit.  Does have access to cuff.  No dizziness, chest pain.  Plan  Continue current medications     and  CKD   Kidney Function Lab Results  Component Value Date/Time    CREATININE 1.16 (H) 04/15/2019 09:19 AM   CREATININE 1.27 (H) 12/01/2018 01:00 PM   GFRNONAA 43 (L) 04/15/2019 09:19 AM   GFRAA 50 (L) 04/15/2019 09:19 AM     Currently trying to taper down prednisone to lowest possible dose.  Avoid NSAIDs.  Patient has currently misplaced her Tramadol so reminded her to again no NSAIDs and to contact us if she can not find it.  Plan  Continue current medications.  Contact PharmD or PCP if she cannot find tramadol at home.  Osteopenia   Patient has failed these meds in past: none noted Patient is currently controlled on the following medications:  Prolia 60mg   Patient currently taking Prolia no issues with cost mentioned.  Currently taking her gabapentin tid but reports not feeling well/feeling unstable since taking it during the day.  Reports high risk of falls and numerous falls in her history.  Plan  Continue current medications.  Encouraged gabapentin just at night to minimize fall risk.  Vaccines   Reviewed and discussed patient's vaccination history.    Immunization History  Administered Date(s) Administered   Fluad Quad(high Dose 65+) 12/01/2018   Influenza, High Dose Seasonal PF 11/07/2016, 12/18/2017   Influenza,inj,Quad PF,6+ Mos 12/27/2015   PFIZER SARS-COV-2 Vaccination 03/19/2019   Pneumococcal Polysaccharide-23 07/12/2014    Plan  Recommended patient receive shingles vaccine in office/pharmacy.   Medication Management   Miscellaneous medications: tramadol 50mg , escitalopram 10mg , alprazolam 0.5mg , prednisone 10mg  OTC's: none reported Patient currently uses [facility].  Phone #  [phone] Patient reports using vials to organize medications and promote adherence.  Encouraged her to use pill box which she has at home. Patient denies missed doses of medication.   Beverly Milch, PharmD Clinical Pharmacist [facility] [phone]  I have collaborated with the care management provider  regarding care management and care coordination activities outlined in this encounter and have reviewed this encounter including documentation in the note and care plan. I am certifying that I agree with the content of this note and encounter as supervising physician.

## 2019-07-20 ENCOUNTER — Ambulatory Visit: Payer: Medicare Other | Admitting: Pharmacist

## 2019-07-20 ENCOUNTER — Other Ambulatory Visit: Payer: Self-pay

## 2019-07-20 ENCOUNTER — Other Ambulatory Visit: Payer: Self-pay | Admitting: Family Medicine

## 2019-07-20 DIAGNOSIS — M8580 Other specified disorders of bone density and structure, unspecified site: Secondary | ICD-10-CM

## 2019-07-20 DIAGNOSIS — N183 Chronic kidney disease, stage 3 unspecified: Secondary | ICD-10-CM

## 2019-07-20 DIAGNOSIS — I10 Essential (primary) hypertension: Secondary | ICD-10-CM

## 2019-07-20 NOTE — Patient Instructions (Addendum)
Visit Information Thank you for meeting with me today!  I look forward to working with you to help you meet all of your healthcare goals and answer any questions you may have.  Feel free to contact me anytime!  Goals Addressed            This Visit's Progress   . Pharmacy Care Plan:       CARE PLAN ENTRY  Current Barriers:  . Chronic Disease Management support, education, and care coordination needs related to Hypertension and Chronic Kidney Disease   Hypertension BP Readings from Last 3 Encounters:  07/01/19 140/60  06/23/19 (!) 146/72  06/15/19 130/74   . Pharmacist Clinical Goal(s): o Over the next 180 days, patient will work with PharmD and providers to maintain BP goal <140/90 . Current regimen:  o Amlodipine 5mg  daily . Interventions: o Comprehensive medication review o Continue current therapy . Patient self care activities - Over the next 180 days, patient will: o Check BP if symptomatic, document, and provide at future appointments o Ensure daily salt intake < 2300 mg/day  CKD . Pharmacist Clinical Goal(s) o Over the next 180 days, patient will work with PharmD and providers to optimize medication regimen as related to current kidney function . Interventions: o Evaluated safety of all medications based off current renal function o Counseled on avoidance of NSAIDs . Patient self care activities - Over the next 180 days, patient will: o Continue to take all medications as directed o Contact PharmD or PCP with any medication related questions or concerns   Initial goal documentation        Ms. Viviano was given information about Chronic Care Management services today including:  1. CCM service includes personalized support from designated clinical staff supervised by her physician, including individualized plan of care and coordination with other care providers 2. 24/7 contact phone numbers for assistance for urgent and routine care needs. 3. Standard  insurance, coinsurance, copays and deductibles apply for chronic care management only during months in which we provide at least 20 minutes of these services. Most insurances cover these services at 100%, however patients may be responsible for any copay, coinsurance and/or deductible if applicable. This service may help you avoid the need for more expensive face-to-face services. 4. Only one practitioner may furnish and bill the service in a calendar month. 5. The patient may stop CCM services at any time (effective at the end of the month) by phone call to the office staff.  Patient agreed to services and verbal consent obtained.   The patient verbalized understanding of instructions provided today and agreed to receive a mailed copy of patient instruction and/or educational materials. Telephone follow up appointment with pharmacy team member scheduled for:  01/26/2020 @ 12:30 PM Beverly Milch, PharmD Clinical Pharmacist [facility] [phone]   Chronic Kidney Disease, Adult Chronic kidney disease (CKD) occurs when the kidneys become damaged slowly over a long period of time. The kidneys are a pair of organs that do many important jobs in the body, including:  Removing waste and extra fluid from the blood to make urine.  Making hormones that maintain the amount of fluid in tissues and blood vessels.  Maintaining the right amount of fluids and chemicals in the body. A small amount of kidney damage may not cause problems, but a large amount of damage may make it hard or impossible for the kidneys to work the way they should. If steps are not taken to  slow down kidney damage or to stop it from getting worse, the kidneys may stop working permanently (end-stage renal disease or ESRD). Most of the time, CKD does not go away, but it can often be controlled. People who have CKD are usually able to live normal lives. What are the causes? The most common causes of this  condition are diabetes and high blood pressure (hypertension). Other causes include:  Heart and blood vessel (cardiovascular) disease.  Kidney diseases, such as: ? Glomerulonephritis. ? Interstitial nephritis. ? Polycystic kidney disease. ? Renal vascular disease.  Diseases that affect the immune system.  Genetic diseases.  Medicines that damage the kidneys, such as anti-inflammatory medicines.  Being around or being in contact with poisonous (toxic) substances.  A kidney or urinary infection that occurs again and again (recurs).  Vasculitis. This is swelling or inflammation of the blood vessels.  A problem with urine flow that may be caused by: ? Cancer. ? Having kidney stones more than one time. ? An enlarged prostate, in males. What increases the risk? You are more likely to develop this condition if you:  Are older than age 40.  Are female.  Are African-American, Hispanic, Asian, [place]er, or American [place].  Are a current or former smoker.  Are obese.  Have a family history of kidney disease or failure.  Often take medicines that are damaging to the kidneys. What are the signs or symptoms? Symptoms of this condition include:  Swelling (edema) of the face, legs, ankles, or feet.  Tiredness (lethargy) and having less energy.  Nausea or vomiting.  Confusion or trouble concentrating.  Problems with urination, such as: ? Painful or burning feeling during urination. ? Decreased urine production. ? Frequent urination, especially at night. ? Bloody urine.  Muscle twitches and cramps, especially in the legs.  Shortness of breath.  Weakness.  Loss of appetite.  Metallic taste in the mouth.  Trouble sleeping.  Dry, itchy skin.  A low blood count (anemia).  Pale lining of the eyelids and surface of the eye (conjunctiva). Symptoms develop slowly and may not be obvious until the kidney damage becomes severe. It is possible to have kidney  disease for years without having any symptoms. How is this diagnosed? This condition may be diagnosed based on:  Blood tests.  Urine tests.  Imaging tests, such as an ultrasound or CT scan.  A test in which a sample of tissue is removed from the kidneys to be examined under a microscope (kidney biopsy). These test results will help your health care provider determine how serious the CKD is. How is this treated? There is no cure for most cases of this condition, but treatment usually relieves symptoms and prevents or slows the progression of the disease. Treatment may include:  Making diet changes, which may require you to avoid alcohol, salty foods (sodium), and foods that are high in potassium, calcium, and protein.  Medicines: ? To lower blood pressure. ? To control blood glucose. ? To relieve anemia. ? To relieve swelling. ? To protect your bones. ? To improve the balance of electrolytes in your blood.  Removing toxic waste from the body through types of dialysis, if the kidneys can no longer do their job (kidney failure).  Managing any other conditions that are causing your CKD or making it worse. Follow these instructions at home: Medicines  Take over-the-counter and prescription medicines only as told by your health care provider. The dose of some medicines that you take may need  to be adjusted.  Do not take any new medicines unless approved by your health care provider. Many medicines can worsen your kidney damage.  Do not take any vitamin and mineral supplements unless approved by your health care provider. Many nutritional supplements can worsen your kidney damage. General instructions  Follow your prescribed diet as told by your health care provider.  Do not use any products that contain nicotine or tobacco, such as cigarettes and e-cigarettes. If you need help quitting, ask your health care provider.  Monitor and track your blood pressure at home. Report changes  in your blood pressure as told by your health care provider.  If you are being treated for diabetes, monitor and track your blood sugar (blood glucose) levels as told by your health care provider.  Maintain a healthy weight. If you need help with this, ask your health care provider.  Start or continue an exercise plan. Exercise at least 30 minutes a day, 5 days a week.  Keep your immunizations up to date as told by your health care provider.  Keep all follow-up visits as told by your health care provider. This is important. Where to find more information  American Association of Kidney Patients: ResidentialShow.is  [employer]: www.kidney.org  American Kidney Fund: FightingMatch.com.ee  [employer] [facility]: www.lifeoptions.org and www.kidneyschool.org Contact a health care provider if:  Your symptoms get worse.  You develop new symptoms. Get help right away if:  You develop symptoms of ESRD, which include: ? Headaches. ? Numbness in the hands or feet. ? Easy bruising. ? Frequent hiccups. ? Chest pain. ? Shortness of breath. ? Lack of menstruation, in women.  You have a fever.  You have decreased urine production.  You have pain or bleeding when you urinate. Summary  Chronic kidney disease (CKD) occurs when the kidneys become damaged slowly over a long period of time.  The most common causes of this condition are diabetes and high blood pressure (hypertension).  There is no cure for most cases of this condition, but treatment usually relieves symptoms and prevents or slows the progression of the disease. Treatment may include a combination of medicines and lifestyle changes. This information is not intended to replace advice given to you by your health care provider. Make sure you discuss any questions you have with your health care provider. Document Revised: 01/03/2017 Document Reviewed: 02/29/2016 Elsevier Patient Education  2020 [employer].

## 2019-07-23 ENCOUNTER — Other Ambulatory Visit: Payer: Self-pay | Admitting: Family Medicine

## 2019-07-23 ENCOUNTER — Telehealth: Payer: Self-pay | Admitting: Pharmacist

## 2019-07-23 DIAGNOSIS — M5136 Other intervertebral disc degeneration, lumbar region: Secondary | ICD-10-CM

## 2019-07-23 MED ORDER — TRAMADOL HCL 50 MG PO TABS: 50.0000 mg | ORAL_TABLET | Freq: Three times a day (TID) | ORAL | 0 refills | Status: DC | PRN

## 2019-07-23 NOTE — Telephone Encounter (Signed)
I will send in tramadol. 

## 2019-07-23 NOTE — Telephone Encounter (Signed)
Patient mentioned at her visit earlier this week she had lost her Tramadol.  She went home to look for it and has still not found it.  Wondering if we can call her in a refill with a note "OK to fill early" since she lost it.  Willa Frater, PharmD Clinical Pharmacist [facility] [phone]

## 2019-08-26 ENCOUNTER — Other Ambulatory Visit: Payer: Self-pay | Admitting: Family Medicine

## 2019-08-26 NOTE — Telephone Encounter (Signed)
Last OV 06/25/19 Last refill 04/15/19

## 2019-09-09 DIAGNOSIS — H40153 Residual stage of open-angle glaucoma, bilateral: Secondary | ICD-10-CM | POA: Diagnosis not present

## 2019-09-14 ENCOUNTER — Other Ambulatory Visit: Payer: Self-pay | Admitting: Family Medicine

## 2019-09-14 DIAGNOSIS — M5136 Other intervertebral disc degeneration, lumbar region: Secondary | ICD-10-CM

## 2019-09-14 DIAGNOSIS — F5101 Primary insomnia: Secondary | ICD-10-CM

## 2019-09-14 NOTE — Telephone Encounter (Signed)
Ok to refill??  Last office visit 07/01/2019.  Last refill on Tramadol 07/23/2019.  Last refill on Xanax 06/17/2019, #2 refills.

## 2019-10-01 ENCOUNTER — Other Ambulatory Visit: Payer: Self-pay | Admitting: Family Medicine

## 2019-10-05 ENCOUNTER — Other Ambulatory Visit: Payer: Self-pay

## 2019-10-05 DIAGNOSIS — H40153 Residual stage of open-angle glaucoma, bilateral: Secondary | ICD-10-CM | POA: Diagnosis not present

## 2019-10-13 ENCOUNTER — Other Ambulatory Visit: Payer: Self-pay | Admitting: Family Medicine

## 2019-10-13 DIAGNOSIS — F5101 Primary insomnia: Secondary | ICD-10-CM

## 2019-10-13 NOTE — Telephone Encounter (Signed)
Ok to refill??  Last office visit 07/01/2019.  Last refill 09/14/2019.  Ok to add refills to prescription?

## 2019-10-22 ENCOUNTER — Other Ambulatory Visit: Payer: Self-pay | Admitting: Family Medicine

## 2019-10-22 DIAGNOSIS — M5136 Other intervertebral disc degeneration, lumbar region: Secondary | ICD-10-CM

## 2019-10-22 NOTE — Telephone Encounter (Signed)
Ok to refill??  Last office visit 07/01/2019.  Last refill 09/14/2019.

## 2019-11-08 ENCOUNTER — Ambulatory Visit (INDEPENDENT_AMBULATORY_CARE_PROVIDER_SITE_OTHER): Payer: Medicare Other | Admitting: Family Medicine

## 2019-11-08 ENCOUNTER — Other Ambulatory Visit: Payer: Self-pay | Admitting: Family Medicine

## 2019-11-08 ENCOUNTER — Other Ambulatory Visit: Payer: Self-pay

## 2019-11-08 ENCOUNTER — Encounter: Payer: Self-pay | Admitting: Family Medicine

## 2019-11-08 VITALS — BP 144/60 | HR 79 | Temp 98.2°F | Ht 62.0 in | Wt 156.1 lb

## 2019-11-08 DIAGNOSIS — D692 Other nonthrombocytopenic purpura: Secondary | ICD-10-CM

## 2019-11-08 DIAGNOSIS — N1832 Chronic kidney disease, stage 3b: Secondary | ICD-10-CM | POA: Diagnosis not present

## 2019-11-08 DIAGNOSIS — G629 Polyneuropathy, unspecified: Secondary | ICD-10-CM

## 2019-11-08 DIAGNOSIS — N183 Chronic kidney disease, stage 3 unspecified: Secondary | ICD-10-CM

## 2019-11-08 DIAGNOSIS — Z23 Encounter for immunization: Secondary | ICD-10-CM

## 2019-11-08 DIAGNOSIS — I10 Essential (primary) hypertension: Secondary | ICD-10-CM

## 2019-11-08 DIAGNOSIS — R29898 Other symptoms and signs involving the musculoskeletal system: Secondary | ICD-10-CM | POA: Diagnosis not present

## 2019-11-08 DIAGNOSIS — M353 Polymyalgia rheumatica: Secondary | ICD-10-CM | POA: Diagnosis not present

## 2019-11-08 LAB — COMPLETE METABOLIC PANEL WITH GFR
AG Ratio: 1.9 (calc) (ref 1.0–2.5)
ALT: 20 U/L (ref 6–29)
AST: 17 U/L (ref 10–35)
Albumin: 4 g/dL (ref 3.6–5.1)
Alkaline phosphatase (APISO): 36 U/L — ABNORMAL LOW (ref 37–153)
BUN/Creatinine Ratio: 25 (calc) — ABNORMAL HIGH (ref 6–22)
BUN: 31 mg/dL — ABNORMAL HIGH (ref 7–25)
CO2: 27 mmol/L (ref 20–32)
Calcium: 9.6 mg/dL (ref 8.6–10.4)
Chloride: 103 mmol/L (ref 98–110)
Creat: 1.25 mg/dL — ABNORMAL HIGH (ref 0.60–0.88)
GFR, Est African American: 45 mL/min/{1.73_m2} — ABNORMAL LOW (ref >=60)
GFR, Est Non African American: 39 mL/min/{1.73_m2} — ABNORMAL LOW (ref >=60)
Globulin: 2.1 g/dL (calc) (ref 1.9–3.7)
Glucose, Bld: 107 mg/dL — ABNORMAL HIGH (ref 65–99)
Potassium: 4.3 mmol/L (ref 3.5–5.3)
Sodium: 138 mmol/L (ref 135–146)
Total Bilirubin: 0.5 mg/dL (ref 0.2–1.2)
Total Protein: 6.1 g/dL (ref 6.1–8.1)

## 2019-11-08 LAB — CBC WITH DIFFERENTIAL/PLATELET
Absolute Monocytes: 392 cells/uL (ref 200–950)
Basophils Absolute: 32 cells/uL (ref 0–200)
Basophils Relative: 0.4 %
Eosinophils Absolute: 16 cells/uL (ref 15–500)
Eosinophils Relative: 0.2 %
HCT: 39 % (ref 35.0–45.0)
Hemoglobin: 13.1 g/dL (ref 11.7–15.5)
Lymphs Abs: 1088 cells/uL (ref 850–3900)
MCH: 31.6 pg (ref 27.0–33.0)
MCHC: 33.6 g/dL (ref 32.0–36.0)
MCV: 94 fL (ref 80.0–100.0)
MPV: 10.5 fL (ref 7.5–12.5)
Monocytes Relative: 4.9 %
Neutro Abs: 6472 cells/uL (ref 1500–7800)
Neutrophils Relative %: 80.9 %
Platelets: 198 10*3/uL (ref 140–400)
RBC: 4.15 10*6/uL (ref 3.80–5.10)
RDW: 12.8 % (ref 11.0–15.0)
Total Lymphocyte: 13.6 %
WBC: 8 10*3/uL (ref 3.8–10.8)

## 2019-11-08 LAB — PT WITH INR/FINGERSTICK
INR, fingerstick: 0.9 ratio
PT, fingerstick: 11.1 s (ref 10.5–13.1)

## 2019-11-08 NOTE — Progress Notes (Signed)
Subjective:    Patient ID: [name], female    DOB: 05-28-1931, 86 y.o.   MRN: [id number]  HPI 9/19 Patient presents today complaining of elevated blood pressure.  She states that over the weekend, her blood pressure was as high as 170 systolic over 90 diastolic.  It has been steadily rising over the last few weeks.  She denies any chest pain shortness of breath or dyspnea on exertion.  She became alarmed and therefore this weekend she took 1 of her old blood pressure medications, losartan HCTZ.  Her blood pressure today is well controlled however she has taken her old medication twice over the weekend.  We previously discontinued the medication due to acute kidney injury in the early summer with a rise in her creatinine to 1.73 as well as an elevated potassium of 5.9.  Even after discontinuing losartan HCTZ, her potassium remain borderline elevated at 5.2.  Patient also complains of knee pain and ankle pain.  Early this summer, she tripped over an exposed pipe trading from the wall of her home striking her right shin as she walked by.  She fell on her lateral right ankle as well as her right knee.  Ever since that time, she complains of pain and stiffness in the right knee particularly in the posterior lateral aspect of the right knee as well as swelling behind the knee in the popliteal fossa.  She also complains of pain distal and anterior to the lateral malleolus in the area of the anterior talofibular ligament.  Initially she can barely put weight on her foot however over the last few weeks, the pain has improved and subsided.  She still has pain with ambulation.  She also reports subjective swelling around the right knee.  At that time, my plan was: DC losartan HCTZ due to her history of acute kidney injury as well as hyperkalemia.  Replace with amlodipine 5 mg a day and monitor her blood pressure.  I believe the pain in her right knee is likely due to the fall, osteoarthritis, and possibly  a lateral meniscal tear although the exam is not specific today.  She cannot tolerate NSAIDs due to her mild chronic kidney disease.  Therefore I offered the patient a cortisone injection in the right knee.  She politely declined as the pain is not that severe.  I believe the pain in her right ankle is likely due to a sprain in the anterior talofibular ligament.  This is slowly improving.  I recommended the patient wear an ASO brace with ambulation to provide more ankle support and reassess in 2 to 3 weeks or sooner if worsening.  Patient is bearing weight without any difficulty and therefore I believe the fracture is unlikely.  12/01/18 Patient presents today complaining of problems in both her back and her legs.  She saw my partner in June who obtained a CT scan of the lumbar spine.  The results of the CT scan are included below:  IMPRESSION: Mild inferior endplate compression fracture of T11 and superior endplate compression fracture of T12 appear late subacute to remote. The fractures are most in keeping with senile osteoporotic/posttraumatic injuries.  Calcified right subarticular recess and foraminal protrusion at T11-12 could impact the exiting right T11 and descending right T12 roots.  Scattered foraminal narrowing appears worst at L3-4 where it is moderate to moderately severe and greater on the right.  Patient was referred to orthopedic surgery.  They have recommended bilateral knee replacements due  to osteoarthritis in the knees.  However today the patient's history seems confusing to me.  She denies any knee pain.  She does report audible crepitus in the knee however the majority of the pain is in her lower back.  She also complains of severe weakness in the legs.  Muscle strength is 5/5 equal and symmetric with knee extension and knee flexion as well as ankle dorsiflexion and ankle plantarflexion however hip flexion is extremely weak and is at best 3/5.  Hip extension is also weak  and is at best 4/5.  I am also unable to elicit any reflexes in either knee at the patella.  Patient states that as soon she stands her legs feel extremely weak.  I performed time stand up and go test.  The patient can only push herself to a standing position using her arms.  Her legs wobble and sway the entire time as if she is barely able to support her weight.  I believe that deconditioning has led to severe muscle atrophy in the proximal hip muscles.  Patient reports that she is also falling and feels unsteady on her feet.  She is having to use a walker.  However the majority of the pains in her lower back.  At that time, my plan was: Patient has significant leg weakness.  Some of this could be due to deconditioning however I am concerned about possible neurogenic claudication.  I have recommended physical therapy consultation at home to try to improve the strength in her proximal muscles in her legs to reduce her risk of following.  Meanwhile I will start the patient on a prednisone taper pack for what I suspect a spinal stenosis.  Recheck the patient in 1 week.  If the pain in her lower back is improving I would recommend an MRI of her back at that time to determine if she would benefit from epidural steroid injections and to determine the level where she would benefit the most.  Her exam today suggest more of spinal stenosis with resultant leg weakness.  12/08/18  Patient presents today for follow-up.  She saw some improvement on the prednisone perhaps 25% improvement.  She reports good days and bad days however today is a bad day.  She continues to report severe pain in the center of her back.  She also continues to demonstrate proximal leg muscle weakness.  She has barely able to stand from a seated position and this is only with holding onto a walker.  She is now having to use a walker simply to walk.  She does not feel deconditioning is causing this because she states that she is active every day  cleaning her house and using a leaf blower however she progressively is getting weaker and weaker and is requiring a walker now to walk.  She also reports some numbness and burning in both feet.  Both legs frequently give out on her and she has fallen several times.  I am concerned about neurogenic claudication.  She also continues to have severe pain in both knees secondary to osteoarthritis of the knees however my biggest concern is her progressive muscle weakness in both legs.  At that time, my plan was: Leg weakness seems to be progressively worsening.  I am concerned about neurogenic claudication.  She has yet to hear from physical therapy however she is falling more frequently and is now having to use a walker simply to get out of a chair.  Therefore I will  proceed with an MRI of the lumbar spine to evaluate further.  If there is significant lumbar spinal stenosis, I would recommend a referral to neurosurgery to discuss treatment options or perhaps epidural steroid injections particular to improve her leg weakness.  Patient was also recommended not to take any further NSAIDs as her lab work shows chronic kidney disease and the NSAIDs are likely exacerbating this.  She can use tramadol.  Await the results of the MRI  12/17/18 MRI revealed:  L3-4: 5 mm retrolisthesis with disc and facet degeneration. Diffuse endplate spurring. Severe subarticular and foraminal stenosis on the right. Moderate subarticular and foraminal stenosis on the left. Spinal canal adequate in size  L4-5: Disc degeneration and spurring. Moderate subarticular stenosis on the right due to spurring  L5-S1: Disc degeneration with endplate spurring. Moderate subarticular and foraminal stenosis bilaterally due to spurring.  Patient is here today with her son to discuss the findings.  Her biggest concern is her inability to walk.  She states that she has a difficult time standing from a seated position due to the weakness in her  legs.  When she does stand, her legs feel extremely wobbly and feel like they are going to give way on her.  After standing for a short period of time she has to sit down because of the weakness in her legs particularly the proximal muscles in the quadriceps and hamstrings bilaterally.  She also has significant pain in both knees however her leg weakness and low back pain seem to be her bigger issues.  At that time, my plan was: Patient has severe subarticular stenosis at L3-L4 and moderate subarticular and foraminal stenosis at multiple other levels.  I question if she would benefit from possible epidural steroid injections in the back.  There may be an element of nerve compression due to the foraminal stenosis that could be causing lumbosacral radiculopathy and leg weakness.  If this can help improve her low back pain and improve her leg weakness, the patient would certainly be willing to consider it.  Therefore I will consult Dr. Ethelene Hal at [facility] to evaluate for this.   04/15/19 Patient has since seen neurology.  They have evaluated the patient for neuromuscular diseases.  They have found no evidence of neuromuscular diseases.  Specifically EMG/nerve conduction studies did show length dependent axonal polyneuropathy consistent with peripheral neuropathy.  However there was no evidence of any myositis or muscular disease.  Patient does have some burning neuropathic pain in both feet.  This tends to get worse later in the day.  She would like something that she could take for neuropathy.  However she is also taking tramadol to 3 and sometimes 4 times a day for the pain in her knees and the pain in her back.  She is unable to take NSAIDs due to her chronic kidney disease.  Today on exam, the patient is unable to stand from a seated position despite holding onto her walker and pushing off on the walker.  I would literally have to help the patient stand in a process that takes roughly 15 to 20 seconds with  assistance.  Her proximal muscle strength is extremely weak and her quads and hamstrings.  She recently had an MRI of the brain and cervical spine however she has not received results yet from the neurologist.  She also had extensive lab work to evaluate for neuromuscular diseases which are normal.  Patient would benefit dramatically from a lift chair.  She  lives independently and has an extremely difficult time even getting to a standing position.  She is at high risk for falls due to this.  Therefore I have strongly recommended a lift chair.  At that time, my plan was: Patient would like to try gabapentin 100 mg p.o. every 8 hours as needed nerve pain in her legs and feet.  I explained to the patient that this medication can cause dizziness sleepiness.  Therefore I want her to use it sparingly and slowly uptitrate on the medication.  Also cautioned the patient not to use this medication with Xanax or to use the medication with tramadol.  She can use them separately but not at the same time.  We can uptitrate the dose of the gabapentin if she tolerates it to 300 mg 3 times a day once she is adjusted to it.  However I would do this slowly to avoid polypharmacy and falls.  I also have strongly recommended a lift chair.  Patient has severe arthritis in her back and also in her knees.  She also has profound muscle weakness in her quadriceps and hamstrings bilaterally.  She is unable to stand from a seated position despite my assistance.  I literally had to pull the patient to a standing position over a period of 15 to 20 seconds.  However once she is standing she is able to ambulate using a walker.  Therefore I believe that she would benefit from a lift seat mechanism to help her get to a standing position from the seated position in her chair.  She is already seen orthopedics and has tried cortisone injections with no relief and has had an extensive work-up under neurology showing no evidence of any underlying muscle  issues however she does have peripheral neuropathy.  She has tried physical therapy as well as medication with minimal benefit.  06/15/19 Since I last saw the patient, she has seen neurology.  Neurologist performed a very thorough evaluation for underlying muscle disease such as myositis, PMR, myasthenia gravis.  She also performed evaluation including nerve conduction studies with EMGs that were normal.  MRI of the lumbar spine shows no significant spinal stenosis.  MRI of the cervical spine shows moderate cervical spinal stenosis however she is seen a neurosurgeon who did not recommend surgery as he did not feel the patient is impacting her legs.  She also reports that she has had an MRI of the thoracic spine at the neurosurgeons office that showed no significant spinal stenosis that would cause underlying leg weakness.  She states the majority of her pain is in her knees.  However she also reports pain in her knees down to her toes.  She states that her legs ache and crawl.  As long as she is sitting still, she has no pain however as soon as she stands or tries to fall she developed severe pain in her knees lower legs and in her feet.  She also has diffuse leg weakness specifically in her hip flexors and hip extensors.  She is unable to stand from a chair without using a walker to push herself to a standing position using her arms.  This requires great effort in case more than 10 seconds for her to stand up.  She states that she is miserable.  She seems very frustrated.  She states that no one is giving her any explanation for what is going on.  She states that she is tired of bouncing from doctor to doctor  without any explanation.  At that time, my plan was: Patient is very frustrated.  I have reviewed lab work from neurology.  I appreciate all of their help.  A very thorough work-up revealed no specific pathologic issue other than polyneuropathy.  Patient has not been taking gabapentin 100 mg 3 times a day.   She has not been taking it all.  I would like her to add gabapentin 100 mg 3 times a day as I believe some of the pain distal to her knees in her calves and in her feet is likely neuropathic pain.  Her pain is out of proportion to her diagnostic work-up today and therefore I believe this must be neuropathic in nature.  However I also believe it is possible that she may be demonstrating PMR given her proximal muscle weakness.  Even though her sed rate was normal at the neurologist office I would like to try the patient on prednisone 20 mg a day empirically for possible PMR.  I would like to see the patient back in 2 weeks to see if the combination of prednisone and gabapentin has provided her any relief.  Recheck sooner if worse.  07/01/19 Patient states that she is almost 100% better!  She still has a difficult time standing from a seated position however she is able to do this today on first attempt with only mild effort whereas before she could barely stand despite holding onto the chair and a walker.  She still has proximal muscle weakness however the rapid improvement on prednisone makes me suspect involvement of PMR.  Also believe she likely has arthritis and the prednisone was treating this.  We had to avoid NSAIDs due to chronic kidney disease.  Whether his arthritis or PMR, the patient has dramatically responded to prednisone 20 mg a day.  We spent the remainder of our visit today discussing the risk of the prednisone.  I explained that it will make osteoporosis worse.  A week of her immune system.  I will raise her blood sugar.  Will cause weight gain.  However the patient states that she would rather take this pill and die a year or sooner for continued pain that she was experiencing before.  I would like to try to wean her to the lowest effective dose.  At that time, my plan was: I believe the patient's problem is a combination of PMR coupled with osteoarthritis coupled with deconditioning coupled with  polyneuropathy.  Regardless she is doing dramatically better on prednisone 20 mg a day.  We explained the long-term risk of this however I would like to try to wean her to the lowest effective dose possible.  Therefore we will decrease her prednisone to 15 mg a day.  We will continue to decrease the prednisone slowly as tolerated.  She will call me back monthly and let me know how she is doing.  The neck step will be to decrease to 10 mg a day.  We will need to slow the taper down dramatically at that point.  11/08/19 Patient is here today for a checkup.  She states that she feels great on 10 mg of prednisone.  When she tries to drop below 10 mg of prednisone she develops weakness and pain in her legs.  Especially in her proximal muscles.  However on 10 mg of prednisone she is able to stand up and the pain in her back and in her knees and in her legs improves.  She continues to  have severe pain in her left knee which is due to osteoarthritis which does not improve on the prednisone but overall she is doing well on 10 mg of prednisone.  She is due to recheck her lab work today to monitor her kidney disease.  However her biggest concern today is bruising.  She states that recently she has been bruising very easily on the dorsums of both arms as well as on her shins.  Patient has large purpura on her shins and numerous small petechiae and purpura on the dorsums of both forearms.  Please see the photograph below    Past Medical History:  Diagnosis Date  . Cataract   . CKD (chronic kidney disease) stage 3, GFR 30-59 ml/min (HCC)   . Fatty liver disease, nonalcoholic    2007- admitted with encephalopathy unsure of cause, resolved sponatneously  . Hypertension    Past Surgical History:  Procedure Laterality Date  . FRACTURE SURGERY     right shoulder, both wrists   . JOINT REPLACEMENT     HIP- bilateral   Current Outpatient Medications on File Prior to Visit  Medication Sig Dispense Refill  .  traMADol (ULTRAM) 50 MG tablet TAKE 1 TABLET BY MOUTH EVERY 8 HOURS AS NEEDED 60 tablet 0  . ALPRAZolam (XANAX) 0.5 MG tablet TAKE 1 TO 2 TABLETS BY MOUTH EVERY 8 HOURS AS NEEDED FOR ANXIETY 60 tablet 3  . amLODipine (NORVASC) 5 MG tablet Take 1 tablet by mouth once daily 90 tablet 3  . escitalopram (LEXAPRO) 10 MG tablet Take 1 tablet by mouth once daily 90 tablet 0  . gabapentin (NEURONTIN) 100 MG capsule TAKE 1 CAPSULE BY MOUTH THREE TIMES DAILY AS NEEDED CAN  BE  TAKEN  AS  NEEDED  FOR  NERVE  PAIN 90 capsule 0  . predniSONE (DELTASONE) 10 MG tablet Take 2 tablets (20 mg total) by mouth daily with breakfast. 60 tablet 5   Current Facility-Administered Medications on File Prior to Visit  Medication Dose Route Frequency Provider Last Rate Last Admin  . denosumab (PROLIA) injection 60 mg  60 mg Subcutaneous Q6 months Donita Brooks, MD   60 mg at 07/09/19 0800   Allergies  Allergen Reactions  . Pneumococcal Vaccines Other (See Comments)    Pt had localized reaction to injection of Pneumococcal 23 - Swelling and redness in upper arm that extend to but not beyond elbow   Social History   Socioeconomic History  . Marital status: Widowed    Spouse name: Not on file  . Number of children: 1  . Years of education: Not on file  . Highest education level: Not on file  Occupational History  . Not on file  Tobacco Use  . Smoking status: Never Smoker  . Smokeless tobacco: Never Used  Substance and Sexual Activity  . Alcohol use: Not Currently  . Drug use: Never  . Sexual activity: Not Currently    Comment: raised tobacco, retired  Other Topics Concern  . Not on file  Social History Narrative   Lives alone   Right handed    Caffeine: 1/2-1 cup coffee in AM   Social Determinants of Health   Financial Resource Strain: Low Risk   . Difficulty of Paying Living Expenses: Not very hard  Food Insecurity:   . Worried About Programme researcher, broadcasting/film/video in the Last Year: Not on file  . Ran Out of  Food in the Last Year: Not on file  Transportation Needs:   .  Lack of Transportation (Medical): Not on file  . Lack of Transportation (Non-Medical): Not on file  Physical Activity:   . Days of Exercise per Week: Not on file  . Minutes of Exercise per Session: Not on file  Stress:   . Feeling of Stress : Not on file  Social Connections:   . Frequency of Communication with Friends and Family: Not on file  . Frequency of Social Gatherings with Friends and Family: Not on file  . Attends Religious Services: Not on file  . Active Member of Clubs or Organizations: Not on file  . Attends BankerClub or Organization Meetings: Not on file  . Marital Status: Not on file  Intimate Partner Violence:   . Fear of Current or Ex-Partner: Not on file  . Emotionally Abused: Not on file  . Physically Abused: Not on file  . Sexually Abused: Not on file     Review of Systems  All other systems reviewed and are negative.      Objective:   Physical Exam Constitutional:      Appearance: She is well-developed.  Cardiovascular:     Rate and Rhythm: Normal rate and regular rhythm.     Heart sounds: Normal heart sounds.  Pulmonary:     Effort: Pulmonary effort is normal. No respiratory distress.     Breath sounds: Normal breath sounds. No stridor. No wheezing or rales.  Musculoskeletal:     Lumbar back: Tenderness and bony tenderness present. Decreased range of motion.     Right knee: Decreased range of motion. No tenderness. No MCL or LCL tenderness. No MCL laxity.     Left knee: Decreased range of motion. No tenderness.           Assessment & Plan:  Stage 3 chronic kidney disease, unspecified whether stage 3a or 3b CKD (HCC) - Plan: Flu Vaccine QUAD High Dose(Fluad), CBC with Differential/Platelet, COMPLETE METABOLIC PANEL WITH GFR  Essential hypertension - Plan: Flu Vaccine QUAD High Dose(Fluad), CBC with Differential/Platelet, COMPLETE METABOLIC PANEL WITH GFR  Polyneuropathy  Bilateral leg  weakness  PMR (polymyalgia rheumatica) (HCC) - Plan: Flu Vaccine QUAD High Dose(Fluad), CBC with Differential/Platelet, COMPLETE METABOLIC PANEL WITH GFR  Senile purpura (HCC) - Plan: CBC with Differential/Platelet, COMPLETE METABOLIC PANEL WITH GFR, PT with INR/Fingerstick  I believe the patient's bruises are due to senile purpura.  I will check a CBC to evaluate for thrombocytopenia.  I will check a CMP to monitor for any liver dysfunction and I will check a PT/INR to evaluate for any clotting factor abnormalities.  As long as this is normal, I simply reassured the patient that these are benign.  Patient's PMR is stable on 10 mg of prednisone however she is unable to wean below 10 mg of prednisone.  We discussed seeing a rheumatologist to possibly switch to methotrexate however at the present time the patient has no interest in doing this and prefers to stay on 10 mg of prednisone despite the increased risk.  Patient received her flu shot today.  I will check a CMP to monitor his kidney function.

## 2019-11-16 ENCOUNTER — Other Ambulatory Visit: Payer: Self-pay | Admitting: Family Medicine

## 2019-11-16 DIAGNOSIS — M5136 Other intervertebral disc degeneration, lumbar region: Secondary | ICD-10-CM

## 2019-11-17 NOTE — Telephone Encounter (Signed)
Last office visit - 11/08/19  Last refill of medication - 10/22/19 for Tramadol   And 10/04/19 for Gabapentin

## 2019-12-23 ENCOUNTER — Other Ambulatory Visit: Payer: Self-pay | Admitting: Family Medicine

## 2019-12-23 DIAGNOSIS — M5136 Other intervertebral disc degeneration, lumbar region: Secondary | ICD-10-CM

## 2019-12-23 NOTE — Telephone Encounter (Signed)
Please Advise

## 2019-12-28 ENCOUNTER — Encounter: Payer: Self-pay | Admitting: *Deleted

## 2020-01-03 ENCOUNTER — Other Ambulatory Visit: Payer: Self-pay | Admitting: Family Medicine

## 2020-01-12 ENCOUNTER — Other Ambulatory Visit: Payer: Self-pay

## 2020-01-12 ENCOUNTER — Ambulatory Visit (INDEPENDENT_AMBULATORY_CARE_PROVIDER_SITE_OTHER): Payer: Medicare Other | Admitting: *Deleted

## 2020-01-12 DIAGNOSIS — M810 Age-related osteoporosis without current pathological fracture: Secondary | ICD-10-CM | POA: Diagnosis not present

## 2020-01-12 MED ORDER — DENOSUMAB 60 MG/ML ~~LOC~~ SOSY
60.0000 mg | PREFILLED_SYRINGE | Freq: Once | SUBCUTANEOUS | Status: AC
  Administered 2020-01-12: 60 mg via SUBCUTANEOUS

## 2020-01-14 NOTE — Chronic Care Management (AMB) (Signed)
Chronic Care Management Pharmacy  Name: [name]  MRN: [id number] DOB: [birth year]  Chief Complaint/ HPI  [name],  87 y.o. , female presents for their Follow-Up CCM visit with the clinical pharmacist via telephone.  PCP : Donita Brooks, MD  Their chronic conditions include: hypertension, CKD, osteopenia  Office Visits: 11/08/2019 (Pickard) - patient reports she feels great on 10mg  of prednisone and feels weakness in her pain or legs when she decreases dose.  She has been reporting bruising on both arms and shins.  No meds changes, PCP mentions methotrexate but she has no interest in starting this therapy at this time.  07/01/2019 (Pickard) - bilateral leg weakness, on prednisone and slowly decreasing tapering to lowest effective dose.  06/15/2019 (Pickard) - leg weakness, has not been taking gabapentin, recommend she takes 100mg  tid, prednisone taper started at 20mg   04/15/2019 (Pickard) - needs full assistance standing, with tremendous leg weakness.  Lives alone and is at high fall risk.  Consult Visit:  02/25/2019 (Pickard, neuro) - progressive weakness in legs, blood work unremarkable, no myopathy or myositis, MRI of brain did not show any signs of stroke.  Believed to be caused by chronic pain and disuse.  Medications: Outpatient Encounter Medications as of 01/26/2020  Medication Sig  . ALPRAZolam (XANAX) 0.5 MG tablet TAKE 1 TO 2 TABLETS BY MOUTH EVERY 8 HOURS AS NEEDED FOR ANXIETY  . amLODipine (NORVASC) 5 MG tablet Take 1 tablet by mouth once daily  . escitalopram (LEXAPRO) 10 MG tablet Take 1 tablet by mouth once daily  . gabapentin (NEURONTIN) 100 MG capsule TAKE 1 CAPSULE BY MOUTH THREE TIMES DAILY AS NEEDED CAN  BE  TAKEN  AS  NEEDED  FOR  NERVE  PAIN  . predniSONE (DELTASONE) 10 MG tablet Take 2 tablets (20 mg total) by mouth daily with breakfast.  . traMADol (ULTRAM) 50 MG tablet TAKE 1 TABLET BY MOUTH EVERY 8 HOURS AS NEEDED    Facility-Administered Encounter Medications as of 01/26/2020  Medication  . denosumab (PROLIA) injection 60 mg     Current Diagnosis/Assessment: 02/27/2019 Strain: Low Risk   . Difficulty of Paying Living Expenses: Not very hard     Goals Addressed            This Visit's Progress   . Pharmacy Care Plan:       CARE PLAN ENTRY  Current Barriers:  . Chronic Disease Management support, education, and care coordination needs related to Hypertension and Chronic Kidney Disease   Hypertension BP Readings from Last 3 Encounters:  07/01/19 140/60  06/23/19 (!) 146/72  06/15/19 130/74   . Pharmacist Clinical Goal(s): o Over the next 120 days, patient will work with PharmD and providers to maintain BP goal <140/90 . Current regimen:  o Amlodipine 5mg  daily . Interventions: o Comprehensive medication review o Continue current therapy . Patient self care activities - Over the next 120 days, patient will: o Check BP if symptomatic, document, and provide at future appointments o Ensure daily salt intake < 2300 mg/day  CKD . Pharmacist Clinical Goal(s) o Over the next 120 days, patient will work with PharmD and providers to optimize medication regimen as related to current kidney function . Interventions: o Evaluated safety of all medications based off current renal function o Counseled on avoidance of NSAIDs o Encouraged her to continue to use lowest effective dose of prednisone possible . Patient self care activities - Over the next 120 days, patient will:  o Continue to take all medications as directed o Contact PharmD or PCP with any medication related questions or concerns   Please see past updates related to this goal by clicking on the "Past Updates" button in the selected goal         Hypertension    Office blood pressures are  BP Readings from Last 3 Encounters:  11/08/19 (!) 144/60  07/01/19 140/60  06/23/19 (!) 146/72    Patient has failed  these meds in the past: lisinopril-HCTZ 20/12.5mg , losartan-HCTZ 20-12.5mg   Patient checks BP at home infrequently  Patient home BP readings are ranging: no readings available today  Patient is currently controlled on the following medications:   amlodipine 5mg  daily  We discussed:  Still not monitoring BP at home, it has been acceptable the last few times in office  She denies dizziness, headaches  Verbalizes adherence with amlodipine daily and denies swelling  Plan  Continue current medications    CKD   Kidney Function Lab Results  Component Value Date/Time   CREATININE 1.25 (H) 11/08/2019 11:15 AM   CREATININE 1.16 (H) 04/15/2019 09:19 AM   GFRNONAA 39 (L) 11/08/2019 11:15 AM   GFRAA 45 (L) 11/08/2019 11:15 AM     Evaluated meds for renal safety.  She is still trying to taper down her prednisone use, however, she is not having luck.  With 10mg  dose she is still experiencing pain and can hardly get out of bed.  She is wanting to go back up to 15mg , will consult with PCP to have him follow up.  Reminded patient to avoid NSAIDs.  Plan  Continue current medications.   Osteopenia   Patient has failed these meds in past: none noted Patient is currently controlled on the following medications:  . Prolia 60mg   Patient currently taking Prolia no issues with cost mentioned.  Now has moved to only taking gabapentin at night due to fall risk.   Plan  Continue current medications.    Vaccines   Reviewed and discussed patient's vaccination history.    Immunization History  Administered Date(s) Administered  . Fluad Quad(high Dose 65+) 12/01/2018, 11/08/2019  . Influenza, High Dose Seasonal PF 11/07/2016, 12/18/2017  . Influenza,inj,Quad PF,6+ Mos 12/27/2015  . PFIZER SARS-COV-2 Vaccination 03/19/2019  . Pneumococcal Polysaccharide-23 07/12/2014    Plan  Recommended patient receive shingles vaccine in office/pharmacy.   Medication Management    . Miscellaneous medications: tramadol 50mg , escitalopram 10mg , alprazolam 0.5mg , prednisone 10mg  . OTC's: none reported . Patient currently uses 12/20/2017.  Phone #  [phone] . Patient reports using vials to organize medications and promote adherence.  Encouraged her to use pill box which she has at home. . Patient denies missed doses of medication.   05/17/2019, PharmD Clinical Pharmacist [facility] [phone]

## 2020-01-26 ENCOUNTER — Ambulatory Visit: Payer: Self-pay | Admitting: Pharmacist

## 2020-01-26 DIAGNOSIS — M8580 Other specified disorders of bone density and structure, unspecified site: Secondary | ICD-10-CM

## 2020-01-26 DIAGNOSIS — I10 Essential (primary) hypertension: Secondary | ICD-10-CM

## 2020-01-26 DIAGNOSIS — N1832 Chronic kidney disease, stage 3b: Secondary | ICD-10-CM

## 2020-01-26 NOTE — Patient Instructions (Addendum)
Visit Information  Goals Addressed            This Visit's Progress   . Pharmacy Care Plan:       CARE PLAN ENTRY  Current Barriers:  . Chronic Disease Management support, education, and care coordination needs related to Hypertension and Chronic Kidney Disease   Hypertension BP Readings from Last 3 Encounters:  07/01/19 140/60  06/23/19 (!) 146/72  06/15/19 130/74   . Pharmacist Clinical Goal(s): o Over the next 120 days, patient will work with PharmD and providers to maintain BP goal <140/90 . Current regimen:  o Amlodipine 5mg  daily . Interventions: o Comprehensive medication review o Continue current therapy . Patient self care activities - Over the next 120 days, patient will: o Check BP if symptomatic, document, and provide at future appointments o Ensure daily salt intake < 2300 mg/day  CKD . Pharmacist Clinical Goal(s) o Over the next 120 days, patient will work with PharmD and providers to optimize medication regimen as related to current kidney function . Interventions: o Evaluated safety of all medications based off current renal function o Counseled on avoidance of NSAIDs o Encouraged her to continue to use lowest effective dose of prednisone possible . Patient self care activities - Over the next 120 days, patient will: o Continue to take all medications as directed o Contact PharmD or PCP with any medication related questions or concerns   Please see past updates related to this goal by clicking on the "Past Updates" button in the selected goal         The patient verbalized understanding of instructions, educational materials, and care plan provided today and agreed to receive a mailed copy of patient instructions, educational materials, and care plan.   Telephone follow up appointment with pharmacy team member scheduled for:4 months  , PharmD Clinical Pharmacist [facility] [phone]  Hypertension,  Adult Hypertension is another name for high blood pressure. High blood pressure forces your heart to work harder to pump blood. This can cause problems over time. There are two numbers in a blood pressure reading. There is a top number (systolic) over a bottom number (diastolic). It is best to have a blood pressure that is below 120/80. Healthy choices can help lower your blood pressure, or you may need medicine to help lower it. What are the causes? The cause of this condition is not known. Some conditions may be related to high blood pressure. What increases the risk?  Smoking.  Having type 2 diabetes mellitus, high cholesterol, or both.  Not getting enough exercise or physical activity.  Being overweight.  Having too much fat, sugar, calories, or salt (sodium) in your diet.  Drinking too much alcohol.  Having long-term (chronic) kidney disease.  Having a family history of high blood pressure.  Age. Risk increases with age.  Race. You may be at higher risk if you are African American.  Gender. Men are at higher risk than women before age 63. After age 35, women are at higher risk than men.  Having obstructive sleep apnea.  Stress. What are the signs or symptoms?  High blood pressure may not cause symptoms. Very high blood pressure (hypertensive crisis) may cause: ? Headache. ? Feelings of worry or nervousness (anxiety). ? Shortness of breath. ? Nosebleed. ? A feeling of being sick to your stomach (nausea). ? Throwing up (vomiting). ? Changes in how you see. ? Very bad chest pain. ? Seizures. How is this treated?  This condition is treated by making healthy lifestyle changes, such as: ? Eating healthy foods. ? Exercising more. ? Drinking less alcohol.  Your health care provider may prescribe medicine if lifestyle changes are not enough to get your blood pressure under control, and if: ? Your top number is above 130. ? Your bottom number is above 80.  Your  personal target blood pressure may vary. Follow these instructions at home: Eating and drinking   If told, follow the DASH eating plan. To follow this plan: ? Fill one half of your plate at each meal with fruits and vegetables. ? Fill one fourth of your plate at each meal with whole grains. Whole grains include whole-wheat pasta, brown rice, and whole-grain bread. ? Eat or drink low-fat dairy products, such as skim milk or low-fat yogurt. ? Fill one fourth of your plate at each meal with low-fat (lean) proteins. Low-fat proteins include fish, chicken without skin, eggs, beans, and tofu. ? Avoid fatty meat, cured and processed meat, or chicken with skin. ? Avoid pre-made or processed food.  Eat less than 1,500 mg of salt each day.  Do not drink alcohol if: ? Your doctor tells you not to drink. ? You are pregnant, may be pregnant, or are planning to become pregnant.  If you drink alcohol: ? Limit how much you use to:  0-1 drink a day for women.  0-2 drinks a day for men. ? Be aware of how much alcohol is in your drink. In the [place] one drink equals one 12 oz bottle of beer (355 mL), one 5 oz glass of wine (148 mL), or one 1 oz glass of hard liquor (44 mL). Lifestyle   Work with your doctor to stay at a healthy weight or to lose weight. Ask your doctor what the best weight is for you.  Get at least 30 minutes of exercise most days of the week. This may include walking, swimming, or biking.  Get at least 30 minutes of exercise that strengthens your muscles (resistance exercise) at least 3 days a week. This may include lifting weights or doing Pilates.  Do not use any products that contain nicotine or tobacco, such as cigarettes, e-cigarettes, and chewing tobacco. If you need help quitting, ask your doctor.  Check your blood pressure at home as told by your doctor.  Keep all follow-up visits as told by your doctor. This is important. Medicines  Take over-the-counter and  prescription medicines only as told by your doctor. Follow directions carefully.  Do not skip doses of blood pressure medicine. The medicine does not work as well if you skip doses. Skipping doses also puts you at risk for problems.  Ask your doctor about side effects or reactions to medicines that you should watch for. Contact a doctor if you:  Think you are having a reaction to the medicine you are taking.  Have headaches that keep coming back (recurring).  Feel dizzy.  Have swelling in your ankles.  Have trouble with your vision. Get help right away if you:  Get a very bad headache.  Start to feel mixed up (confused).  Feel weak or numb.  Feel faint.  Have very bad pain in your: ? Chest. ? Belly (abdomen).  Throw up more than once.  Have trouble breathing. Summary  Hypertension is another name for high blood pressure.  High blood pressure forces your heart to work harder to pump blood.  For most people, a normal blood pressure is less  than 120/80.  Making healthy choices can help lower blood pressure. If your blood pressure does not get lower with healthy choices, you may need to take medicine. This information is not intended to replace advice given to you by your health care provider. Make sure you discuss any questions you have with your health care provider. Document Revised: 10/01/2017 Document Reviewed: 10/01/2017 Elsevier Patient Education  2020 [employer].

## 2020-01-27 ENCOUNTER — Other Ambulatory Visit: Payer: Self-pay | Admitting: Family Medicine

## 2020-01-27 DIAGNOSIS — M353 Polymyalgia rheumatica: Secondary | ICD-10-CM

## 2020-01-28 ENCOUNTER — Other Ambulatory Visit: Payer: Self-pay | Admitting: Family Medicine

## 2020-01-28 DIAGNOSIS — M5136 Other intervertebral disc degeneration, lumbar region: Secondary | ICD-10-CM

## 2020-01-31 ENCOUNTER — Ambulatory Visit: Payer: Self-pay | Admitting: Pharmacist

## 2020-01-31 NOTE — Chronic Care Management (AMB) (Signed)
  Chronic Care Management   Follow Up Note   01/31/2020 Name: [name] MRN: [id number] DOB: [birth year]  Referred by: Donita Brooks, MD Reason for referral : No chief complaint on file.   [name] is a 86 y.o. year old female who is a primary care patient of Pickard, Priscille Heidelberg, MD. The CCM team was consulted for assistance with chronic disease management and care coordination needs.    Review of patient status, including review of consultants reports, relevant laboratory and other test results, and collaboration with appropriate care team members and the patient's provider was performed as part of comprehensive patient evaluation and provision of chronic care management services.    SDOH (Social Determinants of Health) assessments performed: No See Care Plan activities for detailed interventions related to [facility])     Outpatient Encounter Medications as of 01/31/2020  Medication Sig  . ALPRAZolam (XANAX) 0.5 MG tablet TAKE 1 TO 2 TABLETS BY MOUTH EVERY 8 HOURS AS NEEDED FOR ANXIETY  . amLODipine (NORVASC) 5 MG tablet Take 1 tablet by mouth once daily  . escitalopram (LEXAPRO) 10 MG tablet Take 1 tablet by mouth once daily  . gabapentin (NEURONTIN) 100 MG capsule TAKE 1 CAPSULE BY MOUTH THREE TIMES DAILY AS NEEDED CAN  BE  TAKEN  AS  NEEDED  FOR  NERVE  PAIN  . predniSONE (DELTASONE) 10 MG tablet Take 2 tablets (20 mg total) by mouth daily with breakfast.  . traMADol (ULTRAM) 50 MG tablet TAKE 1 TABLET BY MOUTH EVERY 8 HOURS AS NEEDED   Facility-Administered Encounter Medications as of 01/31/2020  Medication  . denosumab (PROLIA) injection 60 mg     Objective:  Contact patient as follow up to our conversation last week post consultation with PCP.  Goals Addressed   None     There are no care plans to display for this patient.  Patient aware of recommendation to increase prednisone to 12.5mg  daily.  Also provided her with the telephone number to [facility]  [facility]heumatology, which a referral was placed for for evaluation of PMR.  Patient to follow up with me in a few days if she has not gotten an appointment scheduled with them yet.  Plan:   The patient has been provided with contact information for the care management team and has been advised to call with any health related questions or concerns.   Willa Frater, PharmD Clinical Pharmacist [facility] [phone]

## 2020-02-01 NOTE — Telephone Encounter (Signed)
Ok to refill??  Last office visit 11/08/2019.  Last refill 12/23/2019.  Ok to add refills to prescription.?

## 2020-02-02 ENCOUNTER — Other Ambulatory Visit: Payer: Self-pay | Admitting: Family Medicine

## 2020-02-14 ENCOUNTER — Other Ambulatory Visit: Payer: Self-pay | Admitting: Family Medicine

## 2020-02-14 DIAGNOSIS — F5101 Primary insomnia: Secondary | ICD-10-CM

## 2020-02-14 NOTE — Telephone Encounter (Signed)
Ok to refill??   Last office visit 11/08/2019.  Last refill 10/14/2019, #3 refills.

## 2020-02-16 ENCOUNTER — Telehealth: Payer: Self-pay | Admitting: Pharmacist

## 2020-02-16 NOTE — Progress Notes (Addendum)
° ° °  Chronic Care Management Pharmacy Assistant   Name: [name]  MRN: [id number] DOB: Nov 09, 1931  Reason for Encounter:General Disease State Call  Patient Questions:  1.  Have you seen any other providers since your last visit? No.   2.  Any changes in your medicines or health? No.    PCP : Donita Brooks, MD   Their chronic conditions include: hypertension, CKD, osteopenia.  Office Visits: None since 01/31/20  Consults: None since 01/31/20  Allergies:   Allergies  Allergen Reactions   Pneumococcal Vaccines Other (See Comments)    Pt had localized reaction to injection of Pneumococcal 23 - Swelling and redness in upper arm that extend to but not beyond elbow    Medications: Outpatient Encounter Medications as of 02/16/2020  Medication Sig   traMADol (ULTRAM) 50 MG tablet TAKE 1 TABLET BY MOUTH EVERY 8 HOURS AS NEEDED   ALPRAZolam (XANAX) 0.5 MG tablet TAKE 1 TO 2 TABLETS BY MOUTH EVERY 8 HOURS AS NEEDED FOR ANXIETY   amLODipine (NORVASC) 5 MG tablet Take 1 tablet by mouth once daily   escitalopram (LEXAPRO) 10 MG tablet Take 1 tablet by mouth once daily   gabapentin (NEURONTIN) 100 MG capsule TAKE 1 CAPSULE BY MOUTH THREE TIMES DAILY AS NEEDED CAN  BE  TAKEN  AS  NEEDED  FOR  NERVE  PAIN   predniSONE (DELTASONE) 10 MG tablet Take 2 tablets (20 mg total) by mouth daily with breakfast.   Facility-Administered Encounter Medications as of 02/16/2020  Medication   denosumab (PROLIA) injection 60 mg    Current Diagnosis: Patient Active Problem List   Diagnosis Date Noted   Proximal weakness of extremity 02/25/2019   CKD (chronic kidney disease) stage 3, GFR 30-59 ml/min (HCC)    Unilateral primary osteoarthritis, left knee 09/16/2018   Thoracic compression fracture, sequela 09/16/2018   Hx of total hip arthroplasty, bilateral 09/16/2018   Bilateral primary osteoarthritis of knee 08/04/2018   Essential hypertension 07/08/2018   Osteopenia 09/13/2014   Fatty  liver disease, nonalcoholic     Goals Addressed   None    Patient stated she is currently taking Prednisone 12 mg. Patient stated she is unable to work far, she stated she can only walk a few min, she stated her bones feel weak and sore. Her pain level from a 1-10 scale she stated walking is a 8-10.   Patient was wondering if she needed to fill out another patient assistant application for her Prolia injection. Patent also stated she was wondering when her Alprazolam would be sent to the pharmacy I messaged Dr. Felisa Bonier nurse in regards of this.   Follow-Up:  Pharmacist Review   Hulen Luster, RMA Clinical Pharmacist Assistant [phone]  6 minutes spent in review, coordination, and documentation.  Reviewed by: Willa Frater, PharmD Clinical Pharmacist [facility] [phone]

## 2020-02-17 ENCOUNTER — Telehealth: Payer: Self-pay | Admitting: *Deleted

## 2020-02-17 NOTE — Telephone Encounter (Signed)
-----   Message from [name] sent at 02/16/2020  3:08 PM EST ----- Regarding: Refill Good Afternoon, I spoke with [name] and she stated she went to the pharmacy but her Xanax was not there but I do see that it says it was sent on the 10 th when you get a chance can you check on this please and thank you.

## 2020-02-17 NOTE — Telephone Encounter (Signed)
Call placed to pharmacy.   Prescription has been received and is available for pickup.   Call placed to patient and patient made aware.

## 2020-03-10 ENCOUNTER — Other Ambulatory Visit: Payer: Self-pay | Admitting: Family Medicine

## 2020-03-10 DIAGNOSIS — F5101 Primary insomnia: Secondary | ICD-10-CM

## 2020-03-10 NOTE — Telephone Encounter (Signed)
Ok to refill??  Last office visit 11/08/2019.  Last refill 02/14/2020.

## 2020-03-12 NOTE — Progress Notes (Deleted)
Office Visit Note  Patient: [name]             Date of Birth: 03/27/1931           MRN: [id number]             PCP: Susy Frizzle, MD Referring: Susy Frizzle, MD Visit Date: 03/13/2020 Occupation: @GUAROCC @  Subjective:  No chief complaint on file.   History of Present Illness: [name] is a 87 y.o. female with history of CKD stage 3b, NAFLD, osteoporosis with thoracic spine compression fracture, osteoarthritis s/p bilateral THA here for evaluation of PMR. She was started on prednisone for bilateral leg pain and weakness since 06/2019 with initial dose of 20mg  daily tapered down to 10mg . Extensive workup at that time including review of cervical and lumbar MRI within last 2 years and nerve studies were fairly unremarkable for causative structural problem. Inflammatory serology also unremarkable.***   Labs reviewed ESR wnl MyoMarker panel neg CK wnl TSH wnl MMA wnl RF neg  Imaging reviewed 03/2019 NCV bilateral axonal length dependent sensorimotor polyneuropathy, no myositis or radiculopathy indicated  Activities of Daily Living:  Patient reports morning stiffness for *** {minute/hour:19697}.   Patient {ACTIONS;DENIES/REPORTS:21021675::"Denies"} nocturnal pain.  Difficulty dressing/grooming: {ACTIONS;DENIES/REPORTS:21021675::"Denies"} Difficulty climbing stairs: {ACTIONS;DENIES/REPORTS:21021675::"Denies"} Difficulty getting out of chair: {ACTIONS;DENIES/REPORTS:21021675::"Denies"} Difficulty using hands for taps, buttons, cutlery, and/or writing: {ACTIONS;DENIES/REPORTS:21021675::"Denies"}  No Rheumatology ROS completed.   PMFS History:  Patient Active Problem List   Diagnosis Date Noted  . Proximal weakness of extremity 02/25/2019  . CKD (chronic kidney disease) stage 3, GFR 30-59 ml/min (HCC)   . Unilateral primary osteoarthritis, left knee 09/16/2018  . Thoracic compression fracture, sequela 09/16/2018  . Hx of total hip arthroplasty,  bilateral 09/16/2018  . Bilateral primary osteoarthritis of knee 08/04/2018  . Essential hypertension 07/08/2018  . Osteopenia 09/13/2014  . Fatty liver disease, nonalcoholic     Past Medical History:  Diagnosis Date  . Cataract   . CKD (chronic kidney disease) stage 3, GFR 30-59 ml/min (HCC)   . Fatty liver disease, nonalcoholic    4403- admitted with encephalopathy unsure of cause, resolved sponatneously  . Hypertension     Family History  Problem Relation Age of Onset  . Heart disease Mother   . Cancer Father        metastatic unsure of primary  . Early death Brother        died at 10 y/o  . Liver cancer Maternal Grandmother    Past Surgical History:  Procedure Laterality Date  . FRACTURE SURGERY     right shoulder, both wrists   . JOINT REPLACEMENT     HIP- bilateral   Social History   Social History Narrative   Lives alone   Right handed    Caffeine: 1/2-1 cup coffee in AM   Immunization History  Administered Date(s) Administered  . Fluad Quad(high Dose 65+) 12/01/2018, 11/08/2019  . Influenza, High Dose Seasonal PF 11/07/2016, 12/18/2017  . Influenza,inj,Quad PF,6+ Mos 12/27/2015  . PFIZER(Purple Top)SARS-COV-2 Vaccination 03/19/2019  . Pneumococcal Polysaccharide-23 07/12/2014     Objective: Vital Signs: There were no vitals taken for this visit.   Physical Exam   Musculoskeletal Exam: ***  CDAI Exam: CDAI Score: - Patient Global: -; Provider Global: - Swollen: -; Tender: - Joint Exam 03/13/2020   No joint exam has been documented for this visit   There is currently no information documented on the homunculus. Go to the Rheumatology activity and complete  the homunculus joint exam.  Investigation: No additional findings.  Imaging: No results found.  Recent Labs: Lab Results  Component Value Date   WBC 8.0 11/08/2019   HGB 13.1 11/08/2019   PLT 198 11/08/2019   NA 138 11/08/2019   K 4.3 11/08/2019   CL 103 11/08/2019   CO2 27  11/08/2019   GLUCOSE 107 (H) 11/08/2019   BUN 31 (H) 11/08/2019   CREATININE 1.25 (H) 11/08/2019   BILITOT 0.5 11/08/2019   ALKPHOS 69 08/13/2016   AST 17 11/08/2019   ALT 20 11/08/2019   PROT 6.1 11/08/2019   ALBUMIN 4.2 08/13/2016   CALCIUM 9.6 11/08/2019   GFRAA 45 (L) 11/08/2019    Speciality Comments: No specialty comments available.  Procedures:  No procedures performed Allergies: Pneumococcal vaccines   Assessment / Plan:     Visit Diagnoses: No diagnosis found.  Orders: No orders of the defined types were placed in this encounter.  No orders of the defined types were placed in this encounter.   Face-to-face time spent with patient was *** minutes. Greater than 50% of time was spent in counseling and coordination of care.  Follow-Up Instructions: No follow-ups on file.   Collier Salina, MD  Note - This record has been created using [employer].  Chart creation errors have been sought, but may not always  have been located. Such creation errors do not reflect on  the standard of medical care.

## 2020-03-13 ENCOUNTER — Ambulatory Visit: Payer: Medicare Other | Admitting: Internal Medicine

## 2020-03-30 ENCOUNTER — Other Ambulatory Visit: Payer: Self-pay | Admitting: Family Medicine

## 2020-03-30 DIAGNOSIS — F5101 Primary insomnia: Secondary | ICD-10-CM

## 2020-03-30 NOTE — Telephone Encounter (Signed)
Ok to refill??  Last office visit 11/08/2019.  Last refill 03/10/2020.

## 2020-04-03 ENCOUNTER — Telehealth: Payer: Self-pay | Admitting: Family Medicine

## 2020-04-03 MED ORDER — GABAPENTIN 100 MG PO CAPS: ORAL_CAPSULE | ORAL | 2 refills | Status: DC

## 2020-04-03 NOTE — Telephone Encounter (Signed)
Received a fax from [facility] requesting a refill on gabapentin (NEURONTIN) 100 MG capsule

## 2020-04-12 NOTE — Progress Notes (Signed)
Office Visit Note  Patient: [name]             Date of Birth: 1931/12/12           MRN: [id number]             PCP: Donita Brooks, MD Referring: Donita Brooks, MD Visit Date: 04/13/2020   Subjective:  New Patient (Initial Visit) (Patient complains of bilateral knee pain (with activity and walking) and swelling (left > right), generalized lower extremity weakness, neck pain, bilateral wrist/hand soreness. )   History of Present Illness: [name] is a 87 y.o. female here for polymyalgia rheumatica with long term use of prednisone. She has longstanding joint pain in multiple areas especially in her knees and legs but also affecting the neck and both hands. She originally had a profound improvement in her proximal leg strength and mobility with starting prednisone although still has a fair amount of symptoms related to underlying degenerative vertebral disease and knee osteoarthritis with deconditioning. Her symptoms are worst trying to rise from sitting or lying down, but also pain is exacerbated with prolonged standing and walking.  Activities of Daily Living:  Patient reports morning stiffness for 24 hours.   Patient Reports nocturnal pain.  Difficulty dressing/grooming: Denies Difficulty climbing stairs: Reports Difficulty getting out of chair: Reports Difficulty using hands for taps, buttons, cutlery, and/or writing: Denies  Review of Systems  Constitutional: Negative for fatigue.  HENT: Negative for mouth sores, mouth dryness and nose dryness.   Eyes: Negative for pain, itching, visual disturbance and dryness.  Respiratory: Negative for cough, hemoptysis, shortness of breath and difficulty breathing.   Cardiovascular: Positive for swelling in legs/feet. Negative for chest pain and palpitations.  Gastrointestinal: Positive for constipation and diarrhea. Negative for abdominal pain and blood in stool.  Endocrine: Negative for increased urination.   Genitourinary: Negative for painful urination.  Musculoskeletal: Positive for arthralgias, joint pain, joint swelling, myalgias, muscle weakness, morning stiffness, muscle tenderness and myalgias.  Skin: Positive for rash and redness. Negative for color change.  Allergic/Immunologic: Negative for susceptible to infections.  Neurological: Positive for numbness. Negative for dizziness, headaches, memory loss and weakness.  Hematological: Positive for bruising/bleeding tendency.  Psychiatric/Behavioral: Positive for sleep disturbance. Negative for confusion.    PMFS History:  Patient Active Problem List   Diagnosis Date Noted  . Bilateral wrist pain 05/11/2020  . Body mass index (BMI) 27.0-27.9, adult 05/04/2019  . Thoracic spondylosis with myelopathy 05/04/2019  . Proximal weakness of extremity 02/25/2019  . CKD (chronic kidney disease) stage 3, GFR 30-59 ml/min (HCC)   . Unilateral primary osteoarthritis, left knee 09/16/2018  . Thoracic compression fracture, sequela 09/16/2018  . Hx of total hip arthroplasty, bilateral 09/16/2018  . Bilateral primary osteoarthritis of knee 08/04/2018  . Essential hypertension 07/08/2018  . Osteoarthritis of right knee 05/25/2018  . Osteopenia 09/13/2014  . Fatty liver disease, nonalcoholic     Past Medical History:  Diagnosis Date  . Cataract   . CKD (chronic kidney disease) stage 3, GFR 30-59 ml/min (HCC)   . Fatty liver disease, nonalcoholic    2007- admitted with encephalopathy unsure of cause, resolved sponatneously  . Hypertension     Family History  Problem Relation Age of Onset  . Heart disease Mother   . Cancer Father        metastatic unsure of primary  . Early death Brother        died at 29 y/o  .  Liver cancer Maternal Grandmother   . Healthy Son    Past Surgical History:  Procedure Laterality Date  . FRACTURE SURGERY     right shoulder, both wrists   . JOINT REPLACEMENT     HIP- bilateral   Social History   Social  History Narrative   Lives alone   Right handed    Caffeine: 1/2-1 cup coffee in AM   Immunization History  Administered Date(s) Administered  . Fluad Quad(high Dose 65+) 12/01/2018, 11/08/2019  . Influenza, High Dose Seasonal PF 11/07/2016, 12/18/2017  . Influenza,inj,Quad PF,6+ Mos 12/27/2015  . PFIZER(Purple Top)SARS-COV-2 Vaccination 02/26/2019, 03/19/2019, 11/18/2019  . Pneumococcal Polysaccharide-23 07/12/2014     Objective: Vital Signs: BP 128/69 (BP Location: Right Arm, Patient Position: Sitting, Cuff Size: Normal)   Pulse 76   Ht 5\' 3"  (1.6 m)   Wt 156 lb (70.8 kg)   BMI 27.63 kg/m    Physical Exam HENT:     Mouth/Throat:     Mouth: Mucous membranes are moist.     Pharynx: Oropharynx is clear.  Eyes:     Conjunctiva/sclera: Conjunctivae normal.  Cardiovascular:     Rate and Rhythm: Normal rate and regular rhythm.  Pulmonary:     Effort: Pulmonary effort is normal.     Breath sounds: Normal breath sounds.  Skin:    General: Skin is warm and dry.     Findings: No rash.  Neurological:     General: No focal deficit present.     Mental Status: She is alert.     Deep Tendon Reflexes: Reflexes normal.  Psychiatric:        Mood and Affect: Mood normal.     Musculoskeletal Exam:  Neck full ROM no tenderness  Shoulders tenderness with end of abduction but no focal tenderness and has complete ROM Wrists reduced ROM no swelling Fingers full ROM few nodule changes no synovitis Bilateral paraspinal tenderness and spinous tenderness over lumbar region Hip lateral tenderness to Pace maneuver, good rotation ROM, normal strength intact Knees slightly reduced no swelling no laxity to varus or valgus pressure  Investigation: No additional findings.  Imaging: No results found.  Recent Labs: Lab Results  Component Value Date   WBC 8.7 04/19/2020   HGB 13.6 04/19/2020   PLT 188 04/19/2020   NA 141 04/19/2020   K 4.2 04/19/2020   CL 106 04/19/2020   CO2 27  04/19/2020   GLUCOSE 98 04/19/2020   BUN 30 (H) 04/19/2020   CREATININE 1.07 (H) 04/19/2020   BILITOT 0.5 11/08/2019   ALKPHOS 69 08/13/2016   AST 17 11/08/2019   ALT 20 11/08/2019   PROT 6.1 11/08/2019   ALBUMIN 4.2 08/13/2016   CALCIUM 9.8 04/19/2020   GFRAA 54 (L) 04/19/2020    Speciality Comments: No specialty comments available.  Procedures:  No procedures performed Allergies: Pneumococcal vaccines   Assessment / Plan:     Visit Diagnoses: Unilateral primary osteoarthritis, left knee Bilateral wrist pain- Plan: Ambulatory referral to Physical Therapy  Symptoms today are most consistent with arthritis both generalized osteoarthritis and may have some postraumatic secondary arthritis in the wrists. I do not see any inflammatory joint changes on exam and distribution of symptoms is not typical for PMR at least the current problems, previously may have improved. I do not recommend her long term use of prednisone side effects probably outweigh of benefit. Referral to PT for knee osteoarthritis evaluation and treatment.  Orders: Orders Placed This Encounter  Procedures  .  Ambulatory referral to Physical Therapy   No orders of the defined types were placed in this encounter.  Follow-Up Instructions: No follow-ups on file.   Fuller Plan, MD  Note - This record has been created using [employer].  Chart creation errors have been sought, but may not always  have been located. Such creation errors do not reflect on  the standard of medical care.

## 2020-04-13 ENCOUNTER — Encounter: Payer: Self-pay | Admitting: Internal Medicine

## 2020-04-13 ENCOUNTER — Other Ambulatory Visit: Payer: Self-pay

## 2020-04-13 ENCOUNTER — Ambulatory Visit (INDEPENDENT_AMBULATORY_CARE_PROVIDER_SITE_OTHER): Payer: Medicare Other | Admitting: Internal Medicine

## 2020-04-13 VITALS — BP 128/69 | HR 76 | Ht 63.0 in | Wt 156.0 lb

## 2020-04-13 DIAGNOSIS — M1712 Unilateral primary osteoarthritis, left knee: Secondary | ICD-10-CM

## 2020-04-13 DIAGNOSIS — M25532 Pain in left wrist: Secondary | ICD-10-CM

## 2020-04-13 DIAGNOSIS — M25531 Pain in right wrist: Secondary | ICD-10-CM | POA: Diagnosis not present

## 2020-04-14 ENCOUNTER — Telehealth: Payer: Self-pay | Admitting: Pharmacist

## 2020-04-14 NOTE — Progress Notes (Addendum)
Chronic Care Management Pharmacy Assistant   Name: THERSA MOHIUDDIN  MRN: [id number] DOB: 1931-03-14  Reason for Encounter: Disease State For HTN.   Conditions to be addressed/monitored:hypertension, CKD, osteopenia.  Recent office visits: None since 02/16/20  Recent consult visits:  04/13/20 Rheumatology. Fuller Plan, MD. For Osteoarthritis. No medication changes   Hospital visits:  None in previous 6 months  Medications: Outpatient Encounter Medications as of 04/14/2020  Medication Sig   ALPRAZolam (XANAX) 0.5 MG tablet Take 1 to 2 tablets by mouth every 8 hours as needed for anxiety (Patient taking differently: Take 1 mg by mouth at bedtime.)   amLODipine (NORVASC) 5 MG tablet Take 1 tablet by mouth every day   Camphor-Menthol-Methyl Sal (SALONPAS EX) Apply topically.   denosumab (PROLIA) 60 MG/ML SOSY injection Inject 60 mg into the skin every 6 (six) months.   diclofenac Sodium (VOLTAREN) 1 % GEL Apply topically 4 (four) times daily.   escitalopram (LEXAPRO) 10 MG tablet Take 1 tablet by mouth every day   gabapentin (NEURONTIN) 100 MG capsule TAKE 1 CAPSULE BY MOUTH THREE TIMES DAILY AS NEEDED CAN  BE  TAKEN  AS  NEEDED  FOR  NERVE  PAIN (Patient taking differently: TAKE 1 CAPSULE BY MOUTH THREE TIMES DAILY AS NEEDED CAN  BE  TAKEN  AS  NEEDED  FOR  NERVE  PAIN)   predniSONE (DELTASONE) 10 MG tablet Take 2 tablets by mouth every morning with breakfast   traMADol (ULTRAM) 50 MG tablet TAKE 1 TABLET BY MOUTH EVERY 8 HOURS AS NEEDED   Facility-Administered Encounter Medications as of 04/14/2020  Medication   denosumab (PROLIA) injection 60 mg   Reviewed chart prior to disease state call. Spoke with patient regarding BP  Recent Office Vitals: BP Readings from Last 3 Encounters:  04/13/20 128/69  11/08/19 (!) 144/60  07/01/19 140/60   Pulse Readings from Last 3 Encounters:  04/13/20 76  11/08/19 79  07/01/19 73    Wt Readings from Last 3 Encounters:  04/13/20  156 lb (70.8 kg)  11/08/19 156 lb 1.6 oz (70.8 kg)  07/01/19 149 lb (67.6 kg)     Kidney Function Lab Results  Component Value Date/Time   CREATININE 1.25 (H) 11/08/2019 11:15 AM   CREATININE 1.16 (H) 04/15/2019 09:19 AM   GFRNONAA 39 (L) 11/08/2019 11:15 AM   GFRAA 45 (L) 11/08/2019 11:15 AM    BMP Latest Ref Rng & Units 11/08/2019 04/15/2019 12/01/2018  Glucose 65 - 99 mg/dL 657(Q) 82 84  BUN 7 - 25 mg/dL 46(N) 20 23  Creatinine 0.60 - 0.88 mg/dL 6.29(B) 2.84(X) 3.24(M)  BUN/Creat Ratio 6 - 22 (calc) 25(H) 17 18  Sodium 135 - 146 mmol/L 138 139 140  Potassium 3.5 - 5.3 mmol/L 4.3 4.3 5.0  Chloride 98 - 110 mmol/L 103 103 106  CO2 20 - 32 mmol/L 27 26 24   Calcium 8.6 - 10.4 mg/dL 9.6 9.4 9.5    Current antihypertensive regimen:  Amlodipine 5 mg daily  How often are you checking your Blood Pressure? Patient stated she does not check her blood pressure at home. She stated she has a machine but she's allowing her grandson to use it at this time.    Current home BP readings: Patient stated she does not check her blood pressure.  What recent interventions/DTPs have been made by any provider to improve Blood Pressure control since last CPP Visit: None.  Any recent hospitalizations or ED visits since last visit  with CPP? Patient stated no.  What diet changes have been made to improve Blood Pressure Control?  She stated she uses her air fryer and she stated eats a well rounded diet sometimes but not all the time. She stated she doesn't have a good appetite but she does eat. She stated she eats vegetables and fruit. She stated she is trying to increase her water intake.   What exercise is being done to improve your Blood Pressure Control?  Patient stated she does all of her house chores. She stated she also does yard work when the weather permits. She stated she is still able to get out and drive.    Adherence Review: Is the patient currently on ACE/ARB medication? No.  Does the  patient have >5 day gap between last estimated fill dates? Per misc rpts, no.  Star Rating Drugs: None.  Patient was concerned about her medication delivery since she changed pharmacies, I called and received an updated delivery date for her. As well sent a message to Dr. Felisa Bonier nurse about her Prednisone 10 mg twice daily she stated that dose was not helpful to her. She stated all of her medications are within her budget.    Follow-Up:Pharmacist Review  Hulen Luster, RMA Clinical Pharmacist Assistant [phone]  9 minutes spent in review, coordination, and documentation.  Reviewed by: Willa Frater, PharmD Clinical Pharmacist [facility] [phone]

## 2020-04-18 ENCOUNTER — Telehealth: Payer: Self-pay | Admitting: *Deleted

## 2020-04-18 ENCOUNTER — Other Ambulatory Visit: Payer: Self-pay | Admitting: Family Medicine

## 2020-04-18 DIAGNOSIS — F5101 Primary insomnia: Secondary | ICD-10-CM

## 2020-04-18 MED ORDER — ALPRAZOLAM 0.5 MG PO TABS: 0.5000 mg | ORAL_TABLET | Freq: Three times a day (TID) | ORAL | 3 refills | Status: DC | PRN

## 2020-04-18 NOTE — Telephone Encounter (Signed)
Ok to refill Xanax?? Last office visit 11/08/2019. Last refill 03/30/2020 to mail order.

## 2020-04-18 NOTE — Telephone Encounter (Signed)
I need to see her to check sed rate 1st.

## 2020-04-18 NOTE — Telephone Encounter (Signed)
-----   Message from Raquel Sarna sent at 04/18/2020 11:44 AM EDT ----- Regarding: Medication Good morning I spoke with the patient this morning and she stated she's been trying to speak to someone about her prednisone 10 mg twice a day she stated this dose does not help her any and would like to go back up a dose If she could, please advise?

## 2020-04-18 NOTE — Telephone Encounter (Signed)
Call placed to patient. States that she has decreased Prednisone to 10mg  PO QD:   I believe the patient's problem is a combination of PMR coupled with osteoarthritis coupled with deconditioning coupled with polyneuropathy.  Regardless she is doing dramatically better on prednisone 20 mg a day.  We explained the long-term risk of this however I would like to try to wean her to the lowest effective dose possible.  Therefore we will decrease her prednisone to 15 mg a day.  We will continue to decrease the prednisone slowly as tolerated.  She will call me back monthly and let me know how she is doing.  The neck step will be to decrease to 10 mg a day.  We will need to slow the taper down dramatically at that point.  States that she is trying to ge tot the lowest effective dose, but she is unable to function on 10mg  PO QD. Requested to go back to 15mg  PO QD.   Please advise.

## 2020-04-18 NOTE — Telephone Encounter (Signed)
Call placed to patient and patient made aware.   Appointment scheduled.  

## 2020-04-18 NOTE — Telephone Encounter (Signed)
Patient needs xanax called in she has been out and she hasn't been sleeping she uses [facility] on [address] [place] and she is also has questions about her prednisone.  CB# [phone]

## 2020-04-19 ENCOUNTER — Encounter: Payer: Self-pay | Admitting: Family Medicine

## 2020-04-19 ENCOUNTER — Other Ambulatory Visit: Payer: Self-pay

## 2020-04-19 ENCOUNTER — Ambulatory Visit (INDEPENDENT_AMBULATORY_CARE_PROVIDER_SITE_OTHER): Payer: Medicare Other | Admitting: Family Medicine

## 2020-04-19 VITALS — BP 122/74 | HR 80 | Temp 98.1°F | Resp 16 | Ht 63.0 in | Wt 157.0 lb

## 2020-04-19 DIAGNOSIS — M353 Polymyalgia rheumatica: Secondary | ICD-10-CM | POA: Diagnosis not present

## 2020-04-19 DIAGNOSIS — N1832 Chronic kidney disease, stage 3b: Secondary | ICD-10-CM

## 2020-04-19 LAB — CBC WITH DIFFERENTIAL/PLATELET
Absolute Monocytes: 461 cells/uL (ref 200–950)
Basophils Absolute: 26 cells/uL (ref 0–200)
Basophils Relative: 0.3 %
Eosinophils Absolute: 9 cells/uL — ABNORMAL LOW (ref 15–500)
Eosinophils Relative: 0.1 %
HCT: 41.6 % (ref 35.0–45.0)
Hemoglobin: 13.6 g/dL (ref 11.7–15.5)
Lymphs Abs: 983 cells/uL (ref 850–3900)
MCH: 30.8 pg (ref 27.0–33.0)
MCHC: 32.7 g/dL (ref 32.0–36.0)
MCV: 94.3 fL (ref 80.0–100.0)
MPV: 10.4 fL (ref 7.5–12.5)
Monocytes Relative: 5.3 %
Neutro Abs: 7221 cells/uL (ref 1500–7800)
Neutrophils Relative %: 83 %
Platelets: 188 10*3/uL (ref 140–400)
RBC: 4.41 10*6/uL (ref 3.80–5.10)
RDW: 12.7 % (ref 11.0–15.0)
Total Lymphocyte: 11.3 %
WBC: 8.7 10*3/uL (ref 3.8–10.8)

## 2020-04-19 LAB — BASIC METABOLIC PANEL WITH GFR
BUN/Creatinine Ratio: 28 (calc) — ABNORMAL HIGH (ref 6–22)
BUN: 30 mg/dL — ABNORMAL HIGH (ref 7–25)
CO2: 27 mmol/L (ref 20–32)
Calcium: 9.8 mg/dL (ref 8.6–10.4)
Chloride: 106 mmol/L (ref 98–110)
Creat: 1.07 mg/dL — ABNORMAL HIGH (ref 0.60–0.88)
GFR, Est African American: 54 mL/min/{1.73_m2} — ABNORMAL LOW (ref >=60)
GFR, Est Non African American: 47 mL/min/{1.73_m2} — ABNORMAL LOW (ref >=60)
Glucose, Bld: 98 mg/dL (ref 65–99)
Potassium: 4.2 mmol/L (ref 3.5–5.3)
Sodium: 141 mmol/L (ref 135–146)

## 2020-04-19 LAB — SEDIMENTATION RATE: Sed Rate: 2 mm/h (ref 0–30)

## 2020-04-19 NOTE — Progress Notes (Signed)
Subjective:    Patient ID: Erasmo ScoreBetty L Hoge, female    DOB: 06/06/1931, 87 y.o.   MRN: [id number]  HPI 9/19 Patient presents today complaining of elevated blood pressure.  She states that over the weekend, her blood pressure was as high as 170 systolic over 90 diastolic.  It has been steadily rising over the last few weeks.  She denies any chest pain shortness of breath or dyspnea on exertion.  She became alarmed and therefore this weekend she took 1 of her old blood pressure medications, losartan HCTZ.  Her blood pressure today is well controlled however she has taken her old medication twice over the weekend.  We previously discontinued the medication due to acute kidney injury in the early summer with a rise in her creatinine to 1.73 as well as an elevated potassium of 5.9.  Even after discontinuing losartan HCTZ, her potassium remain borderline elevated at 5.2.  Patient also complains of knee pain and ankle pain.  Early this summer, she tripped over an exposed pipe trading from the wall of her home striking her right shin as she walked by.  She fell on her lateral right ankle as well as her right knee.  Ever since that time, she complains of pain and stiffness in the right knee particularly in the posterior lateral aspect of the right knee as well as swelling behind the knee in the popliteal fossa.  She also complains of pain distal and anterior to the lateral malleolus in the area of the anterior talofibular ligament.  Initially she can barely put weight on her foot however over the last few weeks, the pain has improved and subsided.  She still has pain with ambulation.  She also reports subjective swelling around the right knee.  At that time, my plan was: DC losartan HCTZ due to her history of acute kidney injury as well as hyperkalemia.  Replace with amlodipine 5 mg a day and monitor her blood pressure.  I believe the pain in her right knee is likely due to the fall, osteoarthritis, and possibly  a lateral meniscal tear although the exam is not specific today.  She cannot tolerate NSAIDs due to her mild chronic kidney disease.  Therefore I offered the patient a cortisone injection in the right knee.  She politely declined as the pain is not that severe.  I believe the pain in her right ankle is likely due to a sprain in the anterior talofibular ligament.  This is slowly improving.  I recommended the patient wear an ASO brace with ambulation to provide more ankle support and reassess in 2 to 3 weeks or sooner if worsening.  Patient is bearing weight without any difficulty and therefore I believe the fracture is unlikely.  12/01/18 Patient presents today complaining of problems in both her back and her legs.  She saw my partner in June who obtained a CT scan of the lumbar spine.  The results of the CT scan are included below:  IMPRESSION: Mild inferior endplate compression fracture of T11 and superior endplate compression fracture of T12 appear late subacute to remote. The fractures are most in keeping with senile osteoporotic/posttraumatic injuries.  Calcified right subarticular recess and foraminal protrusion at T11-12 could impact the exiting right T11 and descending right T12 roots.  Scattered foraminal narrowing appears worst at L3-4 where it is moderate to moderately severe and greater on the right.  Patient was referred to orthopedic surgery.  They have recommended bilateral knee replacements due  to osteoarthritis in the knees.  However today the patient's history seems confusing to me.  She denies any knee pain.  She does report audible crepitus in the knee however the majority of the pain is in her lower back.  She also complains of severe weakness in the legs.  Muscle strength is 5/5 equal and symmetric with knee extension and knee flexion as well as ankle dorsiflexion and ankle plantarflexion however hip flexion is extremely weak and is at best 3/5.  Hip extension is also weak  and is at best 4/5.  I am also unable to elicit any reflexes in either knee at the patella.  Patient states that as soon she stands her legs feel extremely weak.  I performed time stand up and go test.  The patient can only push herself to a standing position using her arms.  Her legs wobble and sway the entire time as if she is barely able to support her weight.  I believe that deconditioning has led to severe muscle atrophy in the proximal hip muscles.  Patient reports that she is also falling and feels unsteady on her feet.  She is having to use a walker.  However the majority of the pains in her lower back.  At that time, my plan was: Patient has significant leg weakness.  Some of this could be due to deconditioning however I am concerned about possible neurogenic claudication.  I have recommended physical therapy consultation at home to try to improve the strength in her proximal muscles in her legs to reduce her risk of following.  Meanwhile I will start the patient on a prednisone taper pack for what I suspect a spinal stenosis.  Recheck the patient in 1 week.  If the pain in her lower back is improving I would recommend an MRI of her back at that time to determine if she would benefit from epidural steroid injections and to determine the level where she would benefit the most.  Her exam today suggest more of spinal stenosis with resultant leg weakness.  12/08/18  Patient presents today for follow-up.  She saw some improvement on the prednisone perhaps 25% improvement.  She reports good days and bad days however today is a bad day.  She continues to report severe pain in the center of her back.  She also continues to demonstrate proximal leg muscle weakness.  She has barely able to stand from a seated position and this is only with holding onto a walker.  She is now having to use a walker simply to walk.  She does not feel deconditioning is causing this because she states that she is active every day  cleaning her house and using a leaf blower however she progressively is getting weaker and weaker and is requiring a walker now to walk.  She also reports some numbness and burning in both feet.  Both legs frequently give out on her and she has fallen several times.  I am concerned about neurogenic claudication.  She also continues to have severe pain in both knees secondary to osteoarthritis of the knees however my biggest concern is her progressive muscle weakness in both legs.  At that time, my plan was: Leg weakness seems to be progressively worsening.  I am concerned about neurogenic claudication.  She has yet to hear from physical therapy however she is falling more frequently and is now having to use a walker simply to get out of a chair.  Therefore I will  proceed with an MRI of the lumbar spine to evaluate further.  If there is significant lumbar spinal stenosis, I would recommend a referral to neurosurgery to discuss treatment options or perhaps epidural steroid injections particular to improve her leg weakness.  Patient was also recommended not to take any further NSAIDs as her lab work shows chronic kidney disease and the NSAIDs are likely exacerbating this.  She can use tramadol.  Await the results of the MRI  12/17/18 MRI revealed:  L3-4: 5 mm retrolisthesis with disc and facet degeneration. Diffuse endplate spurring. Severe subarticular and foraminal stenosis on the right. Moderate subarticular and foraminal stenosis on the left. Spinal canal adequate in size  L4-5: Disc degeneration and spurring. Moderate subarticular stenosis on the right due to spurring  L5-S1: Disc degeneration with endplate spurring. Moderate subarticular and foraminal stenosis bilaterally due to spurring.  Patient is here today with her son to discuss the findings.  Her biggest concern is her inability to walk.  She states that she has a difficult time standing from a seated position due to the weakness in her  legs.  When she does stand, her legs feel extremely wobbly and feel like they are going to give way on her.  After standing for a short period of time she has to sit down because of the weakness in her legs particularly the proximal muscles in the quadriceps and hamstrings bilaterally.  She also has significant pain in both knees however her leg weakness and low back pain seem to be her bigger issues.  At that time, my plan was: Patient has severe subarticular stenosis at L3-L4 and moderate subarticular and foraminal stenosis at multiple other levels.  I question if she would benefit from possible epidural steroid injections in the back.  There may be an element of nerve compression due to the foraminal stenosis that could be causing lumbosacral radiculopathy and leg weakness.  If this can help improve her low back pain and improve her leg weakness, the patient would certainly be willing to consider it.  Therefore I will consult Dr. Ethelene Hal at [facility] to evaluate for this.   04/15/19 Patient has since seen neurology.  They have evaluated the patient for neuromuscular diseases.  They have found no evidence of neuromuscular diseases.  Specifically EMG/nerve conduction studies did show length dependent axonal polyneuropathy consistent with peripheral neuropathy.  However there was no evidence of any myositis or muscular disease.  Patient does have some burning neuropathic pain in both feet.  This tends to get worse later in the day.  She would like something that she could take for neuropathy.  However she is also taking tramadol to 3 and sometimes 4 times a day for the pain in her knees and the pain in her back.  She is unable to take NSAIDs due to her chronic kidney disease.  Today on exam, the patient is unable to stand from a seated position despite holding onto her walker and pushing off on the walker.  I would literally have to help the patient stand in a process that takes roughly 15 to 20 seconds with  assistance.  Her proximal muscle strength is extremely weak and her quads and hamstrings.  She recently had an MRI of the brain and cervical spine however she has not received results yet from the neurologist.  She also had extensive lab work to evaluate for neuromuscular diseases which are normal.  Patient would benefit dramatically from a lift chair.  She  lives independently and has an extremely difficult time even getting to a standing position.  She is at high risk for falls due to this.  Therefore I have strongly recommended a lift chair.  At that time, my plan was: Patient would like to try gabapentin 100 mg p.o. every 8 hours as needed nerve pain in her legs and feet.  I explained to the patient that this medication can cause dizziness sleepiness.  Therefore I want her to use it sparingly and slowly uptitrate on the medication.  Also cautioned the patient not to use this medication with Xanax or to use the medication with tramadol.  She can use them separately but not at the same time.  We can uptitrate the dose of the gabapentin if she tolerates it to 300 mg 3 times a day once she is adjusted to it.  However I would do this slowly to avoid polypharmacy and falls.  I also have strongly recommended a lift chair.  Patient has severe arthritis in her back and also in her knees.  She also has profound muscle weakness in her quadriceps and hamstrings bilaterally.  She is unable to stand from a seated position despite my assistance.  I literally had to pull the patient to a standing position over a period of 15 to 20 seconds.  However once she is standing she is able to ambulate using a walker.  Therefore I believe that she would benefit from a lift seat mechanism to help her get to a standing position from the seated position in her chair.  She is already seen orthopedics and has tried cortisone injections with no relief and has had an extensive work-up under neurology showing no evidence of any underlying muscle  issues however she does have peripheral neuropathy.  She has tried physical therapy as well as medication with minimal benefit.  06/15/19 Since I last saw the patient, she has seen neurology.  Neurologist performed a very thorough evaluation for underlying muscle disease such as myositis, PMR, myasthenia gravis.  She also performed evaluation including nerve conduction studies with EMGs that were normal.  MRI of the lumbar spine shows no significant spinal stenosis.  MRI of the cervical spine shows moderate cervical spinal stenosis however she is seen a neurosurgeon who did not recommend surgery as he did not feel the patient is impacting her legs.  She also reports that she has had an MRI of the thoracic spine at the neurosurgeons office that showed no significant spinal stenosis that would cause underlying leg weakness.  She states the majority of her pain is in her knees.  However she also reports pain in her knees down to her toes.  She states that her legs ache and crawl.  As long as she is sitting still, she has no pain however as soon as she stands or tries to fall she developed severe pain in her knees lower legs and in her feet.  She also has diffuse leg weakness specifically in her hip flexors and hip extensors.  She is unable to stand from a chair without using a walker to push herself to a standing position using her arms.  This requires great effort in case more than 10 seconds for her to stand up.  She states that she is miserable.  She seems very frustrated.  She states that no one is giving her any explanation for what is going on.  She states that she is tired of bouncing from doctor to doctor  without any explanation.  At that time, my plan was: Patient is very frustrated.  I have reviewed lab work from neurology.  I appreciate all of their help.  A very thorough work-up revealed no specific pathologic issue other than polyneuropathy.  Patient has not been taking gabapentin 100 mg 3 times a day.   She has not been taking it all.  I would like her to add gabapentin 100 mg 3 times a day as I believe some of the pain distal to her knees in her calves and in her feet is likely neuropathic pain.  Her pain is out of proportion to her diagnostic work-up today and therefore I believe this must be neuropathic in nature.  However I also believe it is possible that she may be demonstrating PMR given her proximal muscle weakness.  Even though her sed rate was normal at the neurologist office I would like to try the patient on prednisone 20 mg a day empirically for possible PMR.  I would like to see the patient back in 2 weeks to see if the combination of prednisone and gabapentin has provided her any relief.  Recheck sooner if worse.  07/01/19 Patient states that she is almost 100% better!  She still has a difficult time standing from a seated position however she is able to do this today on first attempt with only mild effort whereas before she could barely stand despite holding onto the chair and a walker.  She still has proximal muscle weakness however the rapid improvement on prednisone makes me suspect involvement of PMR.  Also believe she likely has arthritis and the prednisone was treating this.  We had to avoid NSAIDs due to chronic kidney disease.  Whether his arthritis or PMR, the patient has dramatically responded to prednisone 20 mg a day.  We spent the remainder of our visit today discussing the risk of the prednisone.  I explained that it will make osteoporosis worse.  A week of her immune system.  I will raise her blood sugar.  Will cause weight gain.  However the patient states that she would rather take this pill and die a year or sooner for continued pain that she was experiencing before.  I would like to try to wean her to the lowest effective dose.  At that time, my plan was: I believe the patient's problem is a combination of PMR coupled with osteoarthritis coupled with deconditioning coupled with  polyneuropathy.  Regardless she is doing dramatically better on prednisone 20 mg a day.  We explained the long-term risk of this however I would like to try to wean her to the lowest effective dose possible.  Therefore we will decrease her prednisone to 15 mg a day.  We will continue to decrease the prednisone slowly as tolerated.  She will call me back monthly and let me know how she is doing.  The neck step will be to decrease to 10 mg a day.  We will need to slow the taper down dramatically at that point.  11/08/19 Patient is here today for a checkup.  She states that she feels great on 10 mg of prednisone.  When she tries to drop below 10 mg of prednisone she develops weakness and pain in her legs.  Especially in her proximal muscles.  However on 10 mg of prednisone she is able to stand up and the pain in her back and in her knees and in her legs improves.  She continues to  have severe pain in her left knee which is due to osteoarthritis which does not improve on the prednisone but overall she is doing well on 10 mg of prednisone.  She is due to recheck her lab work today to monitor her kidney disease.  However her biggest concern today is bruising.  She states that recently she has been bruising very easily on the dorsums of both arms as well as on her shins.  Patient has large purpura on her shins and numerous small petechiae and purpura on the dorsums of both forearms.  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  I will check a CBC to evaluate for thrombocytopenia.  I will check a CMP to monitor for any liver dysfunction and I will check a PT/INR to evaluate for any clotting factor abnormalities.  As long as this is normal, I simply reassured the patient that these are benign.  Patient's PMR is stable on 10 mg of prednisone however she is unable to wean below 10 mg of prednisone.  We discussed seeing a rheumatologist to possibly  switch to methotrexate however at the present time the patient has no interest in doing this and prefers to stay on 10 mg of prednisone despite the increased risk.  Patient received her flu shot today.  I will check a CMP to monitor his kidney function.  04/19/20 She called earlier this week requesting to increase her prednisone to 15 mg a day.  She states that the prednisone has never helped much.  This is an direct contraindication to what she is told me in the past.  She states that her back hurts and she is not able to go and do.  She states that the best medicine she ever took with the diclofenac that I stop due to her elevated creatinine.  Her biggest pain is primarily in her back although she does complain of pain in both shoulders.  She denies any pain in her knees.  She denies any pain in her ankles.  She does report weakness in her legs and weakness in her thighs.  Work-up has been well documented above  Past Medical History:  Diagnosis Date  . Cataract   . CKD (chronic kidney disease) stage 3, GFR 30-59 ml/min (HCC)   . Fatty liver disease, nonalcoholic    2007- admitted with encephalopathy unsure of cause, resolved sponatneously  . Hypertension    Past Surgical History:  Procedure Laterality Date  . FRACTURE SURGERY     right shoulder, both wrists   . JOINT REPLACEMENT     HIP- bilateral   Current Outpatient Medications on File Prior to Visit  Medication Sig Dispense Refill  . ALPRAZolam (XANAX) 0.5 MG tablet Take 1-2 tablets (0.5-1 mg total) by mouth 3 (three) times daily as needed for anxiety. 60 tablet 3  . amLODipine (NORVASC) 5 MG tablet Take 1 tablet by mouth every day 30 tablet 11  . Camphor-Menthol-Methyl Sal (SALONPAS EX) Apply topically.    Marland Kitchen denosumab (PROLIA) 60 MG/ML SOSY injection Inject 60 mg into the skin every 6 (six) months.    . diclofenac Sodium (VOLTAREN) 1 % GEL Apply topically 4 (four) times daily.    Marland Kitchen escitalopram (LEXAPRO) 10 MG tablet Take 1 tablet by  mouth every day 30 tablet 11  . gabapentin (NEURONTIN) 100 MG capsule TAKE 1 CAPSULE BY MOUTH THREE TIMES DAILY AS NEEDED CAN  BE  TAKEN  AS  NEEDED  FOR  NERVE  PAIN (Patient taking differently: TAKE 1 CAPSULE BY MOUTH THREE TIMES DAILY AS NEEDED CAN  BE  TAKEN  AS  NEEDED  FOR  NERVE  PAIN) 90 capsule 2  . predniSONE (DELTASONE) 10 MG tablet Take 2 tablets by mouth every morning with breakfast (Patient taking differently: Take 10 mg by mouth daily with breakfast.) 60 tablet 11  . traMADol (ULTRAM) 50 MG tablet TAKE 1 TABLET BY MOUTH EVERY 8 HOURS AS NEEDED 60 tablet 3   Current Facility-Administered Medications on File Prior to Visit  Medication Dose Route Frequency Provider Last Rate Last Admin  . denosumab (PROLIA) injection 60 mg  60 mg Subcutaneous Q6 months Donita Brooks, MD   60 mg at 07/09/19 0800   Allergies  Allergen Reactions  . Pneumococcal Vaccines Other (See Comments)    Pt had localized reaction to injection of Pneumococcal 23 - Swelling and redness in upper arm that extend to but not beyond elbow   Social History   Socioeconomic History  . Marital status: Widowed    Spouse name: Not on file  . Number of children: 1  . Years of education: Not on file  . Highest education level: Not on file  Occupational History  . Not on file  Tobacco Use  . Smoking status: Never Smoker  . Smokeless tobacco: Never Used  Vaping Use  . Vaping Use: Never used  Substance and Sexual Activity  . Alcohol use: Not Currently  . Drug use: Never  . Sexual activity: Not Currently    Comment: raised tobacco, retired  Other Topics Concern  . Not on file  Social History Narrative   Lives alone   Right handed    Caffeine: 1/2-1 cup coffee in AM   Social Determinants of Health   Financial Resource Strain: Low Risk   . Difficulty of Paying Living Expenses: Not very hard  Food Insecurity: Not on file  Transportation Needs: Not on file  Physical Activity: Not on file  Stress: Not on  file  Social Connections: Not on file  Intimate Partner Violence: Not on file     Review of Systems  All other systems reviewed and are negative.      Objective:   Physical Exam Constitutional:      Appearance: She is well-developed.  Cardiovascular:     Rate and Rhythm: Normal rate and regular rhythm.     Heart sounds: Normal heart sounds.  Pulmonary:     Effort: Pulmonary effort is normal. No respiratory distress.     Breath sounds: Normal breath sounds. No stridor. No wheezing or rales.  Musculoskeletal:     Lumbar back: Tenderness and bony tenderness present. Decreased range of motion.     Right knee: Decreased range of motion. No tenderness. No MCL or LCL tenderness. No MCL laxity.     Left knee: Decreased range of motion. No tenderness.           Assessment & Plan:  PMR (polymyalgia rheumatica) (HCC) - Plan: CBC with Differential/Platelet, BASIC METABOLIC PANEL WITH GFR, Sedimentation rate  Stage 3b chronic kidney disease (HCC) - Plan: CBC with Differential/Platelet, BASIC METABOLIC PANEL WITH GFR, Sedimentation rate  I truly believe the patient is dealing with osteoarthritis and degenerative joint disease perhaps some PMR.  I explained to the patient that we do not want to just increase prednisone due to the potential side effects.  She has gained substantial weight and  I believe this is due to the prednisone although she questions the accuracy of my scales.  Therefore I will check a sed rate.  If her sed rate is normal I would not increase the prednisone.  I will check her renal function.  If her renal function has improved off the NSAIDs we may try adding a low-dose diclofenac back 2 to 3 days a week.  However I cautioned the patient about the increased risk of progression of chronic kidney disease due to diclofenac.  Unfortunately I believe some of his pain is simply a situation the patient will have to live with.  This is very frustrating for her and I can certainly  understand that.

## 2020-04-30 ENCOUNTER — Other Ambulatory Visit: Payer: Self-pay | Admitting: Family Medicine

## 2020-04-30 DIAGNOSIS — M5136 Other intervertebral disc degeneration, lumbar region: Secondary | ICD-10-CM

## 2020-05-02 NOTE — Telephone Encounter (Signed)
Ok to refill??  Last office visit 04/19/2020.  Last refill 02/01/2020, #3 refills.

## 2020-05-03 ENCOUNTER — Other Ambulatory Visit: Payer: Self-pay | Admitting: Family Medicine

## 2020-05-03 DIAGNOSIS — M5136 Other intervertebral disc degeneration, lumbar region: Secondary | ICD-10-CM

## 2020-05-11 DIAGNOSIS — M25531 Pain in right wrist: Secondary | ICD-10-CM | POA: Insufficient documentation

## 2020-05-26 NOTE — Progress Notes (Incomplete)
Chronic Care Management Pharmacy Note  05/26/2020 Name:  [name] MRN:  [id number] DOB:  02-11-1931  Subjective: [name] is an 87 y.o. year old female who is a primary patient of Pickard, Cammie Mcgee, MD.  The CCM team was consulted for assistance with disease management and care coordination needs.    Engaged with patient by telephone for follow up visit in response to provider referral for pharmacy case management and/or care coordination services.   Consent to Services:  The patient was given the following information about Chronic Care Management services today, agreed to services, and gave verbal consent: 1. CCM service includes personalized support from designated clinical staff supervised by the primary care provider, including individualized plan of care and coordination with other care providers 2. 24/7 contact phone numbers for assistance for urgent and routine care needs. 3. Service will only be billed when office clinical staff spend 20 minutes or more in a month to coordinate care. 4. Only one practitioner may furnish and bill the service in a calendar month. 5.The patient may stop CCM services at any time (effective at the end of the month) by phone call to the office staff. 6. The patient will be responsible for cost sharing (co-pay) of up to 20% of the service fee (after annual deductible is met). Patient agreed to services and consent obtained.  Patient Care Team: Susy Frizzle, MD as PCP - General (Family Medicine) Edythe Clarity, [facility] as Pharmacist (Pharmacist)  Recent office visits: 04/19/2020 Dennard Schaumann) - patient dealing with pain, do not want to increase prednisone due to CKD.  PCP ok'd diclofenac 2 to 3 times per week at a maximum.  Recent consult visits: None since last CCM visit  Hospital visits: None in previous 6 months  Objective:  Lab Results  Component Value Date   CREATININE 1.07 (H) 04/19/2020   BUN 30 (H) 04/19/2020   GFRNONAA  47 (L) 04/19/2020   GFRAA 54 (L) 04/19/2020   NA 141 04/19/2020   K 4.2 04/19/2020   CALCIUM 9.8 04/19/2020   CO2 27 04/19/2020   GLUCOSE 98 04/19/2020    No results found for: HGBA1C, FRUCTOSAMINE, GFR, MICROALBUR  Last diabetic Eye exam: No results found for: HMDIABEYEEXA  Last diabetic Foot exam: No results found for: HMDIABFOOTEX   Lab Results  Component Value Date   CHOL 195 06/05/2017   HDL 44 (L) 06/05/2017   LDLCALC 129 (H) 06/05/2017   TRIG 116 06/05/2017   CHOLHDL 4.4 06/05/2017    Hepatic Function Latest Ref Rng & Units 11/08/2019 02/25/2019 12/01/2018  Total Protein 6.1 - 8.1 g/dL 6.1 6.5 6.3  Albumin 3.6 - 5.1 g/dL - - -  AST 10 - 35 U/L 17 - 18  ALT 6 - 29 U/L 20 - 14  Alk Phosphatase 33 - 130 U/L - - -  Total Bilirubin 0.2 - 1.2 mg/dL 0.5 - 0.5    Lab Results  Component Value Date/Time   TSH 0.703 02/25/2019 01:16 PM   TSH 2.05 06/05/2017 08:39 AM    CBC Latest Ref Rng & Units 04/19/2020 11/08/2019 02/25/2019  WBC 3.8 - 10.8 Thousand/uL 8.7 8.0 5.5  Hemoglobin 11.7 - 15.5 g/dL 13.6 13.1 14.1  Hematocrit 35.0 - 45.0 % 41.6 39.0 43.1  Platelets 140 - 400 Thousand/uL 188 198 253    No results found for: VD25OH  Clinical ASCVD: {YES/NO:21197} The ASCVD Risk score Mikey Bussing DC Jr., et al., 2013) failed to calculate for the following  reasons:   The 2013 ASCVD risk score is only valid for ages 30 to 61    Depression screen PHQ 2/9 11/08/2019 10/31/2016  Decreased Interest 0 3  Down, Depressed, Hopeless 0 3  PHQ - 2 Score 0 6  Altered sleeping - 1  Tired, decreased energy - 3  Change in appetite - 2  Feeling bad or failure about yourself  - 1  Trouble concentrating - 3  Moving slowly or fidgety/restless - 0  Suicidal thoughts - 1  PHQ-9 Score - 17  Difficult doing work/chores - Very difficult     ***Other: (CHADS2VASc if Afib, MMRC or CAT for COPD, ACT, DEXA)  Social History   Tobacco Use  Smoking Status Never Smoker  Smokeless Tobacco Never Used    BP Readings from Last 3 Encounters:  04/19/20 122/74  04/13/20 128/69  11/08/19 (!) 144/60   Pulse Readings from Last 3 Encounters:  04/19/20 80  04/13/20 76  11/08/19 79   Wt Readings from Last 3 Encounters:  04/19/20 157 lb (71.2 kg)  04/13/20 156 lb (70.8 kg)  11/08/19 156 lb 1.6 oz (70.8 kg)   BMI Readings from Last 3 Encounters:  04/19/20 27.81 kg/m  04/13/20 27.63 kg/m  11/08/19 28.55 kg/m    Assessment/Interventions: Review of patient past medical history, allergies, medications, health status, including review of consultants reports, laboratory and other test data, was performed as part of comprehensive evaluation and provision of chronic care management services.   SDOH:  (Social Determinants of Health) assessments and interventions performed: {yes/no:20286}  SDOH Screenings   Alcohol Screen: Not on file  Depression (PHQ2-9): Low Risk   . PHQ-2 Score: 0  Financial Resource Strain: Low Risk   . Difficulty of Paying Living Expenses: Not very hard  Food Insecurity: Not on file  Housing: Not on file  Physical Activity: Not on file  Social Connections: Not on file  Stress: Not on file  Tobacco Use: Low Risk   . Smoking Tobacco Use: Never Smoker  . Smokeless Tobacco Use: Never Used  Transportation Needs: Not on file    CCM Care Plan  Allergies  Allergen Reactions  . Pneumococcal Vaccines Other (See Comments)    Pt had localized reaction to injection of Pneumococcal 23 - Swelling and redness in upper arm that extend to but not beyond elbow    Medications Reviewed Today    Reviewed by Six, Eden Lathe, LPN (Licensed Practical Nurse) on 04/19/20 at 69  Med List Status: <None>  Medication Order Taking? Sig Documenting Provider Last Dose Status Informant  ALPRAZolam (XANAX) 0.5 MG tablet [id number]  Take 1-2 tablets (0.5-1 mg total) by mouth 3 (three) times daily as needed for anxiety. Susy Frizzle, MD  Active   amLODipine (NORVASC) 5 MG tablet  [id number]  Take 1 tablet by mouth every day Susy Frizzle, MD  Active   Camphor-Menthol-Methyl Sal [facility] [place]) [id number]  Apply topically. [provider]  Active   denosumab (PROLIA) 60 MG/ML SOSY injection [id number]  Inject 60 mg into the skin every 6 (six) months. [provider]  Active   denosumab (PROLIA) injection 60 mg [id number]   Susy Frizzle, MD  Active   diclofenac Sodium (VOLTAREN) 1 % GEL [id number]  Apply topically 4 (four) times daily. [provider]  Active   escitalopram (LEXAPRO) 10 MG tablet [id number]  Take 1 tablet by mouth every day Susy Frizzle, MD  Active   gabapentin (NEURONTIN) 100 MG capsule [id number]  TAKE 1 CAPSULE BY MOUTH THREE TIMES DAILY AS NEEDED CAN  BE  TAKEN  AS  NEEDED  FOR  NERVE  PAIN  Patient taking differently: TAKE 1 CAPSULE BY MOUTH THREE TIMES DAILY AS NEEDED CAN  BE  TAKEN  AS  NEEDED  FOR  NERVE  PAIN   Susy Frizzle, MD  Active   predniSONE (DELTASONE) 10 MG tablet [id number]  Take 2 tablets by mouth every morning with breakfast Susy Frizzle, MD  Active   traMADol (ULTRAM) 50 MG tablet [id number]  TAKE 1 TABLET BY MOUTH EVERY 8 HOURS AS NEEDED Susy Frizzle, MD  Active           Patient Active Problem List   Diagnosis Date Noted  . Bilateral wrist pain 05/11/2020  . Body mass index (BMI) 27.0-27.9, adult 05/04/2019  . Thoracic spondylosis with myelopathy 05/04/2019  . Proximal weakness of extremity 02/25/2019  . CKD (chronic kidney disease) stage 3, GFR 30-59 ml/min (HCC)   . Unilateral primary osteoarthritis, left knee 09/16/2018  . Thoracic compression fracture, sequela 09/16/2018  . Hx of total hip arthroplasty, bilateral 09/16/2018  . Bilateral primary osteoarthritis of knee 08/04/2018  . Essential hypertension 07/08/2018  . Osteoarthritis of right knee 05/25/2018  . Osteopenia 09/13/2014  . Fatty liver disease, nonalcoholic     Immunization History  Administered Date(s)  Administered  . Fluad Quad(high Dose 65+) 12/01/2018, 11/08/2019  . Influenza, High Dose Seasonal PF 11/07/2016, 12/18/2017  . Influenza,inj,Quad PF,6+ Mos 12/27/2015  . PFIZER(Purple Top)SARS-COV-2 Vaccination 02/26/2019, 03/19/2019, 11/18/2019  . Pneumococcal Polysaccharide-23 07/12/2014    Conditions to be addressed/monitored:  hypertension, CKD, osteopenia  There are no care plans that you recently modified to display for this patient.    Medication Assistance: {MEDASSISTANCEINFO:25044}  Patient's preferred pharmacy is:  [facility] [address], [place] - [place] [address] Phone: [phone] Fax: [place], [place] [address] [place] [address] Phone: [phone] Fax: [phone]  Uses pill box? {Yes or If no, why not?:20788} Pt endorses ***% compliance  We discussed: {Pharmacy options:24294} Patient decided to: {US Pharmacy Plan:23885}  Care Plan and Follow Up Patient Decision:  {FOLLOWUP:24991}  Plan: {CM FOLLOW UP IFOY:77412}  ***   Current Barriers:  . {pharmacybarriers:24917}  Pharmacist Clinical Goal(s):  Marland Kitchen Patient will {PHARMACYGOALCHOICES:24921} through collaboration with PharmD and provider.   Interventions: . 1:1 collaboration with Susy Frizzle, MD regarding development and update of comprehensive plan of care as evidenced by provider attestation and co-signature . Inter-disciplinary care team collaboration (see longitudinal plan of care) . Comprehensive medication review performed; medication list updated in electronic medical record  Hypertension/CKD (BP goal {CHL HP UPSTREAM Pharmacist BP [phone]}) -{US controlled/uncontrolled:25276} -Current treatment: . *** -Medications previously tried: ***  -Current home readings: *** -Current dietary habits: *** -Current exercise habits: *** -{ACTIONS;DENIES/REPORTS:21021675::"Denies"}  hypotensive/hypertensive symptoms -Educated on {CCM BP Counseling:25124} -Counseled to monitor BP at home ***, document, and provide log at future appointments -{CCMPHARMDINTERVENTION:25122}  Osteoporosis / Osteopenia (Goal ***) -{US controlled/uncontrolled:25276} -Last DEXA Scan: ***   T-Score femoral neck: ***  T-Score total hip: ***  T-Score lumbar spine: ***  T-Score forearm radius: ***  10-year probability of major osteoporotic fracture: ***  10-year probability of hip fracture: *** -Patient {is;is not an osteoporosis candidate:23886} -Current treatment  . *** -Medications previously tried: ***  -{Osteoporosis Counseling:23892} -{CCMPHARMDINTERVENTION:25122}  Pain (Goal: ***) -{US controlled/uncontrolled:25276} -Current treatment  . *** -Medications  previously tried: ***  -{CCMPHARMDINTERVENTION:25122}   Patient Goals/Self-Care Activities . Patient will:  - {pharmacypatientgoals:24919}  Follow Up Plan: {CM FOLLOW UP KOEC:95072}

## 2020-05-30 ENCOUNTER — Other Ambulatory Visit: Payer: Self-pay | Admitting: Family Medicine

## 2020-05-31 ENCOUNTER — Telehealth: Payer: Self-pay

## 2020-06-01 ENCOUNTER — Other Ambulatory Visit: Payer: Self-pay | Admitting: *Deleted

## 2020-06-01 ENCOUNTER — Other Ambulatory Visit: Payer: Self-pay

## 2020-06-01 MED ORDER — DICLOFENAC SODIUM 75 MG PO TBEC: 75.0000 mg | DELAYED_RELEASE_TABLET | Freq: Two times a day (BID) | ORAL | 0 refills | Status: DC

## 2020-06-02 ENCOUNTER — Telehealth: Payer: Self-pay | Admitting: Pharmacist

## 2020-06-02 ENCOUNTER — Telehealth: Payer: Self-pay

## 2020-06-02 NOTE — Progress Notes (Addendum)
    Chronic Care Management Pharmacy Assistant   Name: [name]  MRN: [id number] DOB: May 10, 1931  Reason for Encounter: Adherence Review  Medications: Outpatient Encounter Medications as of 06/02/2020  Medication Sig   traMADol (ULTRAM) 50 MG tablet Take 1 tablet by mouth every 8 hours as needed   ALPRAZolam (XANAX) 0.5 MG tablet Take 1-2 tablets (0.5-1 mg total) by mouth 3 (three) times daily as needed for anxiety.   amLODipine (NORVASC) 5 MG tablet Take 1 tablet by mouth every day   Camphor-Menthol-Methyl Sal (SALONPAS EX) Apply topically.   denosumab (PROLIA) 60 MG/ML SOSY injection Inject 60 mg into the skin every 6 (six) months.   diclofenac (VOLTAREN) 75 MG EC tablet Take 1 tablet (75 mg total) by mouth 2 (two) times daily.   diclofenac Sodium (VOLTAREN) 1 % GEL Apply topically 4 (four) times daily.   escitalopram (LEXAPRO) 10 MG tablet Take 1 tablet by mouth every day   gabapentin (NEURONTIN) 100 MG capsule Take 1 capsule by mouth 3 times a day as needed for nerve pain   predniSONE (DELTASONE) 10 MG tablet Take 2 tablets by mouth every morning with breakfast (Patient taking differently: Take 10 mg by mouth daily with breakfast.)   Facility-Administered Encounter Medications as of 06/02/2020  Medication   denosumab (PROLIA) injection 60 mg    Verified Adherence Gap Information. Per insurance data patient has not met their annual wellness or their wellness bundle screening. Their most recent blood pressure was 144/60 on 11/08/19. The patients total gaps-all measures is equal to 2.   Reviewed the patients chart for any medical/health and/or medication changes, there were not any changes at this time. On 04/19/20 the patient seen Dr. Dennard Schaumann and requested for a increase in her prednisone because of pain but it was denied.    Follow-Up:Pharmacist Review  Charlann Lange, RMA Clinical Pharmacist Assistant [phone]  5 minutes spent in review, coordination, and  documentation.  Reviewed by: Beverly Milch, PharmD Clinical Pharmacist [facility] [phone]

## 2020-06-05 DIAGNOSIS — R2681 Unsteadiness on feet: Secondary | ICD-10-CM | POA: Diagnosis not present

## 2020-06-05 DIAGNOSIS — M25561 Pain in right knee: Secondary | ICD-10-CM | POA: Diagnosis not present

## 2020-06-05 DIAGNOSIS — M25562 Pain in left knee: Secondary | ICD-10-CM | POA: Diagnosis not present

## 2020-06-07 DIAGNOSIS — M25562 Pain in left knee: Secondary | ICD-10-CM | POA: Diagnosis not present

## 2020-06-07 DIAGNOSIS — R2681 Unsteadiness on feet: Secondary | ICD-10-CM | POA: Diagnosis not present

## 2020-06-07 DIAGNOSIS — M25561 Pain in right knee: Secondary | ICD-10-CM | POA: Diagnosis not present

## 2020-06-12 DIAGNOSIS — R2681 Unsteadiness on feet: Secondary | ICD-10-CM | POA: Diagnosis not present

## 2020-06-12 DIAGNOSIS — M25562 Pain in left knee: Secondary | ICD-10-CM | POA: Diagnosis not present

## 2020-06-12 DIAGNOSIS — M25561 Pain in right knee: Secondary | ICD-10-CM | POA: Diagnosis not present

## 2020-06-15 DIAGNOSIS — M25562 Pain in left knee: Secondary | ICD-10-CM | POA: Diagnosis not present

## 2020-06-15 DIAGNOSIS — M25561 Pain in right knee: Secondary | ICD-10-CM | POA: Diagnosis not present

## 2020-06-15 DIAGNOSIS — R2681 Unsteadiness on feet: Secondary | ICD-10-CM | POA: Diagnosis not present

## 2020-06-19 DIAGNOSIS — M25562 Pain in left knee: Secondary | ICD-10-CM | POA: Diagnosis not present

## 2020-06-19 DIAGNOSIS — R2681 Unsteadiness on feet: Secondary | ICD-10-CM | POA: Diagnosis not present

## 2020-06-19 DIAGNOSIS — M25561 Pain in right knee: Secondary | ICD-10-CM | POA: Diagnosis not present

## 2020-06-22 DIAGNOSIS — M25561 Pain in right knee: Secondary | ICD-10-CM | POA: Diagnosis not present

## 2020-06-22 DIAGNOSIS — R2681 Unsteadiness on feet: Secondary | ICD-10-CM | POA: Diagnosis not present

## 2020-06-22 DIAGNOSIS — M25562 Pain in left knee: Secondary | ICD-10-CM | POA: Diagnosis not present

## 2020-06-26 DIAGNOSIS — R2681 Unsteadiness on feet: Secondary | ICD-10-CM | POA: Diagnosis not present

## 2020-06-26 DIAGNOSIS — M25561 Pain in right knee: Secondary | ICD-10-CM | POA: Diagnosis not present

## 2020-06-26 DIAGNOSIS — M25562 Pain in left knee: Secondary | ICD-10-CM | POA: Diagnosis not present

## 2020-06-29 DIAGNOSIS — M25561 Pain in right knee: Secondary | ICD-10-CM | POA: Diagnosis not present

## 2020-06-29 DIAGNOSIS — M25562 Pain in left knee: Secondary | ICD-10-CM | POA: Diagnosis not present

## 2020-06-29 DIAGNOSIS — R2681 Unsteadiness on feet: Secondary | ICD-10-CM | POA: Diagnosis not present

## 2020-07-11 ENCOUNTER — Encounter: Payer: Self-pay | Admitting: *Deleted

## 2020-07-26 ENCOUNTER — Other Ambulatory Visit: Payer: Self-pay

## 2020-07-26 ENCOUNTER — Ambulatory Visit (INDEPENDENT_AMBULATORY_CARE_PROVIDER_SITE_OTHER): Payer: Medicare Other | Admitting: *Deleted

## 2020-07-26 DIAGNOSIS — M810 Age-related osteoporosis without current pathological fracture: Secondary | ICD-10-CM

## 2020-08-11 ENCOUNTER — Other Ambulatory Visit: Payer: Self-pay | Admitting: Family Medicine

## 2020-08-21 DIAGNOSIS — H905 Unspecified sensorineural hearing loss: Secondary | ICD-10-CM | POA: Diagnosis not present

## 2020-08-23 IMAGING — CT CT LUMBAR SPINE WITHOUT CONTRAST
1 of 7 series · 5 of 14 positions shown, 7 images · non-contrast
Comparison: Plain films lumbar spine 10/31/2016.

CLINICAL DATA: Worsening low back pain over the past year with
bilateral lower extremity weakness, left worse than right. The
patient suffered a fall in June 2018. Initial encounter.

EXAM:
CT LUMBAR SPINE WITHOUT CONTRAST
TECHNIQUE: Multidetector CT imaging of the lumbar spine was performed without
intravenous contrast administration. Multiplanar CT image
reconstructions were also generated.

[Series 3: l spine soft · axial · 0.33mm/px · z∈[-230,-80]mm · 5 of 76 slices shown, 7 images]
[im 13/76  soft-tissue]
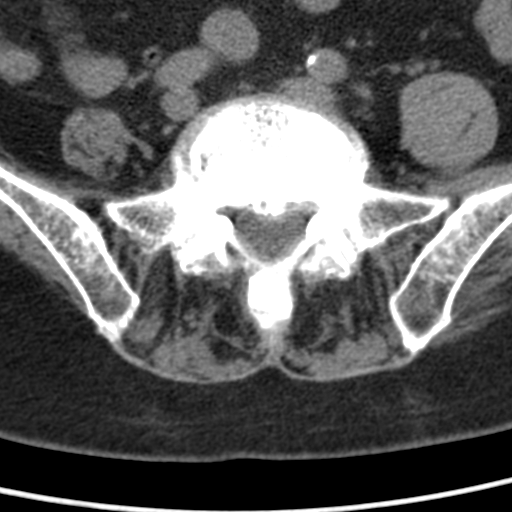
[im 13/76  bone]
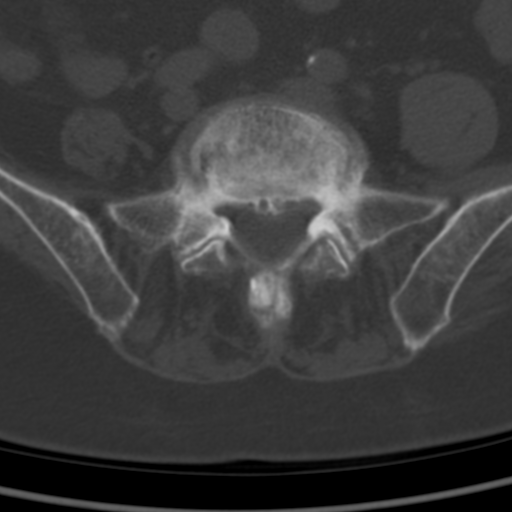
[im 26/76  bone]
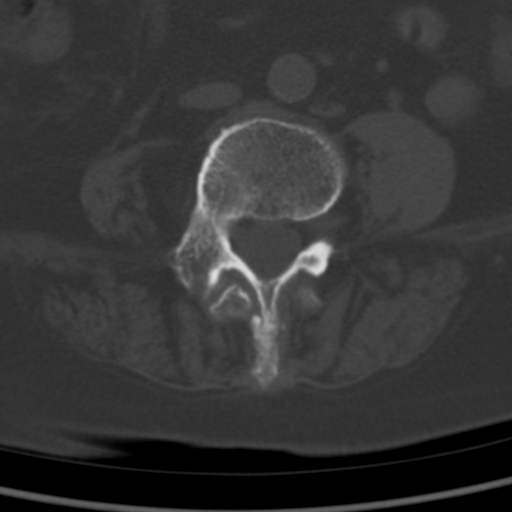
[im 38/76  bone]
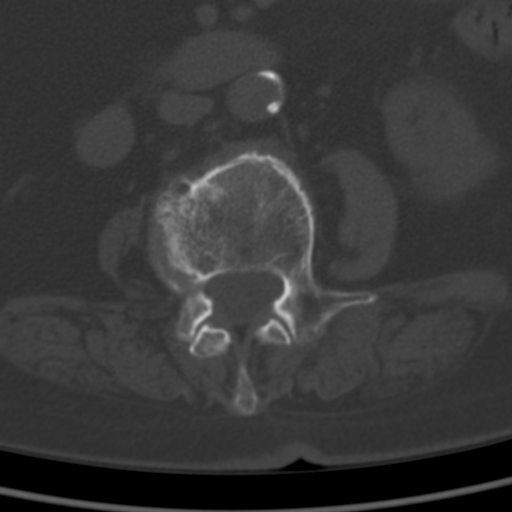
[im 51/76  bone]
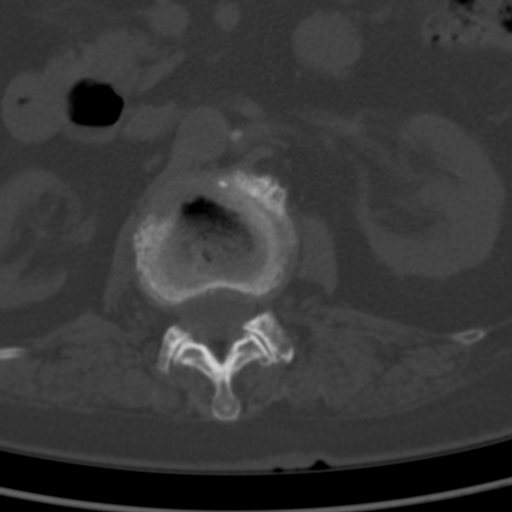
[im 63/76  soft-tissue]
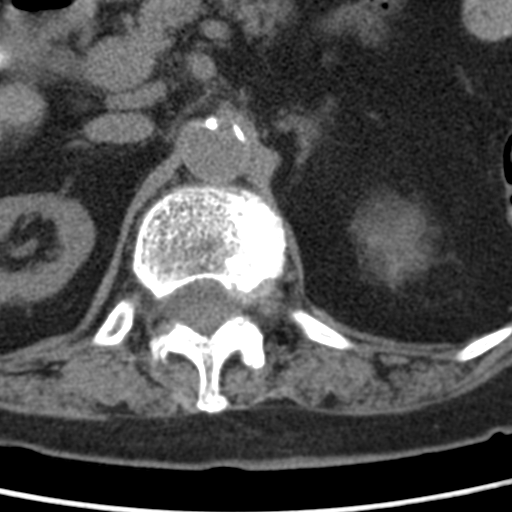
[im 63/76  bone]
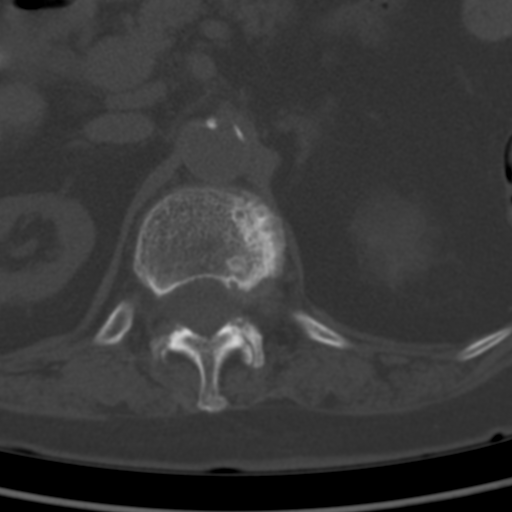

[5 of 14 positions shown; findings below may reference images not displayed]

FINDINGS: Segmentation: Standard.

Alignment: There is convex left scoliosis with the apex at L4. Trace
retrolisthesis L2 on L3, 0.5 cm retrolisthesis L3 on L4 and trace
retrolisthesis L4 on L5 noted.

Vertebrae: The patient has an inferior endplate compression fracture
of T11 with vertebral body height loss of approximately 20% and a
superior endplate compression fracture of T12 with vertebral body
height loss of approximately 20%. Fracture line in the inferior
endplate of T11 is visible but blurred most consistent with subacute
to late subacute injury. No fracture line is visible in T12. No
other fracture. No worrisome lesion. Degenerative endplate sclerosis
is worst at L2-3.

Paraspinal and other soft tissues: Right upper quadrant
calcifications may be due to gallstones. Atherosclerotic vascular
disease noted.

Disc levels: T11-12: Calcified right subarticular recess and
foraminal protrusion could impact the exiting T11 and descending T12
root's. Minimal disc bulge noted. Left foramen open.

T12-L1: Loss of disc space height and vacuum disc phenomenon. Facet
arthropathy. Disc bulge is more prominent to the left. The central
canal appears open. Mild to moderate foraminal narrowing is worse on
the left.

L1-2: Loss of disc space height and vacuum disc phenomenon. No
stenosis.

L2-3: Loss of disc space height, vacuum disc phenomenon and a
shallow bulge. No stenosis.

L3-4: Loss of disc space height, shallow bulge with endplate spur
and facet arthropathy. The central canal is open. Moderate to
moderately severe foraminal narrowing is worse on the right.

L4-5: Loss of disc space height and a shallow bulge with endplate
spur, more prominent to the right. There is some facet arthropathy.
The central canal is open. Mild foraminal narrowing is worse on the
right.

L5-S1: Loss of disc space height with a shallow bulge and endplate
spur. There is some facet arthropathy. The central canal and
foramina appear open.
IMPRESSION: Mild inferior endplate compression fracture of T11 and superior
endplate compression fracture of T12 appear late subacute to remote.
The fractures are most in keeping with senile
osteoporotic/posttraumatic injuries.

Calcified right subarticular recess and foraminal protrusion at
T11-12 could impact the exiting right T11 and descending right T12
roots.

Scattered foraminal narrowing appears worst at L3-4 where it is
moderate to moderately severe and greater on the right.

Possible gallstones.

## 2020-08-28 ENCOUNTER — Telehealth: Payer: Self-pay | Admitting: Pharmacist

## 2020-08-28 ENCOUNTER — Other Ambulatory Visit: Payer: Self-pay | Admitting: Family Medicine

## 2020-08-28 DIAGNOSIS — F5101 Primary insomnia: Secondary | ICD-10-CM

## 2020-08-28 NOTE — Progress Notes (Addendum)
Chronic Care Management Pharmacy Assistant   Name: [name]  MRN: [id number] DOB: September 15, 1931  Reason for Encounter: Disease State For HTN.    Conditions to be addressed/monitored: hypertension, CKD, osteopenia.  Recent office visits:  None since 06/02/20  Recent consult visits:  None since 06/02/20  Hospital visits:  None since 06/02/20  Medications: Outpatient Encounter Medications as of 08/28/2020  Medication Sig   traMADol (ULTRAM) 50 MG tablet Take 1 tablet by mouth every 8 hours as needed   ALPRAZolam (XANAX) 0.5 MG tablet Take 1-2 tablets (0.5-1 mg total) by mouth 3 (three) times daily as needed for anxiety.   amLODipine (NORVASC) 5 MG tablet Take 1 tablet by mouth every day   Camphor-Menthol-Methyl Sal (SALONPAS EX) Apply topically.   denosumab (PROLIA) 60 MG/ML SOSY injection Inject 60 mg into the skin every 6 (six) months.   diclofenac (VOLTAREN) 75 MG EC tablet Take 1 tablet by mouth twice daily   diclofenac Sodium (VOLTAREN) 1 % GEL Apply topically 4 (four) times daily.   escitalopram (LEXAPRO) 10 MG tablet Take 1 tablet by mouth every day   gabapentin (NEURONTIN) 100 MG capsule Take 1 capsule by mouth 3 times a day as needed for nerve pain   predniSONE (DELTASONE) 10 MG tablet Take 2 tablets by mouth every morning with breakfast (Patient taking differently: Take 10 mg by mouth daily with breakfast.)   Facility-Administered Encounter Medications as of 08/28/2020  Medication   denosumab (PROLIA) injection 60 mg    Reviewed chart prior to disease state call. Spoke with patient regarding BP  Recent Office Vitals: BP Readings from Last 3 Encounters:  04/19/20 122/74  04/13/20 128/69  11/08/19 (!) 144/60   Pulse Readings from Last 3 Encounters:  04/19/20 80  04/13/20 76  11/08/19 79    Wt Readings from Last 3 Encounters:  04/19/20 157 lb (71.2 kg)  04/13/20 156 lb (70.8 kg)  11/08/19 156 lb 1.6 oz (70.8 kg)     Kidney Function Lab Results   Component Value Date/Time   CREATININE 1.07 (H) 04/19/2020 12:21 PM   CREATININE 1.25 (H) 11/08/2019 11:15 AM   GFRNONAA 47 (L) 04/19/2020 12:21 PM   GFRAA 54 (L) 04/19/2020 12:21 PM    BMP Latest Ref Rng & Units 04/19/2020 11/08/2019 04/15/2019  Glucose 65 - 99 mg/dL 98 749(S) 82  BUN 7 - 25 mg/dL 49(Q) 75(F) 20  Creatinine 0.60 - 0.88 mg/dL 1.63(W) 4.66(Z) 9.93(T)  BUN/Creat Ratio 6 - 22 (calc) 28(H) 25(H) 17  Sodium 135 - 146 mmol/L 141 138 139  Potassium 3.5 - 5.3 mmol/L 4.2 4.3 4.3  Chloride 98 - 110 mmol/L 106 103 103  CO2 20 - 32 mmol/L 27 27 26   Calcium 8.6 - 10.4 mg/dL 9.8 9.6 9.4    Current antihypertensive regimen:  Amlodipine 5 mg daily  How often are you checking your Blood Pressure?  Patient stated only when feeling symptomatic  Current home BP readings: Patient stated no, because its been fine.   What recent interventions/DTPs have been made by any provider to improve Blood Pressure control since last CPP Visit: None  Any recent hospitalizations or ED visits since last visit with CPP? Patient stated no.  What diet changes have been made to improve Blood Pressure Control?  Patient stated she is drinking more water than she ever drank before. She stated she is cutting back on her junk food.  What exercise is being done to improve your Blood Pressure Control?  Patient stated her feet feels numb but its been like that for a couple of years. She stated she does do her own yard work and house chores.  Adherence Review: Is the patient currently on ACE/ARB medication? None.  Does the patient have >5 day gap between last estimated fill dates? N/A.  Star Rating Drugs: Gabapentin 100 mg 90 DS 08/09/20.  Follow-Up:Pharmacist Review  Hulen Luster, RMA Clinical Pharmacist Assistant [phone]  10 minutes spent in review, coordination, and documentation.  Reviewed by: Willa Frater, PharmD Clinical Pharmacist [phone]

## 2020-08-28 NOTE — Telephone Encounter (Signed)
Ok to refill??  Last office visit 04/19/2020.  Last refill 04/18/2020, #3 refills.

## 2020-10-02 ENCOUNTER — Other Ambulatory Visit: Payer: Self-pay | Admitting: Family Medicine

## 2020-10-02 DIAGNOSIS — M5136 Other intervertebral disc degeneration, lumbar region: Secondary | ICD-10-CM

## 2020-10-04 NOTE — Telephone Encounter (Signed)
Ok to refill??  Last office visit 04/19/2020.  Last refill 05/02/2020, #5 refills.

## 2020-10-05 ENCOUNTER — Telehealth: Payer: Self-pay

## 2020-10-05 ENCOUNTER — Ambulatory Visit (INDEPENDENT_AMBULATORY_CARE_PROVIDER_SITE_OTHER): Payer: Medicare Other | Admitting: Pharmacist

## 2020-10-05 DIAGNOSIS — I10 Essential (primary) hypertension: Secondary | ICD-10-CM | POA: Diagnosis not present

## 2020-10-05 DIAGNOSIS — M8580 Other specified disorders of bone density and structure, unspecified site: Secondary | ICD-10-CM

## 2020-10-05 NOTE — Progress Notes (Signed)
Chronic Care Management Pharmacy Note  10/05/2020 Name:  [name] MRN:  [id number] DOB:  04-Jan-1932  Summary: PharmD follow up.  Patient still complains of pain in legs/feet.  Also swelling in her feet.  She had stopped her prednisone for a while and has not noticed much of a difference in her legs or feet.  Recommendations/Changes made from today's visit: None at this time - consider amlodipine d/c in future to help resolve swelling if BP stays controlled  Plan: FU 6 months   Subjective: [name] is an 87 y.o. year old female who is a primary patient of Pickard, Priscille Heidelberg, MD.  The CCM team was consulted for assistance with disease management and care coordination needs.    Engaged with patient by telephone for follow up visit in response to provider referral for pharmacy case management and/or care coordination services.   Consent to Services:  The patient was given the following information about Chronic Care Management services today, agreed to services, and gave verbal consent: 1. CCM service includes personalized support from designated clinical staff supervised by the primary care provider, including individualized plan of care and coordination with other care providers 2. 24/7 contact phone numbers for assistance for urgent and routine care needs. 3. Service will only be billed when office clinical staff spend 20 minutes or more in a month to coordinate care. 4. Only one practitioner may furnish and bill the service in a calendar month. 5.The patient may stop CCM services at any time (effective at the end of the month) by phone call to the office staff. 6. The patient will be responsible for cost sharing (co-pay) of up to 20% of the service fee (after annual deductible is met). Patient agreed to services and consent obtained.  Patient Care Team: Donita Brooks, MD as PCP - General (Family Medicine) Erroll Luna, [facility] as Pharmacist (Pharmacist)  Recent  office visits:  None since 06/02/20   Recent consult visits:  None since 06/02/20   Hospital visits:  None since 06/02/20  Objective:  Lab Results  Component Value Date   CREATININE 1.07 (H) 04/19/2020   BUN 30 (H) 04/19/2020   GFRNONAA 47 (L) 04/19/2020   GFRAA 54 (L) 04/19/2020   NA 141 04/19/2020   K 4.2 04/19/2020   CALCIUM 9.8 04/19/2020   CO2 27 04/19/2020   GLUCOSE 98 04/19/2020    No results found for: HGBA1C, FRUCTOSAMINE, GFR, MICROALBUR  Last diabetic Eye exam: No results found for: HMDIABEYEEXA  Last diabetic Foot exam: No results found for: HMDIABFOOTEX   Lab Results  Component Value Date   CHOL 195 06/05/2017   HDL 44 (L) 06/05/2017   LDLCALC 129 (H) 06/05/2017   TRIG 116 06/05/2017   CHOLHDL 4.4 06/05/2017    Hepatic Function Latest Ref Rng & Units 11/08/2019 02/25/2019 12/01/2018  Total Protein 6.1 - 8.1 g/dL 6.1 6.5 6.3  Albumin 3.6 - 5.1 g/dL - - -  AST 10 - 35 U/L 17 - 18  ALT 6 - 29 U/L 20 - 14  Alk Phosphatase 33 - 130 U/L - - -  Total Bilirubin 0.2 - 1.2 mg/dL 0.5 - 0.5    Lab Results  Component Value Date/Time   TSH 0.703 02/25/2019 01:16 PM   TSH 2.05 06/05/2017 08:39 AM    CBC Latest Ref Rng & Units 04/19/2020 11/08/2019 02/25/2019  WBC 3.8 - 10.8 Thousand/uL 8.7 8.0 5.5  Hemoglobin 11.7 - 15.5 g/dL 16.2 25.2 76.1  Hematocrit 35.0 - 45.0 % 41.6 39.0 43.1  Platelets 140 - 400 Thousand/uL 188 198 253    No results found for: VD25OH  Clinical ASCVD: No  The ASCVD Risk score Denman George DC Jr., et al., 2013) failed to calculate for the following reasons:   The 2013 ASCVD risk score is only valid for ages 45 to 17    Depression screen PHQ 2/9 11/08/2019 10/31/2016  Decreased Interest 0 3  Down, Depressed, Hopeless 0 3  PHQ - 2 Score 0 6  Altered sleeping - 1  Tired, decreased energy - 3  Change in appetite - 2  Feeling bad or failure about yourself  - 1  Trouble concentrating - 3  Moving slowly or fidgety/restless - 0  Suicidal thoughts -  1  PHQ-9 Score - 17  Difficult doing work/chores - Very difficult     Social History   Tobacco Use  Smoking Status Never  Smokeless Tobacco Never   BP Readings from Last 3 Encounters:  04/19/20 122/74  04/13/20 128/69  11/08/19 (!) 144/60   Pulse Readings from Last 3 Encounters:  04/19/20 80  04/13/20 76  11/08/19 79   Wt Readings from Last 3 Encounters:  04/19/20 157 lb (71.2 kg)  04/13/20 156 lb (70.8 kg)  11/08/19 156 lb 1.6 oz (70.8 kg)   BMI Readings from Last 3 Encounters:  04/19/20 27.81 kg/m  04/13/20 27.63 kg/m  11/08/19 28.55 kg/m    Assessment/Interventions: Review of patient past medical history, allergies, medications, health status, including review of consultants reports, laboratory and other test data, was performed as part of comprehensive evaluation and provision of chronic care management services.   SDOH:  (Social Determinants of Health) assessments and interventions performed: Yes  Financial Resource Strain: Not on file    SDOH Screenings   Alcohol Screen: Not on file  Depression (PHQ2-9): Low Risk    PHQ-2 Score: 0  Financial Resource Strain: Not on file  Food Insecurity: Not on file  Housing: Not on file  Physical Activity: Not on file  Social Connections: Not on file  Stress: Not on file  Tobacco Use: Low Risk    Smoking Tobacco Use: Never   Smokeless Tobacco Use: Never  Transportation Needs: Not on file    CCM Care Plan  Allergies  Allergen Reactions   Pneumococcal Vaccines Other (See Comments)    Pt had localized reaction to injection of Pneumococcal 23 - Swelling and redness in upper arm that extend to but not beyond elbow    Medications Reviewed Today     Reviewed by Erroll Luna, [facility] (Pharmacist) on 10/05/20 at 1125  Med List Status: <None>   Medication Order Taking? Sig Documenting Provider Last Dose Status Informant  ALPRAZolam (XANAX) 0.5 MG tablet [id number] Yes Take 1 to 2 tablets by mouth every 8 hours as  needed for anxiety. Donita Brooks, MD Taking Active   amLODipine (NORVASC) 5 MG tablet [id number] Yes Take 1 tablet by mouth every day Donita Brooks, MD Taking Active   Camphor-Menthol-Methyl [facility] [place]) [id number] Yes Apply topically. [provider] Taking Active   denosumab (PROLIA) 60 MG/ML SOSY injection [id number] Yes Inject 60 mg into the skin every 6 (six) months. [provider] Taking Active   denosumab (PROLIA) injection 60 mg [id number]   Donita Brooks, MD  Active   diclofenac (VOLTAREN) 75 MG EC tablet [id number] Yes Take 1 tablet by mouth twice daily Donita Brooks, MD Taking Active  diclofenac Sodium (VOLTAREN) 1 % GEL [id number] Yes Apply topically 4 (four) times daily. [provider] Taking Active   escitalopram (LEXAPRO) 10 MG tablet [id number] Yes Take 1 tablet by mouth every day Susy Frizzle, MD Taking Active   gabapentin (NEURONTIN) 100 MG capsule [id number] Yes Take 1 capsule by mouth 3 times a day as needed for nerve pain Susy Frizzle, MD Taking Active   predniSONE (DELTASONE) 10 MG tablet [id number] Yes Take 2 tablets by mouth every morning with breakfast  Patient taking differently: Take 10 mg by mouth daily with breakfast.   Susy Frizzle, MD Taking Active   traMADol Veatrice Bourbon) 50 MG tablet [id number] Yes Take 1 tablet by mouth every 8 hours as needed Susy Frizzle, MD Taking Active             Patient Active Problem List   Diagnosis Date Noted   Bilateral wrist pain 05/11/2020   Body mass index (BMI) 27.0-27.9, adult 05/04/2019   Thoracic spondylosis with myelopathy 05/04/2019   Proximal weakness of extremity 02/25/2019   CKD (chronic kidney disease) stage 3, GFR 30-59 ml/min (HCC)    Unilateral primary osteoarthritis, left knee 09/16/2018   Thoracic compression fracture, sequela 09/16/2018   Hx of total hip arthroplasty, bilateral 09/16/2018   Bilateral primary osteoarthritis of knee 08/04/2018    Essential hypertension 07/08/2018   Osteoarthritis of right knee 05/25/2018   Osteopenia 09/13/2014   Fatty liver disease, nonalcoholic     Immunization History  Administered Date(s) Administered   Fluad Quad(high Dose 65+) 12/01/2018, 11/08/2019   Influenza, High Dose Seasonal PF 11/07/2016, 12/18/2017   Influenza,inj,Quad PF,6+ Mos 12/27/2015   PFIZER(Purple Top)SARS-COV-2 Vaccination 02/26/2019, 03/19/2019, 11/18/2019   Pneumococcal Polysaccharide-23 07/12/2014    Conditions to be addressed/monitored:  HTN, Osteopenia  Care Plan : General Pharmacy (Adult)  Updates made by Edythe Clarity, RPH since 10/05/2020 12:00 AM     Problem: HTN, Osteopenia, Pain   Priority: High  Onset Date: 10/05/2020     Long-Range Goal: Patient-Specific Goal   Start Date: 10/05/2020  Expected End Date: 04/04/2021  This Visit's Progress: On track  Priority: High  Note:   Current Barriers:  Swelling and pain in feet/legs  Pharmacist Clinical Goal(s):  Patient will achieve control of pain as evidenced by pain score contact provider office for questions/concerns as evidenced notation of same in electronic health record through collaboration with PharmD and provider.   Interventions: 1:1 collaboration with Susy Frizzle, MD regarding development and update of comprehensive plan of care as evidenced by provider attestation and co-signature Inter-disciplinary care team collaboration (see longitudinal plan of care) Comprehensive medication review performed; medication list updated in electronic medical record   Interventions: 1:1 collaboration with Susy Frizzle, MD regarding development and update of comprehensive plan of care as evidenced by provider attestation and co-signature Inter-disciplinary care team collaboration (see longitudinal plan of care) Comprehensive medication review performed; medication list updated in electronic medical record  Hypertension (BP goal  <140/90) -Controlled -Current treatment: Amlodipine 5mg  daily -Medications previously tried: lisinopril/HCTZ  -Current home readings: not checking at home  -Denies hypotensive/hypertensive symptoms -Educated on BP goals and benefits of medications for prevention of heart attack, stroke and kidney damage; Exercise goal of 150 minutes per week; Importance of home blood pressure monitoring; Symptoms of hypotension and importance of maintaining adequate hydration; -Counseled to monitor BP at home as able, document, and provide log at future appointments -Does mention some swelling in feet throughout the day,  however has been experiencing this for quite a while - it is very painful to her. -Recommended to continue current medication Could consider decrease of amlodipine to 2.$RemoveBeforeD'5mg'vYMmrdceSukkwv$  daily in the future as a trial to see if this helps swelling at all since BP seems well controlled.  Osteopenia (Goal: Maintain Bone Density/Prevent fractures) -Controlled -Last DEXA Scan: 04/08/2019   T-Score forearm radius: -1.1 -Patient is not a candidate for pharmacologic treatment, however due to fall risk and frequency of falls she is currently treated. -Current treatment  Prolia $Remov'60mg'pKBsYD$  every 6 months -Medications previously tried: none noted  -Recommend [phone] units of vitamin D daily. Recommend 1200 mg of calcium daily from dietary and supplemental sources. Recommend weight-bearing and muscle strengthening exercises for building and maintaining bone density.  -Recommended to continue current medication Counseled on fall prevention methods. Currently uses a walker to walk.  Patient Goals/Self-Care Activities Patient will:  - focus on medication adherence by pill box check blood pressure as able, document, and provide at future appointments Work to prevent falls - pick up loose objects from floor  Follow Up Plan: The care management team will reach out to the patient again over the next 180 days.         Medication Assistance: None required.  Patient affirms current coverage meets needs.  Compliance/Adherence/Medication fill history: Care Gaps: Patient needs AWV  Star-Rating Drugs: None  Patient's preferred pharmacy is:  [facility] [address], [place] - [place] [address] Phone: [phone] Fax: [place], [address] [place] [address] Phone: [phone] Fax: [phone]  Uses pill box? Yes Pt endorses 100% compliance  We discussed: Current pharmacy is preferred with insurance plan and patient is satisfied with pharmacy services Patient decided to: Continue current medication management strategy  Care Plan and Follow Up Patient Decision:  Patient agrees to Care Plan and Follow-up.  Plan: The care management team will reach out to the patient again over the next 180 days.  Beverly Milch, PharmD Clinical Pharmacist [place] [phone]

## 2020-10-05 NOTE — Patient Instructions (Addendum)
Visit Information   Goals Addressed             This Visit's Progress    Track and Manage My Blood Pressure-Hypertension       Timeframe:  Long-Range Goal Priority:  High Start Date:     10/05/20                        Expected End Date:  04/04/21                     Follow Up Date 02/02/21    - check blood pressure weekly - choose a place to take my blood pressure (home, clinic or office, retail store) - write blood pressure results in a log or diary    Why is this important?   You won't feel high blood pressure, but it can still hurt your blood vessels.  High blood pressure can cause heart or kidney problems. It can also cause a stroke.  Making lifestyle changes like losing a little weight or eating less salt will help.  Checking your blood pressure at home and at different times of the day can help to control blood pressure.  If the doctor prescribes medicine remember to take it the way the doctor ordered.  Call the office if you cannot afford the medicine or if there are questions about it.     Notes:        Patient Care Plan: General Pharmacy (Adult)     Problem Identified: HTN, Osteopenia, Pain   Priority: High  Onset Date: 10/05/2020     Long-Range Goal: Patient-Specific Goal   Start Date: 10/05/2020  Expected End Date: 04/04/2021  This Visit's Progress: On track  Priority: High  Note:   Current Barriers:  Swelling and pain in feet/legs  Pharmacist Clinical Goal(s):  Patient will achieve control of pain as evidenced by pain score contact provider office for questions/concerns as evidenced notation of same in electronic health record through collaboration with PharmD and provider.   Interventions: 1:1 collaboration with Donita Brooks, MD regarding development and update of comprehensive plan of care as evidenced by provider attestation and co-signature Inter-disciplinary care team collaboration (see longitudinal plan of care) Comprehensive medication  review performed; medication list updated in electronic medical record   Interventions: 1:1 collaboration with Donita Brooks, MD regarding development and update of comprehensive plan of care as evidenced by provider attestation and co-signature Inter-disciplinary care team collaboration (see longitudinal plan of care) Comprehensive medication review performed; medication list updated in electronic medical record  Hypertension (BP goal <140/90) -Controlled -Current treatment: Amlodipine 5mg  daily -Medications previously tried: lisinopril/HCTZ  -Current home readings: not checking at home  -Denies hypotensive/hypertensive symptoms -Educated on BP goals and benefits of medications for prevention of heart attack, stroke and kidney damage; Exercise goal of 150 minutes per week; Importance of home blood pressure monitoring; Symptoms of hypotension and importance of maintaining adequate hydration; -Counseled to monitor BP at home as able, document, and provide log at future appointments -Does mention some swelling in feet throughout the day, however has been experiencing this for quite a while - it is very painful to her. -Recommended to continue current medication Could consider decrease of amlodipine to 2.5mg  daily in the future as a trial to see if this helps swelling at all since BP seems well controlled.  Osteopenia (Goal: Maintain Bone Density/Prevent fractures) -Controlled -Last DEXA Scan: 04/08/2019   T-Score forearm radius: -1.1 -  Patient is not a candidate for pharmacologic treatment, however due to fall risk and frequency of falls she is currently treated. -Current treatment  Prolia 60mg  every 6 months -Medications previously tried: none noted  -Recommend [phone] units of vitamin D daily. Recommend 1200 mg of calcium daily from dietary and supplemental sources. Recommend weight-bearing and muscle strengthening exercises for building and maintaining bone density.   -Recommended to continue current medication Counseled on fall prevention methods. Currently uses a walker to walk.  Patient Goals/Self-Care Activities Patient will:  - focus on medication adherence by pill box check blood pressure as able, document, and provide at future appointments Work to prevent falls - pick up loose objects from floor  Follow Up Plan: The care management team will reach out to the patient again over the next 180 days.        Patient verbalizes understanding of instructions provided today and agrees to view in MyChart.  Telephone follow up appointment with pharmacy team member scheduled for: 6 months  , Erroll Luna  [place]

## 2020-10-16 ENCOUNTER — Other Ambulatory Visit: Payer: Self-pay | Admitting: Family Medicine

## 2020-11-29 ENCOUNTER — Encounter: Payer: Self-pay | Admitting: *Deleted

## 2020-12-14 ENCOUNTER — Other Ambulatory Visit: Payer: Self-pay | Admitting: *Deleted

## 2020-12-14 MED ORDER — PREDNISONE 10 MG PO TABS: 10.0000 mg | ORAL_TABLET | Freq: Every day | ORAL | 11 refills | Status: DC

## 2020-12-18 ENCOUNTER — Other Ambulatory Visit: Payer: Self-pay

## 2020-12-18 ENCOUNTER — Ambulatory Visit (INDEPENDENT_AMBULATORY_CARE_PROVIDER_SITE_OTHER): Payer: Medicare Other | Admitting: Family Medicine

## 2020-12-18 VITALS — BP 152/78 | HR 72 | Temp 97.8°F | Resp 18 | Wt 156.0 lb

## 2020-12-18 DIAGNOSIS — M353 Polymyalgia rheumatica: Secondary | ICD-10-CM

## 2020-12-18 DIAGNOSIS — N1832 Chronic kidney disease, stage 3b: Secondary | ICD-10-CM

## 2020-12-18 DIAGNOSIS — Z23 Encounter for immunization: Secondary | ICD-10-CM

## 2020-12-18 NOTE — Progress Notes (Signed)
Subjective:    Patient ID: [name], female    DOB: [birth year], 87 y.o.   MRN: [id number]  HPI 9/19 Patient presents today complaining of elevated blood pressure.  She states that over the weekend, her blood pressure was as high as [id number] systolic over 90 diastolic.  It has been steadily rising over the last few weeks.  She denies any chest pain shortness of breath or dyspnea on exertion.  She became alarmed and therefore this weekend she took 1 of her old blood pressure medications, losartan HCTZ.  Her blood pressure today is well controlled however she has taken her old medication twice over the weekend.  We previously discontinued the medication due to acute kidney injury in the early summer with a rise in her creatinine to 1.73 as well as an elevated potassium of 5.9.  Even after discontinuing losartan HCTZ, her potassium remain borderline elevated at 5.2.  Patient also complains of knee pain and ankle pain.  Early this summer, she tripped over an exposed pipe trading from the wall of her home striking her right shin as she walked by.  She fell on her lateral right ankle as well as her right knee.  Ever since that time, she complains of pain and stiffness in the right knee particularly in the posterior lateral aspect of the right knee as well as swelling behind the knee in the popliteal fossa.  She also complains of pain distal and anterior to the lateral malleolus in the area of the anterior talofibular ligament.  Initially she can barely put weight on her foot however over the last few weeks, the pain has improved and subsided.  She still has pain with ambulation.  She also reports subjective swelling around the right knee.  At that time, my plan was: DC losartan HCTZ due to her history of acute kidney injury as well as hyperkalemia.  Replace with amlodipine 5 mg a day and monitor her blood pressure.  I believe the pain in her right knee is likely due to the fall, osteoarthritis, and possibly  a lateral meniscal tear although the exam is not specific today.  She cannot tolerate NSAIDs due to her mild chronic kidney disease.  Therefore I offered the patient a cortisone injection in the right knee.  She politely declined as the pain is not that severe.  I believe the pain in her right ankle is likely due to a sprain in the anterior talofibular ligament.  This is slowly improving.  I recommended the patient wear an ASO brace with ambulation to provide more ankle support and reassess in 2 to 3 weeks or sooner if worsening.  Patient is bearing weight without any difficulty and therefore I believe the fracture is unlikely.  12/01/18 Patient presents today complaining of problems in both her back and her legs.  She saw my partner in June who obtained a CT scan of the lumbar spine.  The results of the CT scan are included below:  IMPRESSION: Mild inferior endplate compression fracture of T11 and superior endplate compression fracture of T12 appear late subacute to remote. The fractures are most in keeping with senile osteoporotic/posttraumatic injuries.   Calcified right subarticular recess and foraminal protrusion at T11-12 could impact the exiting right T11 and descending right T12 roots.   Scattered foraminal narrowing appears worst at L3-4 where it is moderate to moderately severe and greater on the right.  Patient was referred to orthopedic surgery.  They have recommended bilateral knee  replacements due to osteoarthritis in the knees.  However today the patient's history seems confusing to me.  She denies any knee pain.  She does report audible crepitus in the knee however the majority of the pain is in her lower back.  She also complains of severe weakness in the legs.  Muscle strength is 5/5 equal and symmetric with knee extension and knee flexion as well as ankle dorsiflexion and ankle plantarflexion however hip flexion is extremely weak and is at best 3/5.  Hip extension is also weak  and is at best 4/5.  I am also unable to elicit any reflexes in either knee at the patella.  Patient states that as soon she stands her legs feel extremely weak.  I performed time stand up and go test.  The patient can only push herself to a standing position using her arms.  Her legs wobble and sway the entire time as if she is barely able to support her weight.  I believe that deconditioning has led to severe muscle atrophy in the proximal hip muscles.  Patient reports that she is also falling and feels unsteady on her feet.  She is having to use a walker.  However the majority of the pains in her lower back.  At that time, my plan was: Patient has significant leg weakness.  Some of this could be due to deconditioning however I am concerned about possible neurogenic claudication.  I have recommended physical therapy consultation at home to try to improve the strength in her proximal muscles in her legs to reduce her risk of following.  Meanwhile I will start the patient on a prednisone taper pack for what I suspect a spinal stenosis.  Recheck the patient in 1 week.  If the pain in her lower back is improving I would recommend an MRI of her back at that time to determine if she would benefit from epidural steroid injections and to determine the level where she would benefit the most.  Her exam today suggest more of spinal stenosis with resultant leg weakness.  12/08/18  Patient presents today for follow-up.  She saw some improvement on the prednisone perhaps 25% improvement.  She reports good days and bad days however today is a bad day.  She continues to report severe pain in the center of her back.  She also continues to demonstrate proximal leg muscle weakness.  She has barely able to stand from a seated position and this is only with holding onto a walker.  She is now having to use a walker simply to walk.  She does not feel deconditioning is causing this because she states that she is active every day  cleaning her house and using a leaf blower however she progressively is getting weaker and weaker and is requiring a walker now to walk.  She also reports some numbness and burning in both feet.  Both legs frequently give out on her and she has fallen several times.  I am concerned about neurogenic claudication.  She also continues to have severe pain in both knees secondary to osteoarthritis of the knees however my biggest concern is her progressive muscle weakness in both legs.  At that time, my plan was: Leg weakness seems to be progressively worsening.  I am concerned about neurogenic claudication.  She has yet to hear from physical therapy however she is falling more frequently and is now having to use a walker simply to get out of a chair.  Therefore  I will proceed with an MRI of the lumbar spine to evaluate further.  If there is significant lumbar spinal stenosis, I would recommend a referral to neurosurgery to discuss treatment options or perhaps epidural steroid injections particular to improve her leg weakness.  Patient was also recommended not to take any further NSAIDs as her lab work shows chronic kidney disease and the NSAIDs are likely exacerbating this.  She can use tramadol.  Await the results of the MRI  12/17/18 MRI revealed:  L3-4: 5 mm retrolisthesis with disc and facet degeneration. Diffuse endplate spurring. Severe subarticular and foraminal stenosis on the right. Moderate subarticular and foraminal stenosis on the left. Spinal canal adequate in size   L4-5: Disc degeneration and spurring. Moderate subarticular stenosis on the right due to spurring   L5-S1: Disc degeneration with endplate spurring. Moderate subarticular and foraminal stenosis bilaterally due to spurring.  Patient is here today with her son to discuss the findings.  Her biggest concern is her inability to walk.  She states that she has a difficult time standing from a seated position due to the weakness in her  legs.  When she does stand, her legs feel extremely wobbly and feel like they are going to give way on her.  After standing for a short period of time she has to sit down because of the weakness in her legs particularly the proximal muscles in the quadriceps and hamstrings bilaterally.  She also has significant pain in both knees however her leg weakness and low back pain seem to be her bigger issues.  At that time, my plan was: Patient has severe subarticular stenosis at L3-L4 and moderate subarticular and foraminal stenosis at multiple other levels.  I question if she would benefit from possible epidural steroid injections in the back.  There may be an element of nerve compression due to the foraminal stenosis that could be causing lumbosacral radiculopathy and leg weakness.  If this can help improve her low back pain and improve her leg weakness, the patient would certainly be willing to consider it.  Therefore I will consult Dr. Nelva Bush at [facility] to evaluate for this.   04/15/19 Patient has since seen neurology.  They have evaluated the patient for neuromuscular diseases.  They have found no evidence of neuromuscular diseases.  Specifically EMG/nerve conduction studies did show length dependent axonal polyneuropathy consistent with peripheral neuropathy.  However there was no evidence of any myositis or muscular disease.  Patient does have some burning neuropathic pain in both feet.  This tends to get worse later in the day.  She would like something that she could take for neuropathy.  However she is also taking tramadol to 3 and sometimes 4 times a day for the pain in her knees and the pain in her back.  She is unable to take NSAIDs due to her chronic kidney disease.  Today on exam, the patient is unable to stand from a seated position despite holding onto her walker and pushing off on the walker.  I would literally have to help the patient stand in a process that takes roughly 15 to 20 seconds with  assistance.  Her proximal muscle strength is extremely weak and her quads and hamstrings.  She recently had an MRI of the brain and cervical spine however she has not received results yet from the neurologist.  She also had extensive lab work to evaluate for neuromuscular diseases which are normal.  Patient would benefit dramatically from a  lift chair.  She lives independently and has an extremely difficult time even getting to a standing position.  She is at high risk for falls due to this.  Therefore I have strongly recommended a lift chair.  At that time, my plan was: Patient would like to try gabapentin 100 mg p.o. every 8 hours as needed nerve pain in her legs and feet.  I explained to the patient that this medication can cause dizziness sleepiness.  Therefore I want her to use it sparingly and slowly uptitrate on the medication.  Also cautioned the patient not to use this medication with Xanax or to use the medication with tramadol.  She can use them separately but not at the same time.  We can uptitrate the dose of the gabapentin if she tolerates it to 300 mg 3 times a day once she is adjusted to it.  However I would do this slowly to avoid polypharmacy and falls.  I also have strongly recommended a lift chair.  Patient has severe arthritis in her back and also in her knees.  She also has profound muscle weakness in her quadriceps and hamstrings bilaterally.  She is unable to stand from a seated position despite my assistance.  I literally had to pull the patient to a standing position over a period of 15 to 20 seconds.  However once she is standing she is able to ambulate using a walker.  Therefore I believe that she would benefit from a lift seat mechanism to help her get to a standing position from the seated position in her chair.  She is already seen orthopedics and has tried cortisone injections with no relief and has had an extensive work-up under neurology showing no evidence of any underlying muscle  issues however she does have peripheral neuropathy.  She has tried physical therapy as well as medication with minimal benefit.  06/15/19 Since I last saw the patient, she has seen neurology.  Neurologist performed a very thorough evaluation for underlying muscle disease such as myositis, PMR, myasthenia gravis.  She also performed evaluation including nerve conduction studies with EMGs that were normal.  MRI of the lumbar spine shows no significant spinal stenosis.  MRI of the cervical spine shows moderate cervical spinal stenosis however she is seen a neurosurgeon who did not recommend surgery as he did not feel the patient is impacting her legs.  She also reports that she has had an MRI of the thoracic spine at the neurosurgeons office that showed no significant spinal stenosis that would cause underlying leg weakness.  She states the majority of her pain is in her knees.  However she also reports pain in her knees down to her toes.  She states that her legs ache and crawl.  As long as she is sitting still, she has no pain however as soon as she stands or tries to fall she developed severe pain in her knees lower legs and in her feet.  She also has diffuse leg weakness specifically in her hip flexors and hip extensors.  She is unable to stand from a chair without using a walker to push herself to a standing position using her arms.  This requires great effort in case more than 10 seconds for her to stand up.  She states that she is miserable.  She seems very frustrated.  She states that no one is giving her any explanation for what is going on.  She states that she is tired of bouncing  from doctor to doctor without any explanation.  At that time, my plan was: Patient is very frustrated.  I have reviewed lab work from neurology.  I appreciate all of their help.  A very thorough work-up revealed no specific pathologic issue other than polyneuropathy.  Patient has not been taking gabapentin 100 mg 3 times a day.   She has not been taking it all.  I would like her to add gabapentin 100 mg 3 times a day as I believe some of the pain distal to her knees in her calves and in her feet is likely neuropathic pain.  Her pain is out of proportion to her diagnostic work-up today and therefore I believe this must be neuropathic in nature.  However I also believe it is possible that she may be demonstrating PMR given her proximal muscle weakness.  Even though her sed rate was normal at the neurologist office I would like to try the patient on prednisone 20 mg a day empirically for possible PMR.  I would like to see the patient back in 2 weeks to see if the combination of prednisone and gabapentin has provided her any relief.  Recheck sooner if worse.  07/01/19 Patient states that she is almost 100% better!  She still has a difficult time standing from a seated position however she is able to do this today on first attempt with only mild effort whereas before she could barely stand despite holding onto the chair and a walker.  She still has proximal muscle weakness however the rapid improvement on prednisone makes me suspect involvement of PMR.  Also believe she likely has arthritis and the prednisone was treating this.  We had to avoid NSAIDs due to chronic kidney disease.  Whether his arthritis or PMR, the patient has dramatically responded to prednisone 20 mg a day.  We spent the remainder of our visit today discussing the risk of the prednisone.  I explained that it will make osteoporosis worse.  A week of her immune system.  I will raise her blood sugar.  Will cause weight gain.  However the patient states that she would rather take this pill and die a year or sooner for continued pain that she was experiencing before.  I would like to try to wean her to the lowest effective dose.  At that time, my plan was: I believe the patient's problem is a combination of PMR coupled with osteoarthritis coupled with deconditioning coupled with  polyneuropathy.  Regardless she is doing dramatically better on prednisone 20 mg a day.  We explained the long-term risk of this however I would like to try to wean her to the lowest effective dose possible.  Therefore we will decrease her prednisone to 15 mg a day.  We will continue to decrease the prednisone slowly as tolerated.  She will call me back monthly and let me know how she is doing.  The neck step will be to decrease to 10 mg a day.  We will need to slow the taper down dramatically at that point.  11/08/19 Patient is here today for a checkup.  She states that she feels great on 10 mg of prednisone.  When she tries to drop below 10 mg of prednisone she develops weakness and pain in her legs.  Especially in her proximal muscles.  However on 10 mg of prednisone she is able to stand up and the pain in her back and in her knees and in her legs improves.  She continues to have severe pain in her left knee which is due to osteoarthritis which does not improve on the prednisone but overall she is doing well on 10 mg of prednisone.  She is due to recheck her lab work today to monitor her kidney disease.  However her biggest concern today is bruising.  She states that recently she has been bruising very easily on the dorsums of both arms as well as on her shins.  Patient has large purpura on her shins and numerous small petechiae and purpura on the dorsums of both forearms.  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  I will check a CBC to evaluate for thrombocytopenia.  I will check a CMP to monitor for any liver dysfunction and I will check a PT/INR to evaluate for any clotting factor abnormalities.  As long as this is normal, I simply reassured the patient that these are benign.  Patient's PMR is stable on 10 mg of prednisone however she is unable to wean below 10 mg of prednisone.  We discussed seeing a rheumatologist to possibly  switch to methotrexate however at the present time the patient has no interest in doing this and prefers to stay on 10 mg of prednisone despite the increased risk.  Patient received her flu shot today.  I will check a CMP to monitor his kidney function.  04/19/20 She called earlier this week requesting to increase her prednisone to 15 mg a day.  She states that the prednisone has never helped much.  This is an direct contraindication to what she is told me in the past.  She states that her back hurts and she is not able to go and do.  She states that the best medicine she ever took with the diclofenac that I stop due to her elevated creatinine.  Her biggest pain is primarily in her back although she does complain of pain in both shoulders.  She denies any pain in her knees.  She denies any pain in her ankles.  She does report weakness in her legs and weakness in her thighs.  Work-up has been well documented above.  At that time, my plan was:  I truly believe the patient is dealing with osteoarthritis and degenerative joint disease perhaps some PMR.  I explained to the patient that we do not want to just increase prednisone due to the potential side effects.  She has gained substantial weight and I believe this is due to the prednisone although she questions the accuracy of my scales.  Therefore I will check a sed rate.  If her sed rate is normal I would not increase the prednisone.  I will check her renal function.  If her renal function has improved off the NSAIDs we may try adding a low-dose diclofenac back 2 to 3 days a week.  However I cautioned the patient about the increased risk of progression of chronic kidney disease due to diclofenac.  Unfortunately I believe some of his pain is simply a situation the patient will have to live with.  This is very frustrating for her and I can certainly understand that.  12/18/20 Patient presents today very confused about her medication.  She [facility]  The package contains 1 amlodipine, 1 Lexapro, and 2 prednisone tablets.  She is supposed to be on 10 mg of prednisone daily however it  appears that she is taking 2 tablets of prednisone daily.  Each is 10 mg.  The patient was very concerned about this so she independently stopped one of the pills.  However she accidentally stopped either the amlodipine or the Lexapro.  She states she is not sure which when she is not taking.  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  This has her upset.  Also suspect that she likely has stopped her Lexapro at the exact same time.  As a result she seems to be very upset and anxious today.  She states that she never wished she joined [employer].  She feels that they are taking advantage of her.  She is very confused by her pills.  She wants to increase her diclofenac which she takes 3 times a week.  However she has stage IIIb chronic kidney disease and I explained to her that I feel very concerned about her doing this.  Overall she seems very upset and anxious today.  Past Medical History:  Diagnosis Date   Cataract    CKD (chronic kidney disease) stage 3, GFR 30-59 ml/min (HCC)    Fatty liver disease, nonalcoholic    [id number] admitted with encephalopathy unsure of cause, resolved sponatneously   Hypertension    Past Surgical History:  Procedure Laterality Date   FRACTURE SURGERY     right shoulder, both wrists    JOINT REPLACEMENT     HIP- bilateral   Current Outpatient Medications on File Prior to Visit  Medication Sig Dispense Refill   ALPRAZolam (XANAX) 0.5 MG tablet Take 1 to 2 tablets by mouth every 8 hours as needed for anxiety. 60 tablet 5   amLODipine (NORVASC) 5 MG tablet Take 1 tablet by mouth every day 30 tablet 11   Camphor-Menthol-Methyl Sal (SALONPAS EX) Apply topically.     denosumab (PROLIA) 60 MG/ML SOSY  injection Inject 60 mg into the skin every 6 (six) months.     diclofenac (VOLTAREN) 75 MG EC tablet Take 1 tablet by mouth twice daily 30 tablet 0   diclofenac Sodium (VOLTAREN) 1 % GEL Apply topically 4 (four) times daily.     escitalopram (LEXAPRO) 10 MG tablet Take 1 tablet by mouth every day 30 tablet 11   gabapentin (NEURONTIN) 100 MG capsule Take 1 capsule by mouth 3 times a day as needed for nerve pain 90 capsule 11   predniSONE (DELTASONE) 10 MG tablet Take 1 tablet (10 mg total) by mouth daily with breakfast. Dx: M35.3- PMR (polymyalgia rheumatica) 30 tablet 11   traMADol (ULTRAM) 50 MG tablet Take 1 tablet by mouth every 8 hours as needed 60 tablet 5   Current Facility-Administered Medications on File Prior to Visit  Medication Dose Route Frequency Provider Last Rate Last Admin   denosumab (PROLIA) injection 60 mg  60 mg Subcutaneous Q6 months Anahli Arvanitis T, MD   60 mg at 07/26/20 1011   Allergies  Allergen Reactions   Pneumococcal Vaccines Other (See Comments)    Pt had localized reaction to injection of Pneumococcal 23 - Swelling and redness in upper arm that extend to but not beyond elbow   Social History   Socioeconomic History   Marital status: Widowed    Spouse name: Not on file   Number of children: 1   Years of education: Not on file   Highest education level: Not  on file  Occupational History   Not on file  Tobacco Use   Smoking status: Never   Smokeless tobacco: Never  Vaping Use   Vaping Use: Never used  Substance and Sexual Activity   Alcohol use: Not Currently   Drug use: Never   Sexual activity: Not Currently    Comment: raised tobacco, retired  Other Topics Concern   Not on file  Social History Narrative   Lives alone   Right handed    Caffeine: 1/2-1 cup coffee in AM   Social Determinants of Health   Financial Resource Strain: Not on file  Food Insecurity: Not on file  Transportation Needs: Not on file  Physical Activity: Not on file   Stress: Not on file  Social Connections: Not on file  Intimate Partner Violence: Not on file     Review of Systems  All other systems reviewed and are negative.     Objective:   Physical Exam Constitutional:      Appearance: She is well-developed.  Cardiovascular:     Rate and Rhythm: Normal rate and regular rhythm.     Heart sounds: Normal heart sounds.  Pulmonary:     Effort: Pulmonary effort is normal. No respiratory distress.     Breath sounds: Normal breath sounds. No stridor. No wheezing or rales.  Musculoskeletal:     Lumbar back: Tenderness and bony tenderness present. Decreased range of motion.     Right knee: Decreased range of motion. No tenderness. No MCL or LCL tenderness. No MCL laxity.     Left knee: Decreased range of motion. No tenderness.          Assessment & Plan:  PMR (polymyalgia rheumatica) (HCC) - Plan: CBC with Differential/Platelet, BASIC METABOLIC PANEL WITH GFR  Stage 3b chronic kidney disease ([place]) - Plan: CBC with Differential/Platelet, BASIC METABOLIC PANEL WITH GFR  Need for immunization against influenza - Plan: Flu Vaccine QUAD High Dose(Fluad) #1, the patient received her flu shot today  2.  I want her to resume Lexapro 10 mg a day and resume amlodipine 10 mg a day.  I feel that a lot of her anxiety and aggravation may be due to the abrupt discontinuation of Lexapro coupled with the stress ongoing at her church.  3.  I want the patient to reduce her prednisone to 10 mg a day.  I do not feel that she should be taking 20 mg indefinitely.  This is incorrect.  I will check a CBC and a BMP to monitor her renal function.

## 2020-12-19 ENCOUNTER — Other Ambulatory Visit: Payer: Self-pay | Admitting: Family Medicine

## 2020-12-19 ENCOUNTER — Telehealth: Payer: Self-pay | Admitting: *Deleted

## 2020-12-19 LAB — CBC WITH DIFFERENTIAL/PLATELET
Absolute Monocytes: 320 cells/uL (ref 200–950)
Basophils Absolute: 29 cells/uL (ref 0–200)
Basophils Relative: 0.3 %
Eosinophils Absolute: 10 cells/uL — ABNORMAL LOW (ref 15–500)
Eosinophils Relative: 0.1 %
HCT: 41.4 % (ref 35.0–45.0)
Hemoglobin: 13.7 g/dL (ref 11.7–15.5)
Lymphs Abs: 572 cells/uL — ABNORMAL LOW (ref 850–3900)
MCH: 30.8 pg (ref 27.0–33.0)
MCHC: 33.1 g/dL (ref 32.0–36.0)
MCV: 93 fL (ref 80.0–100.0)
MPV: 10.5 fL (ref 7.5–12.5)
Monocytes Relative: 3.3 %
Neutro Abs: 8769 cells/uL — ABNORMAL HIGH (ref 1500–7800)
Neutrophils Relative %: 90.4 %
Platelets: 216 10*3/uL (ref 140–400)
RBC: 4.45 10*6/uL (ref 3.80–5.10)
RDW: 12.7 % (ref 11.0–15.0)
Total Lymphocyte: 5.9 %
WBC: 9.7 10*3/uL (ref 3.8–10.8)

## 2020-12-19 LAB — BASIC METABOLIC PANEL WITH GFR
BUN/Creatinine Ratio: 15 (calc) (ref 6–22)
BUN: 19 mg/dL (ref 7–25)
CO2: 24 mmol/L (ref 20–32)
Calcium: 9.5 mg/dL (ref 8.6–10.4)
Chloride: 100 mmol/L (ref 98–110)
Creat: 1.3 mg/dL — ABNORMAL HIGH (ref 0.60–0.95)
Glucose, Bld: 111 mg/dL — ABNORMAL HIGH (ref 65–99)
Potassium: 5 mmol/L (ref 3.5–5.3)
Sodium: 137 mmol/L (ref 135–146)
eGFR: 40 mL/min/{1.73_m2} — ABNORMAL LOW (ref >=60)

## 2020-12-19 NOTE — Telephone Encounter (Signed)
Received call from patient daughter.   Reports that patient is requesting order for Rollator Walker to be faxed t [facility] in [place], [place] [phone] telephone/ [phone] fax.  Orders printed and awaiting signature.

## 2020-12-26 DIAGNOSIS — R2689 Other abnormalities of gait and mobility: Secondary | ICD-10-CM | POA: Diagnosis not present

## 2020-12-26 DIAGNOSIS — M353 Polymyalgia rheumatica: Secondary | ICD-10-CM | POA: Diagnosis not present

## 2020-12-26 NOTE — Telephone Encounter (Signed)
[facility] called to request clinical notes to go with order for rollator. Sent via [employer].

## 2021-01-08 ENCOUNTER — Ambulatory Visit: Payer: Medicare Other

## 2021-01-08 ENCOUNTER — Telehealth: Payer: Self-pay | Admitting: Pharmacist

## 2021-01-08 ENCOUNTER — Other Ambulatory Visit: Payer: Self-pay

## 2021-01-08 NOTE — Progress Notes (Addendum)
    Chronic Care Management Pharmacy Assistant   Name: [name]  MRN: [id number] DOB: 12/25/1931   Reason for Encounter: Disease State - General Adherence Call     Recent office visits:  12/18/2020 Colleen Ferrier, MD (PCP) - Family Medicine - PMR - Labs were ordered. Flu vaccine was administered. I want her to resume Lexapro 10 mg a day and resume amlodipine 10 mg a day. I want the patient to reduce her prednisone to 10 mg a day. Follow up not indicated.    Recent consult visits:  None noted.   Hospital visits:  None in previous 6 months  Medications: Outpatient Encounter Medications as of 01/08/2021  Medication Sig   ALPRAZolam (XANAX) 0.5 MG tablet Take 1 to 2 tablets by mouth every 8 hours as needed for anxiety.   amLODipine (NORVASC) 5 MG tablet Take 1 tablet by mouth every day   Camphor-Menthol-Methyl Sal (SALONPAS EX) Apply topically.   denosumab (PROLIA) 60 MG/ML SOSY injection Inject 60 mg into the skin every 6 (six) months.   diclofenac (VOLTAREN) 75 MG EC tablet Take 1 tablet by mouth twice daily   diclofenac Sodium (VOLTAREN) 1 % GEL Apply topically 4 (four) times daily.   escitalopram (LEXAPRO) 10 MG tablet Take 1 tablet by mouth every day   gabapentin (NEURONTIN) 100 MG capsule Take 1 capsule by mouth 3 times a day as needed for nerve pain   predniSONE (DELTASONE) 10 MG tablet Take 1 tablet (10 mg total) by mouth daily with breakfast. Dx: M35.3- PMR (polymyalgia rheumatica)   traMADol (ULTRAM) 50 MG tablet Take 1 tablet by mouth every 8 hours as needed   Facility-Administered Encounter Medications as of 01/08/2021  Medication   denosumab (PROLIA) injection 60 mg    Have you had any problems recently with your health? Patient reported having problems with her legs recently. She has been seeing Orthopedics for his and scheduled for imaging studies on 02/01/21.   Have you had any problems with your pharmacy? Patient denied any problems with her current  pharmacy.  What issues or side effects are you having with your medications? Patient denied any issues or side effects or issues with her current medications.   What would you like me to pass along to Erskine [name], CPP for them to help you with?  Patient did not have anything to pass along to CPP at this time.  What can we do to take care of you better? Patient did not have any recommendations at this time.   Care Gaps  AWV: overdue  Colonoscopy: unknown (aged out) DM Eye Exam: N/A DM Foot Exam:N/A Microalbumin: N/A HbgAIC: N/A DEXA: done 04/08/19 Mammogram: dpne 04/08/19  Star Rating Drugs: No Star Rating Drugs noted.   Future Appointments  Date Time Provider Department Center  01/08/2021 11:30 AM [facility] NURSE [facility] None  01/10/2021  9:45 AM Eldred Manges, MD [facility] None  04/05/2021  2:00 PM [facility] PHARMACIST [facility] None   Eugenie Filler, [facility] Clinical Pharmacist Assistant  [phone]

## 2021-01-09 ENCOUNTER — Telehealth: Payer: Self-pay | Admitting: Pharmacist

## 2021-01-09 NOTE — Progress Notes (Signed)
Duplicate, error / LS  Eugenie Filler, CCMA Clinical Pharmacist Assistant  [phone]

## 2021-01-10 ENCOUNTER — Ambulatory Visit: Payer: Self-pay

## 2021-01-10 ENCOUNTER — Encounter: Payer: Self-pay | Admitting: Orthopaedic Surgery

## 2021-01-10 ENCOUNTER — Ambulatory Visit (INDEPENDENT_AMBULATORY_CARE_PROVIDER_SITE_OTHER): Payer: Medicare Other | Admitting: Orthopaedic Surgery

## 2021-01-10 ENCOUNTER — Other Ambulatory Visit: Payer: Self-pay

## 2021-01-10 VITALS — BP 169/76 | HR 76 | Ht 63.0 in | Wt 156.0 lb

## 2021-01-10 DIAGNOSIS — M542 Cervicalgia: Secondary | ICD-10-CM | POA: Diagnosis not present

## 2021-01-10 DIAGNOSIS — M25552 Pain in left hip: Secondary | ICD-10-CM

## 2021-01-10 DIAGNOSIS — M4714 Other spondylosis with myelopathy, thoracic region: Secondary | ICD-10-CM | POA: Diagnosis not present

## 2021-01-10 DIAGNOSIS — M1712 Unilateral primary osteoarthritis, left knee: Secondary | ICD-10-CM | POA: Diagnosis not present

## 2021-01-10 DIAGNOSIS — M25562 Pain in left knee: Secondary | ICD-10-CM | POA: Diagnosis not present

## 2021-01-10 DIAGNOSIS — S22000S Wedge compression fracture of unspecified thoracic vertebra, sequela: Secondary | ICD-10-CM

## 2021-01-10 DIAGNOSIS — G8929 Other chronic pain: Secondary | ICD-10-CM | POA: Diagnosis not present

## 2021-01-10 NOTE — Progress Notes (Signed)
Office Visit Note   Patient: [name]           Date of Birth: 22-Feb-1931           MRN: [id number] Visit Date: 01/10/2021              Requested by: Donita Brooks, MD [address] [place],  [place] [address] PCP: Donita Brooks, MD   Assessment & Plan: Visit Diagnoses: Cervical spondylosis.  Myelopathy. #2.  Unilateral primary osteoarthritis left knee with hyperextension deformity.  3..  Thoracic compression fracture sequela.  4..  Thoracic spondylosis with myelopathy.  5.  History of bilateral total of arthroplasties.  Plan: Patient's had gait change with falling.  She may have some severe cervical stenosis.  We will proceed with cervical MRI scan.  We reviewed previous thoracic MRI scan, serial hip and knee radiographs.  Difficult to determine where this is related to her polymyalgia rheumatica versus myelopathic changes from cord compression.  MRI scan 2021 did show at least moderate stenosis at C3-C6 with severe biforaminal stenosis.  Follow-Up Instructions: No follow-ups on file.   Orders:  Orders Placed This Encounter  Procedures   XR HIP UNILAT W OR W/O PELVIS 2-3 VIEWS LEFT   XR Knee 1-2 Views Left   XR Cervical Spine 2 or 3 views   MR Cervical Spine w/o contrast   No orders of the defined types were placed in this encounter.     Procedures: No procedures performed   Clinical Data: No additional findings.   Subjective: Chief Complaint  Patient presents with   Right Knee - Pain   Left Knee - Pain   Right Leg - Pain    HPI 87 year old female with known diagnosis of polymyalgia rheumatica with bilateral total of arthroplasties done by Dr. Darrelyn Hillock.  She had 2 dislocations and then has a constrained prosthesis on the left.  Problems been for the last 3 months she noticed significant weakness difficulty walking and is walking with left hyperextended knee.  She was placed on prednisone by Dr. [name] and is recently has been  reduced to 10 mg.  Previous cervical MRI showed some some moderate stenosis at several levels.  Her gait changes been prominent for now 3 months.  We discussed total knee arthroplasty before COVID.  Currently she is having great difficulty getting from sitting to standing uses her walker but states she still goes outside and works on plants or bushes taking her walker with her.  She has had multiple falls in the last year likely greater than 5 last fall was 2 weeks ago.  She states when she is walking suddenly feels like her right leg buckles and she falls.  She had neurology evaluation 1 point had a thoracic MRI that showed some areas of narrowing and question syrinx in the region of T9.  I discussed on the arthroplasty with her a few years ago in August 2020.  New gait problems with lower extremity giving way has been present now for 3 months.  She has noticed sometimes dropping objects may be mild weakness in the upper extremities she has pain with rotation of her neck.  History positive for knee osteoarthritis with knee hyperextension gait.  Thoracic compression fractures.  Thoracic spondylosis with myelopathy.  Total of arthroplasties.  Stage III kidney disease.  Hypertension.  Review of Systems all other systems are noncontributory to HPI.  Objective: Vital Signs: BP (!) 169/76   Pulse 76  Ht 5\' 3"  (1.6 m)   Wt 156 lb (70.8 kg)   BMI 27.63 kg/m   Physical Exam Constitutional:      Appearance: She is well-developed.  HENT:     Head: Normocephalic.     Right Ear: External ear normal.     Left Ear: External ear normal. There is no impacted cerumen.  Eyes:     Pupils: Pupils are equal, round, and reactive to light.  Neck:     Thyroid: No thyromegaly.     Trachea: No tracheal deviation.  Cardiovascular:     Rate and Rhythm: Normal rate.  Pulmonary:     Effort: Pulmonary effort is normal.  Abdominal:     Palpations: Abdomen is soft.  Musculoskeletal:     Cervical back: No  rigidity.  Skin:    General: Skin is warm and dry.  Neurological:     Mental Status: She is alert and oriented to person, place, and time.  Psychiatric:        Behavior: Behavior normal.    Ortho Exam patient has bilateral quad weakness but can take some hand resistance right and left.  Standing position she has right knee valgus.  Hyperextends on the left knee walking with a stiff knee gait with hyperextension.  Right and left weak hip flexion and hip flexion on the right is 4- out of 5 and on the left 4+ out of 5.  Specialty Comments:  No specialty comments available.  Imaging: XR HIP UNILAT W OR W/O PELVIS 2-3 VIEWS LEFT  Result Date: 01/10/2021 AP pelvis frog-leg left hip demonstrates constrained total hip arthroplasty on the left without loosening.  Components are in satisfactory position.  Unchanged position right total of arthroplasty. Impression: Bilateral total arthroplasties without evidence of loosening.  XR Knee 1-2 Views Left  Result Date: 01/10/2021 Standing AP both knees shows valgus right knee with lateral compartment arthritis and medial compartment and lateral compartment arthritis left knee.  Lateral position shows some hyperextension with more anterior wear on the tibia. Impression: Bilateral knee osteoarthritis that is described.  XR Cervical Spine 2 or 3 views  Result Date: 01/10/2021 AP lateral cervical spine x-rays demonstrate multiple level cervical spondylosis from C3-C7 with disc base collapse anterior and posterior spurring loss of normal lordosis and uncovertebral changes. Impression: Multilevel cervical spondylosis.    PMFS History: Patient Active Problem List   Diagnosis Date Noted   Bilateral wrist pain 05/11/2020   Body mass index (BMI) 27.0-27.9, adult 05/04/2019   Thoracic spondylosis with myelopathy 05/04/2019   Proximal weakness of extremity 02/25/2019   CKD (chronic kidney disease) stage 3, GFR 30-59 ml/min (HCC)    Unilateral primary  osteoarthritis, left knee 09/16/2018   Thoracic compression fracture, sequela 09/16/2018   Hx of total hip arthroplasty, bilateral 09/16/2018   Bilateral primary osteoarthritis of knee 08/04/2018   Essential hypertension 07/08/2018   Osteoarthritis of right knee 05/25/2018   Osteopenia 09/13/2014   Fatty liver disease, nonalcoholic    Past Medical History:  Diagnosis Date   Cataract    CKD (chronic kidney disease) stage 3, GFR 30-59 ml/min (HCC)    Fatty liver disease, nonalcoholic    [id number] admitted with encephalopathy unsure of cause, resolved sponatneously   Hypertension     Family History  Problem Relation Age of Onset   Heart disease Mother    Cancer Father        metastatic unsure of primary   Early death Brother  died at 57 y/o   Liver cancer Maternal Grandmother    Healthy Son     Past Surgical History:  Procedure Laterality Date   FRACTURE SURGERY     right shoulder, both wrists    JOINT REPLACEMENT     HIP- bilateral   Social History   Occupational History   Not on file  Tobacco Use   Smoking status: Never   Smokeless tobacco: Never  Vaping Use   Vaping Use: Never used  Substance and Sexual Activity   Alcohol use: Not Currently   Drug use: Never   Sexual activity: Not Currently    Comment: raised tobacco, retired

## 2021-01-17 ENCOUNTER — Telehealth: Payer: Self-pay

## 2021-01-17 DIAGNOSIS — S86819A Strain of other muscle(s) and tendon(s) at lower leg level, unspecified leg, initial encounter: Secondary | ICD-10-CM | POA: Insufficient documentation

## 2021-01-17 DIAGNOSIS — S86112A Strain of other muscle(s) and tendon(s) of posterior muscle group at lower leg level, left leg, initial encounter: Secondary | ICD-10-CM | POA: Diagnosis not present

## 2021-01-17 DIAGNOSIS — M79662 Pain in left lower leg: Secondary | ICD-10-CM | POA: Insufficient documentation

## 2021-01-17 NOTE — Telephone Encounter (Signed)
I left voicemail requesting return call. Patient will need appointment to be seen before we are able to do xrays. Please schedule appointment if son returns call.

## 2021-01-17 NOTE — Telephone Encounter (Signed)
Pts son called into the office his mom was getting up and her left leg / ankle popped he wanted to know if they can come in and just get an xray to make sure everything is okay.   Please advise.

## 2021-01-25 ENCOUNTER — Encounter: Payer: Self-pay | Admitting: Orthopedic Surgery

## 2021-01-25 ENCOUNTER — Telehealth: Payer: Self-pay | Admitting: *Deleted

## 2021-01-25 ENCOUNTER — Ambulatory Visit (INDEPENDENT_AMBULATORY_CARE_PROVIDER_SITE_OTHER): Payer: Medicare Other | Admitting: Orthopedic Surgery

## 2021-01-25 ENCOUNTER — Other Ambulatory Visit: Payer: Self-pay

## 2021-01-25 DIAGNOSIS — M21372 Foot drop, left foot: Secondary | ICD-10-CM | POA: Diagnosis not present

## 2021-01-25 DIAGNOSIS — M5417 Radiculopathy, lumbosacral region: Secondary | ICD-10-CM

## 2021-01-25 NOTE — Progress Notes (Signed)
Office Visit Note   Patient: [name]           Date of Birth: 07/12/1931           MRN: [id number] Visit Date: 01/25/2021              Requested by: Donita BrooksPickard, Warren T, MD [address] [place],  [place] [id number] PCP: Donita BrooksPickard, Warren T, MD  Chief Complaint  Patient presents with   Left Leg - Pain   Left Ankle - Pain      HPI: Patient is an 87 year old woman who is seen for evaluation for left foot drop.  Patient states that when she was walking she felt a pop in her calf radiographs were obtained at urgent care in [place] and interpreted as negative.  Patient states she was unable to dorsiflex her foot.  Patient has had a complex past medical history she has been on prednisone for PMR currently decreased to 10 mg from 20 mg.  She has had thoracic and cervical spine MRI which has shown some stenosis.  Patient denies any upper extremity pain or weakness.  States her symptoms are focal in the left lower extremity.  Patient has had a history  Assessment & Plan: Visit Diagnoses:  1. Lumbosacral radiculopathy at L5   2. Acquired left foot drop     Plan: Patient is scheduled for an MRI scan on the 29th.  I will call and change this to an MRI scan of her lumbar spine and she will follow-up with Dr. Ophelia CharterYates after the scan.  Follow-Up Instructions: Return in about 1 week (around 02/01/2021) for Follow-up with Dr. Ophelia CharterYates after 02/01/2021.   Ortho Exam  Patient is alert, oriented, no adenopathy, well-dressed, normal affect, normal respiratory effort. Examination patient does have venous insufficiency with pitting edema, some venous ulcers in her legs brawny skin color changes but no drainage at this time.  Patient has no tenderness to palpation in the calf there is no palpable defect of the musculotendinous junction of the gastroc and the Achilles.  There is no palpable defect of the Achilles.  Compression of the calf reproduces plantarflexion of the foot.  Patient has  completely flaccid L5 nerve root distribution with no EHL strength no dorsiflexion strength of the ankle.  Patient does have plantarflexion strength.  With ambulation she circumduct her leg to clear the dropfoot from the floor.  Sed rate 9 months ago was 2 and 1 year ago was 9.  ANA markers were negative  Imaging: No results found. No images are attached to the encounter.  Labs: Lab Results  Component Value Date   ESRSEDRATE 2 04/19/2020   ESRSEDRATE 9 02/25/2019     Lab Results  Component Value Date   ALBUMIN 4.2 08/13/2016   ALBUMIN 4.1 07/14/2014    Lab Results  Component Value Date   MG 2.2 02/25/2019   No results found for: VD25OH  No results found for: PREALBUMIN CBC EXTENDED Latest Ref Rng & Units 12/18/2020 04/19/2020 11/08/2019  WBC 3.8 - 10.8 Thousand/uL 9.7 8.7 8.0  RBC 3.80 - 5.10 Million/uL 4.45 4.41 4.15  HGB 11.7 - 15.5 g/dL 19.113.7 47.813.6 29.513.1  HCT 62.135.0 - 45.0 % 41.4 41.6 39.0  PLT 140 - 400 Thousand/uL 216 188 198  NEUTROABS 1,500 - 7,800 cells/uL 8,769(H) 7,221 6,472  LYMPHSABS 850 - 3,900 cells/uL 572(L) 983 1,088     There is no height or weight on file to calculate BMI.  Orders:  No orders of the defined types were placed in this encounter.  No orders of the defined types were placed in this encounter.    Procedures: No procedures performed  Clinical Data: No additional findings.  ROS:  All other systems negative, except as noted in the HPI. Review of Systems  Objective: Vital Signs: There were no vitals taken for this visit.  Specialty Comments:  No specialty comments available.  PMFS History: Patient Active Problem List   Diagnosis Date Noted   Bilateral wrist pain 05/11/2020   Body mass index (BMI) 27.0-27.9, adult 05/04/2019   Thoracic spondylosis with myelopathy 05/04/2019   Proximal weakness of extremity 02/25/2019   CKD (chronic kidney disease) stage 3, GFR 30-59 ml/min (HCC)    Unilateral primary osteoarthritis, left knee  09/16/2018   Thoracic compression fracture, sequela 09/16/2018   Hx of total hip arthroplasty, bilateral 09/16/2018   Bilateral primary osteoarthritis of knee 08/04/2018   Essential hypertension 07/08/2018   Osteoarthritis of right knee 05/25/2018   Osteopenia 09/13/2014   Fatty liver disease, nonalcoholic    Past Medical History:  Diagnosis Date   Cataract    CKD (chronic kidney disease) stage 3, GFR 30-59 ml/min (HCC)    Fatty liver disease, nonalcoholic    2007- admitted with encephalopathy unsure of cause, resolved sponatneously   Hypertension     Family History  Problem Relation Age of Onset   Heart disease Mother    Cancer Father        metastatic unsure of primary   Early death Brother        died at 69 y/o   Liver cancer Maternal Grandmother    Healthy Son     Past Surgical History:  Procedure Laterality Date   FRACTURE SURGERY     right shoulder, both wrists    JOINT REPLACEMENT     HIP- bilateral   Social History   Occupational History   Not on file  Tobacco Use   Smoking status: Never   Smokeless tobacco: Never  Vaping Use   Vaping Use: Never used  Substance and Sexual Activity   Alcohol use: Not Currently   Drug use: Never   Sexual activity: Not Currently    Comment: raised tobacco, retired

## 2021-01-25 NOTE — Telephone Encounter (Signed)
Earlier today while pt was in office Dr. Lajoyce Corners had asked if we could get pt in sooner for MRI, I contacted [facility] and they were going to work on it and see if can get in. Just received word from imaging Per MRI dept pt declined sooner appt.  I informed Autumn F and Dr. Lajoyce Corners of this also.

## 2021-01-29 ENCOUNTER — Other Ambulatory Visit: Payer: Self-pay | Admitting: Family Medicine

## 2021-01-29 DIAGNOSIS — F5101 Primary insomnia: Secondary | ICD-10-CM

## 2021-01-30 ENCOUNTER — Ambulatory Visit (INDEPENDENT_AMBULATORY_CARE_PROVIDER_SITE_OTHER): Payer: Medicare Other

## 2021-01-30 ENCOUNTER — Other Ambulatory Visit: Payer: Self-pay

## 2021-01-30 DIAGNOSIS — M810 Age-related osteoporosis without current pathological fracture: Secondary | ICD-10-CM | POA: Diagnosis not present

## 2021-01-30 DIAGNOSIS — M8580 Other specified disorders of bone density and structure, unspecified site: Secondary | ICD-10-CM | POA: Diagnosis not present

## 2021-01-30 MED ORDER — DENOSUMAB 60 MG/ML ~~LOC~~ SOSY
60.0000 mg | PREFILLED_SYRINGE | Freq: Once | SUBCUTANEOUS | Status: AC
Start: 2021-01-30 — End: 2021-01-30
  Administered 2021-01-30: 60 mg via SUBCUTANEOUS

## 2021-01-30 NOTE — Progress Notes (Signed)
Patient seen today for PROLIA 60mg  injection. Given subq in the left arm. Patient tolerated it well. Shadowed by S. Levens CMA

## 2021-02-01 ENCOUNTER — Other Ambulatory Visit: Payer: Self-pay

## 2021-02-01 ENCOUNTER — Ambulatory Visit
Admission: RE | Admit: 2021-02-01 | Discharge: 2021-02-01 | Disposition: A | Discharge status: Home / Self Care | Payer: Medicare Other | Source: Ambulatory Visit | Attending: Orthopaedic Surgery | Admitting: Orthopaedic Surgery

## 2021-02-01 DIAGNOSIS — M5126 Other intervertebral disc displacement, lumbar region: Secondary | ICD-10-CM | POA: Diagnosis not present

## 2021-02-01 DIAGNOSIS — F5101 Primary insomnia: Secondary | ICD-10-CM

## 2021-02-01 DIAGNOSIS — M5127 Other intervertebral disc displacement, lumbosacral region: Secondary | ICD-10-CM | POA: Diagnosis not present

## 2021-02-01 DIAGNOSIS — R531 Weakness: Secondary | ICD-10-CM | POA: Diagnosis not present

## 2021-02-01 DIAGNOSIS — M48061 Spinal stenosis, lumbar region without neurogenic claudication: Secondary | ICD-10-CM | POA: Diagnosis not present

## 2021-02-01 DIAGNOSIS — M542 Cervicalgia: Secondary | ICD-10-CM

## 2021-02-01 MED ORDER — ALPRAZOLAM 0.5 MG PO TABS: ORAL_TABLET | ORAL | 5 refills | Status: DC

## 2021-02-01 NOTE — Telephone Encounter (Signed)
Xanax refill request.  Original was printed not escribed.

## 2021-02-02 ENCOUNTER — Other Ambulatory Visit: Payer: Self-pay

## 2021-02-02 ENCOUNTER — Encounter: Payer: Self-pay | Admitting: Orthopaedic Surgery

## 2021-02-02 ENCOUNTER — Ambulatory Visit (INDEPENDENT_AMBULATORY_CARE_PROVIDER_SITE_OTHER): Payer: Medicare Other | Admitting: Orthopaedic Surgery

## 2021-02-02 DIAGNOSIS — M21372 Foot drop, left foot: Secondary | ICD-10-CM

## 2021-02-02 NOTE — Progress Notes (Signed)
`  Office Visit Note   Patient: [name]           Date of Birth: 1932-02-04           MRN: [id number] Visit Date: 02/02/2021              Requested by: Donita Brooks, MD [address] [place],  [place] [address] PCP: Donita Brooks, MD   Assessment & Plan: Visit Diagnoses:  1. Left foot drop     Plan: AFO, recheck 2 weeks.  MRI scan showed no cause for her acute foot drop that began 11/2 weeks ago.  We discussed fall prevention use of the AFO recheck 2 weeks.  Follow-Up Instructions: Return in about 2 weeks (around 02/16/2021).   Orders:  No orders of the defined types were placed in this encounter.  No orders of the defined types were placed in this encounter.     Procedures: No procedures performed   Clinical Data: No additional findings.   Subjective: Chief Complaint  Patient presents with   Lower Back - Follow-up    HPI 87 year old female here with her son with problems with left foot drop present for a week and a half.  Patient been ambulating with a rolling walker and that she catches her toes at times.  She has been taking some tramadol also Voltaren.  MRI done yesterday showed multilevel degenerative changes similar to prior study with no high-grade canal stenosis there was some foraminal narrowing greatest at L3-4 but was worse on the right and only moderate on the left where she has the left-sided foot drop.  Denies any injury to her leg no trauma to the lateral aspect of her knee.  Cervical MRI March 2021 showed some moderate spinal stenosis centrally and more prominent severe foraminal stenosis.  Review of Systems negative for history of cancer.  Previous diagnosed with thoracic compression fracture remote with some spondylosis.   Objective: Vital Signs: BP (!) 143/83 (BP Location: Left Arm, Patient Position: Sitting, Cuff Size: Normal)    Pulse 89    Wt 156 lb (70.8 kg)    SpO2 93%    BMI 27.63 kg/m   Physical Exam Constitutional:       Appearance: She is well-developed.  HENT:     Head: Normocephalic.     Right Ear: External ear normal.     Left Ear: External ear normal. There is no impacted cerumen.  Eyes:     Pupils: Pupils are equal, round, and reactive to light.  Neck:     Thyroid: No thyromegaly.     Trachea: No tracheal deviation.  Cardiovascular:     Rate and Rhythm: Normal rate.  Pulmonary:     Effort: Pulmonary effort is normal.  Abdominal:     Palpations: Abdomen is soft.  Musculoskeletal:     Cervical back: No rigidity.  Skin:    General: Skin is warm and dry.  Neurological:     Mental Status: She is alert and oriented to person, place, and time.  Psychiatric:        Behavior: Behavior normal.    Ortho Exam patient has good plantar flexion right and left.  Foot drop on the left flicker EHL.  Gastrocsoleus is strong.  Quads and abductors are strong knee and ankle jerk are intact.  No hamstring weakness.  Specialty Comments:  No specialty comments available.  Imaging: No results found.   PMFS History: Patient Active Problem List  Diagnosis Date Noted   Left foot drop 02/05/2021   Bilateral wrist pain 05/11/2020   Body mass index (BMI) 27.0-27.9, adult 05/04/2019   Thoracic spondylosis with myelopathy 05/04/2019   Proximal weakness of extremity 02/25/2019   CKD (chronic kidney disease) stage 3, GFR 30-59 ml/min (HCC)    Unilateral primary osteoarthritis, left knee 09/16/2018   Thoracic compression fracture, sequela 09/16/2018   Hx of total hip arthroplasty, bilateral 09/16/2018   Bilateral primary osteoarthritis of knee 08/04/2018   Essential hypertension 07/08/2018   Osteoarthritis of right knee 05/25/2018   Osteopenia 09/13/2014   Fatty liver disease, nonalcoholic    Past Medical History:  Diagnosis Date   Cataract    CKD (chronic kidney disease) stage 3, GFR 30-59 ml/min (HCC)    Fatty liver disease, nonalcoholic    [id number]- admitted with encephalopathy unsure of cause,  resolved sponatneously   Hypertension     Family History  Problem Relation Age of Onset   Heart disease Mother    Cancer Father        metastatic unsure of primary   Early death Brother        died at 58 y/o   Liver cancer Maternal Grandmother    Healthy Son     Past Surgical History:  Procedure Laterality Date   FRACTURE SURGERY     right shoulder, both wrists    JOINT REPLACEMENT     HIP- bilateral   Social History   Occupational History   Not on file  Tobacco Use   Smoking status: Never   Smokeless tobacco: Never  Vaping Use   Vaping Use: Never used  Substance and Sexual Activity   Alcohol use: Not Currently   Drug use: Never   Sexual activity: Not Currently    Comment: raised tobacco, retired

## 2021-02-05 DIAGNOSIS — M21372 Foot drop, left foot: Secondary | ICD-10-CM | POA: Insufficient documentation

## 2021-02-16 ENCOUNTER — Other Ambulatory Visit: Payer: Self-pay | Admitting: Family Medicine

## 2021-03-02 ENCOUNTER — Telehealth: Payer: Self-pay | Admitting: Family Medicine

## 2021-03-02 NOTE — Telephone Encounter (Signed)
Left message for patient to call back and schedule Medicare Annual Wellness Visit (AWV) in office.  ° °If not able to come in office, please offer to do virtually or by telephone.  Left office number and my jabber #[phone]. ° °Due for AWVI ° °Please schedule at anytime with Nurse Health Advisor. °  °

## 2021-03-05 ENCOUNTER — Other Ambulatory Visit: Payer: Self-pay | Admitting: Family Medicine

## 2021-03-05 DIAGNOSIS — M5136 Other intervertebral disc degeneration, lumbar region: Secondary | ICD-10-CM

## 2021-03-05 NOTE — Telephone Encounter (Signed)
Tramadol refill request.  Last seen 12/18/2020, last filled 10/05/2020.

## 2021-03-07 DIAGNOSIS — M21372 Foot drop, left foot: Secondary | ICD-10-CM | POA: Diagnosis not present

## 2021-03-13 ENCOUNTER — Other Ambulatory Visit: Payer: Self-pay | Admitting: Family Medicine

## 2021-03-13 DIAGNOSIS — F5101 Primary insomnia: Secondary | ICD-10-CM

## 2021-03-13 NOTE — Telephone Encounter (Signed)
Xanax refill request to new pharmacy.  Last seen 12/18/2020, last filled 01/2021.

## 2021-03-16 ENCOUNTER — Ambulatory Visit (INDEPENDENT_AMBULATORY_CARE_PROVIDER_SITE_OTHER): Payer: Medicare Other

## 2021-03-16 ENCOUNTER — Other Ambulatory Visit: Payer: Self-pay

## 2021-03-16 VITALS — Ht 63.0 in | Wt 156.0 lb

## 2021-03-16 DIAGNOSIS — M8580 Other specified disorders of bone density and structure, unspecified site: Secondary | ICD-10-CM | POA: Diagnosis not present

## 2021-03-16 DIAGNOSIS — Z0000 Encounter for general adult medical examination without abnormal findings: Secondary | ICD-10-CM

## 2021-03-16 NOTE — Patient Instructions (Signed)
Ms. [name] , Thank you for taking time to come for your Medicare Wellness Visit. I appreciate your ongoing commitment to your health goals. Please review the following plan we discussed and let me know if I can assist you in the future.   Screening recommendations/referrals: Colonoscopy: No longer required due to age.  Mammogram: Done 04/08/2019. Please call at your convenience to schedule. Bone Density: Done 04/08/2019. Order placed today to schedule  Recommended yearly ophthalmology/optometry visit for glaucoma screening and checkup Recommended yearly dental visit for hygiene and checkup  Vaccinations: Influenza vaccine: Done 12/18/2020. Repeat annually  Pneumococcal vaccine: Done 07/12/2014. Second dose due. Tdap vaccine: Due Repeat in 10 years  Shingles vaccine: Discussed. Contact your local pharmacy for administration.   Covid-19:Done 02/26/2019, 03/19/2019 and 11/18/2019  Advanced directives: Please bring a copy of your health care power of attorney and living will to the office to be added to your chart at your convenience.   Conditions/risks identified: Aim to drink 6-8 glasses of water and eat lots of fruits and vegetables. Increase exercise as tolerated.   Next appointment: Follow up in one year for your annual wellness visit 2024.   Preventive Care 45 Years and Older, Female Preventive care refers to lifestyle choices and visits with your health care provider that can promote health and wellness. What does preventive care include? A yearly physical exam. This is also called an annual well check. Dental exams once or twice a year. Routine eye exams. Ask your health care provider how often you should have your eyes checked. Personal lifestyle choices, including: Daily care of your teeth and gums. Regular physical activity. Eating a healthy diet. Avoiding tobacco and drug use. Limiting alcohol use. Practicing safe sex. Taking low-dose aspirin every day. Taking vitamin  and mineral supplements as recommended by your health care provider. What happens during an annual well check? The services and screenings done by your health care provider during your annual well check will depend on your age, overall health, lifestyle risk factors, and family history of disease. Counseling  Your health care provider may ask you questions about your: Alcohol use. Tobacco use. Drug use. Emotional well-being. Home and relationship well-being. Sexual activity. Eating habits. History of falls. Memory and ability to understand (cognition). Work and work Astronomer. Reproductive health. Screening  You may have the following tests or measurements: Height, weight, and BMI. Blood pressure. Lipid and cholesterol levels. These may be checked every 5 years, or more frequently if you are over 58 years old. Skin check. Lung cancer screening. You may have this screening every year starting at age 61 if you have a 30-pack-year history of smoking and currently smoke or have quit within the past 15 years. Fecal occult blood test (FOBT) of the stool. You may have this test every year starting at age 53. Flexible sigmoidoscopy or colonoscopy. You may have a sigmoidoscopy every 5 years or a colonoscopy every 10 years starting at age 16. Hepatitis C blood test. Hepatitis B blood test. Sexually transmitted disease (STD) testing. Diabetes screening. This is done by checking your blood sugar (glucose) after you have not eaten for a while (fasting). You may have this done every 1-3 years. Bone density scan. This is done to screen for osteoporosis. You may have this done starting at age 60. Mammogram. This may be done every 1-2 years. Talk to your health care provider about how often you should have regular mammograms. Talk with your health care provider about your test results, treatment options,  and if necessary, the need for more tests. Vaccines  Your health care provider may recommend  certain vaccines, such as: Influenza vaccine. This is recommended every year. Tetanus, diphtheria, and acellular pertussis (Tdap, Td) vaccine. You may need a Td booster every 10 years. Zoster vaccine. You may need this after age 107. Pneumococcal 13-valent conjugate (PCV13) vaccine. One dose is recommended after age 23. Pneumococcal polysaccharide (PPSV23) vaccine. One dose is recommended after age 107. Talk to your health care provider about which screenings and vaccines you need and how often you need them. This information is not intended to replace advice given to you by your health care provider. Make sure you discuss any questions you have with your health care provider. Document Released: 02/17/2015 Document Revised: 10/11/2015 Document Reviewed: 11/22/2014 Elsevier Interactive Patient Education  2017 [place] Prevention in the Home Falls can cause injuries. They can happen to people of all ages. There are many things you can do to make your home safe and to help prevent falls. What can I do on the outside of my home? Regularly fix the edges of walkways and driveways and fix any cracks. Remove anything that might make you trip as you walk through a door, such as a raised step or threshold. Trim any bushes or trees on the path to your home. Use bright outdoor lighting. Clear any walking paths of anything that might make someone trip, such as rocks or tools. Regularly check to see if handrails are loose or broken. Make sure that both sides of any steps have handrails. Any raised decks and porches should have guardrails on the edges. Have any leaves, snow, or ice cleared regularly. Use sand or salt on walking paths during winter. Clean up any spills in your garage right away. This includes oil or grease spills. What can I do in the bathroom? Use night lights. Install grab bars by the toilet and in the tub and shower. Do not use towel bars as grab bars. Use non-skid mats or  decals in the tub or shower. If you need to sit down in the shower, use a plastic, non-slip stool. Keep the floor dry. Clean up any water that spills on the floor as soon as it happens. Remove soap buildup in the tub or shower regularly. Attach bath mats securely with double-sided non-slip rug tape. Do not have throw rugs and other things on the floor that can make you trip. What can I do in the bedroom? Use night lights. Make sure that you have a light by your bed that is easy to reach. Do not use any sheets or blankets that are too big for your bed. They should not hang down onto the floor. Have a firm chair that has side arms. You can use this for support while you get dressed. Do not have throw rugs and other things on the floor that can make you trip. What can I do in the kitchen? Clean up any spills right away. Avoid walking on wet floors. Keep items that you use a lot in easy-to-reach places. If you need to reach something above you, use a strong step stool that has a grab bar. Keep electrical cords out of the way. Do not use floor polish or wax that makes floors slippery. If you must use wax, use non-skid floor wax. Do not have throw rugs and other things on the floor that can make you trip. What can I do with my stairs? Do not leave  any items on the stairs. Make sure that there are handrails on both sides of the stairs and use them. Fix handrails that are broken or loose. Make sure that handrails are as long as the stairways. Check any carpeting to make sure that it is firmly attached to the stairs. Fix any carpet that is loose or worn. Avoid having throw rugs at the top or bottom of the stairs. If you do have throw rugs, attach them to the floor with carpet tape. Make sure that you have a light switch at the top of the stairs and the bottom of the stairs. If you do not have them, ask someone to add them for you. What else can I do to help prevent falls? Wear shoes that: Do not  have high heels. Have rubber bottoms. Are comfortable and fit you well. Are closed at the toe. Do not wear sandals. If you use a stepladder: Make sure that it is fully opened. Do not climb a closed stepladder. Make sure that both sides of the stepladder are locked into place. Ask someone to hold it for you, if possible. Clearly mark and make sure that you can see: Any grab bars or handrails. First and last steps. Where the edge of each step is. Use tools that help you move around (mobility aids) if they are needed. These include: Canes. Walkers. Scooters. Crutches. Turn on the lights when you go into a dark area. Replace any light bulbs as soon as they burn out. Set up your furniture so you have a clear path. Avoid moving your furniture around. If any of your floors are uneven, fix them. If there are any pets around you, be aware of where they are. Review your medicines with your doctor. Some medicines can make you feel dizzy. This can increase your chance of falling. Ask your doctor what other things that you can do to help prevent falls. This information is not intended to replace advice given to you by your health care provider. Make sure you discuss any questions you have with your health care provider. Document Released: 11/17/2008 Document Revised: 06/29/2015 Document Reviewed: 02/25/2014 Elsevier Interactive Patient Education  2017 [employer].

## 2021-03-16 NOTE — Progress Notes (Signed)
Subjective:   [name] is a 88 y.o. female who presents for an Initial Medicare Annual Wellness Visit. Virtual Visit via Telephone Note  I connected with  [name] on 03/16/21 at  9:00 AM EST by telephone and verified that I am speaking with the correct person using two identifiers.  Location: Patient: HOME Provider: [facility] Persons participating in the virtual visit: patient/Nurse Health Advisor   I discussed the limitations, risks, security and privacy concerns of performing an evaluation and management service by telephone and the availability of in person appointments. The patient expressed understanding and agreed to proceed.  Interactive audio and video telecommunications were attempted between this nurse and patient, however failed, due to patient having technical difficulties OR patient did not have access to video capability.  We continued and completed visit with audio only.  Some vital signs may be absent or patient reported.   Chriss Driver, LPN  Review of Systems     Cardiac Risk Factors include: advanced age (>12men, >13 women);hypertension;sedentary lifestyle  PHONE VISIT. PT AT HOME. NURSE AT [facility].    Objective:    Today's Vitals   03/16/21 0910 03/16/21 0911  Weight: 156 lb (70.8 kg)   Height: 5\' 3"  (1.6 m)   PainSc:  2    Body mass index is 27.63 kg/m.  Advanced Directives 03/16/2021  Does Patient Have a Medical Advance Directive? Yes  Type of Paramedic of [place];Living will  Copy of [place] in Chart? No - copy requested    Current Medications (verified) Outpatient Encounter Medications as of 03/16/2021  Medication Sig   ALPRAZolam (XANAX) 0.5 MG tablet Take 1 to 2 tablets by mouth every 8 hours as needed for anxiety   amLODipine (NORVASC) 5 MG tablet Take 1 tablet by mouth every day   Camphor-Menthol-Methyl Sal (SALONPAS EX) Apply topically.   denosumab (PROLIA) 60 MG/ML SOSY  injection Inject 60 mg into the skin every 6 (six) months.   diclofenac (VOLTAREN) 75 MG EC tablet Take 1 tablet by mouth twice daily   diclofenac Sodium (VOLTAREN) 1 % GEL Apply topically 4 (four) times daily.   escitalopram (LEXAPRO) 10 MG tablet Take 1 tablet by mouth every day   predniSONE (DELTASONE) 10 MG tablet Take 1 tablet (10 mg total) by mouth daily with breakfast. Dx: M35.3- PMR (polymyalgia rheumatica)   traMADol (ULTRAM) 50 MG tablet Take 1 tablet by mouth every 8 hours as needed   gabapentin (NEURONTIN) 100 MG capsule Take 1 capsule by mouth 3 times a day as needed for nerve pain (Patient not taking: Reported on 03/16/2021)   Facility-Administered Encounter Medications as of 03/16/2021  Medication   denosumab (PROLIA) injection 60 mg    Allergies (verified) Pneumococcal vaccines   History: Past Medical History:  Diagnosis Date   Cataract    CKD (chronic kidney disease) stage 3, GFR 30-59 ml/min (HCC)    Fatty liver disease, nonalcoholic    [id number] admitted with encephalopathy unsure of cause, resolved sponatneously   Hypertension    Past Surgical History:  Procedure Laterality Date   FRACTURE SURGERY     right shoulder, both wrists    JOINT REPLACEMENT     HIP- bilateral   Family History  Problem Relation Age of Onset   Heart disease Mother    Cancer Father        metastatic unsure of primary   Early death Brother  died at 86 y/o   Liver cancer Maternal Grandmother    Healthy Son    Social History   Socioeconomic History   Marital status: Widowed    Spouse name: Not on file   Number of children: 1   Years of education: Not on file   Highest education level: Not on file  Occupational History   Not on file  Tobacco Use   Smoking status: Never   Smokeless tobacco: Never  Vaping Use   Vaping Use: Never used  Substance and Sexual Activity   Alcohol use: Not Currently   Drug use: Never   Sexual activity: Not Currently    Comment: raised tobacco,  retired  Other Topics Concern   Not on file  Social History Narrative   Lives alone   Right handed    Caffeine: 1/2-1 cup coffee in AM   Social Determinants of Health   Financial Resource Strain: Low Risk    Difficulty of Paying Living Expenses: Not very hard  Food Insecurity: No Food Insecurity   Worried About Charity fundraiser in the Last Year: Never true   [place] in the Last Year: Never true  Transportation Needs: No Transportation Needs   Lack of Transportation (Medical): No   Lack of Transportation (Non-Medical): No  Physical Activity: Inactive   Days of Exercise per Week: 0 days   Minutes of Exercise per Session: 0 min  Stress: No Stress Concern Present   Feeling of Stress : Not at all  Social Connections: Moderately Integrated   Frequency of Communication with Friends and Family: More than three times a week   Frequency of Social Gatherings with Friends and Family: More than three times a week   Attends Religious Services: More than 4 times per year   Active Member of [employer] or Organizations: Yes   Attends Archivist Meetings: More than 4 times per year   Marital Status: Widowed    Tobacco Counseling Counseling given: Not Answered   Clinical Intake:  Pre-visit preparation completed: Yes  Pain : 0-10 Pain Score: 2  Pain Type: Chronic pain Pain Location: Foot Pain Orientation: Left Pain Descriptors / Indicators: Aching, Discomfort Pain Onset: More than a month ago Pain Frequency: Intermittent     BMI - recorded: 27.63 Nutritional Status: BMI 25 -29 Overweight Nutritional Risks: None Diabetes: No  How often do you need to have someone help you when you read instructions, pamphlets, or other written materials from your doctor or pharmacy?: 1 - Never  Diabetic?NO  Interpreter Needed?: No  Information entered by :: mj Delcenia Inman, lpn   Activities of Daily Living In your present state of health, do you have any difficulty performing  the following activities: 03/16/2021  Hearing? Y  Vision? N  Difficulty concentrating or making decisions? Y  Walking or climbing stairs? Y  Dressing or bathing? N  Doing errands, shopping? N  Preparing Food and eating ? N  Using the Toilet? N  In the past six months, have you accidently leaked urine? N  Do you have problems with loss of bowel control? N  Managing your Medications? N  Managing your Finances? N  Housekeeping or managing your Housekeeping? N  Some recent data might be hidden    Patient Care Team: Susy Frizzle, MD as PCP - General (Family Medicine) Edythe Clarity, [place] as Pharmacist (Pharmacist)  Indicate any recent Medical Services you may have received from other than [facility] providers in the  past year (date may be approximate).     Assessment:   This is a routine wellness examination for [place].  Hearing/Vision screen Hearing Screening - Comments:: Wears hearing aids  Vision Screening - Comments:: Glasses. Dr. Gloriann Loan in [place]. 2022.  Dietary issues and exercise activities discussed: Current Exercise Habits: The patient does not participate in regular exercise at present, Exercise limited by: cardiac condition(s);orthopedic condition(s)   Goals Addressed             This Visit's Progress    Exercise 3x per week (30 min per time)       Try Chair exercises and gentle stretching to increase mobility.       Depression Screen PHQ 2/9 Scores 03/16/2021 11/08/2019 10/31/2016  PHQ - 2 Score 0 0 6  PHQ- 9 Score - - 17    Fall Risk Fall Risk  03/16/2021 11/08/2019 02/25/2019 10/31/2016  Falls in the past year? 1 1 1  Yes  Number falls in past yr: 0 0 1 2 or more  Injury with Fall? 0 1 0 Yes  Risk Factor Category  - - - High Fall Risk  Risk for fall due to : History of fall(s);Impaired balance/gait;Impaired mobility - Impaired mobility -  Follow up Falls prevention discussed Falls evaluation completed Falls prevention discussed Falls evaluation completed     FALL RISK PREVENTION PERTAINING TO THE HOME:  Any stairs in or around the home? No  If so, are there any without handrails? No  Home free of loose throw rugs in walkways, pet beds, electrical cords, etc? Yes  Adequate lighting in your home to reduce risk of falls? Yes   ASSISTIVE DEVICES UTILIZED TO PREVENT FALLS:  Life alert? Yes  Use of a cane, walker or w/c? Yes  Grab bars in the bathroom? Yes  Shower chair or bench in shower? Yes  Elevated toilet seat or a handicapped toilet? Yes   TIMED UP AND GO:  Was the test performed? No .  Phone visit.  Cognitive Function:     6CIT Screen 03/16/2021  What Year? 0 points  What month? 0 points  What time? 0 points  Count back from 20 2 points  Months in reverse 0 points  Repeat phrase 0 points  Total Score 2    Immunizations Immunization History  Administered Date(s) Administered   Fluad Quad(high Dose 65+) 12/01/2018, 11/08/2019, 12/18/2020   Influenza, High Dose Seasonal PF 11/07/2016, 12/18/2017   Influenza,inj,Quad PF,6+ Mos 12/27/2015   PFIZER(Purple Top)SARS-COV-2 Vaccination 02/26/2019, 03/19/2019, 11/18/2019   Pneumococcal Polysaccharide-23 07/12/2014    TDAP status: Due, Education has been provided regarding the importance of this vaccine. Advised may receive this vaccine at local pharmacy or Health Dept. Aware to provide a copy of the vaccination record if obtained from local pharmacy or Health Dept. Verbalized acceptance and understanding.  Flu Vaccine status: Up to date  Pneumococcal vaccine status: Up to date  Covid-19 vaccine status: Completed vaccines  Qualifies for Shingles Vaccine? Yes   Zostavax completed No   Shingrix Completed?: No.    Education has been provided regarding the importance of this vaccine. Patient has been advised to call insurance company to determine out of pocket expense if they have not yet received this vaccine. Advised may also receive vaccine at local pharmacy or Health Dept.  Verbalized acceptance and understanding.  Screening Tests Health Maintenance  Topic Date Due   TETANUS/TDAP  Never done   Zoster Vaccines- Shingrix (1 of 2) Never done   Pneumonia  Vaccine 63+ Years old (2 - PCV) 07/12/2015   COVID-19 Vaccine (4 - Booster for Pfizer series) 01/13/2020   INFLUENZA VACCINE  Completed   DEXA SCAN  Completed   HPV VACCINES  Aged Out    Health Maintenance  Health Maintenance Due  Topic Date Due   TETANUS/TDAP  Never done   Zoster Vaccines- Shingrix (1 of 2) Never done   Pneumonia Vaccine 69+ Years old (2 - PCV) 07/12/2015   COVID-19 Vaccine (4 - Booster for Pfizer series) 01/13/2020    Colorectal cancer screening: No longer required.   Mammogram status: No longer required due to AGE.  Bone Density status: Completed 04/08/2019. Results reflect: Bone density results: OSTEOPENIA. Repeat every 2 years.  Lung Cancer Screening: (Low Dose CT Chest recommended if Age 34-80 years, 30 pack-year currently smoking OR have quit w/in 15years.) does not qualify.    Additional Screening:  Hepatitis C Screening: does not qualify.  Vision Screening: Recommended annual ophthalmology exams for early detection of glaucoma and other disorders of the eye. Is the patient up to date with their annual eye exam?  Yes  Who is the provider or what is the name of the office in which the patient attends annual eye exams? Dr. Gloriann Loan in [place]. If pt is not established with a provider, would they like to be referred to a provider to establish care? No .   Dental Screening: Recommended annual dental exams for proper oral hygiene  Community Resource Referral / Chronic Care Management: CRR required this visit?  No   CCM required this visit?  No      Plan:     I have personally reviewed and noted the following in the patients chart:   Medical and social history Use of alcohol, tobacco or illicit drugs  Current medications and supplements including opioid  prescriptions. Patient is not currently taking opioid prescriptions. Functional ability and status Nutritional status Physical activity Advanced directives List of other physicians Hospitalizations, surgeries, and ER visits in previous 12 months Vitals Screenings to include cognitive, depression, and falls Referrals and appointments  In addition, I have reviewed and discussed with patient certain preventive protocols, quality metrics, and best practice recommendations. A written personalized care plan for preventive services as well as general preventive health recommendations were provided to patient.     Chriss Driver, LPN   [id number]   Nurse Notes: Discussed Dexa. Pt would like to repeat. Order placed. Discussed Shingrix and how to obtain. 6CIT score of 2.

## 2021-03-17 ENCOUNTER — Other Ambulatory Visit: Payer: Self-pay | Admitting: Family Medicine

## 2021-03-17 DIAGNOSIS — F5101 Primary insomnia: Secondary | ICD-10-CM

## 2021-03-23 ENCOUNTER — Other Ambulatory Visit: Payer: Self-pay | Admitting: Family Medicine

## 2021-03-23 DIAGNOSIS — F5101 Primary insomnia: Secondary | ICD-10-CM

## 2021-03-25 ENCOUNTER — Other Ambulatory Visit: Payer: Self-pay | Admitting: Family Medicine

## 2021-03-25 DIAGNOSIS — F5101 Primary insomnia: Secondary | ICD-10-CM

## 2021-03-27 ENCOUNTER — Encounter: Payer: Self-pay | Admitting: Orthopaedic Surgery

## 2021-03-27 ENCOUNTER — Ambulatory Visit (INDEPENDENT_AMBULATORY_CARE_PROVIDER_SITE_OTHER): Payer: Medicare Other | Admitting: Orthopaedic Surgery

## 2021-03-27 ENCOUNTER — Other Ambulatory Visit: Payer: Self-pay

## 2021-03-27 VITALS — BP 149/77 | HR 89 | Ht 63.0 in | Wt 156.0 lb

## 2021-03-27 DIAGNOSIS — M21372 Foot drop, left foot: Secondary | ICD-10-CM | POA: Diagnosis not present

## 2021-03-27 DIAGNOSIS — M1712 Unilateral primary osteoarthritis, left knee: Secondary | ICD-10-CM

## 2021-03-27 NOTE — Telephone Encounter (Signed)
Please REFUSE  Rx sent 03/19/21 to [facility] and they have confirmed receipt.  Thanks!

## 2021-03-27 NOTE — Progress Notes (Signed)
Office Visit Note   Patient: [name]           Date of Birth: Jan 23, 1932           MRN: [id number] Visit Date: 03/27/2021              Requested by: Susy Frizzle, MD [address] [place],  [place] [address] PCP: Susy Frizzle, MD   Assessment & Plan: Visit Diagnoses:  1. Unilateral primary osteoarthritis, left knee   2. Left foot drop     Plan: We discussed total knee arthroplasty for left knee.  She has some hyperextension deformity but still is active.  She can return if she has increased symptoms and wants to reconsider total knee arthroplasty.  She is frustrated and that there is someplace that she has difficulty going having to use the rolling walker.  Follow-Up Instructions: Return if symptoms worsen or fail to improve.   Orders:  No orders of the defined types were placed in this encounter.  No orders of the defined types were placed in this encounter.     Procedures: No procedures performed   Clinical Data: No additional findings.   Subjective: Chief Complaint  Patient presents with   left foot drop    HPI patient returns AFO which is helping her walk she still using her walker has some hyperextension of her knee.  She has had thoracic compression fractures from prednisone use.  She is weaned down and is gotten totally off it but after 3 weeks states she cannot get up and move and then restarts it.  She uses her walker and still uses it when she does yard work uses a Education officer, community.  Still has pain right side of the pelvis where she slid off the bed on the wood.  She is used tramadol and currently now on 10 mg of prednisone.  Knee radiographs show hyperextension and mild varus with left knee primary osteoarthritis.  Review of Systems previous total of arthroplasties.  Hypertension stage III kidney disease.  Long-term use of prednisone for several years.   Objective: Vital Signs: BP (!) 149/77    Pulse 89    Ht 5\' 3"  (1.6 m)    Wt 156 lb  (70.8 kg)    BMI 27.63 kg/m   Physical Exam Constitutional:      Appearance: She is well-developed.  HENT:     Head: Normocephalic.     Right Ear: External ear normal.     Left Ear: External ear normal. There is no impacted cerumen.  Eyes:     Pupils: Pupils are equal, round, and reactive to light.  Neck:     Thyroid: No thyromegaly.     Trachea: No tracheal deviation.  Cardiovascular:     Rate and Rhythm: Normal rate.  Pulmonary:     Effort: Pulmonary effort is normal.  Abdominal:     Palpations: Abdomen is soft.  Musculoskeletal:     Cervical back: No rigidity.  Skin:    General: Skin is warm and dry.  Neurological:     Mental Status: She is alert and oriented to person, place, and time.  Psychiatric:        Behavior: Behavior normal.    Ortho Exam left foot drop mild hyperextension with ambulation left knee.  She has a problem with the strap at the top we will see if she can superglue the extra piece of Velcro that she picked up at  the [employer] store to help.  Specialty Comments:  No specialty comments available.  Imaging: No results found.   PMFS History: Patient Active Problem List   Diagnosis Date Noted   Left foot drop 02/05/2021   Pain of left lower leg 01/17/2021   Strain of calf muscle 01/17/2021   Bilateral wrist pain 05/11/2020   Body mass index (BMI) 27.0-27.9, adult 05/04/2019   Thoracic spondylosis with myelopathy 05/04/2019   Proximal weakness of extremity 02/25/2019   CKD (chronic kidney disease) stage 3, GFR 30-59 ml/min (HCC)    Unilateral primary osteoarthritis, left knee 09/16/2018   Thoracic compression fracture, sequela 09/16/2018   Hx of total hip arthroplasty, bilateral 09/16/2018   Bilateral primary osteoarthritis of knee 08/04/2018   Essential hypertension 07/08/2018   Osteoarthritis of right knee 05/25/2018   Knee pain, right 12/08/2017   Osteopenia 09/13/2014   Fatty liver disease, nonalcoholic    Past Medical History:  Diagnosis  Date   Cataract    CKD (chronic kidney disease) stage 3, GFR 30-59 ml/min (HCC)    Fatty liver disease, nonalcoholic    [id number] admitted with encephalopathy unsure of cause, resolved sponatneously   Hypertension     Family History  Problem Relation Age of Onset   Heart disease Mother    Cancer Father        metastatic unsure of primary   Early death Brother        died at 82 y/o   Liver cancer Maternal Grandmother    Healthy Son     Past Surgical History:  Procedure Laterality Date   FRACTURE SURGERY     right shoulder, both wrists    JOINT REPLACEMENT     HIP- bilateral   Social History   Occupational History   Not on file  Tobacco Use   Smoking status: Never   Smokeless tobacco: Never  Vaping Use   Vaping Use: Never used  Substance and Sexual Activity   Alcohol use: Not Currently   Drug use: Never   Sexual activity: Not Currently    Comment: raised tobacco, retired

## 2021-04-03 ENCOUNTER — Telehealth: Payer: Self-pay | Admitting: Pharmacist

## 2021-04-03 NOTE — Progress Notes (Signed)
° ° °  Chronic Care Management Pharmacy Assistant   Name: [name]  MRN: [id number] DOB: [birth year]   Reason for Encounter: Disease State - General Adherence Call    Recent office visits:  03/16/21 Annual Wellness Completed  Recent consult visits:  03/27/21 Annell Greening, MD - Unilateral primary osteoarthritis - Follow up as scheduled. No changes.   02/02/21 Annell Greening, MD - Left foot drop - Follow up in 2 weeks. No changes.  01/25/21 Aldean Baker, MD - Orthopedic Surgery -   Hospital visits:  None in previous 6 months  Medications: Outpatient Encounter Medications as of 04/03/2021  Medication Sig   ALPRAZolam (XANAX) 0.5 MG tablet Take 1 to 2 tablets by mouth every 8 hours as needed for anxiety   amLODipine (NORVASC) 5 MG tablet Take 1 tablet by mouth every day   Camphor-Menthol-Methyl Sal (SALONPAS EX) Apply topically.   denosumab (PROLIA) 60 MG/ML SOSY injection Inject 60 mg into the skin every 6 (six) months.   diclofenac (VOLTAREN) 75 MG EC tablet Take 1 tablet by mouth twice daily   diclofenac Sodium (VOLTAREN) 1 % GEL Apply topically 4 (four) times daily.   escitalopram (LEXAPRO) 10 MG tablet Take 1 tablet by mouth every day   gabapentin (NEURONTIN) 100 MG capsule Take 1 capsule by mouth 3 times a day as needed for nerve pain (Patient not taking: Reported on 03/16/2021)   predniSONE (DELTASONE) 10 MG tablet Take 1 tablet (10 mg total) by mouth daily with breakfast. Dx: M35.3- PMR (polymyalgia rheumatica)   traMADol (ULTRAM) 50 MG tablet Take 1 tablet by mouth every 8 hours as needed   Facility-Administered Encounter Medications as of 04/03/2021  Medication   denosumab (PROLIA) injection 60 mg    Have you had any problems recently with your health?   Have you had any problems with your pharmacy?  What issues or side effects are you having with your medications?   What would you like me to pass along to Erskine [name], CPP for them to help you with?   What can we do  to take care of you better?   Care Gaps   AWV: overdue  Colonoscopy: unknown (aged out) DM Eye Exam: N/A DM Foot Exam:N/A Microalbumin: N/A HbgAIC: N/A DEXA: done 04/08/19 Mammogram: done 04/08/19   Star Rating Drugs: Np Star Rating Drugs noted.   Future Appointments  Date Time Provider Department Center  04/05/2021  2:00 PM [facility]-CCM PHARMACIST [facility] None  03/22/2022  9:00 AM [facility]-NURSE HEALTH ADVISOR [facility] None   Multiple attempts were made to contact patient. Attempts were unsuccessful. / ls,CMA   Eugenie Filler, [facility] Clinical Pharmacist Assistant  [phone]

## 2021-04-05 ENCOUNTER — Telehealth: Payer: Medicare Other

## 2021-04-13 ENCOUNTER — Other Ambulatory Visit: Payer: Self-pay | Admitting: Family Medicine

## 2021-04-13 DIAGNOSIS — M5136 Other intervertebral disc degeneration, lumbar region: Secondary | ICD-10-CM

## 2021-04-13 NOTE — Telephone Encounter (Signed)
LOV 12/18/20 ?Last refill 03/05/21, #60, 0 refills ? ?This refill came over with 11 refills requested.  ? ?Please review, thanks! ? ?

## 2021-04-19 ENCOUNTER — Telehealth: Payer: Self-pay | Admitting: Pharmacist

## 2021-04-19 NOTE — Progress Notes (Signed)
? ? ?  Chronic Care Management ?Pharmacy Assistant  ? ?Name: [name]  MRN: [id number] DOB: 1931/11/18 ? ? ?Reason for Encounter: Disease State - General Adherence Call  ?  ? ? ?Recent office visits:  ?None noted.  ? ?Recent consult visits:  ?None noted.  ? ?Hospital visits:  ?None in previous 6 months ? ? ?Medications: ?Outpatient Encounter Medications as of 04/19/2021  ?Medication Sig  ? ALPRAZolam (XANAX) 0.5 MG tablet Take 1 to 2 tablets by mouth every 8 hours as needed for anxiety  ? amLODipine (NORVASC) 5 MG tablet Take 1 tablet by mouth every day  ? Camphor-Menthol-Methyl Sal (SALONPAS EX) Apply topically.  ? denosumab (PROLIA) 60 MG/ML SOSY injection Inject 60 mg into the skin every 6 (six) months.  ? diclofenac (VOLTAREN) 75 MG EC tablet Take 1 tablet by mouth twice daily  ? diclofenac Sodium (VOLTAREN) 1 % GEL Apply topically 4 (four) times daily.  ? escitalopram (LEXAPRO) 10 MG tablet Take 1 tablet by mouth every day  ? gabapentin (NEURONTIN) 100 MG capsule Take 1 capsule by mouth 3 times a day as needed for nerve pain (Patient not taking: Reported on 03/16/2021)  ? predniSONE (DELTASONE) 10 MG tablet Take 1 tablet (10 mg total) by mouth daily with breakfast. Dx: M35.3- PMR (polymyalgia rheumatica)  ? traMADol (ULTRAM) 50 MG tablet Take 1 tablet by mouth every 8 hours as needed  ? ?Facility-Administered Encounter Medications as of 04/19/2021  ?Medication  ? denosumab (PROLIA) injection 60 mg  ? ? ?Have you had any problems recently with your health? ?  ?  ?Have you had any problems with your pharmacy? ? ?  ?What issues or side effects are you having with your medications? ?  ?  ?What would you like me to pass along to Erskine [name], CPP for them to help you with?  ? ?  ?What can we do to take care of you better? ?  ?  ?Care Gaps ?  ?AWV: done 03/16/21 ?Colonoscopy: unknown (aged out) ?DM Eye Exam: N/A ?DM Foot Exam:N/A ?Microalbumin: N/A ?HbgAIC: N/A ?DEXA: done 04/08/19 ?Mammogram: done 04/08/19 ?  ?  ?Star  Rating Drugs: ?No Star Rating Drugs noted.  ? ? ?Future Appointments  ?Date Time Provider Department Center  ?03/22/2022  9:00 AM [facility]-NURSE HEALTH ADVISOR [facility]  ? ?Multiple attempts were made to contact patient. Attempts were unsuccessful. / ls,CMA  ? ?[name], CCMA ?Clinical Pharmacist Assistant  ?[phone] ? ? ?

## 2021-05-02 ENCOUNTER — Telehealth: Payer: Self-pay | Admitting: Family Medicine

## 2021-05-02 NOTE — Telephone Encounter (Signed)
Patient called to request refill of ? ?traMADol (ULTRAM) 50 MG tablet [[id number]]  ? ?Pharmacy confirmed as ? ?[facility] [address] [place] - [address]  ?[address] [place] [address]  ?Phone:  [phone]  Fax:  [phone]  ? ?Please advise at [phone] ? ? ?

## 2021-05-03 ENCOUNTER — Telehealth: Payer: Self-pay

## 2021-05-03 NOTE — Telephone Encounter (Signed)
Called patient requested refills of Xanax and Tramadol to be sent to [facility] in [place]

## 2021-05-03 NOTE — Telephone Encounter (Signed)
Pt called wanting to speak with the pharmacist about some med info. Pt would like a call back please. ? ?Cb#: [phone] ?

## 2021-05-03 NOTE — Telephone Encounter (Signed)
Pt aware meds at pharmacy.  ?

## 2021-05-04 ENCOUNTER — Other Ambulatory Visit: Payer: Self-pay | Admitting: Family Medicine

## 2021-05-04 DIAGNOSIS — M5136 Other intervertebral disc degeneration, lumbar region: Secondary | ICD-10-CM

## 2021-05-04 MED ORDER — TRAMADOL HCL 50 MG PO TABS: 50.0000 mg | ORAL_TABLET | Freq: Three times a day (TID) | ORAL | 2 refills | Status: DC | PRN

## 2021-05-04 NOTE — Telephone Encounter (Signed)
Per mail order pharmacy Tramadol was filled 04/10/21 for #60 and is not due for refill yet. ? ?Per Gerald Stabs, patient is out of medication and cannot wait until next scheduled shipment on 05/15/21.  ? ?This rx is written as PRN, however Gerald Stabs states patient seems to be taking this Q8H daily and this is why she has run out of medication. ? ?Patient is asking if refill can be sent to [facility] so she can have medication until her next shipment from mail order.  ? ?Please advise on sig for Tramadol. Thanks! ?

## 2021-05-07 ENCOUNTER — Telehealth: Payer: Self-pay

## 2021-05-07 NOTE — Telephone Encounter (Signed)
RX sent to pharmacy 05/04/21 ?

## 2021-05-08 ENCOUNTER — Telehealth: Payer: Self-pay

## 2021-05-08 NOTE — Telephone Encounter (Signed)
Pharmacist from [place] called to advise they cannot fill Tramadol without verbal approval as it has already been filled by [facility]. Advised pharmacist we are aware and have sent to [facility] due to shipping delay from mail order. Verbal approval given for ONE fill of Tramadol with NO REFILLS at [facility]. She is deleting refills that came with rx.  ? ?Nothing further needed at this time.  ? ?

## 2021-05-08 NOTE — Telephone Encounter (Signed)
Christie w/[facility] would like to speak with you about pt.  ? ? ?

## 2021-05-08 NOTE — Telephone Encounter (Signed)
Erroneous encounter. Please disregard.

## 2021-05-09 NOTE — Telephone Encounter (Signed)
Called to discuss with pharmacist

## 2021-05-10 NOTE — Telephone Encounter (Signed)
[name] already called Pharmacist ?

## 2021-05-14 ENCOUNTER — Ambulatory Visit (INDEPENDENT_AMBULATORY_CARE_PROVIDER_SITE_OTHER): Payer: Medicare Other | Admitting: Family Medicine

## 2021-05-14 VITALS — BP 128/68 | HR 84 | Temp 96.9°F | Ht 63.0 in | Wt 151.0 lb

## 2021-05-14 DIAGNOSIS — N1832 Chronic kidney disease, stage 3b: Secondary | ICD-10-CM | POA: Diagnosis not present

## 2021-05-14 DIAGNOSIS — M5136 Other intervertebral disc degeneration, lumbar region: Secondary | ICD-10-CM

## 2021-05-14 DIAGNOSIS — I10 Essential (primary) hypertension: Secondary | ICD-10-CM

## 2021-05-14 DIAGNOSIS — F5101 Primary insomnia: Secondary | ICD-10-CM | POA: Diagnosis not present

## 2021-05-14 DIAGNOSIS — M353 Polymyalgia rheumatica: Secondary | ICD-10-CM | POA: Diagnosis not present

## 2021-05-14 DIAGNOSIS — M51369 Other intervertebral disc degeneration, lumbar region without mention of lumbar back pain or lower extremity pain: Secondary | ICD-10-CM

## 2021-05-14 DIAGNOSIS — R1031 Right lower quadrant pain: Secondary | ICD-10-CM

## 2021-05-14 DIAGNOSIS — R29898 Other symptoms and signs involving the musculoskeletal system: Secondary | ICD-10-CM

## 2021-05-14 LAB — CBC WITH DIFFERENTIAL/PLATELET
Absolute Monocytes: 558 cells/uL (ref 200–950)
Basophils Absolute: 48 cells/uL (ref 0–200)
Basophils Relative: 0.7 %
Eosinophils Absolute: 102 cells/uL (ref 15–500)
Eosinophils Relative: 1.5 %
HCT: 43.5 % (ref 35.0–45.0)
Hemoglobin: 14.1 g/dL (ref 11.7–15.5)
Lymphs Abs: 1775 cells/uL (ref 850–3900)
MCH: 30.5 pg (ref 27.0–33.0)
MCHC: 32.4 g/dL (ref 32.0–36.0)
MCV: 94.2 fL (ref 80.0–100.0)
MPV: 10.5 fL (ref 7.5–12.5)
Monocytes Relative: 8.2 %
Neutro Abs: 4318 cells/uL (ref 1500–7800)
Neutrophils Relative %: 63.5 %
Platelets: 215 10*3/uL (ref 140–400)
RBC: 4.62 10*6/uL (ref 3.80–5.10)
RDW: 12.4 % (ref 11.0–15.0)
Total Lymphocyte: 26.1 %
WBC: 6.8 10*3/uL (ref 3.8–10.8)

## 2021-05-14 LAB — COMPLETE METABOLIC PANEL WITH GFR
AG Ratio: 1.9 (calc) (ref 1.0–2.5)
ALT: 18 U/L (ref 6–29)
AST: 22 U/L (ref 10–35)
Albumin: 4.2 g/dL (ref 3.6–5.1)
Alkaline phosphatase (APISO): 53 U/L (ref 37–153)
BUN/Creatinine Ratio: 18 (calc) (ref 6–22)
BUN: 19 mg/dL (ref 7–25)
CO2: 30 mmol/L (ref 20–32)
Calcium: 9.1 mg/dL (ref 8.6–10.4)
Chloride: 105 mmol/L (ref 98–110)
Creat: 1.07 mg/dL — ABNORMAL HIGH (ref 0.60–0.95)
Globulin: 2.2 g/dL (calc) (ref 1.9–3.7)
Glucose, Bld: 98 mg/dL (ref 65–99)
Potassium: 4.2 mmol/L (ref 3.5–5.3)
Sodium: 141 mmol/L (ref 135–146)
Total Bilirubin: 0.5 mg/dL (ref 0.2–1.2)
Total Protein: 6.4 g/dL (ref 6.1–8.1)
eGFR: 50 mL/min/{1.73_m2} — ABNORMAL LOW (ref >=60)

## 2021-05-14 LAB — SEDIMENTATION RATE: Sed Rate: 11 mm/h (ref 0–30)

## 2021-05-14 MED ORDER — ALPRAZOLAM 0.5 MG PO TABS: 0.5000 mg | ORAL_TABLET | Freq: Three times a day (TID) | ORAL | 0 refills | Status: DC | PRN

## 2021-05-14 MED ORDER — TRAMADOL HCL 50 MG PO TABS: 50.0000 mg | ORAL_TABLET | Freq: Four times a day (QID) | ORAL | 2 refills | Status: DC | PRN

## 2021-05-14 NOTE — Progress Notes (Signed)
? ?Subjective:  ? ? Patient ID: [name], female    DOB: March 28, 1931, 88 y.o.   MRN: [id number] ? ?HPI ?9/19 ?Patient presents today complaining of elevated blood pressure.  She states that over the weekend, her blood pressure was as high as 170 systolic over 90 diastolic.  It has been steadily rising over the last few weeks.  She denies any chest pain shortness of breath or dyspnea on exertion.  She became alarmed and therefore this weekend she took 1 of her old blood pressure medications, losartan HCTZ.  Her blood pressure today is well controlled however she has taken her old medication twice over the weekend.  We previously discontinued the medication due to acute kidney injury in the early summer with a rise in her creatinine to 1.73 as well as an elevated potassium of 5.9.  Even after discontinuing losartan HCTZ, her potassium remain borderline elevated at 5.2. ? ?Patient also complains of knee pain and ankle pain.  Early this summer, she tripped over an exposed pipe trading from the wall of her home striking her right shin as she walked by.  She fell on her lateral right ankle as well as her right knee.  Ever since that time, she complains of pain and stiffness in the right knee particularly in the posterior lateral aspect of the right knee as well as swelling behind the knee in the popliteal fossa.  She also complains of pain distal and anterior to the lateral malleolus in the area of the anterior talofibular ligament.  Initially she can barely put weight on her foot however over the last few weeks, the pain has improved and subsided.  She still has pain with ambulation.  She also reports subjective swelling around the right knee.  At that time, my plan was: ?DC losartan HCTZ due to her history of acute kidney injury as well as hyperkalemia.  Replace with amlodipine 5 mg a day and monitor her blood pressure.  I believe the pain in her right knee is likely due to the fall, osteoarthritis, and possibly  a lateral meniscal tear although the exam is not specific today.  She cannot tolerate NSAIDs due to her mild chronic kidney disease.  Therefore I offered the patient a cortisone injection in the right knee.  She politely declined as the pain is not that severe.  I believe the pain in her right ankle is likely due to a sprain in the anterior talofibular ligament.  This is slowly improving.  I recommended the patient wear an ASO brace with ambulation to provide more ankle support and reassess in 2 to 3 weeks or sooner if worsening.  Patient is bearing weight without any difficulty and therefore I believe the fracture is unlikely. ? ?12/01/18 ?Patient presents today complaining of problems in both her back and her legs.  She saw my partner in June who obtained a CT scan of the lumbar spine.  The results of the CT scan are included below: ? ?IMPRESSION: ?Mild inferior endplate compression fracture of T11 and superior ?endplate compression fracture of T12 appear late subacute to remote. ?The fractures are most in keeping with senile ?osteoporotic/posttraumatic injuries. ?  ?Calcified right subarticular recess and foraminal protrusion at ?T11-12 could impact the exiting right T11 and descending right T12 ?roots. ?  ?Scattered foraminal narrowing appears worst at L3-4 where it is ?moderate to moderately severe and greater on the right. ? ?Patient was referred to orthopedic surgery.  They have recommended bilateral knee  replacements due to osteoarthritis in the knees.  However today the patient's history seems confusing to me.  She denies any knee pain.  She does report audible crepitus in the knee however the majority of the pain is in her lower back.  She also complains of severe weakness in the legs.  Muscle strength is 5/5 equal and symmetric with knee extension and knee flexion as well as ankle dorsiflexion and ankle plantarflexion however hip flexion is extremely weak and is at best 3/5.  Hip extension is also weak  and is at best 4/5.  I am also unable to elicit any reflexes in either knee at the patella.  Patient states that as soon she stands her legs feel extremely weak.  I performed time stand up and go test.  The patient can only push herself to a standing position using her arms.  Her legs wobble and sway the entire time as if she is barely able to support her weight.  I believe that deconditioning has led to severe muscle atrophy in the proximal hip muscles.  Patient reports that she is also falling and feels unsteady on her feet.  She is having to use a walker.  However the majority of the pains in her lower back.  At that time, my plan was: ?Patient has significant leg weakness.  Some of this could be due to deconditioning however I am concerned about possible neurogenic claudication.  I have recommended physical therapy consultation at home to try to improve the strength in her proximal muscles in her legs to reduce her risk of following.  Meanwhile I will start the patient on a prednisone taper pack for what I suspect a spinal stenosis.  Recheck the patient in 1 week.  If the pain in her lower back is improving I would recommend an MRI of her back at that time to determine if she would benefit from epidural steroid injections and to determine the level where she would benefit the most.  Her exam today suggest more of spinal stenosis with resultant leg weakness. ? ?12/08/18 ? ?Patient presents today for follow-up.  She saw some improvement on the prednisone perhaps 25% improvement.  She reports good days and bad days however today is a bad day.  She continues to report severe pain in the center of her back.  She also continues to demonstrate proximal leg muscle weakness.  She has barely able to stand from a seated position and this is only with holding onto a walker.  She is now having to use a walker simply to walk.  She does not feel deconditioning is causing this because she states that she is active every day  cleaning her house and using a leaf blower however she progressively is getting weaker and weaker and is requiring a walker now to walk.  She also reports some numbness and burning in both feet.  Both legs frequently give out on her and she has fallen several times.  I am concerned about neurogenic claudication.  She also continues to have severe pain in both knees secondary to osteoarthritis of the knees however my biggest concern is her progressive muscle weakness in both legs.  At that time, my plan was: ?Leg weakness seems to be progressively worsening.  I am concerned about neurogenic claudication.  She has yet to hear from physical therapy however she is falling more frequently and is now having to use a walker simply to get out of a chair.  Therefore  I will proceed with an MRI of the lumbar spine to evaluate further.  If there is significant lumbar spinal stenosis, I would recommend a referral to neurosurgery to discuss treatment options or perhaps epidural steroid injections particular to improve her leg weakness.  Patient was also recommended not to take any further NSAIDs as her lab work shows chronic kidney disease and the NSAIDs are likely exacerbating this.  She can use tramadol.  Await the results of the MRI ? ?12/17/18 ?MRI revealed: ? ?L3-4: 5 mm retrolisthesis with disc and facet degeneration. Diffuse ?endplate spurring. Severe subarticular and foraminal stenosis on the ?right. Moderate subarticular and foraminal stenosis on the left. ?Spinal canal adequate in size ?  ?L4-5: Disc degeneration and spurring. Moderate subarticular stenosis ?on the right due to spurring ?  ?L5-S1: Disc degeneration with endplate spurring. Moderate ?subarticular and foraminal stenosis bilaterally due to spurring. ? ?Patient is here today with her son to discuss the findings.  Her biggest concern is her inability to walk.  She states that she has a difficult time standing from a seated position due to the weakness in her  legs.  When she does stand, her legs feel extremely wobbly and feel like they are going to give way on her.  After standing for a short period of time she has to sit down because of the weakness in her legs

## 2021-05-22 ENCOUNTER — Telehealth: Payer: Self-pay | Admitting: Pharmacist

## 2021-05-22 NOTE — Progress Notes (Signed)
? ? ?Chronic Care Management ?Pharmacy Assistant  ? ?Name: [name]  MRN: [id number] DOB: [birth year] ? ? ?Reason for Encounter: Disease State - General Adherence Call  ?  ? ?Recent office visits:  ?05/14/21 [name] MD - Family Medicine - PMR - Labs were ordered. Referral to Physical Therapy placed. CT abdomen and pelvis without contrast ordered. ALPRAZolam (XANAX) 0.5 MG tablet and traMADol (ULTRAM) 50 MG tablet ordered. She wants to take diclofenac more frequently.  Instead we will increase tramadol to 50 mg every 6 hours, 120 tablets/month due to her chronic kidney disease. Follow up as scheduled.  ? ?Recent consult visits:  ?None noted.  ? ?Hospital visits:  ?None in previous 6 months ? ?Medications: ?Outpatient Encounter Medications as of 05/22/2021  ?Medication Sig  ? ALPRAZolam (XANAX) 0.5 MG tablet Take 1 tablet (0.5 mg total) by mouth 3 (three) times daily as needed for anxiety.  ? amLODipine (NORVASC) 5 MG tablet Take 1 tablet by mouth every day  ? Camphor-Menthol-Methyl Sal (SALONPAS EX) Apply topically.  ? denosumab (PROLIA) 60 MG/ML SOSY injection Inject 60 mg into the skin every 6 (six) months.  ? diclofenac (VOLTAREN) 75 MG EC tablet Take 1 tablet by mouth twice daily  ? diclofenac Sodium (VOLTAREN) 1 % GEL Apply topically 4 (four) times daily. (Patient not taking: Reported on 05/14/2021)  ? escitalopram (LEXAPRO) 10 MG tablet Take 1 tablet by mouth every day (Patient not taking: Reported on 05/14/2021)  ? gabapentin (NEURONTIN) 100 MG capsule Take 1 capsule by mouth 3 times a day as needed for nerve pain (Patient not taking: Reported on 05/14/2021)  ? predniSONE (DELTASONE) 10 MG tablet Take 1 tablet (10 mg total) by mouth daily with breakfast. Dx: M35.3- PMR (polymyalgia rheumatica)  ? traMADol (ULTRAM) 50 MG tablet Take 1 tablet (50 mg total) by mouth every 6 (six) hours as needed.  ? ?Facility-Administered Encounter Medications as of 05/22/2021  ?Medication  ? denosumab (PROLIA) injection  60 mg  ? ? ?Have you had any problems recently with your health? ?Patient reported she is still dealing with abdominal pain and awaiting to be contacted and scheduled for her CT scan. She states it has not worsened but is still about the same.  ?  ?Have you had any problems with your pharmacy? ?Patient denied any problems with her current pharmacy.  ?  ?What issues or side effects are you having with your medications? ? Patient denied any issues or side effects from her current medications.  ?  ?What would you like me to pass along to Erskine [name], CPP for them to help you with?  ?Patient did not have anything to pass along to CPP at this time. She is just awaiting to have her imaging scheduled so she can hopefully find out what is going on with her.  ?  ?What can we do to take care of you better? ? Patient reported she had not heard from her CT scan. I confirmed the order had been placed. I advised her to contact Dr Caren Macadam office to follow up on progress with this to get this scheduled. She voiced understanding.  ?  ?Care Gaps ?  ?AWV: done 03/16/21 ?Colonoscopy: unknown (aged out) ?DM Eye Exam: N/A ?DM Foot Exam:N/A ?Microalbumin: N/A ?HbgAIC: N/A ?DEXA: done 04/08/19 / has been ordered ?Mammogram: done 04/08/19 / has been ordered ?  ?  ?Star Rating Drugs: ?No Star Rating Drugs noted.  ? ? ? ?Future Appointments  ?Date Time Provider  Department Center  ?03/22/2022  9:00 AM BSF[facility]-NURSE HEALTH ADVISOR [facility]  ? ? ?Liza Showfety, CCMA ?Clinical Pharmacist Assistant  ?[phone] ? ? ?

## 2021-05-24 ENCOUNTER — Telehealth: Payer: Self-pay

## 2021-05-24 NOTE — Telephone Encounter (Signed)
Pt called in inquiring about scheduling an appt for her CT scan. Pt is unsure if she should just go to [facility] for this, or does she have to make an appt? Pt would like a call concerning this please. ? ?Cb#: [phone] ?

## 2021-05-30 ENCOUNTER — Ambulatory Visit (HOSPITAL_COMMUNITY)
Admission: RE | Admit: 2021-05-30 | Discharge: 2021-05-30 | Disposition: A | Discharge status: Home / Self Care | Payer: Medicare Other | Source: Ambulatory Visit | Attending: Family Medicine | Admitting: Family Medicine

## 2021-05-30 DIAGNOSIS — R1031 Right lower quadrant pain: Secondary | ICD-10-CM | POA: Diagnosis not present

## 2021-05-30 DIAGNOSIS — I700 Atherosclerosis of aorta: Secondary | ICD-10-CM | POA: Diagnosis not present

## 2021-05-30 DIAGNOSIS — R109 Unspecified abdominal pain: Secondary | ICD-10-CM | POA: Diagnosis not present

## 2021-06-07 ENCOUNTER — Other Ambulatory Visit: Payer: Self-pay | Admitting: Family Medicine

## 2021-06-07 NOTE — Telephone Encounter (Signed)
Requested Prescriptions  ?Pending Prescriptions Disp Refills  ?? diclofenac (VOLTAREN) 75 MG EC tablet [Pharmacy Med Name: Diclofenac Sodium 75 MG Oral Tablet Delayed Release] 60 tablet 0  ?  Sig: Take 1 tablet by mouth twice daily  ?  ? Analgesics:  NSAIDS Failed - 06/07/2021  1:09 PM  ?  ?  Failed - Manual Review: Labs are only required if the patient has taken medication for more than 8 weeks.  ?  ?  Failed - Cr in normal range and within 360 days  ?  Creat  ?Date Value Ref Range Status  ?05/14/2021 1.07 (H) 0.60 - 0.95 mg/dL Final  ?   ?  ?  Passed - HGB in normal range and within 360 days  ?  Hemoglobin  ?Date Value Ref Range Status  ?05/14/2021 14.1 11.7 - 15.5 g/dL Final  ?02/25/2019 14.1 11.1 - 15.9 g/dL Final  ?   ?  ?  Passed - PLT in normal range and within 360 days  ?  Platelets  ?Date Value Ref Range Status  ?05/14/2021 215 140 - 400 Thousand/uL Final  ?02/25/2019 253 150 - 450 x10E3/uL Final  ?   ?  ?  Passed - HCT in normal range and within 360 days  ?  HCT  ?Date Value Ref Range Status  ?05/14/2021 43.5 35.0 - 45.0 % Final  ? ?Hematocrit  ?Date Value Ref Range Status  ?02/25/2019 43.1 34.0 - 46.6 % Final  ?   ?  ?  Passed - eGFR is 30 or above and within 360 days  ?  GFR, Est African American  ?Date Value Ref Range Status  ?04/19/2020 54 (L) > OR = 60 mL/min/1.71m2 Final  ? ?GFR, Est Non African American  ?Date Value Ref Range Status  ?04/19/2020 47 (L) > OR = 60 mL/min/1.94m2 Final  ? ?eGFR  ?Date Value Ref Range Status  ?05/14/2021 50 (L) > OR = 60 mL/min/1.89m2 Final  ?  Comment:  ?  The eGFR is based on the CKD-EPI 2021 equation. To calculate  ?the new eGFR from a previous Creatinine or Cystatin C ?result, go to https://www.kidney.org/professionals/ ?kdoqi/gfr%5Fcalculator ?  ?   ?  ?  Passed - Patient is not pregnant  ?  ?  Passed - Valid encounter within last 12 months  ?  Recent Outpatient Visits   ?      ? 3 weeks ago PMR (polymyalgia rheumatica) ([place])  ? [facility],  Cammie Mcgee, MD  ? 5 months ago PMR (polymyalgia rheumatica) ([place])  ? [facility] Pickard, Cammie Mcgee, MD  ? 1 year ago PMR (polymyalgia rheumatica) ([place])  ? [facility] Pickard, Cammie Mcgee, MD  ? 1 year ago Stage 3b chronic kidney disease [facility])  ? [facility] Pickard, Cammie Mcgee, MD  ? 1 year ago Bilateral leg weakness  ? [facility]ard, Cammie Mcgee, MD  ?  ?  ? ?  ?  ?  ? ?

## 2021-06-18 ENCOUNTER — Other Ambulatory Visit: Payer: Self-pay | Admitting: Family Medicine

## 2021-06-19 NOTE — Telephone Encounter (Signed)
Spoke with pt, verify pharmacy for meds. Pt aware its at [facility] in [place] ?

## 2021-06-19 NOTE — Telephone Encounter (Signed)
Attempted to call patient to verify Rx needed from local pharmacy- left message to call back. Rx filled for patient ?Requested Prescriptions  ?Pending Prescriptions Disp Refills  ?? escitalopram (LEXAPRO) 10 MG tablet [Pharmacy Med Name: Escitalopram Oxalate 10 MG Oral Tablet] 90 tablet 0  ?  Sig: Take 1 tablet by mouth once daily  ?  ? Psychiatry:  Antidepressants - SSRI Passed - 06/18/2021 12:25 PM  ?  ?  Passed - Valid encounter within last 6 months  ?  Recent Outpatient Visits   ?      ? 1 month ago PMR (polymyalgia rheumatica) ([place])  ? [facility]ard, Cammie Mcgee, MD  ? 6 months ago PMR (polymyalgia rheumatica) ([place])  ? [facility]ard, Cammie Mcgee, MD  ? 1 year ago PMR (polymyalgia rheumatica) ([place])  ? [facility] Pickard, Cammie Mcgee, MD  ? 1 year ago Stage 3b chronic kidney disease [facility])  ? [facility] Pickard, Cammie Mcgee, MD  ? 1 year ago Bilateral leg weakness  ? [facility] Pickard, Cammie Mcgee, MD  ?  ?  ? ?  ?  ?  ? ?

## 2021-06-20 ENCOUNTER — Other Ambulatory Visit: Payer: Self-pay | Admitting: Family Medicine

## 2021-06-20 NOTE — Telephone Encounter (Signed)
Call to patient- she is changing pharmacy and want to forward her medications- patient informed- I would transfer remaining RF - but her provider will have to handle to non delegated Rx ?Requested Prescriptions  ?Pending Prescriptions Disp Refills  ?? predniSONE (DELTASONE) 10 MG tablet [Pharmacy Med Name: predniSONE 10 MG Oral Tablet] 60 tablet 0  ?  Sig: TAKE 2 TABLETS BY MOUTH ONCE DAILY WITH BREAKFAST  ?  ? Not Delegated - Endocrinology:  Oral Corticosteroids Failed - 06/20/2021  9:54 AM  ?  ?  Failed - This refill cannot be delegated  ?  ?  Failed - Manual Review: Eye exam for IOP if prolonged treatment  ?  ?  Failed - Bone Mineral Density or Dexa Scan completed in the last 2 years  ?  ?  Passed - Glucose (serum) in normal range and within 180 days  ?  Glucose, Bld  ?Date Value Ref Range Status  ?05/14/2021 98 65 - 99 mg/dL Final  ?  Comment:  ?  . ?           Fasting reference interval ?. ?  ?   ?  ?  Passed - K in normal range and within 180 days  ?  Potassium  ?Date Value Ref Range Status  ?05/14/2021 4.2 3.5 - 5.3 mmol/L Final  ?   ?  ?  Passed - Na in normal range and within 180 days  ?  Sodium  ?Date Value Ref Range Status  ?05/14/2021 141 135 - 146 mmol/L Final  ?   ?  ?  Passed - Last BP in normal range  ?  BP Readings from Last 1 Encounters:  ?05/14/21 128/68  ?   ?  ?  Passed - Valid encounter within last 6 months  ?  Recent Outpatient Visits   ?      ? 1 month ago PMR (polymyalgia rheumatica) ([place])  ? [facility], Cammie Mcgee, MD  ? 6 months ago PMR (polymyalgia rheumatica) ([place])  ? [facility] Pickard, Cammie Mcgee, MD  ? 1 year ago PMR (polymyalgia rheumatica) ([place])  ? [facility] Pickard, Cammie Mcgee, MD  ? 1 year ago Stage 3b chronic kidney disease [facility])  ? [facility] Pickard, Cammie Mcgee, MD  ? 1 year ago Bilateral leg weakness  ? [facility] Pickard, Cammie Mcgee, MD  ?  ?  ? ?  ?  ?  ?? amLODipine (NORVASC) 5 MG tablet  [Pharmacy Med Name: amLODIPine Besylate 5 MG Oral Tablet] 90 tablet 1  ?  Sig: Take 1 tablet by mouth once daily  ?  ? Cardiovascular: Calcium Channel Blockers 2 Passed - 06/20/2021  9:54 AM  ?  ?  Passed - Last BP in normal range  ?  BP Readings from Last 1 Encounters:  ?05/14/21 128/68  ?   ?  ?  Passed - Last Heart Rate in normal range  ?  Pulse Readings from Last 1 Encounters:  ?05/14/21 84  ?   ?  ?  Passed - Valid encounter within last 6 months  ?  Recent Outpatient Visits   ?      ? 1 month ago PMR (polymyalgia rheumatica) ([place])  ? [facility] Pickard, Cammie Mcgee, MD  ? 6 months ago PMR (polymyalgia rheumatica) ([place])  ? [facility] Pickard, Cammie Mcgee, MD  ? 1 year ago PMR (polymyalgia  rheumatica) ([place])  ? [facility] Pickard, Cammie Mcgee, MD  ? 1 year ago Stage 3b chronic kidney disease [facility])  ? [facility] Pickard, Cammie Mcgee, MD  ? 1 year ago Bilateral leg weakness  ? [facility] Pickard, Cammie Mcgee, MD  ?  ?  ? ?  ?  ?  ?? diclofenac (VOLTAREN) 75 MG EC tablet [Pharmacy Med Name: Diclofenac Sodium 75 MG Oral Tablet Delayed Release] 60 tablet 0  ?  Sig: Take 1 tablet by mouth twice daily  ?  ? Analgesics:  NSAIDS Failed - 06/20/2021  9:54 AM  ?  ?  Failed - Manual Review: Labs are only required if the patient has taken medication for more than 8 weeks.  ?  ?  Failed - Cr in normal range and within 360 days  ?  Creat  ?Date Value Ref Range Status  ?05/14/2021 1.07 (H) 0.60 - 0.95 mg/dL Final  ?   ?  ?  Passed - HGB in normal range and within 360 days  ?  Hemoglobin  ?Date Value Ref Range Status  ?05/14/2021 14.1 11.7 - 15.5 g/dL Final  ?02/25/2019 14.1 11.1 - 15.9 g/dL Final  ?   ?  ?  Passed - PLT in normal range and within 360 days  ?  Platelets  ?Date Value Ref Range Status  ?05/14/2021 215 140 - 400 Thousand/uL Final  ?02/25/2019 253 150 - 450 x10E3/uL Final  ?   ?  ?  Passed - HCT in normal range and within 360 days  ?  HCT  ?Date  Value Ref Range Status  ?05/14/2021 43.5 35.0 - 45.0 % Final  ? ?Hematocrit  ?Date Value Ref Range Status  ?02/25/2019 43.1 34.0 - 46.6 % Final  ?   ?  ?  Passed - eGFR is 30 or above and within 360 days  ?  GFR, Est African American  ?Date Value Ref Range Status  ?04/19/2020 54 (L) > OR = 60 mL/min/1.62m Final  ? ?GFR, Est Non African American  ?Date Value Ref Range Status  ?04/19/2020 47 (L) > OR = 60 mL/min/1.778mFinal  ? ?eGFR  ?Date Value Ref Range Status  ?05/14/2021 50 (L) > OR = 60 mL/min/1.7314minal  ?  Comment:  ?  The eGFR is based on the CKD-EPI 2021 equation. To calculate  ?the new eGFR from a previous Creatinine or Cystatin C ?result, go to https://www.kidney.org/professionals/ ?kdoqi/gfr%5Fcalculator ?  ?   ?  ?  Passed - Patient is not pregnant  ?  ?  Passed - Valid encounter within last 12 months  ?  Recent Outpatient Visits   ?      ? 1 month ago PM[place] ([place]ymyalgia rhe[place]a) ([place]? [facility] WarCammie McgeeD  ? 6 months ago PMR (polymyalgia rheumatica) ([place]? [facility] WarCammie McgeeD  ? 1 year ago PMR (polymyalgia rheumatica) ([place]? [facility] WarCammie Mcge[facility]D  ? 1 year ago Stage 3b chronic kidney [facility] ([facility] WarCammie McgeeD  ? 1 year ago Bilateral leg weakness  ? [facility], WarCammie McgeeD  ?  ?  ? ?  ?  ?  ? ?

## 2021-06-20 NOTE — Telephone Encounter (Signed)
Requested medication (s) are due for refill today - no(change in pharmacy) ? ?Requested medication (s) are on the active medication list -yes ? ?Future visit scheduled -no ? ?Last refill: 12/14/20 #30 11RF ? ?Notes to clinic: Call to patient- patient has changed pharmacy and is requesting all medications go to [facility]- requesting remainder of this Rx be forwarded- non delegated Rx ? ?Requested Prescriptions  ?Pending Prescriptions Disp Refills  ? predniSONE (DELTASONE) 10 MG tablet [Pharmacy Med Name: predniSONE 10 MG Oral Tablet] 60 tablet 0  ?  Sig: TAKE 2 TABLETS BY MOUTH ONCE DAILY WITH BREAKFAST  ?  ? Not Delegated - Endocrinology:  Oral Corticosteroids Failed - 06/20/2021  9:54 AM  ?  ?  Failed - This refill cannot be delegated  ?  ?  Failed - Manual Review: Eye exam for IOP if prolonged treatment  ?  ?  Failed - Bone Mineral Density or Dexa Scan completed in the last 2 years  ?  ?  Passed - Glucose (serum) in normal range and within 180 days  ?  Glucose, Bld  ?Date Value Ref Range Status  ?05/14/2021 98 65 - 99 mg/dL Final  ?  Comment:  ?  . ?           Fasting reference interval ?. ?  ?   ?  ?  Passed - K in normal range and within 180 days  ?  Potassium  ?Date Value Ref Range Status  ?05/14/2021 4.2 3.5 - 5.3 mmol/L Final  ?   ?  ?  Passed - Na in normal range and within 180 days  ?  Sodium  ?Date Value Ref Range Status  ?05/14/2021 141 135 - 146 mmol/L Final  ?   ?  ?  Passed - Last BP in normal range  ?  BP Readings from Last 1 Encounters:  ?05/14/21 128/68  ?   ?  ?  Passed - Valid encounter within last 6 months  ?  Recent Outpatient Visits   ? ?      ? 1 month ago PMR (polymyalgia rheumatica) ([place])  ? [facility] Pickard, Cammie Mcgee, MD  ? 6 months ago PMR (polymyalgia rheumatica) ([place])  ? [facility] Pickard, Cammie Mcgee, MD  ? 1 year ago PMR (polymyalgia rheumatica) ([place])  ? [facility] Pickard, Cammie Mcgee, MD  ? 1 year ago Stage 3b chronic kidney disease  [facility])  ? [facility] Pickard, Cammie Mcgee, MD  ? 1 year ago Bilateral leg weakness  ? [facility], Cammie Mcgee, MD  ? ?  ?  ? ? ?  ?  ?  ?Signed Prescriptions Disp Refills  ? amLODipine (NORVASC) 5 MG tablet 90 tablet 1  ?  Sig: Take 1 tablet by mouth once daily  ?  ? Cardiovascular: Calcium Channel Blockers 2 Passed - 06/20/2021  9:54 AM  ?  ?  Passed - Last BP in normal range  ?  BP Readings from Last 1 Encounters:  ?05/14/21 128/68  ?   ?  ?  Passed - Last Heart Rate in normal range  ?  Pulse Readings from Last 1 Encounters:  ?05/14/21 84  ?   ?  ?  Passed - Valid encounter within last 6 months  ?  Recent Outpatient Visits   ? ?      ? 1 month ago PMR (polymyalgia rheumatica) ([place])  ? [facility] Medicine Susy Frizzle, MD  ? 6 months ago PMR (polymyalgia rheumatica) ([place])  ? [facility] Pickard, Cammie Mcgee, MD  ? 1 year ago PMR (polymyalgia rheumatica) ([place])  ? [facility] Pickard, Cammie Mcgee, MD  ? 1 year ago Stage 3b chronic kidney disease [facility])  ? [facility] Pickard, Cammie Mcgee, MD  ? 1 year ago Bilateral leg weakness  ? [facility] Pick[facility], Cammie Mcgee, MD  ? ?  ?  ? ? ?  ?  ?  ? diclofenac (VOLTAREN) 75 MG EC tablet 60 tablet 0  ?  Sig: Take 1 tablet by mouth twice daily  ?  ? Analgesics:  NSAIDS Failed - 06/20/2021  9:54 AM  ?  ?  Failed - Manual Review: Labs are only required if the patient has taken medication for more than 8 weeks.  ?  ?  Failed - Cr in normal range and within 360 days  ?  Creat  ?Date Value Ref Range Status  ?05/14/2021 1.07 (H) 0.60 - 0.95 mg/dL Final  ?   ?  ?  Passed - HGB in normal range and within 360 days  ?  Hemoglobin  ?Date Value Ref Range Status  ?05/14/2021 14.1 11.7 - 15.5 g/dL Final  ?02/25/2019 14.1 11.1 - 15.9 g/dL Final  ?   ?  ?  Passed - PLT in normal range and within 360 days  ?  Platelets  ?Date Value Ref Range Status  ?05/14/2021 215 140 - 400 Thousand/uL Final   ?02/25/2019 253 150 - 450 x10E3/uL Final  ?   ?  ?  Passed - HCT in normal range and within 360 days  ?  HCT  ?Date Value Ref Range Status  ?05/14/2021 43.5 35.0 - 45.0 % Final  ? ?Hematocrit  ?Date Value Ref Range Status  ?02/25/2019 43.1 34.0 - 46.6 % Final  ?   ?  ?  Passed - eGFR is 30 or above and within 360 days  ?  GFR, Est African American  ?Date Value Ref Range Status  ?04/19/2020 54 (L) > OR = 60 mL/min/1.69m Final  ? ?GFR, Est Non African American  ?Date Value Ref Range Status  ?04/19/2020 47 (L) > OR = 60 mL/min/1.782mFinal  ? ?eGFR  ?Date Value Ref Range Status  ?05/14/2021 50 (L) > OR = 60 mL/min/1.7323minal  ?  Comment:  ?  The eGFR is based on the CKD-EPI 2021 equation. To calculate  ?the new eGFR from a previous Creatinine or Cystatin C ?result, go to https://www.kidney.org/professionals/ ?kdoqi/gfr%5Fcalculator ?  ?   ?  ?  Passed - Patient is not pregnant  ?  ?  Passed - Valid encounter within last 12 months  ?  Recent Outpatient Visits   ? ?      ? 1 month ago PMR (polymyalgia rheumatica) ([place]? [facility] WarCammie McgeeD  ? 6 months ago PMR (polymyalgia rheumatica) ([place]? [facility] WarCammie McgeeD  ? 1 year ago PMR (polymyalgia rheumatica) ([place]? [facility] WarCammie McgeeD  ? 1 year ago Stage [facility]b chronic kidney [facility] ([facility]? [facility], WarCammie McgeeD  ? 1 year ago Bilateral leg weakness  ? [facility], WarCammie McgeeD  ? ?  ?  ? ? ?  ?  ?  ? ? ? ?  Requested Prescriptions  ?Pending Prescriptions Disp Refills  ? predniSONE (DELTASONE) 10 MG tablet [Pharmacy Med Name: predniSONE 10 MG Oral Tablet] 60 tablet 0  ?  Sig: TAKE 2 TABLETS BY MOUTH ONCE DAILY WITH BREAKFAST  ?  ? Not Delegated - Endocrinology:  Oral Corticosteroids Failed - 06/20/2021  9:54 AM  ?  ?  Failed - This refill cannot be delegated  ?  ?  Failed - Manual Review: Eye exam for IOP if prolonged treatment   ?  ?  Failed - Bone Mineral Density or Dexa Scan completed in the last 2 years  ?  ?  Passed - Glucose (serum) in normal range and within 180 days  ?  Glucose, Bld  ?Date Value Ref Range Status  ?05/14/2021 98 65 - 99 mg/dL Final  ?  Comment:  ?  . ?           Fasting reference interval ?. ?  ?   ?  ?  Passed - K in normal range and within 180 days  ?  Potassium  ?Date Value Ref Range Status  ?05/14/2021 4.2 3.5 - 5.3 mmol/L Final  ?   ?  ?  Passed - Na in normal range and within 180 days  ?  Sodium  ?Date Value Ref Range Status  ?05/14/2021 141 135 - 146 mmol/L Final  ?   ?  ?  Passed - Last BP in normal range  ?  BP Readings from Last 1 Encounters:  ?05/14/21 128/68  ?   ?  ?  Passed - Valid encounter within last 6 months  ?  Recent Outpatient Visits   ? ?      ? 1 month ago PMR (polymyalgia rheumatica) ([place])  ? [facility] Pickard, Cammie Mcgee, MD  ? 6 months ago PMR (polymyalgia rheumatica) ([place])  ? [facility] Pickard, Cammie Mcgee, MD  ? 1 year ago PMR (polymyalgia rheumatica) ([place])  ? [facility] Pickard, Cammie Mcgee, MD  ? 1 year ago Stage 3b chronic kidney disease [facility])  ? [facility] Pickard, Cammie Mcgee, MD  ? 1 year ago Bilateral leg weakness  ? [facility] Pickard, Cammie Mcgee, MD  ? ?  ?  ? ? ?  ?  ?  ?Signed Prescriptions Disp Refills  ? amLODipine (NORVASC) 5 MG tablet 90 tablet 1  ?  Sig: Take 1 tablet by mouth once daily  ?  ? Cardiovascular: Calcium Channel Blockers 2 Passed - 06/20/2021  9:54 AM  ?  ?  Passed - Last BP in normal range  ?  BP Readings from Last 1 Encounters:  ?05/14/21 128/68  ?   ?  ?  Passed - Last Heart Rate in normal range  ?  Pulse Readings from Last 1 Encounters:  ?05/14/21 84  ?   ?  ?  Passed - Valid encounter within last 6 months  ?  Recent Outpatient Visits   ? ?      ? 1 month ago PMR (polymyalgia rheumatica) ([place])  ? [facility] Pickard, Cammie Mcgee, MD  ? 6 months ago PMR (polymyalgia  rheumatica) ([place])  ? [facility] Pickard, Cammie Mcgee, MD  ? 1 year ago PMR (polymyalgia rheumatica) ([place])  ? [facility] Pickard, Cammie Mcgee, MD  ? 1 year ago Stage 3b chron

## 2021-08-14 ENCOUNTER — Telehealth: Payer: Self-pay

## 2021-08-14 NOTE — Telephone Encounter (Signed)
Tried to call patient back to schedule for shoulder pain, but line is busy.

## 2021-08-16 ENCOUNTER — Ambulatory Visit: Payer: Self-pay

## 2021-08-16 ENCOUNTER — Ambulatory Visit (INDEPENDENT_AMBULATORY_CARE_PROVIDER_SITE_OTHER): Payer: Medicare Other | Admitting: Surgery

## 2021-08-16 DIAGNOSIS — M19112 Post-traumatic osteoarthritis, left shoulder: Secondary | ICD-10-CM

## 2021-08-16 DIAGNOSIS — M25512 Pain in left shoulder: Secondary | ICD-10-CM | POA: Diagnosis not present

## 2021-08-16 MED ORDER — METHYLPREDNISOLONE ACETATE 40 MG/ML IJ SUSP
40.0000 mg | INTRAMUSCULAR | Status: AC | PRN
  Administered 2021-08-16: 40 mg via INTRA_ARTICULAR

## 2021-08-16 MED ORDER — LIDOCAINE HCL 1 % IJ SOLN
3.0000 mL | INTRAMUSCULAR | Status: AC | PRN
  Administered 2021-08-16: 3 mL

## 2021-08-16 MED ORDER — BUPIVACAINE HCL 0.25 % IJ SOLN
5.0000 mL | INTRAMUSCULAR | Status: AC | PRN
  Administered 2021-08-16: 5 mL via INTRA_ARTICULAR

## 2021-08-16 NOTE — Progress Notes (Signed)
Office Visit Note   Patient: [name]           Date of Birth: 1931/08/03           MRN: [id number] Visit Date: 08/16/2021              Requested by: Donita Brooks, MD [address] [place],  [place] [address] PCP: Donita Brooks, MD   Assessment & Plan: Visit Diagnoses:  1. Acute pain of left shoulder   2. Post-traumatic osteoarthritis of left shoulder     Plan: In hopes of giving patient relief of her shoulder pain I offered conservative treatment with injection.  After patient consent left shoulder was prepped with Betadine and a subacromial Marcaine/Depo-Medrol injection was performed.  After sitting for a few minutes patient reported good relief of her pain with anesthetic in place.  Since patient aggravated her shoulder by using the heavier walker advise that she use a different one she is using today.  Follow-up as needed.  Follow-Up Instructions: Return if symptoms worsen or fail to improve.   Orders:  Orders Placed This Encounter  Procedures   Large Joint Inj: L subacromial bursa   XR Shoulder Left   No orders of the defined types were placed in this encounter.     Procedures: Large Joint Inj: L subacromial bursa on 08/16/2021 4:13 PM Indications: pain Details: 25 G 1.5 in needle, posterior approach Medications: 3 mL lidocaine 1 %; 5 mL bupivacaine 0.25 %; 40 mg methylPREDNISolone acetate 40 MG/ML Outcome: tolerated well, no immediate complications Consent was given by the patient.       Clinical Data: No additional findings.   Subjective: Chief Complaint  Patient presents with   Left Shoulder - Pain    HPI 88 year old white female comes in today with complaints of left shoulder pain.  States that pain ongoing for couple weeks.  Thinks that she may have aggravated this when she was using heavier walker than what she normally uses.  She normally has shoulder stiffness but pain has been more bothersome with attempting to reach  overhead.  Says that she suffered a left proximal shoulder fracture that was conservatively managed by a physician [facility] several years ago. Review of Systems No current cardiac pulmonary GI GU issues  Objective: Vital Signs: There were no vitals taken for this visit.  Physical Exam HENT:     Head: Atraumatic.  Eyes:     Extraocular Movements: Extraocular movements intact.  Pulmonary:     Effort: No respiratory distress.  Musculoskeletal:     Comments: Left shoulder active flexion/abduction about 70 to 80 degrees with pain.  Shoulder is diffusely tender.  Neurological:     Mental Status: She is alert.  Psychiatric:        Mood and Affect: Mood normal.     Ortho Exam  Specialty Comments:  No specialty comments available.  Imaging: No results found.   PMFS History: Patient Active Problem List   Diagnosis Date Noted   Left foot drop 02/05/2021   Pain of left lower leg 01/17/2021   Strain of calf muscle 01/17/2021   Bilateral wrist pain 05/11/2020   Body mass index (BMI) 27.0-27.9, adult 05/04/2019   Thoracic spondylosis with myelopathy 05/04/2019   Proximal weakness of extremity 02/25/2019   CKD (chronic kidney disease) stage 3, GFR 30-59 ml/min (HCC)    Unilateral primary osteoarthritis, left knee 09/16/2018   Thoracic compression fracture, sequela 09/16/2018  Hx of total hip arthroplasty, bilateral 09/16/2018   Bilateral primary osteoarthritis of knee 08/04/2018   Essential hypertension 07/08/2018   Osteoarthritis of right knee 05/25/2018   Knee pain, right 12/08/2017   Osteopenia 09/13/2014   Fatty liver disease, nonalcoholic    Past Medical History:  Diagnosis Date   Cataract    CKD (chronic kidney disease) stage 3, GFR 30-59 ml/min (HCC)    Fatty liver disease, nonalcoholic    2007- admitted with encephalopathy unsure of cause, resolved sponatneously   Hypertension     Family History  Problem Relation Age of Onset   Heart disease  Mother    Cancer Father        metastatic unsure of primary   Early death Brother        died at 71 y/o   Liver cancer Maternal Grandmother    Healthy Son     Past Surgical History:  Procedure Laterality Date   FRACTURE SURGERY     right shoulder, both wrists    JOINT REPLACEMENT     HIP- bilateral   Social History   Occupational History   Not on file  Tobacco Use   Smoking status: Never   Smokeless tobacco: Never  Vaping Use   Vaping Use: Never used  Substance and Sexual Activity   Alcohol use: Not Currently   Drug use: Never   Sexual activity: Not Currently    Comment: raised tobacco, retired

## 2021-09-19 ENCOUNTER — Other Ambulatory Visit: Payer: Self-pay | Admitting: Family Medicine

## 2021-09-19 NOTE — Telephone Encounter (Signed)
Requested Prescriptions  Pending Prescriptions Disp Refills  . escitalopram (LEXAPRO) 10 MG tablet [Pharmacy Med Name: Escitalopram Oxalate 10 MG Oral Tablet] 90 tablet 0    Sig: Take 1 tablet by mouth once daily     Psychiatry:  Antidepressants - SSRI Passed - 09/19/2021 10:49 AM      Passed - Valid encounter within last 6 months    Recent Outpatient Visits          4 months ago PMR (polymyalgia rheumatica) (HCC)   [facility] Pickard, Priscille Heidelberg, MD   9 months ago PMR (polymyalgia rheumatica) (HCC)   [facility] Pickard, Priscille Heidelberg, MD   1 year ago PMR (polymyalgia rheumatica) (HCC)   [facility] Donita Brooks, MD   1 year ago Stage 3b chronic kidney disease (HCC)   [facility] Donita Brooks, MD   2 years ago Bilateral leg weakness   [facility] Pickard, Priscille Heidelberg, MD

## 2021-10-01 ENCOUNTER — Ambulatory Visit (INDEPENDENT_AMBULATORY_CARE_PROVIDER_SITE_OTHER): Payer: Medicare Other | Admitting: Family Medicine

## 2021-10-01 VITALS — BP 120/60 | HR 74 | Temp 98.0°F | Ht 63.0 in | Wt 150.2 lb

## 2021-10-01 DIAGNOSIS — S80821A Blister (nonthermal), right lower leg, initial encounter: Secondary | ICD-10-CM | POA: Diagnosis not present

## 2021-10-01 NOTE — Progress Notes (Signed)
Subjective:    Patient ID: [name], female    DOB: [birth year], 88 y.o.   MRN: [id number]  HPI Patient has a large blood blister on her posterior lower left calf.  It is approximately 5 cm in diameter.  It is filled with bloody red serous fluid and a visible clot.  It has not ruptured and has been there for over a month. Past Medical History:  Diagnosis Date   Cataract    CKD (chronic kidney disease) stage 3, GFR 30-59 ml/min (HCC)    Fatty liver disease, nonalcoholic    2007- admitted with encephalopathy unsure of cause, resolved sponatneously   Hypertension    Past Surgical History:  Procedure Laterality Date   FRACTURE SURGERY     right shoulder, both wrists    JOINT REPLACEMENT     HIP- bilateral   Current Outpatient Medications on File Prior to Visit  Medication Sig Dispense Refill   ALPRAZolam (XANAX) 0.5 MG tablet Take 1 tablet (0.5 mg total) by mouth 3 (three) times daily as needed for anxiety. 90 tablet 0   amLODipine (NORVASC) 5 MG tablet Take 1 tablet by mouth once daily 90 tablet 1   denosumab (PROLIA) 60 MG/ML SOSY injection Inject 60 mg into the skin every 6 (six) months.     diclofenac (VOLTAREN) 75 MG EC tablet Take 1 tablet by mouth twice daily 60 tablet 0   escitalopram (LEXAPRO) 10 MG tablet Take 1 tablet by mouth once daily 90 tablet 0   predniSONE (DELTASONE) 10 MG tablet Take 1 tablet (10 mg total) by mouth daily with breakfast. 90 tablet 1   traMADol (ULTRAM) 50 MG tablet Take 1 tablet (50 mg total) by mouth every 6 (six) hours as needed. 120 tablet 2   Camphor-Menthol-Methyl Sal (SALONPAS EX) Apply topically. (Patient not taking: Reported on 10/01/2021)     diclofenac Sodium (VOLTAREN) 1 % GEL Apply topically 4 (four) times daily. (Patient not taking: Reported on 05/14/2021)     gabapentin (NEURONTIN) 100 MG capsule Take 1 capsule by mouth 3 times a day as needed for nerve pain (Patient not taking: Reported on 05/14/2021) 90 capsule 11   Current  Facility-Administered Medications on File Prior to Visit  Medication Dose Route Frequency Provider Last Rate Last Admin   denosumab (PROLIA) injection 60 mg  60 mg Subcutaneous Q6 months Travor Royce T, MD   60 mg at 07/26/20 1011   Allergies  Allergen Reactions   Pneumococcal Vaccines Other (See Comments)    Pt had localized reaction to injection of Pneumococcal 23 - Swelling and redness in upper arm that extend to but not beyond elbow   Social History   Socioeconomic History   Marital status: Widowed    Spouse name: Not on file   Number of children: 1   Years of education: Not on file   Highest education level: Not on file  Occupational History   Not on file  Tobacco Use   Smoking status: Never   Smokeless tobacco: Never  Vaping Use   Vaping Use: Never used  Substance and Sexual Activity   Alcohol use: Not Currently   Drug use: Never   Sexual activity: Not Currently    Comment: raised tobacco, retired  Other Topics Concern   Not on file  Social History Narrative   Lives alone   Right handed    Caffeine: 1/2-1 cup coffee in AM   Social Determinants of Health   Financial Resource Strain:  Low Risk  (03/16/2021)   Overall Financial Resource Strain (CARDIA)    Difficulty of Paying Living Expenses: Not very hard  Food Insecurity: No Food Insecurity (03/16/2021)   Hunger Vital Sign    Worried About Running Out of Food in the Last Year: Never true    Ran Out of Food in the Last Year: Never true  Transportation Needs: No Transportation Needs (03/16/2021)   PRAPARE - Administrator, Civil Service (Medical): No    Lack of Transportation (Non-Medical): No  Physical Activity: Inactive (03/16/2021)   Exercise Vital Sign    Days of Exercise per Week: 0 days    Minutes of Exercise per Session: 0 min  Stress: No Stress Concern Present (03/16/2021)   [employer] of Occupational Health - Occupational Stress Questionnaire    Feeling of Stress : Not at all  Social  Connections: Moderately Integrated (03/16/2021)   Social Connection and Isolation Panel [NHANES]    Frequency of Communication with Friends and Family: More than three times a week    Frequency of Social Gatherings with Friends and Family: More than three times a week    Attends Religious Services: More than 4 times per year    Active Member of [employer] or Organizations: Yes    Attends Banker Meetings: More than 4 times per year    Marital Status: Widowed  Intimate Partner Violence: Not At Risk (03/16/2021)   Humiliation, Afraid, Rape, and Kick questionnaire    Fear of Current or Ex-Partner: No    Emotionally Abused: No    Physically Abused: No    Sexually Abused: No     Review of Systems  All other systems reviewed and are negative.      Objective:   Physical Exam Constitutional:      Appearance: She is well-developed.  Cardiovascular:     Rate and Rhythm: Normal rate and regular rhythm.     Heart sounds: Normal heart sounds.  Pulmonary:     Effort: Pulmonary effort is normal. No respiratory distress.     Breath sounds: Normal breath sounds. No stridor. No wheezing or rales.  Abdominal:    Musculoskeletal:       Arms:     Right knee: No MCL or LCL tenderness. No MCL laxity.       Legs:           Assessment & Plan:  Blister of right lower leg, initial encounter I aspirated 15 cc of bloody serous fluid from the blister.  I then applied Silvadene to nonadherent gauze and wrapped in Coban.  Anticipate healing now that the blister has ruptured

## 2021-10-03 ENCOUNTER — Telehealth: Payer: Self-pay | Admitting: Family Medicine

## 2021-10-03 NOTE — Telephone Encounter (Signed)
Patient left voicemail message to report new injury to front of same leg; scraped it getting into a car. Requesting call back to see if one of the nurses can put the same patch on it used for the bruised blister on the back of her leg.   Please advise at [phone].

## 2021-10-09 ENCOUNTER — Ambulatory Visit (INDEPENDENT_AMBULATORY_CARE_PROVIDER_SITE_OTHER): Payer: Medicare Other | Admitting: Family Medicine

## 2021-10-09 VITALS — BP 130/80 | HR 73 | Temp 98.4°F | Ht 63.0 in | Wt 150.0 lb

## 2021-10-09 DIAGNOSIS — L03115 Cellulitis of right lower limb: Secondary | ICD-10-CM

## 2021-10-09 MED ORDER — CEPHALEXIN 500 MG PO CAPS: 500.0000 mg | ORAL_CAPSULE | Freq: Three times a day (TID) | ORAL | 0 refills | Status: DC

## 2021-10-09 MED ORDER — CEFTRIAXONE SODIUM 1 G IJ SOLR
1.0000 g | Freq: Once | INTRAMUSCULAR | Status: AC
  Administered 2021-10-09: 1 g via INTRAMUSCULAR

## 2021-10-09 NOTE — Addendum Note (Signed)
Addended by: Venia Carbon K on: 10/09/2021 12:45 PM   Modules accepted: Orders

## 2021-10-09 NOTE — Progress Notes (Signed)
Subjective:    Patient ID: [name], female    DOB: 10-21-1931, 88 y.o.   MRN: [id number]  HPI  Patient suffered a skin tear on her lower right leg Friday of last week.  This morning she presents with erythema extending up the left leg as shown above in the photograph.  The skin is red hot warm to the touch and painful.  She is on prednisone for PMR.  She is currently taking 5 mg a day. Past Medical History:  Diagnosis Date  . Cataract   . CKD (chronic kidney disease) stage 3, GFR 30-59 ml/min (HCC)   . Fatty liver disease, nonalcoholic    2007- admitted with encephalopathy unsure of cause, resolved sponatneously  . Hypertension    Past Surgical History:  Procedure Laterality Date  . FRACTURE SURGERY     right shoulder, both wrists   . JOINT REPLACEMENT     HIP- bilateral   Current Outpatient Medications on File Prior to Visit  Medication Sig Dispense Refill  . ALPRAZolam (XANAX) 0.5 MG tablet Take 1 tablet (0.5 mg total) by mouth 3 (three) times daily as needed for anxiety. 90 tablet 0  . amLODipine (NORVASC) 5 MG tablet Take 1 tablet by mouth once daily 90 tablet 1  . denosumab (PROLIA) 60 MG/ML SOSY injection Inject 60 mg into the skin every 6 (six) months.    . diclofenac (VOLTAREN) 75 MG EC tablet Take 1 tablet by mouth twice daily 60 tablet 0  . escitalopram (LEXAPRO) 10 MG tablet Take 1 tablet by mouth once daily 90 tablet 0  . predniSONE (DELTASONE) 10 MG tablet Take 1 tablet (10 mg total) by mouth daily with breakfast. 90 tablet 1  . traMADol (ULTRAM) 50 MG tablet Take 1 tablet (50 mg total) by mouth every 6 (six) hours as needed. 120 tablet 2  . Camphor-Menthol-Methyl Sal (SALONPAS EX) Apply topically. (Patient not taking: Reported on 10/01/2021)    . diclofenac Sodium (VOLTAREN) 1 % GEL Apply topically 4 (four) times daily. (Patient not taking: Reported on 05/14/2021)    . gabapentin (NEURONTIN) 100 MG capsule Take 1 capsule by mouth 3 times a day as needed for  nerve pain (Patient not taking: Reported on 05/14/2021) 90 capsule 11   Current Facility-Administered Medications on File Prior to Visit  Medication Dose Route Frequency Provider Last Rate Last Admin  . denosumab (PROLIA) injection 60 mg  60 mg Subcutaneous Q6 months Donita Brooks, MD   60 mg at 07/26/20 1011   Allergies  Allergen Reactions  . Pneumococcal Vaccines Other (See Comments)    Pt had localized reaction to injection of Pneumococcal 23 - Swelling and redness in upper arm that extend to but not beyond elbow   Social History   Socioeconomic History  . Marital status: Widowed    Spouse name: Not on file  . Number of children: 1  . Years of education: Not on file  . Highest education level: Not on file  Occupational History  . Not on file  Tobacco Use  . Smoking status: Never  . Smokeless tobacco: Never  Vaping Use  . Vaping Use: Never used  Substance and Sexual Activity  . Alcohol use: Not Currently  . Drug use: Never  . Sexual activity: Not Currently    Comment: raised tobacco, retired  Other Topics Concern  . Not on file  Social History Narrative   Lives alone   Right handed    Caffeine: 1/2-1  cup coffee in AM   Social Determinants of Health   Financial Resource Strain: Low Risk  (03/16/2021)   Overall Financial Resource Strain (CARDIA)   . Difficulty of Paying Living Expenses: Not very hard  Food Insecurity: No Food Insecurity (03/16/2021)   Hunger Vital Sign   . Worried About Programme researcher, broadcasting/film/video in the Last Year: Never true   . Ran Out of Food in the Last Year: Never true  Transportation Needs: No Transportation Needs (03/16/2021)   PRAPARE - Transportation   . Lack of Transportation (Medical): No   . Lack of Transportation (Non-Medical): No  Physical Activity: Inactive (03/16/2021)   Exercise Vital Sign   . Days of Exercise per Week: 0 days   . Minutes of Exercise per Session: 0 min  Stress: No Stress Concern Present (03/16/2021)   [employer]  of Occupational Health - Occupational Stress Questionnaire   . Feeling of Stress : Not at all  Social Connections: Moderately Integrated (03/16/2021)   Social Connection and Isolation Panel [NHANES]   . Frequency of Communication with Friends and Family: More than three times a week   . Frequency of Social Gatherings with Friends and Family: More than three times a week   . Attends Religious Services: More than 4 times per year   . Active Member of Clubs or Organizations: Yes   . Attends Banker Meetings: More than 4 times per year   . Marital Status: Widowed  Intimate Partner Violence: Not At Risk (03/16/2021)   Humiliation, Afraid, Rape, and Kick questionnaire   . Fear of Current or Ex-Partner: No   . Emotionally Abused: No   . Physically Abused: No   . Sexually Abused: No     Review of Systems  All other systems reviewed and are negative.      Objective:   Physical Exam Constitutional:      Appearance: She is well-developed.  Cardiovascular:     Rate and Rhythm: Normal rate and regular rhythm.     Heart sounds: Normal heart sounds.  Pulmonary:     Effort: Pulmonary effort is normal. No respiratory distress.     Breath sounds: Normal breath sounds. No stridor. No wheezing or rales.  Musculoskeletal:     Right knee: No MCL or LCL tenderness. No MCL laxity.  Skin:    Findings: Erythema and lesion present.          Assessment & Plan:  Cellulitis of right lower extremity Patient has rapidly advancing cellulitis of the right lower extremity.  Patient received 1 g of Rocephin IM x1 now and I will start her on Keflex 500 mg 3 times daily for 7 days.  I placed Silvadene on nonadherent gauze to cover the skin tear and then wrapped the leg with Coban.  Recheck the patient on Thursday or Friday.  Seek medical attention immediately if worsening.

## 2021-10-12 ENCOUNTER — Ambulatory Visit (INDEPENDENT_AMBULATORY_CARE_PROVIDER_SITE_OTHER): Payer: Medicare Other | Admitting: Family Medicine

## 2021-10-12 VITALS — BP 128/64 | HR 76 | Temp 98.5°F | Ht 63.0 in | Wt 146.0 lb

## 2021-10-12 DIAGNOSIS — L03115 Cellulitis of right lower limb: Secondary | ICD-10-CM

## 2021-10-12 NOTE — Progress Notes (Signed)
Subjective:    Patient ID: [name], female    DOB: 11/14/1931, 88 y.o.   MRN: [id number]  HPI 10/09/21  Patient suffered a skin tear on her lower right leg Friday of last week.  This morning she presents with erythema extending up the left leg as shown above in the photograph.  The skin is red hot warm to the touch and painful.  She is on prednisone for PMR.  She is currently taking 5 mg a day.  At that time, my plan was:  Patient has rapidly advancing cellulitis of the right lower extremity.  Patient received 1 g of Rocephin IM x1 now and I will start her on Keflex 500 mg 3 times daily for 7 days.  I placed Silvadene on nonadherent gauze to cover the skin tear and then wrapped the leg with Coban.  Recheck the patient on Thursday or Friday.  Seek medical attention immediately if worsening.  10/12/21 Patient states that her leg was burning all night long on Monday night.  The Silvadene that I placed on the wound was "burning" causing her significant discomfort.  She states that starting the following day, the burning started to improve.  She still states that the skin tear is very tender and sore.  However the warmth and the redness is starting to fade.  The pain is improving.  There is no extension of the cellulitis.  She denies any fevers or chills.  She is ambulating using a walker Past Medical History:  Diagnosis Date   Cataract    CKD (chronic kidney disease) stage 3, GFR 30-59 ml/min (HCC)    Fatty liver disease, nonalcoholic    2007- admitted with encephalopathy unsure of cause, resolved sponatneously   Hypertension    Past Surgical History:  Procedure Laterality Date   FRACTURE SURGERY     right shoulder, both wrists    JOINT REPLACEMENT     HIP- bilateral   Current Outpatient Medications on File Prior to Visit  Medication Sig Dispense Refill   ALPRAZolam (XANAX) 0.5 MG tablet Take 1 tablet (0.5 mg total) by mouth 3 (three) times daily as needed for anxiety. 90 tablet 0    amLODipine (NORVASC) 5 MG tablet Take 1 tablet by mouth once daily 90 tablet 1   cephALEXin (KEFLEX) 500 MG capsule Take 1 capsule (500 mg total) by mouth 3 (three) times daily. 21 capsule 0   denosumab (PROLIA) 60 MG/ML SOSY injection Inject 60 mg into the skin every 6 (six) months.     diclofenac (VOLTAREN) 75 MG EC tablet Take 1 tablet by mouth twice daily 60 tablet 0   diclofenac Sodium (VOLTAREN) 1 % GEL Apply topically 4 (four) times daily.     escitalopram (LEXAPRO) 10 MG tablet Take 1 tablet by mouth once daily 90 tablet 0   gabapentin (NEURONTIN) 100 MG capsule Take 1 capsule by mouth 3 times a day as needed for nerve pain 90 capsule 11   predniSONE (DELTASONE) 10 MG tablet Take 1 tablet (10 mg total) by mouth daily with breakfast. 90 tablet 1   traMADol (ULTRAM) 50 MG tablet Take 1 tablet (50 mg total) by mouth every 6 (six) hours as needed. 120 tablet 2   Camphor-Menthol-Methyl Sal (SALONPAS EX) Apply topically. (Patient not taking: Reported on 10/12/2021)     Current Facility-Administered Medications on File Prior to Visit  Medication Dose Route Frequency Provider Last Rate Last Admin   denosumab (PROLIA) injection 60 mg  60  mg Subcutaneous Q6 months Donita Brooks, MD   60 mg at 07/26/20 1011   Allergies  Allergen Reactions   Pneumococcal Vaccines Other (See Comments)    Pt had localized reaction to injection of Pneumococcal 23 - Swelling and redness in upper arm that extend to but not beyond elbow   Social History   Socioeconomic History   Marital status: Widowed    Spouse name: Not on file   Number of children: 1   Years of education: Not on file   Highest education level: Not on file  Occupational History   Not on file  Tobacco Use   Smoking status: Never   Smokeless tobacco: Never  Vaping Use   Vaping Use: Never used  Substance and Sexual Activity   Alcohol use: Not Currently   Drug use: Never   Sexual activity: Not Currently    Comment: raised tobacco,  retired  Other Topics Concern   Not on file  Social History Narrative   Lives alone   Right handed    Caffeine: 1/2-1 cup coffee in AM   Social Determinants of Health   Financial Resource Strain: Low Risk  (03/16/2021)   Overall Financial Resource Strain (CARDIA)    Difficulty of Paying Living Expenses: Not very hard  Food Insecurity: No Food Insecurity (03/16/2021)   Hunger Vital Sign    Worried About Running Out of Food in the Last Year: Never true    Ran Out of Food in the Last Year: Never true  Transportation Needs: No Transportation Needs (03/16/2021)   PRAPARE - Administrator, Civil Service (Medical): No    Lack of Transportation (Non-Medical): No  Physical Activity: Inactive (03/16/2021)   Exercise Vital Sign    Days of Exercise per Week: 0 days    Minutes of Exercise per Session: 0 min  Stress: No Stress Concern Present (03/16/2021)   [employer] of Occupational Health - Occupational Stress Questionnaire    Feeling of Stress : Not at all  Social Connections: Moderately Integrated (03/16/2021)   Social Connection and Isolation Panel [NHANES]    Frequency of Communication with Friends and Family: More than three times a week    Frequency of Social Gatherings with Friends and Family: More than three times a week    Attends Religious Services: More than 4 times per year    Active Member of [employer] or Organizations: Yes    Attends Banker Meetings: More than 4 times per year    Marital Status: Widowed  Intimate Partner Violence: Not At Risk (03/16/2021)   Humiliation, Afraid, Rape, and Kick questionnaire    Fear of Current or Ex-Partner: No    Emotionally Abused: No    Physically Abused: No    Sexually Abused: No     Review of Systems  All other systems reviewed and are negative.      Objective:   Physical Exam Constitutional:      Appearance: She is well-developed.  Cardiovascular:     Rate and Rhythm: Normal rate and regular rhythm.      Heart sounds: Normal heart sounds.  Pulmonary:     Effort: Pulmonary effort is normal. No respiratory distress.     Breath sounds: Normal breath sounds. No stridor. No wheezing or rales.  Musculoskeletal:     Right knee: No MCL or LCL tenderness. No MCL laxity.  Skin:    Findings: Erythema and lesion present.  Assessment & Plan:  Cellulitis of right lower extremity The cellulitis is stable or slightly better.  The warmth of the skin has improved.  The pain has improved.  The color is essentially unchanged.  I covered a nonadherent gauze with Neosporin in place that over the wound/skin tear.  I then gently wrapped the gauze using Curlex.  I will recheck the patient next week.  I will give the patient additional days on antibiotics before forming any debridement of the skin tear.  Some of the skin is starting to turn yellow and may require debridement next week.  There is no drainage today.  There is no foul odor.  There is no evidence of any abscess formation

## 2021-10-18 ENCOUNTER — Ambulatory Visit: Payer: Medicare Other | Admitting: Family Medicine

## 2021-10-19 ENCOUNTER — Ambulatory Visit (INDEPENDENT_AMBULATORY_CARE_PROVIDER_SITE_OTHER): Payer: Medicare Other | Admitting: Family Medicine

## 2021-10-19 VITALS — BP 130/70 | HR 72 | Temp 97.7°F | Ht 63.0 in | Wt 145.8 lb

## 2021-10-19 DIAGNOSIS — S81811D Laceration without foreign body, right lower leg, subsequent encounter: Secondary | ICD-10-CM | POA: Diagnosis not present

## 2021-10-19 DIAGNOSIS — L03115 Cellulitis of right lower limb: Secondary | ICD-10-CM | POA: Diagnosis not present

## 2021-10-19 NOTE — Progress Notes (Signed)
Subjective:    Patient ID: [name], female    DOB: Jul 29, 1931, 88 y.o.   MRN: [id number]  HPI 10/09/21  Patient suffered a skin tear on her lower right leg Friday of last week.  This morning she presents with erythema extending up the left leg as shown above in the photograph.  The skin is red hot warm to the touch and painful.  She is on prednisone for PMR.  She is currently taking 5 mg a day.  At that time, my plan was:  Patient has rapidly advancing cellulitis of the right lower extremity.  Patient received 1 g of Rocephin IM x1 now and I will start her on Keflex 500 mg 3 times daily for 7 days.  I placed Silvadene on nonadherent gauze to cover the skin tear and then wrapped the leg with Coban.  Recheck the patient on Thursday or Friday.  Seek medical attention immediately if worsening.  10/12/21 Patient states that her leg was burning all night long on Monday night.  The Silvadene that I placed on the wound was "burning" causing her significant discomfort.  She states that starting the following day, the burning started to improve.  She still states that the skin tear is very tender and sore.  However the warmth and the redness is starting to fade.  The pain is improving.  There is no extension of the cellulitis.  She denies any fevers or chills.  She is ambulating using a walker.  At that time, my plan was:  The cellulitis is stable or slightly better.  The warmth of the skin has improved.  The pain has improved.  The color is essentially unchanged.  I covered a nonadherent gauze with Neosporin in place that over the wound/skin tear.  I then gently wrapped the gauze using Curlex.  I will recheck the patient next week.  I will give the patient additional days on antibiotics before forming any debridement of the skin tear.  Some of the skin is starting to turn yellow and may require debridement next week.  There is no drainage today.  There is no foul odor.  There is no evidence of any  abscess formation  10/19/21  Fortunately, the erythema surrounding the wound has faded.  The patient states the pain surrounding the wound has improved.  However there is yellow slough now covering skin tear.  Patient denies any fever or chills.  There is no foul-smelling drainage. Past Medical History:  Diagnosis Date   Cataract    CKD (chronic kidney disease) stage 3, GFR 30-59 ml/min (HCC)    Fatty liver disease, nonalcoholic    [id number] admitted with encephalopathy unsure of cause, resolved sponatneously   Hypertension    Past Surgical History:  Procedure Laterality Date   FRACTURE SURGERY     right shoulder, both wrists    JOINT REPLACEMENT     HIP- bilateral   Current Outpatient Medications on File Prior to Visit  Medication Sig Dispense Refill   ALPRAZolam (XANAX) 0.5 MG tablet Take 1 tablet (0.5 mg total) by mouth 3 (three) times daily as needed for anxiety. 90 tablet 0   amLODipine (NORVASC) 5 MG tablet Take 1 tablet by mouth once daily 90 tablet 1   Camphor-Menthol-Methyl Sal (SALONPAS EX) Apply topically. (Patient not taking: Reported on 10/12/2021)     cephALEXin (KEFLEX) 500 MG capsule Take 1 capsule (500 mg total) by mouth 3 (three) times daily. 21 capsule 0   denosumab (  PROLIA) 60 MG/ML SOSY injection Inject 60 mg into the skin every 6 (six) months.     diclofenac (VOLTAREN) 75 MG EC tablet Take 1 tablet by mouth twice daily 60 tablet 0   diclofenac Sodium (VOLTAREN) 1 % GEL Apply topically 4 (four) times daily.     escitalopram (LEXAPRO) 10 MG tablet Take 1 tablet by mouth once daily 90 tablet 0   gabapentin (NEURONTIN) 100 MG capsule Take 1 capsule by mouth 3 times a day as needed for nerve pain 90 capsule 11   predniSONE (DELTASONE) 10 MG tablet Take 1 tablet (10 mg total) by mouth daily with breakfast. 90 tablet 1   traMADol (ULTRAM) 50 MG tablet Take 1 tablet (50 mg total) by mouth every 6 (six) hours as needed. 120 tablet 2   Current Facility-Administered Medications  on File Prior to Visit  Medication Dose Route Frequency Provider Last Rate Last Admin   denosumab (PROLIA) injection 60 mg  60 mg Subcutaneous Q6 months Ruth Tully T, MD   60 mg at 07/26/20 1011   Allergies  Allergen Reactions   Pneumococcal Vaccines Other (See Comments)    Pt had localized reaction to injection of Pneumococcal 23 - Swelling and redness in upper arm that extend to but not beyond elbow   Social History   Socioeconomic History   Marital status: Widowed    Spouse name: Not on file   Number of children: 1   Years of education: Not on file   Highest education level: Not on file  Occupational History   Not on file  Tobacco Use   Smoking status: Never   Smokeless tobacco: Never  Vaping Use   Vaping Use: Never used  Substance and Sexual Activity   Alcohol use: Not Currently   Drug use: Never   Sexual activity: Not Currently    Comment: raised tobacco, retired  Other Topics Concern   Not on file  Social History Narrative   Lives alone   Right handed    Caffeine: 1/2-1 cup coffee in AM   Social Determinants of Health   Financial Resource Strain: Low Risk  (03/16/2021)   Overall Financial Resource Strain (CARDIA)    Difficulty of Paying Living Expenses: Not very hard  Food Insecurity: No Food Insecurity (03/16/2021)   Hunger Vital Sign    Worried About Running Out of Food in the Last Year: Never true    Ran Out of Food in the Last Year: Never true  Transportation Needs: No Transportation Needs (03/16/2021)   PRAPARE - Administrator, Civil Service (Medical): No    Lack of Transportation (Non-Medical): No  Physical Activity: Inactive (03/16/2021)   Exercise Vital Sign    Days of Exercise per Week: 0 days    Minutes of Exercise per Session: 0 min  Stress: No Stress Concern Present (03/16/2021)   [employer] of Occupational Health - Occupational Stress Questionnaire    Feeling of Stress : Not at all  Social Connections: Moderately Integrated  (03/16/2021)   Social Connection and Isolation Panel [NHANES]    Frequency of Communication with Friends and Family: More than three times a week    Frequency of Social Gatherings with Friends and Family: More than three times a week    Attends Religious Services: More than 4 times per year    Active Member of [employer] or Organizations: Yes    Attends Banker Meetings: More than 4 times per year    Marital  Status: Widowed  Intimate Partner Violence: Not At Risk (03/16/2021)   Humiliation, Afraid, Rape, and Kick questionnaire    Fear of Current or Ex-Partner: No    Emotionally Abused: No    Physically Abused: No    Sexually Abused: No     Review of Systems  All other systems reviewed and are negative.      Objective:   Physical Exam Constitutional:      Appearance: She is well-developed.  Cardiovascular:     Rate and Rhythm: Normal rate and regular rhythm.     Heart sounds: Normal heart sounds.  Pulmonary:     Effort: Pulmonary effort is normal. No respiratory distress.     Breath sounds: Normal breath sounds. No stridor. No wheezing or rales.  Musculoskeletal:     Right knee: Laceration present. No MCL or LCL tenderness.       Legs:  Skin:    Findings: Lesion present. No erythema.           Assessment & Plan:  Cellulitis of right lower extremity  Noninfected skin tear of right lower extremity, subsequent encounter Fortunately, the erythema and signs of cellulitis have faded.  The pain has improved.  Therefore I feel that the secondary cellulitis has resolved.  The patient does have a large skin tear on her right lower shin.  There is significant yellow slough covering the wound.  I believe that the wound will heal quicker by keeping it covered, fixed.  Therefore I recommended applying Tegaderm to the wound every 2 to 3 days until healed.  Anticipate 2 weeks for healing.  I placed a Tegaderm over the wound today.  I also wrapped the wound with Coban.  I  recommended the patient replace the Tegaderm every 2 to 3 days depending upon drainage.  Recheck in 1 week.  If she continues to have significant slough, may begin enzymatic debridement with Santyl.  I gave the patient dressing supplies and directions to 1 dressing changes until I see her back in 1 week.

## 2021-10-24 ENCOUNTER — Telehealth: Payer: Self-pay | Admitting: Family Medicine

## 2021-10-24 NOTE — Telephone Encounter (Signed)
Patient called to follow up on patch/newskin; requesting more. Patient stated area is burning a lot and has been since it was changed yesterday (patient was up all night dealing with the painful burning sensation). She wants to know if she can send someone to pick some up.  Please advise at [phone].

## 2021-10-25 ENCOUNTER — Ambulatory Visit (INDEPENDENT_AMBULATORY_CARE_PROVIDER_SITE_OTHER): Payer: Medicare Other | Admitting: Family Medicine

## 2021-10-25 VITALS — BP 130/82 | HR 86 | Temp 97.9°F | Ht 63.0 in | Wt 140.0 lb

## 2021-10-25 DIAGNOSIS — S81811D Laceration without foreign body, right lower leg, subsequent encounter: Secondary | ICD-10-CM | POA: Diagnosis not present

## 2021-10-25 DIAGNOSIS — Z23 Encounter for immunization: Secondary | ICD-10-CM

## 2021-10-25 MED ORDER — COLLAGENASE 250 UNIT/GM EX OINT: TOPICAL_OINTMENT | Freq: Every day | CUTANEOUS | Status: DC

## 2021-10-25 NOTE — Progress Notes (Deleted)
   Acute Office Visit  Subjective:     Patient ID: [name], female    DOB: August 11, 1931, 88 y.o.   MRN: [id number]  Chief Complaint  Patient presents with   Follow-up    bandage change (area red up to back of knee; doesn't have any more bandages)    HPI Patient is in today for ***  ROS      Objective:    BP (!) 140/72   Pulse 86   Temp 97.9 F (36.6 C) (Oral)   Ht 5\' 3"  (1.6 m)   Wt 140 lb (63.5 kg)   SpO2 92%   BMI 24.80 kg/m  {Vitals History (Optional):23777}  Physical Exam  No results found for any visits on 10/25/21.      Assessment & Plan:   Problem List Items Addressed This Visit   None   No orders of the defined types were placed in this encounter.   No follow-ups on file.  Rubie Maid, FNP

## 2021-10-25 NOTE — Progress Notes (Signed)
Subjective:    [name] ID: [name], female    DOB: 12-Jun-1931, 88 y.o.   MRN: [id number]  HPI  [name] is a pleasant 88 year old female here today for wound follow-up. She has been performing dressing changes at home but unfortunately the wound is not healing and she is concerned she may need antibiotics. She denies fever, chills, body aches, purulent drainage, or leg pain. There is yellow slough and areas of necrosis covering the wound. I will order santyl ointment and refer for wound care.   History provided for reference only  Dr Dennard Schaumann 10/09/21:  [name] suffered a skin tear on her lower right leg Friday of last week.  This morning she presents with erythema extending up the left leg as shown above in the photograph.  The skin is red hot warm to the touch and painful.  She is on prednisone for PMR.  She is currently taking 5 mg a day.  At that time, my plan was:  [name] has rapidly advancing cellulitis of the right lower extremity.  [name] received 1 g of Rocephin IM x1 now and I will start her on Keflex 500 mg 3 times daily for 7 days.  I placed Silvadene on nonadherent gauze to cover the skin tear and then wrapped the leg with Coban.  Recheck the [name] on Thursday or Friday.  Seek medical attention immediately if worsening.  Dr Dennard Schaumann 10/12/21 [name] states that her leg was burning all night long on Monday night.  The Silvadene that I placed on the wound was "burning" causing her significant discomfort.  She states that starting the following day, the burning started to improve.  She still states that the skin tear is very tender and sore.  However the warmth and the redness is starting to fade.  The pain is improving.  There is no extension of the cellulitis.  She denies any fevers or chills.  She is ambulating using a walker.  At that time, my plan was:  The cellulitis is stable or slightly better.  The warmth of the skin has improved.  The pain has improved.  The  color is essentially unchanged.  I covered a nonadherent gauze with Neosporin in place that over the wound/skin tear.  I then gently wrapped the gauze using Curlex.  I will recheck the [name] next week.  I will give the [name] additional days on antibiotics before forming any debridement of the skin tear.  Some of the skin is starting to turn yellow and may require debridement next week.  There is no drainage today.  There is no foul odor.  There is no evidence of any abscess formation  Dr Dennard Schaumann 10/19/21  Fortunately, the erythema surrounding the wound has faded.  The [name] states the pain surrounding the wound has improved.  However there is yellow slough now covering skin tear.  [name] denies any fever or chills.  There is no foul-smelling drainage.    Past Medical History:  Diagnosis Date   Cataract    CKD (chronic kidney disease) stage 3, GFR 30-59 ml/min (HCC)    Fatty liver disease, nonalcoholic    [id number] admitted with encephalopathy unsure of cause, resolved sponatneously   Hypertension    Past Surgical History:  Procedure Laterality Date   FRACTURE SURGERY     right shoulder, both wrists    JOINT REPLACEMENT     HIP- bilateral   Current Outpatient Medications on File Prior to Visit  Medication  Sig Dispense Refill   ALPRAZolam (XANAX) 0.5 MG tablet Take 1 tablet (0.5 mg total) by mouth 3 (three) times daily as needed for anxiety. 90 tablet 0   amLODipine (NORVASC) 5 MG tablet Take 1 tablet by mouth once daily 90 tablet 1   cephALEXin (KEFLEX) 500 MG capsule Take 1 capsule (500 mg total) by mouth 3 (three) times daily. 21 capsule 0   denosumab (PROLIA) 60 MG/ML SOSY injection Inject 60 mg into the skin every 6 (six) months.     diclofenac (VOLTAREN) 75 MG EC tablet Take 1 tablet by mouth twice daily 60 tablet 0   diclofenac Sodium (VOLTAREN) 1 % GEL Apply topically 4 (four) times daily.     escitalopram (LEXAPRO) 10 MG tablet Take 1 tablet by mouth once daily 90 tablet 0    gabapentin (NEURONTIN) 100 MG capsule Take 1 capsule by mouth 3 times a day as needed for nerve pain 90 capsule 11   predniSONE (DELTASONE) 10 MG tablet Take 1 tablet (10 mg total) by mouth daily with breakfast. 90 tablet 1   traMADol (ULTRAM) 50 MG tablet Take 1 tablet (50 mg total) by mouth every 6 (six) hours as needed. 120 tablet 2   Current Facility-Administered Medications on File Prior to Visit  Medication Dose Route Frequency Provider Last Rate Last Admin   denosumab (PROLIA) injection 60 mg  60 mg Subcutaneous Q6 months Pickard, Warren T, MD   60 mg at 07/26/20 1011   Allergies  Allergen Reactions   Pneumococcal Vaccines Other (See Comments)    Pt had localized reaction to injection of Pneumococcal 23 - Swelling and redness in upper arm that extend to but not beyond elbow   Social History   Socioeconomic History   Marital status: Widowed    Spouse name: Not on file   Number of children: 1   Years of education: Not on file   Highest education level: Not on file  Occupational History   Not on file  Tobacco Use   Smoking status: Never   Smokeless tobacco: Never  Vaping Use   Vaping Use: Never used  Substance and Sexual Activity   Alcohol use: Not Currently   Drug use: Never   Sexual activity: Not Currently    Comment: raised tobacco, retired  Other Topics Concern   Not on file  Social History Narrative   Lives alone   Right handed    Caffeine: 1/2-1 cup coffee in AM   Social Determinants of Health   Financial Resource Strain: Low Risk  (03/16/2021)   Overall Financial Resource Strain (CARDIA)    Difficulty of Paying Living Expenses: Not very hard  Food Insecurity: No Food Insecurity (03/16/2021)   Hunger Vital Sign    Worried About Running Out of Food in the Last Year: Never true    Ran Out of Food in the Last Year: Never true  Transportation Needs: No Transportation Needs (03/16/2021)   PRAPARE - Administrator, Civil Service (Medical): No    Lack of  Transportation (Non-Medical): No  Physical Activity: Inactive (03/16/2021)   Exercise Vital Sign    Days of Exercise per Week: 0 days    Minutes of Exercise per Session: 0 min  Stress: No Stress Concern Present (03/16/2021)   [employer] of Occupational Health - Occupational Stress Questionnaire    Feeling of Stress : Not at all  Social Connections: Moderately Integrated (03/16/2021)   Social Connection and Isolation Panel [NHANES]  Frequency of Communication with Friends and Family: More than three times a week    Frequency of Social Gatherings with Friends and Family: More than three times a week    Attends Religious Services: More than 4 times per year    Active Member of [employer] or Organizations: Yes    Attends Archivist Meetings: More than 4 times per year    Marital Status: Widowed  Intimate Partner Violence: Not At Risk (03/16/2021)   Humiliation, Afraid, Rape, and Kick questionnaire    Fear of Current or Ex-Partner: No    Emotionally Abused: No    Physically Abused: No    Sexually Abused: No     Review of Systems  Constitutional: Negative.  Negative for chills, diaphoresis, fever and malaise/fatigue.  Cardiovascular:  Negative for leg swelling.  Neurological:  Negative for weakness.  All other systems reviewed and are negative.      Objective:   Physical Exam Vitals and nursing note reviewed.  Constitutional:      Appearance: Normal appearance. She is well-developed.  Musculoskeletal:     Right knee: Laceration present. No MCL or LCL tenderness.       Legs:  Skin:    Findings: Lesion present. No erythema.  Neurological:     General: No focal deficit present.     Mental Status: She is alert and oriented to person, place, and time. Mental status is at baseline.  Psychiatric:        Mood and Affect: Mood normal.        Behavior: Behavior normal.        Thought Content: Thought content normal.        Judgment: Judgment normal.            Assessment & Plan:  Noninfected skin tear of right lower extremity, subsequent encounter - Plan: collagenase (SANTYL) ointment, AMB referral to wound care center  Need for vaccination - Plan: Tdap vaccine greater than or equal to 7yo IM  Patients wound continues to have areas of yellow slough and necrosis covering the open area. I ordered santyl ointment and provided dressing change supplies and instructions. I am also ordering wound care referral. Instructed to call back if wound becomes red, swollen, drains pus, or fever/chills/nausea/vomiting.  Review of Systems  Constitutional: Negative.  Negative for chills, diaphoresis, fever and malaise/fatigue.  Cardiovascular:  Negative for leg swelling.  Neurological:  Negative for weakness.  All other systems reviewed and are negative.

## 2021-10-26 ENCOUNTER — Ambulatory Visit: Payer: Medicare Other | Admitting: Family Medicine

## 2021-10-26 ENCOUNTER — Telehealth: Payer: Self-pay

## 2021-10-26 ENCOUNTER — Other Ambulatory Visit: Payer: Self-pay | Admitting: Family Medicine

## 2021-10-26 DIAGNOSIS — F5101 Primary insomnia: Secondary | ICD-10-CM

## 2021-10-26 MED ORDER — ALPRAZOLAM 0.5 MG PO TABS: 0.5000 mg | ORAL_TABLET | Freq: Three times a day (TID) | ORAL | 0 refills | Status: DC | PRN

## 2021-10-26 MED ORDER — SANTYL 250 UNIT/GM EX OINT: 1.0000 | TOPICAL_OINTMENT | Freq: Every day | CUTANEOUS | 0 refills | Status: DC

## 2021-10-26 NOTE — Telephone Encounter (Signed)
Received call from Freda Munro at the [facility] to follow up on referral received for new patient appt. Freda Munro stated patient unable to come in at 8:30am which is their only available designated new patient slot for the time being (office short staffed).   Freda Munro stated patient told her the wound has improved. They will refer her to the office on [address] to see if a sooner appt is available.   For additional information, please contact Freda Munro at [phone].

## 2021-10-26 NOTE — Telephone Encounter (Signed)
Pt called in to inquire about a med that was supposed to be sent into pharmacy. Pt states that med is a cream that she is supposed to apply to leg pt was unsure of the name of the med. Pt also states that she needs a refill of ALPRAZolam (XANAX) 0.5 MG tablet [[id number]] sent to pharmacy. Pt would like to know when and if these meds will be sent in today.  Cb#: [phone]   PHARMACY: [facility] [address], [place]

## 2021-11-06 ENCOUNTER — Ambulatory Visit (INDEPENDENT_AMBULATORY_CARE_PROVIDER_SITE_OTHER): Payer: Medicare Other | Admitting: Family Medicine

## 2021-11-06 VITALS — BP 120/78 | HR 78 | Temp 97.7°F | Wt 140.0 lb

## 2021-11-06 DIAGNOSIS — S81811D Laceration without foreign body, right lower leg, subsequent encounter: Secondary | ICD-10-CM

## 2021-11-06 NOTE — Progress Notes (Signed)
Subjective:    Patient ID: [name], female    DOB: 10/26/1931, 88 y.o.   MRN: [id number]  HPI 10/09/21  Patient suffered a skin tear on her lower right leg Friday of last week.  This morning she presents with erythema extending up the left leg as shown above in the photograph.  The skin is red hot warm to the touch and painful.  She is on prednisone for PMR.  She is currently taking 5 mg a day.  At that time, my plan was:  Patient has rapidly advancing cellulitis of the right lower extremity.  Patient received 1 g of Rocephin IM x1 now and I will start her on Keflex 500 mg 3 times daily for 7 days.  I placed Silvadene on nonadherent gauze to cover the skin tear and then wrapped the leg with Coban.  Recheck the patient on Thursday or Friday.  Seek medical attention immediately if worsening.  10/12/21 Patient states that her leg was burning all night long on Monday night.  The Silvadene that I placed on the wound was "burning" causing her significant discomfort.  She states that starting the following day, the burning started to improve.  She still states that the skin tear is very tender and sore.  However the warmth and the redness is starting to fade.  The pain is improving.  There is no extension of the cellulitis.  She denies any fevers or chills.  She is ambulating using a walker.  At that time, my plan was:  The cellulitis is stable or slightly better.  The warmth of the skin has improved.  The pain has improved.  The color is essentially unchanged.  I covered a nonadherent gauze with Neosporin in place that over the wound/skin tear.  I then gently wrapped the gauze using Curlex.  I will recheck the patient next week.  I will give the patient additional days on antibiotics before forming any debridement of the skin tear.  Some of the skin is starting to turn yellow and may require debridement next week.  There is no drainage today.  There is no foul odor.  There is no evidence of any  abscess formation  10/19/21  Fortunately, the erythema surrounding the wound has faded.  The patient states the pain surrounding the wound has improved.  However there is yellow slough now covering skin tear.  Patient denies any fever or chills.  There is no foul-smelling drainage.  11/06/21  Patient is here today for recheck.  She has been applying Santyl to the wound on a daily basis and then keeping covered with gauze.  She reports soreness at the inferior aspect of the wound.  There is black dry eschar in the center of the wound as shown above.  There is dry thick yellow slough covering the entire wound. Past Medical History:  Diagnosis Date   Cataract    CKD (chronic kidney disease) stage 3, GFR 30-59 ml/min (HCC)    Fatty liver disease, nonalcoholic    4854- admitted with encephalopathy unsure of cause, resolved sponatneously   Hypertension    Past Surgical History:  Procedure Laterality Date   FRACTURE SURGERY     right shoulder, both wrists    JOINT REPLACEMENT     HIP- bilateral   Current Outpatient Medications on File Prior to Visit  Medication Sig Dispense Refill   ALPRAZolam (XANAX) 0.5 MG tablet Take 1 tablet (0.5 mg total) by mouth 3 (three) times daily  as needed for anxiety. 90 tablet 0   amLODipine (NORVASC) 5 MG tablet Take 1 tablet by mouth once daily 90 tablet 1   cephALEXin (KEFLEX) 500 MG capsule Take 1 capsule (500 mg total) by mouth 3 (three) times daily. 21 capsule 0   collagenase (SANTYL) 250 UNIT/GM ointment Apply 1 Application topically daily. 90 g 0   denosumab (PROLIA) 60 MG/ML SOSY injection Inject 60 mg into the skin every 6 (six) months.     diclofenac (VOLTAREN) 75 MG EC tablet Take 1 tablet by mouth twice daily 60 tablet 0   diclofenac Sodium (VOLTAREN) 1 % GEL Apply topically 4 (four) times daily.     escitalopram (LEXAPRO) 10 MG tablet Take 1 tablet by mouth once daily 90 tablet 0   gabapentin (NEURONTIN) 100 MG capsule Take 1 capsule by mouth 3  times a day as needed for nerve pain 90 capsule 11   predniSONE (DELTASONE) 10 MG tablet Take 1 tablet (10 mg total) by mouth daily with breakfast. 90 tablet 1   traMADol (ULTRAM) 50 MG tablet Take 1 tablet (50 mg total) by mouth every 6 (six) hours as needed. 120 tablet 2   Current Facility-Administered Medications on File Prior to Visit  Medication Dose Route Frequency Provider Last Rate Last Admin   collagenase (SANTYL) ointment   Topical Daily Rubie Maid, FNP       denosumab (PROLIA) injection 60 mg  60 mg Subcutaneous Q6 months Koven Belinsky T, MD   60 mg at 07/26/20 1011   Allergies  Allergen Reactions   Pneumococcal Vaccines Other (See Comments)    Pt had localized reaction to injection of Pneumococcal 23 - Swelling and redness in upper arm that extend to but not beyond elbow   Social History   Socioeconomic History   Marital status: Widowed    Spouse name: Not on file   Number of children: 1   Years of education: Not on file   Highest education level: Not on file  Occupational History   Not on file  Tobacco Use   Smoking status: Never   Smokeless tobacco: Never  Vaping Use   Vaping Use: Never used  Substance and Sexual Activity   Alcohol use: Not Currently   Drug use: Never   Sexual activity: Not Currently    Comment: raised tobacco, retired  Other Topics Concern   Not on file  Social History Narrative   Lives alone   Right handed    Caffeine: 1/2-1 cup coffee in AM   Social Determinants of Health   Financial Resource Strain: Low Risk  (03/16/2021)   Overall Financial Resource Strain (CARDIA)    Difficulty of Paying Living Expenses: Not very hard  Food Insecurity: No Food Insecurity (03/16/2021)   Hunger Vital Sign    Worried About Running Out of Food in the Last Year: Never true    Castor in the Last Year: Never true  Transportation Needs: No Transportation Needs (03/16/2021)   PRAPARE - Hydrologist (Medical): No     Lack of Transportation (Non-Medical): No  Physical Activity: Inactive (03/16/2021)   Exercise Vital Sign    Days of Exercise per Week: 0 days    Minutes of Exercise per Session: 0 min  Stress: No Stress Concern Present (03/16/2021)   [place]    Feeling of Stress : Not at all  Social Connections: Moderately Integrated (03/16/2021)  Social Licensed conveyancer [NHANES]    Frequency of Communication with Friends and Family: More than three times a week    Frequency of Social Gatherings with Friends and Family: More than three times a week    Attends Religious Services: More than 4 times per year    Active Member of [employer] or Organizations: Yes    Attends Archivist Meetings: More than 4 times per year    Marital Status: Widowed  Intimate Partner Violence: Not At Risk (03/16/2021)   Humiliation, Afraid, Rape, and Kick questionnaire    Fear of Current or Ex-Partner: No    Emotionally Abused: No    Physically Abused: No    Sexually Abused: No     Review of Systems  All other systems reviewed and are negative.      Objective:   Physical Exam Constitutional:      Appearance: She is well-developed.  Cardiovascular:     Rate and Rhythm: Normal rate and regular rhythm.     Heart sounds: Normal heart sounds.  Pulmonary:     Effort: Pulmonary effort is normal. No respiratory distress.     Breath sounds: Normal breath sounds. No stridor. No wheezing or rales.  Musculoskeletal:     Right knee: Laceration present. No MCL or LCL tenderness.       Legs:  Skin:    Findings: Lesion present. No erythema.      Please see the photograph in the history of present illness     Assessment & Plan:  Noninfected skin tear of right lower extremity, subsequent encounter Patient has been contacted by the wound clinic.  However they cannot see her until October 31.  We will try to get her into [place]  quicker than this.  I soaked the wound today and peroxide to soften the thick black eschar as well as the dried yellow slough.  Then using 4 x 4 gauze that was dry I slowly debrided the wound with gentle rubbing.  After removing the black eschar from the center of the wound and debriding the thick yellow slough and dead tissue with gentle rubbing, the wound appears cleaner and there appears to be healthy pink granulation tissue beneath the yellow slough.  I then covered the wound with Neosporin, petroleum gauze, and a nonadherent gauze.  I then wrapped the leg w.  Ith Coban.  Repeat mechanical debridement with friction and gauze in 48 hours to facilitate wound healing.  No evidence of a secondary infection

## 2021-11-08 ENCOUNTER — Ambulatory Visit (INDEPENDENT_AMBULATORY_CARE_PROVIDER_SITE_OTHER): Payer: Medicare Other | Admitting: Family Medicine

## 2021-11-08 VITALS — HR 56 | Ht 63.0 in | Wt 140.0 lb

## 2021-11-08 DIAGNOSIS — S81811D Laceration without foreign body, right lower leg, subsequent encounter: Secondary | ICD-10-CM | POA: Diagnosis not present

## 2021-11-08 NOTE — Progress Notes (Signed)
Subjective:    Patient ID: [name], female    DOB: [birth year], 88 y.o.   MRN: [id number]  HPI 10/09/21  Patient suffered a skin tear on her lower right leg Friday of last week.  This morning she presents with erythema extending up the left leg as shown above in the photograph.  The skin is red hot warm to the touch and painful.  She is on prednisone for PMR.  She is currently taking 5 mg a day.  At that time, my plan was:  Patient has rapidly advancing cellulitis of the right lower extremity.  Patient received 1 g of Rocephin IM x1 now and I will start her on Keflex 500 mg 3 times daily for 7 days.  I placed Silvadene on nonadherent gauze to cover the skin tear and then wrapped the leg with Coban.  Recheck the patient on Thursday or Friday.  Seek medical attention immediately if worsening.  10/12/21 Patient states that her leg was burning all night long on Monday night.  The Silvadene that I placed on the wound was "burning" causing her significant discomfort.  She states that starting the following day, the burning started to improve.  She still states that the skin tear is very tender and sore.  However the warmth and the redness is starting to fade.  The pain is improving.  There is no extension of the cellulitis.  She denies any fevers or chills.  She is ambulating using a walker.  At that time, my plan was:  The cellulitis is stable or slightly better.  The warmth of the skin has improved.  The pain has improved.  The color is essentially unchanged.  I covered a nonadherent gauze with Neosporin in place that over the wound/skin tear.  I then gently wrapped the gauze using Curlex.  I will recheck the patient next week.  I will give the patient additional days on antibiotics before forming any debridement of the skin tear.  Some of the skin is starting to turn yellow and may require debridement next week.  There is no drainage today.  There is no foul odor.  There is no evidence of any  abscess formation  10/19/21  Fortunately, the erythema surrounding the wound has faded.  The patient states the pain surrounding the wound has improved.  However there is yellow slough now covering skin tear.  Patient denies any fever or chills.  There is no foul-smelling drainage.  11/06/21  Patient is here today for recheck.  She has been applying Santyl to the wound on a daily basis and then keeping covered with gauze.  She reports soreness at the inferior aspect of the wound.  There is black dry eschar in the center of the wound as shown above.  There is dry thick yellow slough covering the entire wound.  At that time, my plan was: Patient has been contacted by the wound clinic.  However they cannot see her until October 31.  We will try to get her into [place] quicker than this.  I soaked the wound today and peroxide to soften the thick black eschar as well as the dried yellow slough.  Then using 4 x 4 gauze that was dry I slowly debrided the wound with gentle rubbing.  After removing the black eschar from the center of the wound and debriding the thick yellow slough and dead tissue with gentle rubbing, the wound appears cleaner and there appears to be healthy pink granulation  tissue beneath the yellow slough.  I then covered the wound with Neosporin, petroleum gauze, and a nonadherent gauze.  I then wrapped the leg with Coban.  Repeat mechanical debridement with friction and gauze in 48 hours to facilitate wound healing.  No evidence of a secondary infection  11/08/21  Patient is here today for recheck.  I took 4 x 4 gauze and soaked in peroxide and laid that over the wound and allowed to soak for 30 minutes.  I then used dry gauze and gentle rubbing for mechanical debridement. Past Medical History:  Diagnosis Date   Cataract    CKD (chronic kidney disease) stage 3, GFR 30-59 ml/min (HCC)    Fatty liver disease, nonalcoholic    4332- admitted with encephalopathy unsure of cause, resolved  sponatneously   Hypertension    Past Surgical History:  Procedure Laterality Date   FRACTURE SURGERY     right shoulder, both wrists    JOINT REPLACEMENT     HIP- bilateral   Current Outpatient Medications on File Prior to Visit  Medication Sig Dispense Refill   ALPRAZolam (XANAX) 0.5 MG tablet Take 1 tablet (0.5 mg total) by mouth 3 (three) times daily as needed for anxiety. 90 tablet 0   amLODipine (NORVASC) 5 MG tablet Take 1 tablet by mouth once daily 90 tablet 1   cephALEXin (KEFLEX) 500 MG capsule Take 1 capsule (500 mg total) by mouth 3 (three) times daily. 21 capsule 0   collagenase (SANTYL) 250 UNIT/GM ointment Apply 1 Application topically daily. 90 g 0   denosumab (PROLIA) 60 MG/ML SOSY injection Inject 60 mg into the skin every 6 (six) months.     diclofenac (VOLTAREN) 75 MG EC tablet Take 1 tablet by mouth twice daily 60 tablet 0   diclofenac Sodium (VOLTAREN) 1 % GEL Apply topically 4 (four) times daily.     escitalopram (LEXAPRO) 10 MG tablet Take 1 tablet by mouth once daily 90 tablet 0   gabapentin (NEURONTIN) 100 MG capsule Take 1 capsule by mouth 3 times a day as needed for nerve pain 90 capsule 11   predniSONE (DELTASONE) 10 MG tablet Take 1 tablet (10 mg total) by mouth daily with breakfast. 90 tablet 1   traMADol (ULTRAM) 50 MG tablet Take 1 tablet (50 mg total) by mouth every 6 (six) hours as needed. 120 tablet 2   Current Facility-Administered Medications on File Prior to Visit  Medication Dose Route Frequency Provider Last Rate Last Admin   collagenase (SANTYL) ointment   Topical Daily Rubie Maid, FNP       denosumab (PROLIA) injection 60 mg  60 mg Subcutaneous Q6 months Aragon Scarantino T, MD   60 mg at 07/26/20 1011   Allergies  Allergen Reactions   Pneumococcal Vaccines Other (See Comments)    Pt had localized reaction to injection of Pneumococcal 23 - Swelling and redness in upper arm that extend to but not beyond elbow   Social History    Socioeconomic History   Marital status: Widowed    Spouse name: Not on file   Number of children: 1   Years of education: Not on file   Highest education level: Not on file  Occupational History   Not on file  Tobacco Use   Smoking status: Never   Smokeless tobacco: Never  Vaping Use   Vaping Use: Never used  Substance and Sexual Activity   Alcohol use: Not Currently   Drug use: Never   Sexual  activity: Not Currently    Comment: raised tobacco, retired  Other Topics Concern   Not on file  Social History Narrative   Lives alone   Right handed    Caffeine: 1/2-1 cup coffee in AM   Social Determinants of Health   Financial Resource Strain: Low Risk  (03/16/2021)   Overall Financial Resource Strain (CARDIA)    Difficulty of Paying Living Expenses: Not very hard  Food Insecurity: No Food Insecurity (03/16/2021)   Hunger Vital Sign    Worried About Running Out of Food in the Last Year: Never true    Ran Out of Food in the Last Year: Never true  Transportation Needs: No Transportation Needs (03/16/2021)   PRAPARE - Hydrologist (Medical): No    Lack of Transportation (Non-Medical): No  Physical Activity: Inactive (03/16/2021)   Exercise Vital Sign    Days of Exercise per Week: 0 days    Minutes of Exercise per Session: 0 min  Stress: No Stress Concern Present (03/16/2021)   [place]    Feeling of Stress : Not at all  Social Connections: Moderately Integrated (03/16/2021)   Social Connection and Isolation Panel [NHANES]    Frequency of Communication with Friends and Family: More than three times a week    Frequency of Social Gatherings with Friends and Family: More than three times a week    Attends Religious Services: More than 4 times per year    Active Member of [employer] or Organizations: Yes    Attends Archivist Meetings: More than 4 times per year    Marital Status:  Widowed  Intimate Partner Violence: Not At Risk (03/16/2021)   Humiliation, Afraid, Rape, and Kick questionnaire    Fear of Current or Ex-Partner: No    Emotionally Abused: No    Physically Abused: No    Sexually Abused: No     Review of Systems  All other systems reviewed and are negative.      Objective:   Physical Exam Constitutional:      Appearance: She is well-developed.  Cardiovascular:     Rate and Rhythm: Normal rate and regular rhythm.     Heart sounds: Normal heart sounds.  Pulmonary:     Effort: Pulmonary effort is normal. No respiratory distress.     Breath sounds: Normal breath sounds. No stridor. No wheezing or rales.  Musculoskeletal:     Right knee: Laceration present. No MCL or LCL tenderness.       Legs:  Skin:    Findings: Lesion present. No erythema.      Please see the photograph in the history of present illness     Assessment & Plan:  Noninfected skin tear of right lower extremity, subsequent encounter For mechanical debridement with gauze after soaking the wound with peroxide to soften the slough.  Applied Silvadene to nonadherent gauze and cover the wound with this.  Allow this dressing to remain in place for 48 hours.  Then have the patient replace the dressing with Santyl and nonadherent gauze on Saturday.  Recheck here on Monday

## 2021-11-12 ENCOUNTER — Ambulatory Visit (INDEPENDENT_AMBULATORY_CARE_PROVIDER_SITE_OTHER): Payer: Medicare Other | Admitting: Family Medicine

## 2021-11-12 VITALS — BP 118/72 | HR 76 | Temp 98.3°F | Ht 63.0 in | Wt 139.0 lb

## 2021-11-12 DIAGNOSIS — S81811D Laceration without foreign body, right lower leg, subsequent encounter: Secondary | ICD-10-CM | POA: Diagnosis not present

## 2021-11-12 DIAGNOSIS — Z23 Encounter for immunization: Secondary | ICD-10-CM

## 2021-11-12 MED ORDER — SILVER SULFADIAZINE 1 % EX CREA: 1.0000 | TOPICAL_CREAM | Freq: Every day | CUTANEOUS | 0 refills | Status: DC

## 2021-11-12 NOTE — Progress Notes (Signed)
Subjective:    Patient ID: [name], female    DOB: December 17, 1931, 88 y.o.   MRN: [id number]  HPI 10/09/21  Patient suffered a skin tear on her lower right leg Friday of last week.  This morning she presents with erythema extending up the left leg as shown above in the photograph.  The skin is red hot warm to the touch and painful.  She is on prednisone for PMR.  She is currently taking 5 mg a day.  At that time, my plan was:  Patient has rapidly advancing cellulitis of the right lower extremity.  Patient received 1 g of Rocephin IM x1 now and I will start her on Keflex 500 mg 3 times daily for 7 days.  I placed Silvadene on nonadherent gauze to cover the skin tear and then wrapped the leg with Coban.  Recheck the patient on Thursday or Friday.  Seek medical attention immediately if worsening.  10/12/21 Patient states that her leg was burning all night long on Monday night.  The Silvadene that I placed on the wound was "burning" causing her significant discomfort.  She states that starting the following day, the burning started to improve.  She still states that the skin tear is very tender and sore.  However the warmth and the redness is starting to fade.  The pain is improving.  There is no extension of the cellulitis.  She denies any fevers or chills.  She is ambulating using a walker.  At that time, my plan was:  The cellulitis is stable or slightly better.  The warmth of the skin has improved.  The pain has improved.  The color is essentially unchanged.  I covered a nonadherent gauze with Neosporin in place that over the wound/skin tear.  I then gently wrapped the gauze using Curlex.  I will recheck the patient next week.  I will give the patient additional days on antibiotics before forming any debridement of the skin tear.  Some of the skin is starting to turn yellow and may require debridement next week.  There is no drainage today.  There is no foul odor.  There is no evidence of any  abscess formation  10/19/21  Fortunately, the erythema surrounding the wound has faded.  The patient states the pain surrounding the wound has improved.  However there is yellow slough now covering skin tear.  Patient denies any fever or chills.  There is no foul-smelling drainage.  11/06/21  Patient is here today for recheck.  She has been applying Santyl to the wound on a daily basis and then keeping covered with gauze.  She reports soreness at the inferior aspect of the wound.  There is black dry eschar in the center of the wound as shown above.  There is dry thick yellow slough covering the entire wound.  At that time, my plan was: Patient has been contacted by the wound clinic.  However they cannot see her until October 31.  We will try to get her into [place] quicker than this.  I soaked the wound today and peroxide to soften the thick black eschar as well as the dried yellow slough.  Then using 4 x 4 gauze that was dry I slowly debrided the wound with gentle rubbing.  After removing the black eschar from the center of the wound and debriding the thick yellow slough and dead tissue with gentle rubbing, the wound appears cleaner and there appears to be healthy pink granulation  tissue beneath the yellow slough.  I then covered the wound with Neosporin, petroleum gauze, and a nonadherent gauze.  I then wrapped the leg with Coban.  Repeat mechanical debridement with friction and gauze in 48 hours to facilitate wound healing.  No evidence of a secondary infection  11/08/21  Patient is here today for recheck.  I took 4 x 4 gauze and soaked in peroxide and laid that over the wound and allowed to soak for 30 minutes.  I then used dry gauze and gentle rubbing for mechanical debridement.  At that time, my plan was:  For mechanical debridement with gauze after soaking the wound with peroxide to soften the slough.  Applied Silvadene to nonadherent gauze and cover the wound with this.  Allow this dressing  to remain in place for 48 hours.  Then have the patient replace the dressing with Santyl and nonadherent gauze on Saturday.  Recheck here on Monday  11/12/21 Leg continues to improve.  She reports less pain around the wound.  There is no necrotic material that requires debridement today.  There is healthy granulation tissue all throughout the base of the wound.  Skin edges show no evidence of epibole.   Past Medical History:  Diagnosis Date   Cataract    CKD (chronic kidney disease) stage 3, GFR 30-59 ml/min (HCC)    Fatty liver disease, nonalcoholic    5361- admitted with encephalopathy unsure of cause, resolved sponatneously   Hypertension    Past Surgical History:  Procedure Laterality Date   FRACTURE SURGERY     right shoulder, both wrists    JOINT REPLACEMENT     HIP- bilateral   Current Outpatient Medications on File Prior to Visit  Medication Sig Dispense Refill   ALPRAZolam (XANAX) 0.5 MG tablet Take 1 tablet (0.5 mg total) by mouth 3 (three) times daily as needed for anxiety. 90 tablet 0   amLODipine (NORVASC) 5 MG tablet Take 1 tablet by mouth once daily 90 tablet 1   cephALEXin (KEFLEX) 500 MG capsule Take 1 capsule (500 mg total) by mouth 3 (three) times daily. 21 capsule 0   collagenase (SANTYL) 250 UNIT/GM ointment Apply 1 Application topically daily. 90 g 0   denosumab (PROLIA) 60 MG/ML SOSY injection Inject 60 mg into the skin every 6 (six) months.     diclofenac (VOLTAREN) 75 MG EC tablet Take 1 tablet by mouth twice daily 60 tablet 0   diclofenac Sodium (VOLTAREN) 1 % GEL Apply topically 4 (four) times daily.     escitalopram (LEXAPRO) 10 MG tablet Take 1 tablet by mouth once daily 90 tablet 0   gabapentin (NEURONTIN) 100 MG capsule Take 1 capsule by mouth 3 times a day as needed for nerve pain 90 capsule 11   predniSONE (DELTASONE) 10 MG tablet Take 1 tablet (10 mg total) by mouth daily with breakfast. 90 tablet 1   traMADol (ULTRAM) 50 MG tablet Take 1 tablet (50 mg  total) by mouth every 6 (six) hours as needed. 120 tablet 2   Current Facility-Administered Medications on File Prior to Visit  Medication Dose Route Frequency Provider Last Rate Last Admin   collagenase (SANTYL) ointment   Topical Daily Mila Merry S, FNP       denosumab (PROLIA) injection 60 mg  60 mg Subcutaneous Q6 months Susy Frizzle, MD   60 mg at 07/26/20 1011   Allergies  Allergen Reactions   Pneumococcal Vaccines Other (See Comments)    Pt had  localized reaction to injection of Pneumococcal 23 - Swelling and redness in upper arm that extend to but not beyond elbow   Social History   Socioeconomic History   Marital status: Widowed    Spouse name: Not on file   Number of children: 1   Years of education: Not on file   Highest education level: Not on file  Occupational History   Not on file  Tobacco Use   Smoking status: Never   Smokeless tobacco: Never  Vaping Use   Vaping Use: Never used  Substance and Sexual Activity   Alcohol use: Not Currently   Drug use: Never   Sexual activity: Not Currently    Comment: raised tobacco, retired  Other Topics Concern   Not on file  Social History Narrative   Lives alone   Right handed    Caffeine: 1/2-1 cup coffee in AM   Social Determinants of Health   Financial Resource Strain: Low Risk  (03/16/2021)   Overall Financial Resource Strain (CARDIA)    Difficulty of Paying Living Expenses: Not very hard  Food Insecurity: No Food Insecurity (03/16/2021)   Hunger Vital Sign    Worried About Running Out of Food in the Last Year: Never true    [place] in the Last Year: Never true  Transportation Needs: No Transportation Needs (03/16/2021)   PRAPARE - Hydrologist (Medical): No    Lack of Transportation (Non-Medical): No  Physical Activity: Inactive (03/16/2021)   Exercise Vital Sign    Days of Exercise per Week: 0 days    Minutes of Exercise per Session: 0 min  Stress: No Stress  Concern Present (03/16/2021)   [place]    Feeling of Stress : Not at all  Social Connections: Moderately Integrated (03/16/2021)   Social Connection and Isolation Panel [NHANES]    Frequency of Communication with Friends and Family: More than three times a week    Frequency of Social Gatherings with Friends and Family: More than three times a week    Attends Religious Services: More than 4 times per year    Active Member of [employer] or Organizations: Yes    Attends Archivist Meetings: More than 4 times per year    Marital Status: Widowed  Intimate Partner Violence: Not At Risk (03/16/2021)   Humiliation, Afraid, Rape, and Kick questionnaire    Fear of Current or Ex-Partner: No    Emotionally Abused: No    Physically Abused: No    Sexually Abused: No     Review of Systems  All other systems reviewed and are negative.      Objective:   Physical Exam Constitutional:      Appearance: She is well-developed.  Cardiovascular:     Rate and Rhythm: Normal rate and regular rhythm.     Heart sounds: Normal heart sounds.  Pulmonary:     Effort: Pulmonary effort is normal. No respiratory distress.     Breath sounds: Normal breath sounds. No stridor. No wheezing or rales.  Musculoskeletal:     Right knee: Laceration present. No MCL or LCL tenderness.       Legs:  Skin:    Findings: Lesion present. No erythema.         Assessment & Plan:  Noninfected skin tear of right lower extremity, subsequent encounter Looks much better today.  Does not require any residual enzymatic debridement or mechanical debridement.  Recommended discontinuation of Santillo.  Now recommended that she apply Silvadene to nonadherent falls placed this over the wound and I gently wrapped the leg with an Ace bandage to hold the gauze in place.  Repeat these dressing changes on a daily basis.  Patient feels comfortable doing this herself at  home.  Recheck in 1 week

## 2021-11-19 ENCOUNTER — Ambulatory Visit: Payer: Medicare Other | Admitting: Family Medicine

## 2021-11-20 DIAGNOSIS — S81811D Laceration without foreign body, right lower leg, subsequent encounter: Secondary | ICD-10-CM | POA: Diagnosis not present

## 2021-11-20 DIAGNOSIS — H35372 Puckering of macula, left eye: Secondary | ICD-10-CM | POA: Diagnosis not present

## 2021-11-20 DIAGNOSIS — Z23 Encounter for immunization: Secondary | ICD-10-CM | POA: Diagnosis not present

## 2021-11-20 NOTE — Addendum Note (Signed)
Addended by: Colman Cater on: 11/20/2021 09:24 AM   Modules accepted: Orders

## 2021-11-22 ENCOUNTER — Ambulatory Visit (INDEPENDENT_AMBULATORY_CARE_PROVIDER_SITE_OTHER): Payer: Medicare Other | Admitting: Family Medicine

## 2021-11-22 VITALS — BP 120/70 | HR 77 | Ht 63.0 in | Wt 143.0 lb

## 2021-11-22 DIAGNOSIS — M5136 Other intervertebral disc degeneration, lumbar region: Secondary | ICD-10-CM | POA: Diagnosis not present

## 2021-11-22 DIAGNOSIS — M8580 Other specified disorders of bone density and structure, unspecified site: Secondary | ICD-10-CM

## 2021-11-22 DIAGNOSIS — S81811D Laceration without foreign body, right lower leg, subsequent encounter: Secondary | ICD-10-CM | POA: Diagnosis not present

## 2021-11-22 MED ORDER — COLLATYL EX GEL: 1.0000 | Freq: Every day | CUTANEOUS | 1 refills | Status: DC

## 2021-11-22 MED ORDER — TRAMADOL HCL 50 MG PO TABS: 50.0000 mg | ORAL_TABLET | Freq: Four times a day (QID) | ORAL | 2 refills | Status: DC | PRN

## 2021-11-22 NOTE — Progress Notes (Signed)
Subjective:    Patient ID: [name], female    DOB: July 22, 1931, 88 y.o.   MRN: [id number]  HPI 10/09/21  Patient suffered a skin tear on her lower right leg Friday of last week.  This morning she presents with erythema extending up the left leg as shown above in the photograph.  The skin is red hot warm to the touch and painful.  She is on prednisone for PMR.  She is currently taking 5 mg a day.  At that time, my plan was:  Patient has rapidly advancing cellulitis of the right lower extremity.  Patient received 1 g of Rocephin IM x1 now and I will start her on Keflex 500 mg 3 times daily for 7 days.  I placed Silvadene on nonadherent gauze to cover the skin tear and then wrapped the leg with Coban.  Recheck the patient on Thursday or Friday.  Seek medical attention immediately if worsening.  10/12/21 Patient states that her leg was burning all night long on Monday night.  The Silvadene that I placed on the wound was "burning" causing her significant discomfort.  She states that starting the following day, the burning started to improve.  She still states that the skin tear is very tender and sore.  However the warmth and the redness is starting to fade.  The pain is improving.  There is no extension of the cellulitis.  She denies any fevers or chills.  She is ambulating using a walker.  At that time, my plan was:  The cellulitis is stable or slightly better.  The warmth of the skin has improved.  The pain has improved.  The color is essentially unchanged.  I covered a nonadherent gauze with Neosporin in place that over the wound/skin tear.  I then gently wrapped the gauze using Curlex.  I will recheck the patient next week.  I will give the patient additional days on antibiotics before forming any debridement of the skin tear.  Some of the skin is starting to turn yellow and may require debridement next week.  There is no drainage today.  There is no foul odor.  There is no evidence of any  abscess formation  10/19/21  Fortunately, the erythema surrounding the wound has faded.  The patient states the pain surrounding the wound has improved.  However there is yellow slough now covering skin tear.  Patient denies any fever or chills.  There is no foul-smelling drainage.  11/06/21  Patient is here today for recheck.  She has been applying Santyl to the wound on a daily basis and then keeping covered with gauze.  She reports soreness at the inferior aspect of the wound.  There is black dry eschar in the center of the wound as shown above.  There is dry thick yellow slough covering the entire wound.  At that time, my plan was: Patient has been contacted by the wound clinic.  However they cannot see her until October 31.  We will try to get her into [place] quicker than this.  I soaked the wound today and peroxide to soften the thick black eschar as well as the dried yellow slough.  Then using 4 x 4 gauze that was dry I slowly debrided the wound with gentle rubbing.  After removing the black eschar from the center of the wound and debriding the thick yellow slough and dead tissue with gentle rubbing, the wound appears cleaner and there appears to be healthy pink granulation  tissue beneath the yellow slough.  I then covered the wound with Neosporin, petroleum gauze, and a nonadherent gauze.  I then wrapped the leg with Coban.  Repeat mechanical debridement with friction and gauze in 48 hours to facilitate wound healing.  No evidence of a secondary infection  11/08/21  Patient is here today for recheck.  I took 4 x 4 gauze and soaked in peroxide and laid that over the wound and allowed to soak for 30 minutes.  I then used dry gauze and gentle rubbing for mechanical debridement.  At that time, my plan was:  For mechanical debridement with gauze after soaking the wound with peroxide to soften the slough.  Applied Silvadene to nonadherent gauze and cover the wound with this.  Allow this dressing  to remain in place for 48 hours.  Then have the patient replace the dressing with Santyl and nonadherent gauze on Saturday.  Recheck here on Monday  11/12/21 Leg continues to improve.  She reports less pain around the wound.  There is no necrotic material that requires debridement today.  There is healthy granulation tissue all throughout the base of the wound.  Skin edges show no evidence of epibole.  At that time, my plan was:  Looks much better today.  Does not require any residual enzymatic debridement or mechanical debridement.  Recommended discontinuation of Santyl.  Now recommended that she apply Silvadene to nonadherent falls placed this over the wound and I gently wrapped the leg with an Ace bandage to hold the gauze in place.  Repeat these dressing changes on a daily basis.  Patient feels comfortable doing this herself at home.  Recheck in 1 week  11/22/21  Wound continues to heal.  My phone Malfunction today so I am unable to take photograph however it is decreased in size to 8 cm x 5 cm.  It is approximately 40% better from the last visit here.  There is still yellow slough adherent to the wound.  There is no evidence of any erythema or infection.   Past Medical History:  Diagnosis Date   Cataract    CKD (chronic kidney disease) stage 3, GFR 30-59 ml/min (HCC)    Fatty liver disease, nonalcoholic    1829- admitted with encephalopathy unsure of cause, resolved sponatneously   Hypertension    Past Surgical History:  Procedure Laterality Date   FRACTURE SURGERY     right shoulder, both wrists    JOINT REPLACEMENT     HIP- bilateral   Current Outpatient Medications on File Prior to Visit  Medication Sig Dispense Refill   ALPRAZolam (XANAX) 0.5 MG tablet Take 1 tablet (0.5 mg total) by mouth 3 (three) times daily as needed for anxiety. 90 tablet 0   amLODipine (NORVASC) 5 MG tablet Take 1 tablet by mouth once daily 90 tablet 1   cephALEXin (KEFLEX) 500 MG capsule Take 1 capsule  (500 mg total) by mouth 3 (three) times daily. 21 capsule 0   collagenase (SANTYL) 250 UNIT/GM ointment Apply 1 Application topically daily. 90 g 0   denosumab (PROLIA) 60 MG/ML SOSY injection Inject 60 mg into the skin every 6 (six) months.     diclofenac (VOLTAREN) 75 MG EC tablet Take 1 tablet by mouth twice daily 60 tablet 0   diclofenac Sodium (VOLTAREN) 1 % GEL Apply topically 4 (four) times daily.     escitalopram (LEXAPRO) 10 MG tablet Take 1 tablet by mouth once daily 90 tablet 0   gabapentin (NEURONTIN)  100 MG capsule Take 1 capsule by mouth 3 times a day as needed for nerve pain 90 capsule 11   predniSONE (DELTASONE) 10 MG tablet Take 1 tablet (10 mg total) by mouth daily with breakfast. 90 tablet 1   silver sulfADIAZINE (SILVADENE) 1 % cream Apply 1 Application topically daily. 85 g 0   traMADol (ULTRAM) 50 MG tablet Take 1 tablet (50 mg total) by mouth every 6 (six) hours as needed. 120 tablet 2   Current Facility-Administered Medications on File Prior to Visit  Medication Dose Route Frequency Provider Last Rate Last Admin   collagenase (SANTYL) ointment   Topical Daily Rubie Maid, FNP       denosumab (PROLIA) injection 60 mg  60 mg Subcutaneous Q6 months Tinsleigh Slovacek T, MD   60 mg at 07/26/20 1011   Allergies  Allergen Reactions   Pneumococcal Vaccines Other (See Comments)    Pt had localized reaction to injection of Pneumococcal 23 - Swelling and redness in upper arm that extend to but not beyond elbow   Social History   Socioeconomic History   Marital status: Widowed    Spouse name: Not on file   Number of children: 1   Years of education: Not on file   Highest education level: Not on file  Occupational History   Not on file  Tobacco Use   Smoking status: Never   Smokeless tobacco: Never  Vaping Use   Vaping Use: Never used  Substance and Sexual Activity   Alcohol use: Not Currently   Drug use: Never   Sexual activity: Not Currently    Comment: raised  tobacco, retired  Other Topics Concern   Not on file  Social History Narrative   Lives alone   Right handed    Caffeine: 1/2-1 cup coffee in AM   Social Determinants of Health   Financial Resource Strain: Low Risk  (03/16/2021)   Overall Financial Resource Strain (CARDIA)    Difficulty of Paying Living Expenses: Not very hard  Food Insecurity: No Food Insecurity (03/16/2021)   Hunger Vital Sign    Worried About Running Out of Food in the Last Year: Never true    [place] in the Last Year: Never true  Transportation Needs: No Transportation Needs (03/16/2021)   PRAPARE - Hydrologist (Medical): No    Lack of Transportation (Non-Medical): No  Physical Activity: Inactive (03/16/2021)   Exercise Vital Sign    Days of Exercise per Week: 0 days    Minutes of Exercise per Session: 0 min  Stress: No Stress Concern Present (03/16/2021)   [place]    Feeling of Stress : Not at all  Social Connections: Moderately Integrated (03/16/2021)   Social Connection and Isolation Panel [NHANES]    Frequency of Communication with Friends and Family: More than three times a week    Frequency of Social Gatherings with Friends and Family: More than three times a week    Attends Religious Services: More than 4 times per year    Active Member of [employer] or Organizations: Yes    Attends Archivist Meetings: More than 4 times per year    Marital Status: Widowed  Intimate Partner Violence: Not At Risk (03/16/2021)   Humiliation, Afraid, Rape, and Kick questionnaire    Fear of Current or Ex-Partner: No    Emotionally Abused: No    Physically Abused: No  Sexually Abused: No     Review of Systems  All other systems reviewed and are negative.      Objective:   Physical Exam Constitutional:      Appearance: She is well-developed.  Cardiovascular:     Rate and Rhythm: Normal rate and regular  rhythm.     Heart sounds: Normal heart sounds.  Pulmonary:     Effort: Pulmonary effort is normal. No respiratory distress.     Breath sounds: Normal breath sounds. No stridor. No wheezing or rales.  Musculoskeletal:     Right knee: Laceration present. No MCL or LCL tenderness.       Legs:  Skin:    Findings: Lesion present. No erythema.         Assessment & Plan:  Noninfected skin tear of right lower extremity, subsequent encounter I have recommended trying collatyl daily to be applied to the wound to remove the slough enzymatically and promote/expedite healing.  I will also refill her tramadol which she is having to use more frequently

## 2021-11-26 ENCOUNTER — Telehealth: Payer: Self-pay

## 2021-11-26 NOTE — Telephone Encounter (Signed)
Rec' fax  Medication Error- from [facility] regarding pt  I filled out form stating that pt did not missed dose on 11/19/21 and was not med error. I put on form, that pt has an appt w/provider on 11/22/21, Prolia was given at that time after visit w/provider per Karle Starch [employer], LPN.   Form was then put up front in "need to be fax' basket so it can be fax out.   Fax [phone]  Phone. [phone]

## 2021-11-27 ENCOUNTER — Telehealth: Payer: Self-pay

## 2021-11-27 NOTE — Telephone Encounter (Signed)
MEDICATION ISSUE: Pt states she is having issues with decreased appetite and change in taste since yesterday. Pt asks if they medication she started would cause this? The only medication I see in her chart is the Silver (Collatyl) Gel. Was she given an antibiotic, I didn't see on documented in yesterday's visit.

## 2021-11-27 NOTE — Telephone Encounter (Signed)
MEDICATION NOT AVAILABLE: Received fax from [facility] and they are unable to get the Siver (Collatyl) Gel. Is there another medication that can be sent in? Thank you.

## 2021-11-28 ENCOUNTER — Other Ambulatory Visit: Payer: Self-pay

## 2021-11-28 DIAGNOSIS — S81811D Laceration without foreign body, right lower leg, subsequent encounter: Secondary | ICD-10-CM

## 2021-11-28 DIAGNOSIS — L03115 Cellulitis of right lower limb: Secondary | ICD-10-CM

## 2021-11-28 MED ORDER — SILVER SULFADIAZINE 1 % EX CREA: 1.0000 | TOPICAL_CREAM | Freq: Every day | CUTANEOUS | 1 refills | Status: DC

## 2021-11-30 ENCOUNTER — Ambulatory Visit: Payer: Medicare Other | Admitting: Family Medicine

## 2021-11-30 ENCOUNTER — Ambulatory Visit (INDEPENDENT_AMBULATORY_CARE_PROVIDER_SITE_OTHER): Payer: Medicare Other | Admitting: Family Medicine

## 2021-11-30 ENCOUNTER — Encounter: Payer: Self-pay | Admitting: Family Medicine

## 2021-11-30 VITALS — BP 132/74 | HR 112 | Ht 63.0 in | Wt 137.0 lb

## 2021-11-30 DIAGNOSIS — N399 Disorder of urinary system, unspecified: Secondary | ICD-10-CM

## 2021-11-30 DIAGNOSIS — R410 Disorientation, unspecified: Secondary | ICD-10-CM

## 2021-11-30 LAB — URINALYSIS, ROUTINE W REFLEX MICROSCOPIC
Glucose, UA: NEGATIVE
Hgb urine dipstick: NEGATIVE
Ketones, ur: NEGATIVE
Nitrite: NEGATIVE
Specific Gravity, Urine: 1.019 (ref 1.001–1.035)
pH: 5 (ref 5.0–8.0)

## 2021-11-30 LAB — MICROSCOPIC MESSAGE

## 2021-11-30 MED ORDER — CEPHALEXIN 500 MG PO CAPS: 500.0000 mg | ORAL_CAPSULE | Freq: Three times a day (TID) | ORAL | 0 refills | Status: DC

## 2021-11-30 NOTE — Addendum Note (Signed)
Addended by: Randal Buba K on: 11/30/2021 03:17 PM   Modules accepted: Orders

## 2021-11-30 NOTE — Progress Notes (Signed)
Subjective:    Patient ID: [name], female    DOB: 07-10-1931, 88 y.o.   MRN: [id number]  HPI Patient presents today with her family for confusion and delirium.  Apparently on Saturday, she noticed that decrease in her sense of smell and taste.  She noticed an odor in her house that did not make her want to eat.  She also had severe diarrhea.  After Saturday she stopped taking all of her medications abruptly including prednisone, tramadol, amlodipine.  Throughout the course of this week she has started experiencing auditory hallucinations.  She is hearing music such as Arlice Colt in her head.  She denies any visual hallucinations.  Today she is very animated.  She is somewhat agitated beyond her baseline.  She is more confused than her baseline.  Cranial nerves II through XII are grossly intact muscle strength 5/5 equal and symmetric in the upper and lower extremities.  There is no obvious neurologic deficit to suggest a stroke.  She denies any cough or chest pain or shortness of breath.  She denies any abdominal pain.  Past Medical History:  Diagnosis Date   Cataract    CKD (chronic kidney disease) stage 3, GFR 30-59 ml/min (HCC)    Fatty liver disease, nonalcoholic    2007- admitted with encephalopathy unsure of cause, resolved sponatneously   Hypertension    Past Surgical History:  Procedure Laterality Date   FRACTURE SURGERY     right shoulder, both wrists    JOINT REPLACEMENT     HIP- bilateral   Current Outpatient Medications on File Prior to Visit  Medication Sig Dispense Refill   ALPRAZolam (XANAX) 0.5 MG tablet Take 1 tablet (0.5 mg total) by mouth 3 (three) times daily as needed for anxiety. 90 tablet 0   amLODipine (NORVASC) 5 MG tablet Take 1 tablet by mouth once daily 90 tablet 1   collagenase (SANTYL) 250 UNIT/GM ointment Apply 1 Application topically daily. 90 g 0   denosumab (PROLIA) 60 MG/ML SOSY injection Inject 60 mg into the skin every 6 (six) months.      diclofenac (VOLTAREN) 75 MG EC tablet Take 1 tablet by mouth twice daily 60 tablet 0   diclofenac Sodium (VOLTAREN) 1 % GEL Apply topically 4 (four) times daily.     escitalopram (LEXAPRO) 10 MG tablet Take 1 tablet by mouth once daily 90 tablet 0   predniSONE (DELTASONE) 10 MG tablet Take 1 tablet (10 mg total) by mouth daily with breakfast. 90 tablet 1   silver sulfADIAZINE (SILVADENE) 1 % cream Apply 1 Application topically daily. 85 g 1   traMADol (ULTRAM) 50 MG tablet Take 1 tablet (50 mg total) by mouth every 6 (six) hours as needed. 120 tablet 2   gabapentin (NEURONTIN) 100 MG capsule Take 1 capsule by mouth 3 times a day as needed for nerve pain (Patient not taking: Reported on 11/30/2021) 90 capsule 11   Current Facility-Administered Medications on File Prior to Visit  Medication Dose Route Frequency Provider Last Rate Last Admin   denosumab (PROLIA) injection 60 mg  60 mg Subcutaneous Q6 months Mekhi Lascola T, MD   60 mg at 11/22/21 1500     Allergies  Allergen Reactions   Pneumococcal Vaccines Other (See Comments)    Pt had localized reaction to injection of Pneumococcal 23 - Swelling and redness in upper arm that extend to but not beyond elbow   Social History   Socioeconomic History   Marital status:  Widowed    Spouse name: Not on file   Number of children: 1   Years of education: Not on file   Highest education level: Not on file  Occupational History   Not on file  Tobacco Use   Smoking status: Never   Smokeless tobacco: Never  Vaping Use   Vaping Use: Never used  Substance and Sexual Activity   Alcohol use: Not Currently   Drug use: Never   Sexual activity: Not Currently    Comment: raised tobacco, retired  Other Topics Concern   Not on file  Social History Narrative   Lives alone   Right handed    Caffeine: 1/2-1 cup coffee in AM   Social Determinants of Health   Financial Resource Strain: Low Risk  (03/16/2021)   Overall Financial Resource  Strain (CARDIA)    Difficulty of Paying Living Expenses: Not very hard  Food Insecurity: No Food Insecurity (03/16/2021)   Hunger Vital Sign    Worried About Running Out of Food in the Last Year: Never true    [place] in the Last Year: Never true  Transportation Needs: No Transportation Needs (03/16/2021)   PRAPARE - Hydrologist (Medical): No    Lack of Transportation (Non-Medical): No  Physical Activity: Inactive (03/16/2021)   Exercise Vital Sign    Days of Exercise per Week: 0 days    Minutes of Exercise per Session: 0 min  Stress: No Stress Concern Present (03/16/2021)   [place]    Feeling of Stress : Not at all  Social Connections: Moderately Integrated (03/16/2021)   Social Connection and Isolation Panel [NHANES]    Frequency of Communication with Friends and Family: More than three times a week    Frequency of Social Gatherings with Friends and Family: More than three times a week    Attends Religious Services: More than 4 times per year    Active Member of [employer] or Organizations: Yes    Attends Archivist Meetings: More than 4 times per year    Marital Status: Widowed  Intimate Partner Violence: Not At Risk (03/16/2021)   Humiliation, Afraid, Rape, and Kick questionnaire    Fear of Current or Ex-Partner: No    Emotionally Abused: No    Physically Abused: No    Sexually Abused: No     Review of Systems  All other systems reviewed and are negative.      Objective:   Physical Exam Vitals reviewed.  Constitutional:      General: She is not in acute distress.    Appearance: She is well-developed and normal weight. She is not ill-appearing, toxic-appearing or diaphoretic.  HENT:     Right Ear: Tympanic membrane and ear canal normal.     Left Ear: Tympanic membrane and ear canal normal.     Mouth/Throat:     Mouth: Mucous membranes are dry.     Pharynx: No  oropharyngeal exudate or posterior oropharyngeal erythema.  Eyes:     Extraocular Movements: Extraocular movements intact.     Conjunctiva/sclera: Conjunctivae normal.  Cardiovascular:     Rate and Rhythm: Regular rhythm. Tachycardia present.     Heart sounds: Normal heart sounds. No murmur heard.    No friction rub. No gallop.  Pulmonary:     Effort: Pulmonary effort is normal. No respiratory distress.     Breath sounds: Normal breath sounds. No stridor. No  wheezing or rales.  Abdominal:     General: Bowel sounds are normal. There is no distension.     Palpations: Abdomen is soft.     Tenderness: There is no abdominal tenderness. There is no guarding or rebound.  Musculoskeletal:     Right knee: Laceration present. No MCL or LCL tenderness.     Right lower leg: No edema.     Left lower leg: No edema.       Legs:  Lymphadenopathy:     Cervical: No cervical adenopathy.  Skin:    Findings: Lesion present. No erythema.  Neurological:     General: No focal deficit present.     Mental Status: She is alert. She is disoriented.     Cranial Nerves: No cranial nerve deficit.     Motor: No weakness.     Coordination: Coordination normal.     Gait: Gait normal.     Deep Tendon Reflexes: Reflexes normal.  Psychiatric:        Attention and Perception: She perceives auditory hallucinations.        Mood and Affect: Mood is not elated. Affect is not labile, blunt, flat, angry or tearful.        Speech: Speech normal.        Behavior: Behavior is agitated. Behavior is not slowed, aggressive, withdrawn, hyperactive or combative.        Cognition and Memory: She exhibits impaired recent memory.         Assessment & Plan:  Delirium - Plan: Urinalysis, Routine w reflex microscopic, CBC with Differential/Platelet, COMPLETE METABOLIC PANEL WITH GFR Patient appears to be delirious.  On examination today there is no evidence of stroke or increased intracranial pressure.  Medication issues  potentially can cause this.  Therefore I recommended that the family stop and supervise her medication.  Discontinue Xanax.  Resume prednisone 10 mg a day in case the patient is having an adrenal crisis due to the abrupt cessation of prednisone, continue tramadol 1 pill twice a day to avoid withdrawal.  Discontinue diclofenac and any other pain medication.  Check CBC and CMP.  Push fluids.  No evidence of pneumonia on exam, check urinalysis.  Urinalysis shows specific gravity greater than 1.030+1 protein trace leukocyte Estrace negative nitrates negative blood.  Although not markedly abnormal it is concerning for possible urinary tract infection coupled with dehydration.  Therefore I will start her on Keflex 500 g 3 times daily for 7 days.  Recheck here on Monday.  Push fluids over the weekend.  Recommended family supervise the patient until seen back in the clinic on Monday

## 2021-12-01 LAB — COMPLETE METABOLIC PANEL WITH GFR
AG Ratio: 1.7 (calc) (ref 1.0–2.5)
ALT: 14 U/L (ref 6–29)
AST: 24 U/L (ref 10–35)
Albumin: 4.4 g/dL (ref 3.6–5.1)
Alkaline phosphatase (APISO): 81 U/L (ref 37–153)
BUN/Creatinine Ratio: 22 (calc) (ref 6–22)
BUN: 39 mg/dL — ABNORMAL HIGH (ref 7–25)
CO2: 20 mmol/L (ref 20–32)
Calcium: 9.3 mg/dL (ref 8.6–10.4)
Chloride: 104 mmol/L (ref 98–110)
Creat: 1.79 mg/dL — ABNORMAL HIGH (ref 0.60–0.95)
Globulin: 2.6 g/dL (calc) (ref 1.9–3.7)
Glucose, Bld: 137 mg/dL — ABNORMAL HIGH (ref 65–99)
Potassium: 4.7 mmol/L (ref 3.5–5.3)
Sodium: 139 mmol/L (ref 135–146)
Total Bilirubin: 0.5 mg/dL (ref 0.2–1.2)
Total Protein: 7 g/dL (ref 6.1–8.1)
eGFR: 27 mL/min/{1.73_m2} — ABNORMAL LOW (ref >=60)

## 2021-12-01 LAB — CBC WITH DIFFERENTIAL/PLATELET
Absolute Monocytes: 516 cells/uL (ref 200–950)
Basophils Absolute: 36 cells/uL (ref 0–200)
Basophils Relative: 0.3 %
Eosinophils Absolute: 0 cells/uL — ABNORMAL LOW (ref 15–500)
Eosinophils Relative: 0 %
HCT: 41.6 % (ref 35.0–45.0)
Hemoglobin: 14 g/dL (ref 11.7–15.5)
Lymphs Abs: 816 cells/uL — ABNORMAL LOW (ref 850–3900)
MCH: 31 pg (ref 27.0–33.0)
MCHC: 33.7 g/dL (ref 32.0–36.0)
MCV: 92 fL (ref 80.0–100.0)
MPV: 10.5 fL (ref 7.5–12.5)
Monocytes Relative: 4.3 %
Neutro Abs: 10632 cells/uL — ABNORMAL HIGH (ref 1500–7800)
Neutrophils Relative %: 88.6 %
Platelets: 292 10*3/uL (ref 140–400)
RBC: 4.52 10*6/uL (ref 3.80–5.10)
RDW: 12.3 % (ref 11.0–15.0)
Total Lymphocyte: 6.8 %
WBC: 12 10*3/uL — ABNORMAL HIGH (ref 3.8–10.8)

## 2021-12-02 LAB — URINE CULTURE
MICRO NUMBER:: 14111398
SPECIMEN QUALITY:: ADEQUATE

## 2021-12-03 ENCOUNTER — Ambulatory Visit (INDEPENDENT_AMBULATORY_CARE_PROVIDER_SITE_OTHER): Payer: Medicare Other | Admitting: Family Medicine

## 2021-12-03 ENCOUNTER — Encounter: Payer: Self-pay | Admitting: Family Medicine

## 2021-12-03 VITALS — BP 120/64 | HR 86 | Wt 137.6 lb

## 2021-12-03 DIAGNOSIS — N289 Disorder of kidney and ureter, unspecified: Secondary | ICD-10-CM | POA: Diagnosis not present

## 2021-12-03 NOTE — Progress Notes (Signed)
Subjective:    Patient ID: [name], female    DOB: 20-Jan-1932, 88 y.o.   MRN: [id number]  HPI 11/30/21 Patient presents today with her family for confusion and delirium.  Apparently on Saturday, she noticed that decrease in her sense of smell and taste.  She noticed an odor in her house that did not make her want to eat.  She also had severe diarrhea.  After Saturday she stopped taking all of her medications abruptly including prednisone, tramadol, amlodipine.  Throughout the course of this week she has started experiencing auditory hallucinations.  She is hearing music such as Arlice Colt in her head.  She denies any visual hallucinations.  Today she is very animated.  She is somewhat agitated beyond her baseline.  She is more confused than her baseline.  Cranial nerves II through XII are grossly intact muscle strength 5/5 equal and symmetric in the upper and lower extremities.  There is no obvious neurologic deficit to suggest a stroke.  She denies any cough or chest pain or shortness of breath.  She denies any abdominal pain.  At that time, my plan was: Patient appears to be delirious.  On examination today there is no evidence of stroke or increased intracranial pressure.  Medication issues potentially can cause this.  Therefore I recommended that the family stop and supervise her medication.  Discontinue Xanax.  Resume prednisone 10 mg a day in case the patient is having an adrenal crisis due to the abrupt cessation of prednisone, continue tramadol 1 pill twice a day to avoid withdrawal.  Discontinue diclofenac and any other pain medication.  Check CBC and CMP.  Push fluids.  No evidence of pneumonia on exam, check urinalysis.  Urinalysis shows specific gravity greater than 1.030+1 protein trace leukocyte Estrace negative nitrates negative blood.  Although not markedly abnormal it is concerning for possible urinary tract infection coupled with dehydration.  Therefore I will start her on  Keflex 500 g 3 times daily for 7 days.  Recheck here on Monday.  Push fluids over the weekend.  Recommended family supervise the patient until seen back in the clinic on Monday  12/03/21 Labs showed acute renal insufficiency with a creatinine of 1.7, white blood cell count of 12, and a urine culture that showed less than 10,000 colonies of gram-positive cocci.  The patient is here today for follow-up.  After taking the antibiotic over the weekend and drinking more fluid, the delirium has subsided.  She is no longer having any additional hallucinations.  She is back to her baseline.  She denies any cough or chest pain or shortness of breath or nausea or vomiting or dysuria  Past Medical History:  Diagnosis Date   Cataract    CKD (chronic kidney disease) stage 3, GFR 30-59 ml/min (HCC)    Fatty liver disease, nonalcoholic    2007- admitted with encephalopathy unsure of cause, resolved sponatneously   Hypertension    Past Surgical History:  Procedure Laterality Date   FRACTURE SURGERY     right shoulder, both wrists    JOINT REPLACEMENT     HIP- bilateral   Current Outpatient Medications on File Prior to Visit  Medication Sig Dispense Refill   ALPRAZolam (XANAX) 0.5 MG tablet Take 1 tablet (0.5 mg total) by mouth 3 (three) times daily as needed for anxiety. 90 tablet 0   amLODipine (NORVASC) 5 MG tablet Take 1 tablet by mouth once daily 90 tablet 1   cephALEXin (KEFLEX) 500  MG capsule Take 1 capsule (500 mg total) by mouth 3 (three) times daily. 21 capsule 0   collagenase (SANTYL) 250 UNIT/GM ointment Apply 1 Application topically daily. 90 g 0   denosumab (PROLIA) 60 MG/ML SOSY injection Inject 60 mg into the skin every 6 (six) months.     diclofenac (VOLTAREN) 75 MG EC tablet Take 1 tablet by mouth twice daily 60 tablet 0   diclofenac Sodium (VOLTAREN) 1 % GEL Apply topically 4 (four) times daily.     escitalopram (LEXAPRO) 10 MG tablet Take 1 tablet by mouth once daily 90 tablet 0    gabapentin (NEURONTIN) 100 MG capsule Take 1 capsule by mouth 3 times a day as needed for nerve pain 90 capsule 11   predniSONE (DELTASONE) 10 MG tablet Take 1 tablet (10 mg total) by mouth daily with breakfast. 90 tablet 1   silver sulfADIAZINE (SILVADENE) 1 % cream Apply 1 Application topically daily. 85 g 1   traMADol (ULTRAM) 50 MG tablet Take 1 tablet (50 mg total) by mouth every 6 (six) hours as needed. 120 tablet 2   Current Facility-Administered Medications on File Prior to Visit  Medication Dose Route Frequency Provider Last Rate Last Admin   denosumab (PROLIA) injection 60 mg  60 mg Subcutaneous Q6 months Kassi Esteve T, MD   60 mg at 11/22/21 1500     Allergies  Allergen Reactions   Pneumococcal Vaccines Other (See Comments)    Pt had localized reaction to injection of Pneumococcal 23 - Swelling and redness in upper arm that extend to but not beyond elbow   Social History   Socioeconomic History   Marital status: Widowed    Spouse name: Not on file   Number of children: 1   Years of education: Not on file   Highest education level: Not on file  Occupational History   Not on file  Tobacco Use   Smoking status: Never   Smokeless tobacco: Never  Vaping Use   Vaping Use: Never used  Substance and Sexual Activity   Alcohol use: Not Currently   Drug use: Never   Sexual activity: Not Currently    Comment: raised tobacco, retired  Other Topics Concern   Not on file  Social History Narrative   Lives alone   Right handed    Caffeine: 1/2-1 cup coffee in AM   Social Determinants of Health   Financial Resource Strain: Low Risk  (03/16/2021)   Overall Financial Resource Strain (CARDIA)    Difficulty of Paying Living Expenses: Not very hard  Food Insecurity: No Food Insecurity (03/16/2021)   Hunger Vital Sign    Worried About Running Out of Food in the Last Year: Never true    Ran Out of Food in the Last Year: Never true  Transportation Needs: No Transportation Needs  (03/16/2021)   PRAPARE - Administrator, Civil Service (Medical): No    Lack of Transportation (Non-Medical): No  Physical Activity: Inactive (03/16/2021)   Exercise Vital Sign    Days of Exercise per Week: 0 days    Minutes of Exercise per Session: 0 min  Stress: No Stress Concern Present (03/16/2021)   [employer] of Occupational Health - Occupational Stress Questionnaire    Feeling of Stress : Not at all  Social Connections: Moderately Integrated (03/16/2021)   Social Connection and Isolation Panel [NHANES]    Frequency of Communication with Friends and Family: More than three times a week    Frequency  of Social Gatherings with Friends and Family: More than three times a week    Attends Religious Services: More than 4 times per year    Active Member of [employer] or Organizations: Yes    Attends Archivist Meetings: More than 4 times per year    Marital Status: Widowed  Intimate Partner Violence: Not At Risk (03/16/2021)   Humiliation, Afraid, Rape, and Kick questionnaire    Fear of Current or Ex-Partner: No    Emotionally Abused: No    Physically Abused: No    Sexually Abused: No     Review of Systems  All other systems reviewed and are negative.      Objective:   Physical Exam Vitals reviewed.  Constitutional:      General: She is not in acute distress.    Appearance: She is well-developed and normal weight. She is not ill-appearing, toxic-appearing or diaphoretic.  HENT:     Right Ear: Tympanic membrane and ear canal normal.     Left Ear: Tympanic membrane and ear canal normal.     Mouth/Throat:     Mouth: Mucous membranes are dry.     Pharynx: No oropharyngeal exudate or posterior oropharyngeal erythema.  Eyes:     Extraocular Movements: Extraocular movements intact.     Conjunctiva/sclera: Conjunctivae normal.  Cardiovascular:     Rate and Rhythm: Regular rhythm. Tachycardia present.     Heart sounds: Normal heart sounds. No murmur heard.     No friction rub. No gallop.  Pulmonary:     Effort: Pulmonary effort is normal. No respiratory distress.     Breath sounds: Normal breath sounds. No stridor. No wheezing or rales.  Abdominal:     General: Bowel sounds are normal. There is no distension.     Palpations: Abdomen is soft.     Tenderness: There is no abdominal tenderness. There is no guarding or rebound.  Musculoskeletal:     Right knee: Laceration present. No MCL or LCL tenderness.     Right lower leg: No edema.     Left lower leg: No edema.       Legs:  Lymphadenopathy:     Cervical: No cervical adenopathy.  Skin:    Findings: Lesion present. No erythema.  Neurological:     General: No focal deficit present.     Mental Status: She is alert. She is disoriented.     Cranial Nerves: No cranial nerve deficit.     Motor: No weakness.     Coordination: Coordination normal.     Gait: Gait normal.     Deep Tendon Reflexes: Reflexes normal.  Psychiatric:        Attention and Perception: Attention and perception normal.        Mood and Affect: Mood is not elated. Affect is not labile, blunt, flat, angry or tearful.        Speech: Speech normal.        Behavior: Behavior is agitated. Behavior is not slowed, aggressive, withdrawn, hyperactive or combative.         Assessment & Plan:  Renal insufficiency - Plan: CBC with Differential/Platelet, BASIC METABOLIC PANEL WITH GFR I believe the patient's delirium was primarily due to dehydration and possibly a mild urinary tract infection.  Repeat CBC and BMP today to monitor the leukocytosis and renal insufficiency.  If her labs are back to her baseline, no further treatment is necessary.  Wound on her right leg continues to heal gradually

## 2021-12-04 LAB — CBC WITH DIFFERENTIAL/PLATELET
Absolute Monocytes: 370 cells/uL (ref 200–950)
Basophils Absolute: 23 cells/uL (ref 0–200)
Basophils Relative: 0.3 %
Eosinophils Absolute: 0 cells/uL — ABNORMAL LOW (ref 15–500)
Eosinophils Relative: 0 %
HCT: 37.8 % (ref 35.0–45.0)
Hemoglobin: 13 g/dL (ref 11.7–15.5)
Lymphs Abs: 762 cells/uL — ABNORMAL LOW (ref 850–3900)
MCH: 31 pg (ref 27.0–33.0)
MCHC: 34.4 g/dL (ref 32.0–36.0)
MCV: 90 fL (ref 80.0–100.0)
MPV: 10.7 fL (ref 7.5–12.5)
Monocytes Relative: 4.8 %
Neutro Abs: 6545 cells/uL (ref 1500–7800)
Neutrophils Relative %: 85 %
Platelets: 248 10*3/uL (ref 140–400)
RBC: 4.2 10*6/uL (ref 3.80–5.10)
RDW: 12.5 % (ref 11.0–15.0)
Total Lymphocyte: 9.9 %
WBC: 7.7 10*3/uL (ref 3.8–10.8)

## 2021-12-04 LAB — BASIC METABOLIC PANEL WITH GFR
BUN/Creatinine Ratio: 17 (calc) (ref 6–22)
BUN: 20 mg/dL (ref 7–25)
CO2: 20 mmol/L (ref 20–32)
Calcium: 9 mg/dL (ref 8.6–10.4)
Chloride: 106 mmol/L (ref 98–110)
Creat: 1.18 mg/dL — ABNORMAL HIGH (ref 0.60–0.95)
Glucose, Bld: 115 mg/dL — ABNORMAL HIGH (ref 65–99)
Potassium: 4.3 mmol/L (ref 3.5–5.3)
Sodium: 139 mmol/L (ref 135–146)
eGFR: 45 mL/min/{1.73_m2} — ABNORMAL LOW (ref >=60)

## 2021-12-05 ENCOUNTER — Ambulatory Visit: Payer: Medicare Other | Admitting: Family Medicine

## 2021-12-06 ENCOUNTER — Ambulatory Visit: Payer: Medicare Other | Admitting: Family Medicine

## 2021-12-11 ENCOUNTER — Ambulatory Visit (INDEPENDENT_AMBULATORY_CARE_PROVIDER_SITE_OTHER): Payer: Medicare Other | Admitting: Family Medicine

## 2021-12-11 VITALS — BP 126/76 | HR 98 | Ht 63.0 in | Wt 140.4 lb

## 2021-12-11 DIAGNOSIS — S81811D Laceration without foreign body, right lower leg, subsequent encounter: Secondary | ICD-10-CM | POA: Diagnosis not present

## 2021-12-11 MED ORDER — ESCITALOPRAM OXALATE 10 MG PO TABS: 10.0000 mg | ORAL_TABLET | Freq: Every day | ORAL | 3 refills | Status: DC

## 2021-12-11 NOTE — Progress Notes (Signed)
Subjective:    Patient ID: [name], female    DOB: 06/27/1931, 88 y.o.   MRN: [id number]  HPI Patient is here today to recheck the wound on her leg.  It is healing nicely.  The posterior wound on her lower right lateral calf has almost completely closed.  The wound on her anterior lower right shin is gradually and slowly healing through secondary intention.  There is healthy granulation tissue at the base.  The wound does not have slough.  There is no erythema or drainage or evidence of a secondary infection.  She denies any pain.  She states she feels the best she has felt in a long time.  She would like to resume her Lexapro.  Recently she had stopped the Lexapro due to the delirium.  She feels like the medication was helping with her anxiety and she wants to resume it  Past Medical History:  Diagnosis Date   Cataract    CKD (chronic kidney disease) stage 3, GFR 30-59 ml/min (HCC)    Fatty liver disease, nonalcoholic    2007- admitted with encephalopathy unsure of cause, resolved sponatneously   Hypertension    Past Surgical History:  Procedure Laterality Date   FRACTURE SURGERY     right shoulder, both wrists    JOINT REPLACEMENT     HIP- bilateral   Current Outpatient Medications on File Prior to Visit  Medication Sig Dispense Refill   ALPRAZolam (XANAX) 0.5 MG tablet Take 1 tablet (0.5 mg total) by mouth 3 (three) times daily as needed for anxiety. 90 tablet 0   amLODipine (NORVASC) 5 MG tablet Take 1 tablet by mouth once daily 90 tablet 1   collagenase (SANTYL) 250 UNIT/GM ointment Apply 1 Application topically daily. 90 g 0   denosumab (PROLIA) 60 MG/ML SOSY injection Inject 60 mg into the skin every 6 (six) months.     diclofenac Sodium (VOLTAREN) 1 % GEL Apply topically 4 (four) times daily.     predniSONE (DELTASONE) 10 MG tablet Take 1 tablet (10 mg total) by mouth daily with breakfast. 90 tablet 1   silver sulfADIAZINE (SILVADENE) 1 % cream Apply 1 Application  topically daily. 85 g 1   traMADol (ULTRAM) 50 MG tablet Take 1 tablet (50 mg total) by mouth every 6 (six) hours as needed. 120 tablet 2   Current Facility-Administered Medications on File Prior to Visit  Medication Dose Route Frequency Provider Last Rate Last Admin   denosumab (PROLIA) injection 60 mg  60 mg Subcutaneous Q6 months Mileidy Atkin T, MD   60 mg at 11/22/21 1500     Allergies  Allergen Reactions   Pneumococcal Vaccines Other (See Comments)    Pt had localized reaction to injection of Pneumococcal 23 - Swelling and redness in upper arm that extend to but not beyond elbow   Social History   Socioeconomic History   Marital status: Widowed    Spouse name: Not on file   Number of children: 1   Years of education: Not on file   Highest education level: Not on file  Occupational History   Not on file  Tobacco Use   Smoking status: Never   Smokeless tobacco: Never  Vaping Use   Vaping Use: Never used  Substance and Sexual Activity   Alcohol use: Not Currently   Drug use: Never   Sexual activity: Not Currently    Comment: raised tobacco, retired  Other Topics Concern   Not on file  Social History Narrative   Lives alone   Right handed    Caffeine: 1/2-1 cup coffee in AM   Social Determinants of Health   Financial Resource Strain: Low Risk  (03/16/2021)   Overall Financial Resource Strain (CARDIA)    Difficulty of Paying Living Expenses: Not very hard  Food Insecurity: No Food Insecurity (03/16/2021)   Hunger Vital Sign    Worried About Running Out of Food in the Last Year: Never true    Ran Out of Food in the Last Year: Never true  Transportation Needs: No Transportation Needs (03/16/2021)   PRAPARE - Administrator, Civil Service (Medical): No    Lack of Transportation (Non-Medical): No  Physical Activity: Inactive (03/16/2021)   Exercise Vital Sign    Days of Exercise per Week: 0 days    Minutes of Exercise per Session: 0 min  Stress: No  Stress Concern Present (03/16/2021)   [employer] of Occupational Health - Occupational Stress Questionnaire    Feeling of Stress : Not at all  Social Connections: Moderately Integrated (03/16/2021)   Social Connection and Isolation Panel [NHANES]    Frequency of Communication with Friends and Family: More than three times a week    Frequency of Social Gatherings with Friends and Family: More than three times a week    Attends Religious Services: More than 4 times per year    Active Member of [employer] or Organizations: Yes    Attends Banker Meetings: More than 4 times per year    Marital Status: Widowed  Intimate Partner Violence: Not At Risk (03/16/2021)   Humiliation, Afraid, Rape, and Kick questionnaire    Fear of Current or Ex-Partner: No    Emotionally Abused: No    Physically Abused: No    Sexually Abused: No     Review of Systems  All other systems reviewed and are negative.      Objective:   Physical Exam Vitals reviewed.  Constitutional:      General: She is not in acute distress.    Appearance: She is well-developed and normal weight. She is not ill-appearing, toxic-appearing or diaphoretic.  HENT:     Right Ear: Tympanic membrane and ear canal normal.     Left Ear: Tympanic membrane and ear canal normal.     Mouth/Throat:     Mouth: Mucous membranes are dry.     Pharynx: No oropharyngeal exudate or posterior oropharyngeal erythema.  Eyes:     Extraocular Movements: Extraocular movements intact.     Conjunctiva/sclera: Conjunctivae normal.  Cardiovascular:     Rate and Rhythm: Regular rhythm. Tachycardia present.     Heart sounds: Normal heart sounds. No murmur heard.    No friction rub. No gallop.  Pulmonary:     Effort: Pulmonary effort is normal. No respiratory distress.     Breath sounds: Normal breath sounds. No stridor. No wheezing or rales.  Abdominal:     General: Bowel sounds are normal. There is no distension.     Palpations: Abdomen  is soft.     Tenderness: There is no abdominal tenderness. There is no guarding or rebound.  Musculoskeletal:     Right knee: Laceration present. No MCL or LCL tenderness.     Right lower leg: No edema.     Left lower leg: No edema.       Legs:  Lymphadenopathy:     Cervical: No cervical adenopathy.  Skin:    Findings: Lesion present.  No erythema.  Neurological:     General: No focal deficit present.     Mental Status: She is alert and oriented to person, place, and time.     Cranial Nerves: No cranial nerve deficit.     Motor: No weakness.     Coordination: Coordination normal.     Gait: Gait normal.     Deep Tendon Reflexes: Reflexes normal.  Psychiatric:        Attention and Perception: Attention and perception normal.        Mood and Affect: Mood is not elated. Affect is not labile, blunt, flat, angry or tearful.        Speech: Speech normal.        Behavior: Behavior normal. Behavior is not agitated, slowed, aggressive, withdrawn, hyperactive or combative. Behavior is cooperative.        Cognition and Memory: Cognition and memory normal.        Judgment: Judgment normal.         Assessment & Plan:  Noninfected skin tear of right lower extremity, subsequent encounter Wound on her right lower shin continues to improve.  I would estimate that the wound today is approximately 5 cm in diameter.  There is no evidence of any secondary infection.  The posterior wound has almost completely healed.  She has a wound clinic appointment in early December but I suspect that this wound is going to heal through secondary intention mom before that appointment.  Continue to apply Silvadene with nonadherent gauze to the wound and keep covered on a daily basis.  Recheck in 2 weeks if not improving or sooner if worsening.  I did refill the patient's Lexapro

## 2021-12-13 ENCOUNTER — Encounter: Payer: Self-pay | Admitting: Family Medicine

## 2021-12-13 ENCOUNTER — Ambulatory Visit (INDEPENDENT_AMBULATORY_CARE_PROVIDER_SITE_OTHER): Payer: Medicare Other | Admitting: Family Medicine

## 2021-12-13 VITALS — BP 130/60 | HR 78 | Temp 97.6°F | Ht 63.0 in | Wt 141.0 lb

## 2021-12-13 DIAGNOSIS — S51819A Laceration without foreign body of unspecified forearm, initial encounter: Secondary | ICD-10-CM | POA: Insufficient documentation

## 2021-12-13 NOTE — Assessment & Plan Note (Signed)
Skin tear cleansed with normal saline and re approximated using steri-strips, antibacterial ointment applied and covered with xeroform and non adherent gauze and wrapped with net wrap. Patient instructed to cleanse and reapply ointment, xeroform, gauze, and wrap daily. Return to office for signs of infection such as erythema, purulent drainage, and fever.

## 2021-12-13 NOTE — Progress Notes (Signed)
   Acute Office Visit  Subjective:     Patient ID: [name], female    DOB: April 05, 1931, 87 y.o.   MRN: [id number]  Chief Complaint  Patient presents with   Follow-up    skin tear on arm    HPI Patient is in today for skin tear to left forearm that occurred yesterday and was caused by her pocket sliding down her arm. Pain is 0/10. Has used neosporin and non-adherent dressing.  Review of Systems  Reason unable to perform ROS: see HPI.  All other systems reviewed and are negative.       Objective:    BP 130/60   Pulse 78   Temp 97.6 F (36.4 C) (Oral)   Ht 5\' 3"  (1.6 m)   Wt 141 lb (64 kg)   SpO2 98%   BMI 24.98 kg/m    Physical Exam Skin:         Comments: Skin tear to left forearm, 3 inches wide x 4 inches height, bloody drainage, no edema     No results found for any visits on 12/13/21.      Assessment & Plan:   Problem List Items Addressed This Visit       Musculoskeletal and Integument   Skin tear of forearm without complication, initial encounter - Primary    Skin tear cleansed with normal saline and re approximated using steri-strips, antibacterial ointment applied and covered with xeroform and non adherent gauze and wrapped with net wrap. Patient instructed to cleanse and reapply ointment, xeroform, gauze, and wrap daily. Return to office for signs of infection such as erythema, purulent drainage, and fever.        No orders of the defined types were placed in this encounter.   Return if symptoms worsen or fail to improve.  13/09/23, FNP

## 2021-12-25 ENCOUNTER — Telehealth: Payer: Self-pay

## 2021-12-25 NOTE — Telephone Encounter (Signed)
  Prescription Request  12/25/2021  Is this a "Controlled Substance" medicine? Yes  LOV: 12/13/2021   What is the name of the medication or equipment? ALPRAZolam (XANAX) 0.5 MG tablet [[id number]]   Have you contacted your pharmacy to request a refill? Yes   Which pharmacy would you like this sent to?  [facility] [address], [place] - [address] [place] [address] Phone: [phone] Fax: [phone]   Patient notified that their request is being sent to the clinical staff for review and that they should receive a response within 2 business days.   Please advise at [phone]

## 2021-12-26 ENCOUNTER — Other Ambulatory Visit: Payer: Self-pay | Admitting: Family Medicine

## 2021-12-26 DIAGNOSIS — F5101 Primary insomnia: Secondary | ICD-10-CM

## 2021-12-26 MED ORDER — ALPRAZOLAM 0.5 MG PO TABS: 0.5000 mg | ORAL_TABLET | Freq: Three times a day (TID) | ORAL | 0 refills | Status: DC | PRN

## 2021-12-26 NOTE — Progress Notes (Signed)
PDMP reviewed, will refill per Dr Tanya Nones

## 2022-01-07 ENCOUNTER — Encounter: Payer: Medicare Other | Attending: Physician Assistant | Admitting: Physician Assistant

## 2022-01-07 DIAGNOSIS — I872 Venous insufficiency (chronic) (peripheral): Secondary | ICD-10-CM | POA: Insufficient documentation

## 2022-01-07 DIAGNOSIS — G603 Idiopathic progressive neuropathy: Secondary | ICD-10-CM | POA: Diagnosis not present

## 2022-01-07 DIAGNOSIS — N183 Chronic kidney disease, stage 3 unspecified: Secondary | ICD-10-CM | POA: Insufficient documentation

## 2022-01-07 DIAGNOSIS — I87331 Chronic venous hypertension (idiopathic) with ulcer and inflammation of right lower extremity: Secondary | ICD-10-CM | POA: Diagnosis not present

## 2022-01-07 DIAGNOSIS — L97812 Non-pressure chronic ulcer of other part of right lower leg with fat layer exposed: Secondary | ICD-10-CM | POA: Insufficient documentation

## 2022-01-07 DIAGNOSIS — Z96642 Presence of left artificial hip joint: Secondary | ICD-10-CM | POA: Diagnosis not present

## 2022-01-07 DIAGNOSIS — I129 Hypertensive chronic kidney disease with stage 1 through stage 4 chronic kidney disease, or unspecified chronic kidney disease: Secondary | ICD-10-CM | POA: Insufficient documentation

## 2022-01-07 DIAGNOSIS — M25552 Pain in left hip: Secondary | ICD-10-CM | POA: Diagnosis not present

## 2022-01-09 ENCOUNTER — Ambulatory Visit: Payer: Self-pay

## 2022-01-09 ENCOUNTER — Ambulatory Visit (INDEPENDENT_AMBULATORY_CARE_PROVIDER_SITE_OTHER): Payer: Medicare Other | Admitting: Surgery

## 2022-01-09 DIAGNOSIS — S300XXA Contusion of lower back and pelvis, initial encounter: Secondary | ICD-10-CM

## 2022-01-09 DIAGNOSIS — M533 Sacrococcygeal disorders, not elsewhere classified: Secondary | ICD-10-CM | POA: Diagnosis not present

## 2022-01-09 DIAGNOSIS — M25552 Pain in left hip: Secondary | ICD-10-CM | POA: Diagnosis not present

## 2022-01-09 NOTE — Progress Notes (Signed)
[name] ([id number]) [id number] Page 1 of 9 Visit Report for 01/07/2022 Allergy List Details Patient Name: Date of Service: [name], [name] 01/07/2022 8:45 A M Medical Record Number: [id number] Patient Account Number: [id number] Date of Birth/Sex: Treating RN: [birth year] (88 y.o. [name] Primary Care Tascha Casares: [name] Other Clinician: Referring Elohim Brune: Treating Verner Mccrone/Extender: Wilma Flavin Weeks in Treatment: 0 Allergies Active Allergies pneumococcal vaccine Allergy Notes Electronic Signature(s) Signed: 01/09/2022 7:53:46 AM By: Yevonne Pax RN Entered By: Yevonne Pax on 01/07/2022 08:58:47 -------------------------------------------------------------------------------- Arrival Information Details Patient Name: Date of Service: [name] 01/07/2022 8:45 A M Medical Record Number: [id number] Patient Account Number: [id number] Date of Birth/Sex: Treating RN: 13-May-1931 (88 y.o. [name] Primary Care Shania Bjelland: [name] Other Clinician: Referring Gerri Acre: Treating Kobi Mario/Extender: Trula Ore in Treatment: 0 Visit Information Patient Arrived: [name] Arrival Time: 08:50 Accompanied By: self Transfer Assistance: None Patient Identification Verified: Yes Secondary Verification Process Completed: Yes Patient Requires Transmission-Based Precautions: No Patient Has Alerts: No Electronic Signature(s) Signed: 01/09/2022 7:53:46 AM By: Yevonne Pax RN Entered By: Yevonne Pax on 01/07/2022 08:55:44 [name] ([id number]) [id number] Page 2 of 9 -------------------------------------------------------------------------------- Clinic Level of Care Assessment Details Patient Name: Date of Service: [name] 01/07/2022 8:45 A M Medical Record Number: [id number] Patient Account Number: [id number] Date of Birth/Sex: Treating  RN: Jun 05, 1931 (88 y.o. [name] Primary Care Jacquese Cassarino: [name] Other Clinician: Referring Kristalynn Coddington: Treating Andrik Sandt/Extender: Trula Ore in Treatment: 0 Clinic Level of Care Assessment Items TOOL 1 Quantity Fox X- 1 0 Use when EandM and Procedure is performed on INITIAL visit ASSESSMENTS - Nursing Assessment / Reassessment X- 1 20 General Physical Exam (combine w/ comprehensive assessment (listed just below) when performed on new pt. evals) X- 1 25 Comprehensive Assessment (HX, ROS, Risk Assessments, Wounds Hx, etc.) ASSESSMENTS - Wound and Skin Assessment / Reassessment []  - 0 Dermatologic / Skin Assessment (not related to wound area) ASSESSMENTS - Ostomy and/or Continence Assessment and Care []  - 0 Incontinence Assessment and Management []  - 0 Ostomy Care Assessment and Management (repouching, etc.) PROCESS - Coordination of Care X - Simple Patient / Family Education for ongoing care 1 15 []  - 0 Complex (extensive) Patient / Family Education for ongoing care X- 1 10 Staff obtains , Records, T Results / Process Orders est []  - 0 Staff telephones HHA, Nursing Homes / Clarify orders / etc []  - 0 Routine Transfer to another Facility (non-emergent condition) []  - 0 Routine Hospital Admission (non-emergent condition) X- 1 15 New Admissions / / Ordering NPWT Apligraf, etc. , []  - 0 Emergency Hospital Admission (emergent condition) PROCESS - Special Needs []  - 0 Pediatric / Minor Patient Management []  - 0 Isolation Patient Management []  - 0 Hearing / Language / Visual special needs []  - 0 Assessment of Community assistance (transportation, D/C planning, etc.) []  - 0 Additional assistance / Altered mentation []  - 0 Support Surface(s) Assessment (bed, cushion, seat, etc.) INTERVENTIONS - Miscellaneous []  - 0 External ear exam []  - 0 Patient Transfer (multiple staff / / Similar  devices) []  - 0 Simple Staple / Suture removal (25 or less) []  - 0 Complex Staple / Suture removal (26 or more) []  - 0 Hypo/Hyperglycemic Management (do not check if billed separately) SHENIECE, RUGGLES (Chiropractor) [id number] Page 3 of 9 X- 1 15 Ankle / Brachial Index (ABI) - do not check if billed separately Has the patient  been seen at the hospital within the last three years: Yes Total Fox: 100 Level Of Care: New/Established - Level 3 Electronic Signature(s) Signed: 01/09/2022 7:53:46 AM By: Yevonne PaxEpps, [name] RN Entered By: Yevonne PaxEpps, [name] on 01/07/2022 09:40:16 -------------------------------------------------------------------------------- Encounter Discharge Information Details Patient Name: Date of Service: Colleen ScoreGERRINGER, [name] 01/07/2022 8:45 A M Medical Record Number: [id number] Patient Account Number: [id number] Date of Birth/Sex: Treating RN: 05/10/1931 (88 y.o. [name]) Epps, [name] Primary Care Sebastain Fishbaugh: Lynnea FerrierPickard, Warren Other Clinician: Referring Chrisha Vogel: Treating Zayin Valadez/Extender: Trula OreStone, Hoyt Pickard, Warren Weeks in Treatment: 0 Encounter Discharge Information Items Post Procedure Vitals Discharge Condition: Stable Temperature (F): 98 Ambulatory Status: Walker Pulse (bpm): 86 Discharge Destination: Home Respiratory Rate (breaths/min): 18 Transportation: Private Auto Blood Pressure (mmHg): 173/74 Accompanied By: self Schedule Follow-up Appointment: Yes Clinical Summary of Care: Electronic Signature(s) Signed: 01/09/2022 7:53:46 AM By: Yevonne PaxEpps, [name] RN Entered By: Yevonne PaxEpps, [name] on 01/07/2022 09:58:40 -------------------------------------------------------------------------------- Lower Extremity Assessment Details Patient Name: Date of Service: Colleen ScoreGERRINGER, [name] 01/07/2022 8:45 A M Medical Record Number: [id number] Patient Account Number: [id number] Date of Birth/Sex: Treating RN: 06/09/1931 (88 y.o. [name]) Epps, [name] Primary Care Kerron Sedano:  Lynnea FerrierPickard, Warren Other Clinician: Referring Dorien Mayotte: Treating Mertice Uffelman/Extender: Wilma FlavinStone, Hoyt Pickard, Warren Weeks in Treatment: 0 Edema Assessment Assessed: [Left: No] [Right: No] Edema: [Left: Ye] [Right: s] Calf Colleen ScoreGERRINGER, Jerie L ([id number]) [id number] Page 4 of 9 Left: Right: Point of Measurement: 32 cm From Medial Instep 38 cm Ankle Left: Right: Point of Measurement: 11 cm From Medial Instep 23.5 cm Knee To Floor Left: Right: From Medial Instep 42 cm Vascular Assessment Pulses: Dorsalis Pedis Palpable: [Right:Yes] Doppler Audible: [Right:Yes] Blood Pressure: Brachial: [Right:173] Ankle: [Right:Dorsalis Pedis: 150 0.87] Electronic Signature(s) Signed: 01/09/2022 7:53:46 AM By: Yevonne PaxEpps, [name] RN Entered By: Yevonne PaxEpps, [name] on 01/07/2022 09:14:54 -------------------------------------------------------------------------------- Multi Wound Chart Details Patient Name: Date of Service: Colleen ScoreGERRINGER, Shatana L. 01/07/2022 8:45 A M Medical Record Number: [id number] Patient Account Number: [id number] Date of Birth/Sex: Treating RN: 07/21/1931 (88 y.o. [name]) Epps, [name] Primary Care Kaylana Fenstermacher: Lynnea FerrierPickard, Warren Other Clinician: Referring Blaze Sandin: Treating Ennis Heavner/Extender: Trula OreStone, Hoyt Pickard, Warren Weeks in Treatment: 0 Vital Signs Height(in): 63 Pulse(bpm): 86 Weight(lbs): 140 Blood Pressure(mmHg): 173/74 Body Mass Index(BMI): 24.8 Temperature(F): 98 Respiratory Rate(breaths/min): 18 [1:Photos:] [N/A:N/A] Right, Lateral Lower Leg N/A N/A Wound Location: Gradually Appeared N/A N/A Wounding Event: Neuropathic Ulcer-Non Diabetic N/A N/A Primary Etiology: Hypertension N/A N/A Comorbid History: 10/05/2021 N/A N/A Date Acquired: 0 N/A N/A Weeks of Treatment: Colleen ScoreGERRINGER, Yarenis L ([id number]) [id number] Page 5 of 9 Open N/A N/A Wound Status: No N/A N/A Wound Recurrence: 5.7x3.5x0.2 N/A N/A Measurements L x W x D  (cm) 15.669 N/A N/A A (cm) : rea 3.134 N/A N/A Volume (cm) : Full Thickness Without Exposed N/A N/A Classification: Support Structures Medium N/A N/A Exudate Amount: Serosanguineous N/A N/A Exudate Type: red, brown N/A N/A Exudate Color: Medium (34-66%) N/A N/A Granulation Amount: Red N/A N/A Granulation Quality: Medium (34-66%) N/A N/A Necrotic Amount: Fascia: No N/A N/A Exposed Structures: Fat Layer (Subcutaneous Tissue): No Tendon: No Muscle: No Joint: No Bone: No Treatment Notes Electronic Signature(s) Signed: 01/09/2022 7:53:46 AM By: Yevonne PaxEpps, [name] RN Entered By: Yevonne PaxEpps, [name] on 01/07/2022 09:32:49 -------------------------------------------------------------------------------- Multi-Disciplinary Care Plan Details Patient Name: Date of Service: Colleen ScoreGERRINGER, Marquerite L. 01/07/2022 8:45 A M Medical Record Number: [id number] Patient Account Number: [id number] Date of Birth/Sex: Treating RN: 03/19/1931 (88 y.o. [name]) Epps, [name] Primary Care Karisha Marlin: Lynnea FerrierPickard, Warren Other Clinician: Referring Jadasia Haws: Treating Kayra Brooks/Extender: Trula OreStone, Hoyt Pickard, Warren Weeks in Treatment: 0 Active Inactive Abuse / Safety /  Falls / Self Care Management Nursing Diagnoses: Potential for injury related to falls Goals: Patient will remain injury free related to falls Date Initiated: 01/07/2022 Target Resolution Date: 02/07/2022 Goal Status: Active Interventions: Assess Activities of Daily Living upon admission and as needed Assess fall risk on admission and as needed Assess: immobility, friction, shearing, incontinence upon admission and as needed Assess impairment of mobility on admission and as needed per policy Assess personal safety and home safety (as indicated) on admission and as needed Assess self care needs on admission and as needed Notes: Wound/Skin Impairment Nursing Diagnoses: Knowledge deficit related to ulceration/compromised skin integrity ASHYLA, LUTH  ([id number]) [id number] Page 6 of 9 Goals: Patient/caregiver will verbalize understanding of skin care regimen Date Initiated: 01/07/2022 Target Resolution Date: 02/07/2022 Goal Status: Active Ulcer/skin breakdown will have a volume reduction of 30% by week 4 Date Initiated: 01/07/2022 Target Resolution Date: 02/07/2022 Goal Status: Active Ulcer/skin breakdown will have a volume reduction of 50% by week 8 Date Initiated: 01/07/2022 Target Resolution Date: 03/10/2022 Goal Status: Active Ulcer/skin breakdown will have a volume reduction of 80% by week 12 Date Initiated: 01/07/2022 Target Resolution Date: 04/08/2022 Goal Status: Active Ulcer/skin breakdown will heal within 14 weeks Date Initiated: 01/07/2022 Target Resolution Date: 05/10/2022 Goal Status: Active Interventions: Assess patient/caregiver ability to obtain necessary supplies Assess patient/caregiver ability to perform ulcer/skin care regimen upon admission and as needed Assess ulceration(s) every visit Notes: Electronic Signature(s) Signed: 01/09/2022 7:53:46 AM By: Yevonne Pax RN Entered By: Yevonne Pax on 01/07/2022 09:42:25 -------------------------------------------------------------------------------- Pain Assessment Details Patient Name: Date of Service: JULE, WHITSEL 01/07/2022 8:45 A M Medical Record Number: [id number] Patient Account Number: [id number] Date of Birth/Sex: Treating RN: 1931/08/18 (88 y.o. [name] Primary Care Khamille Beynon: [name] Other Clinician: Referring Micael Barb: Treating Pearlean Sabina/Extender: Wilma Flavin Weeks in Treatment: 0 Active Problems Location of Pain Severity and Description of Pain Patient Has Paino No Site Locations Pain Management and Medication ABIHA, LUKEHART ([id number]) [id number] Page 7 of 9 Current Pain Management: Electronic Signature(s) Signed: 01/09/2022 7:53:46 AM By: Yevonne Pax  RN Entered By: Yevonne Pax on 01/07/2022 08:56:01 -------------------------------------------------------------------------------- Patient/Caregiver Education Details Patient Name: Date of Service: [name] 12/4/2023andnbsp8:45 A M Medical Record Number: [id number] Patient Account Number: [id number] Date of Birth/Gender: Treating RN: July 13, 1931 (88 y.o. [name] Primary Care Physician: [name] Other Clinician: Referring Physician: Treating Physician/Extender: Trula Ore in Treatment: 0 Education Assessment Education Provided To: Patient Education Topics Provided Wound/Skin Impairment: Methods: Explain/Verbal Responses: State content correctly Electronic Signature(s) Signed: 01/09/2022 7:53:46 AM By: Yevonne Pax RN Entered By: Yevonne Pax on 01/07/2022 09:40:44 -------------------------------------------------------------------------------- Wound Assessment Details Patient Name: Date of Service: SHARONANN, MALBROUGH 01/07/2022 8:45 A M Medical Record Number: [id number] Patient Account Number: [id number] Date of Birth/Sex: Treating RN: 1931/10/10 (88 y.o. [name] Primary Care Geriann Lafont: [name] Other Clinician: Referring Phares Zaccone: Treating Iwalani Templeton/Extender: Wilma Flavin Weeks in Treatment: 0 Wound Status Wound Number: 1 Primary Etiology: Neuropathic Ulcer-Non Diabetic Wound Location: Right, Lateral Lower Leg Wound Status: Open Wounding Event: Gradually Appeared Comorbid History: Hypertension MILEIDY, ATKIN ([id number]) [id number] Page 8 of 9 Date Acquired: 10/05/2021 Weeks Of Treatment: 0 Clustered Wound: No Photos Wound Measurements Length: (cm) 5.7 Width: (cm) 3.5 Depth: (cm) 0.2 Area: (cm) 15.669 Volume: (cm) 3.134 % Reduction in Area: % Reduction in Volume: Tunneling: No Undermining: No Wound Description Classification: Full Thickness Without Exposed  Suppor Exudate Amount: Medium Exudate Type: Serosanguineous Exudate Color: red, brown  t Structures Foul Odor After Cleansing: No Slough/Fibrino Yes Wound Bed Granulation Amount: Medium (34-66%) Exposed Structure Granulation Quality: Red Fascia Exposed: No Necrotic Amount: Medium (34-66%) Fat Layer (Subcutaneous Tissue) Exposed: No Necrotic Quality: Adherent Slough Tendon Exposed: No Muscle Exposed: No Joint Exposed: No Bone Exposed: No Electronic Signature(s) Signed: 01/09/2022 7:53:46 AM By: Yevonne Pax RN Entered By: Yevonne Pax on 01/07/2022 09:17:40 -------------------------------------------------------------------------------- Vitals Details Patient Name: Date of Service: [name] 01/07/2022 8:45 A M Medical Record Number: [id number] Patient Account Number: [id number] Date of Birth/Sex: Treating RN: 29-Jun-1931 (88 y.o. [name] Primary Care Joann Jorge: [name] Other Clinician: Referring Alexus Michael: Treating Dennies Coate/Extender: Trula Ore in Treatment: 0 Vital Signs Time Taken: 08:56 Temperature (F): 98 Height (in): 63 Pulse (bpm): 86 Source: Stated Respiratory Rate (breaths/min): 18 Weight (lbs): 140 Blood Pressure (mmHg): 173/74 Source: Stated Reference Range: 80 - 120 mg / dl BERKLEY, WRIGHTSMAN ([id number]) [id number] Page 9 of 9 Body Mass Index (BMI): 24.8 Electronic Signature(s) Signed: 01/09/2022 7:53:46 AM By: Yevonne Pax RN Entered By: Yevonne Pax on 01/07/2022 08:57:11

## 2022-01-09 NOTE — Progress Notes (Signed)
Office Visit Note   Patient: [name]           Date of Birth: 12-28-1931           MRN: [id number] Visit Date: 01/09/2022              Requested by: Donita Brooks, MD [address] [place],  [place] [address] PCP: Donita Brooks, MD   Assessment & Plan: Visit Diagnoses:  1. Pain in left hip   2. Coccyx pain   3. Contusion of buttock, initial encounter     Plan: advised patient that she should start improving over the next couple of weeks.  Can f/u with dr Ophelia Charter in two weeks for recheck but she would like to let us know how she is feeling and schedule appointment if needed.   Follow-Up Instructions: No follow-ups on file.   Orders:  Orders Placed This Encounter  Procedures  . XR HIP UNILAT W OR W/O PELVIS 2-3 VIEWS LEFT  . XR Sacrum/Coccyx   No orders of the defined types were placed in this encounter.     Procedures: No procedures performed   Clinical Data: No additional findings.   Subjective: Chief Complaint  Patient presents with  . Left Hip - Pain    HPI  Review of Systems   Objective: Vital Signs: There were no vitals taken for this visit.  Physical Exam  Ortho Exam  Specialty Comments:  No specialty comments available.  Imaging: No results found.   PMFS History: Patient Active Problem List   Diagnosis Date Noted  . Skin tear of forearm without complication, initial encounter 12/13/2021  . Left foot drop 02/05/2021  . Pain of left lower leg 01/17/2021  . Strain of calf muscle 01/17/2021  . Bilateral wrist pain 05/11/2020  . Body mass index (BMI) 27.0-27.9, adult 05/04/2019  . Thoracic spondylosis with myelopathy 05/04/2019  . Proximal weakness of extremity 02/25/2019  . CKD (chronic kidney disease) stage 3, GFR 30-59 ml/min (HCC)   . Unilateral primary osteoarthritis, left knee 09/16/2018  . Thoracic compression fracture, sequela 09/16/2018  . Hx of total hip arthroplasty, bilateral 09/16/2018  . Bilateral  primary osteoarthritis of knee 08/04/2018  . Essential hypertension 07/08/2018  . Osteoarthritis of right knee 05/25/2018  . Knee pain, right 12/08/2017  . Osteopenia 09/13/2014  . Fatty liver disease, nonalcoholic    Past Medical History:  Diagnosis Date  . Cataract   . CKD (chronic kidney disease) stage 3, GFR 30-59 ml/min (HCC)   . Fatty liver disease, nonalcoholic    2007- admitted with encephalopathy unsure of cause, resolved sponatneously  . Hypertension     Family History  Problem Relation Age of Onset  . Heart disease Mother   . Cancer Father        metastatic unsure of primary  . Early death Brother        died at 43 y/o  . Liver cancer Maternal Grandmother   . Healthy Son     Past Surgical History:  Procedure Laterality Date  . FRACTURE SURGERY     right shoulder, both wrists   . JOINT REPLACEMENT     HIP- bilateral   Social History   Occupational History  . Not on file  Tobacco Use  . Smoking status: Never  . Smokeless tobacco: Never  Vaping Use  . Vaping Use: Never used  Substance and Sexual Activity  . Alcohol use: Not Currently  . Drug  use: Never  . Sexual activity: Not Currently    Comment: raised tobacco, retired

## 2022-01-09 NOTE — Progress Notes (Signed)
VERNEL, DONLAN ([id number]) [phone] [id number] Page 1 of 5 Visit Report for 01/07/2022 Abuse Risk Screen Details Patient Name: Date of Service: [name], [name] 01/07/2022 8:45 A M Medical Record Number: [id number] Patient Account Number: [id number] Date of Birth/Sex: Treating RN: 22-Sep-1931 (88 y.o. [name] Primary Care Ryiah Bellissimo: [name] Other Clinician: Referring Rina Adney: Treating Nur Rabold/Extender: Trula Ore in Treatment: 0 Abuse Risk Screen Items Answer ABUSE RISK SCREEN: Has anyone close to you tried to hurt or harm you recentlyo No Do you feel uncomfortable with anyone in your familyo No Has anyone forced you do things that you didnt want to doo No Electronic Signature(s) Signed: 01/09/2022 7:53:46 AM By: Yevonne Pax RN Entered By: Yevonne Pax on 01/07/2022 09:01:35 -------------------------------------------------------------------------------- Activities of Daily Living Details Patient Name: Date of Service: [name], [name] 01/07/2022 8:45 A M Medical Record Number: [id number] Patient Account Number: [id number] Date of Birth/Sex: Treating RN: November 04, 1931 (88 y.o. [name] Primary Care Mohini Heathcock: [name] Other Clinician: Referring Khrystian Schauf: Treating Manilla Strieter/Extender: Trula Ore in Treatment: 0 Activities of Daily Living Items Answer Activities of Daily Living (Please select one for each item) Drive Automobile Completely Able T Medications ake Completely Able Use T elephone Completely Able Care for Appearance Completely Able Use T oilet Completely Able Bath / Shower Completely Able Dress Self Completely Able Feed Self Completely Able Walk Completely Able Get In / Out Bed Completely Able Housework Completely [name], [name] ([id number]) [phone] [id number] Page 2 of 5 Prepare Meals Completely Able Handle Money Completely  Able Shop for Self Completely Able Electronic Signature(s) Signed: 01/09/2022 7:53:46 AM By: Yevonne Pax RN Entered By: Yevonne Pax on 01/07/2022 09:02:01 -------------------------------------------------------------------------------- Education Screening Details Patient Name: Date of Service: [name] 01/07/2022 8:45 A M Medical Record Number: [id number] Patient Account Number: [id number] Date of Birth/Sex: Treating RN: 01/16/1932 (88 y.o. [name] Primary Care Casara Perrier: [name] Other Clinician: Referring Remy Dia: Treating Lemon Sternberg/Extender: Trula Ore in Treatment: 0 Primary Learner Assessed: Patient Learning Preferences/Education Level/Primary Language Learning Preference: Explanation Highest Education Level: High School Preferred Language: English Cognitive Barrier Language Barrier: No Translator Needed: No Memory Deficit: No Emotional Barrier: No Cultural/Religious Beliefs Affecting Medical Care: No Physical Barrier Impaired Vision: Yes Glasses Impaired Hearing: Yes Hearing Aid Decreased Hand dexterity: No Knowledge/Comprehension Knowledge Level: Medium Comprehension Level: High Ability to understand written instructions: High Ability to understand verbal instructions: High Motivation Anxiety Level: Anxious Cooperation: Cooperative Education Importance: Acknowledges Need Interest in Health Problems: Asks Questions Perception: Coherent Willingness to Engage in Self-Management High Activities: Readiness to Engage in Self-Management High Activities: Electronic Signature(s) Signed: 01/09/2022 7:53:46 AM By: Yevonne Pax RN Entered By: Yevonne Pax on 01/07/2022 09:02:57 [name] ([id number]) [id number] Page 3 of 5 -------------------------------------------------------------------------------- Fall Risk Assessment Details Patient Name: Date of Service: [name], [name]  01/07/2022 8:45 A M Medical Record Number: [id number] Patient Account Number: [id number] Date of Birth/Sex: Treating RN: 11/21/1931 (88 y.o. [name] Primary Care Shulamis Wenberg: [name] Other Clinician: Referring Yannet Rincon: Treating Yarimar Lavis/Extender: Trula Ore in Treatment: 0 Fall Risk Assessment Items Have you had 2 or more falls in the last 12 monthso 0 No Have you had any fall that resulted in injury in the last 12 monthso 0 No FALLS RISK SCREEN History of falling - immediate or within 3 months 25 Yes Secondary diagnosis (Do you have 2 or more medical diagnoseso) 15 Yes Ambulatory aid None/bed rest/wheelchair/nurse 0 No  Crutches/cane/walker 15 Yes Furniture 0 No Intravenous therapy Access/Saline/Heparin Lock 0 No Gait/Transferring Normal/ bed rest/ wheelchair 0 No Weak (short steps with or without shuffle, stooped but able to lift head while walking, may seek 10 Yes support from furniture) Impaired (short steps with shuffle, may have difficulty arising from chair, head down, impaired 0 No balance) Mental Status Oriented to own ability 0 Yes Electronic Signature(s) Signed: 01/09/2022 7:53:46 AM By: Yevonne Pax RN Entered By: Yevonne Pax on 01/07/2022 09:03:36 -------------------------------------------------------------------------------- Foot Assessment Details Patient Name: Date of Service: [name] 01/07/2022 8:45 A M Medical Record Number: [id number] Patient Account Number: [id number] Date of Birth/Sex: Treating RN: 1931/12/01 (88 y.o. [name] Primary Care Menachem Urbanek: [name] Other Clinician: Referring Maybelline Kolarik: Treating Liliani Bobo/Extender: Trula Ore in Treatment: 0 Foot Assessment Items Site Locations [place], [place] ([id number]) [phone] [id number] Page 4 of 5 + = Sensation present, - = Sensation absent, C = Callus, U = Ulcer R = Redness, W = Warmth, M =  Maceration, PU = Pre-ulcerative lesion F = Fissure, S = Swelling, D = Dryness Assessment Right: Left: Other Deformity: No No Prior Foot Ulcer: No No Prior Amputation: No No Charcot Joint: No No Ambulatory Status: Ambulatory Without Help Gait: Steady Electronic Signature(s) Signed: 01/09/2022 7:53:46 AM By: Yevonne Pax RN Entered By: Yevonne Pax on 01/07/2022 09:13:07 -------------------------------------------------------------------------------- Nutrition Risk Screening Details Patient Name: Date of Service: [name], [name] 01/07/2022 8:45 A M Medical Record Number: [id number] Patient Account Number: [id number] Date of Birth/Sex: Treating RN: 04-11-1931 (88 y.o. [name] Primary Care Aryanna Shaver: [name] Other Clinician: Referring Ulus Hazen: Treating Johnney Scarlata/Extender: Trula Ore in Treatment: 0 Height (in): 63 Weight (lbs): 140 Body Mass Index (BMI): 24.8 Nutrition Risk Screening Items Fox Screening NUTRITION RISK SCREEN: I have an illness or condition that made me change the kind and/or amount of food I eat 0 No I eat fewer than two meals per day 0 No I eat few fruits and vegetables, or milk products 0 No I have three or more drinks of beer, liquor or wine almost every day 0 No I have tooth or mouth problems that make it hard for me to eat 0 No I don't always have enough money to buy the food I need 0 No [name], [name] ([id number]) [id number] Page 5 of 5 I eat alone most of the time 0 No I take three or more different prescribed or over-the-counter drugs a day 1 Yes Without wanting to, I have lost or gained 10 pounds in the last six months 0 No I am not always physically able to shop, cook and/or feed myself 0 No Nutrition Protocols Good Risk Protocol 0 No interventions needed Moderate Risk Protocol High Risk Proctocol Risk Level: Good Risk Fox: 1 Electronic Signature(s) Signed: 01/09/2022  7:53:46 AM By: Yevonne Pax RN Entered By: Yevonne Pax on 01/07/2022 09:03:53

## 2022-01-09 NOTE — Progress Notes (Signed)
JAELLA, WEINERT ([id number]) [id number] Page 1 of 9 Visit Report for 01/07/2022 Chief Complaint Document Details Patient Name: Date of Service: [name], [name] 01/07/2022 8:45 A M Medical Record Number: [id number] Patient Account Number: [id number] Date of Birth/Sex: Treating RN: 1931-11-13 (88 y.o. [name] Primary Care Provider: [name] Other Clinician: Referring Provider: Treating Provider/Extender: Trula Ore in Treatment: 0 Information Obtained from: Patient Chief Complaint Right LE Ulcer Electronic Signature(s) Signed: 01/07/2022 9:24:48 AM By: Lenda Kelp PA-C Entered By: Lenda Kelp on 01/07/2022 09:24:47 -------------------------------------------------------------------------------- Debridement Details Patient Name: Date of Service: [name] 01/07/2022 8:45 A M Medical Record Number: [id number] Patient Account Number: [id number] Date of Birth/Sex: Treating RN: 03-18-1931 (88 y.o. [name] Primary Care Provider: [name] Other Clinician: Referring Provider: Treating Provider/Extender: Wilma Flavin Weeks in Treatment: 0 Debridement Performed for Assessment: Wound #1 Right,Lateral Lower Leg Performed By: Physician Nelida Meuse., PA-C Debridement Type: Debridement Severity of Tissue Pre Debridement: Fat layer exposed Level of Consciousness (Pre-procedure): Awake and Alert Pre-procedure Verification/Time Out Yes - 09:35 Taken: Start Time: 09:35 Pain Control: Lidocaine 4% T opical Solution T Area Debrided (L x W): otal 5.7 (cm) x 3.5 (cm) = 19.95 (cm) Tissue and other material debrided: Viable, Non-Viable, Slough, Subcutaneous, Skin: Dermis , Skin: Epidermis, Biofilm, Slough Level: Skin/Subcutaneous Tissue Debridement Description: Excisional Instrument: Curette Bleeding: Moderate Hemostasis Achieved: Pressure End Time: 09:40 Procedural Pain: 0 Post  Procedural Pain: 0 NORRINE, BALLESTER ([id number]) [id number] Page 2 of 9 Response to Treatment: Procedure was tolerated well Level of Consciousness (Post- Awake and Alert procedure): Post Debridement Measurements of Total Wound Length: (cm) 5.7 Width: (cm) 3.5 Depth: (cm) 0.2 Volume: (cm) 3.134 Character of Wound/Ulcer Post Debridement: Improved Severity of Tissue Post Debridement: Fat layer exposed Post Procedure Diagnosis Same as Pre-procedure Electronic Signature(s) Signed: 01/07/2022 4:48:10 PM By: Lenda Kelp PA-C Signed: 01/09/2022 7:53:46 AM By: Yevonne Pax RN Entered By: Yevonne Pax on 01/07/2022 09:38:49 -------------------------------------------------------------------------------- HPI Details Patient Name: Date of Service: [name] 01/07/2022 8:45 A M Medical Record Number: [id number] Patient Account Number: [id number] Date of Birth/Sex: Treating RN: March 11, 1931 (88 y.o. [name] Primary Care Provider: [name] Other Clinician: Referring Provider: Treating Provider/Extender: Trula Ore in Treatment: 0 History of Present Illness HPI Description: 01-07-2022 upon evaluation today patient appears to be doing poorly in regard to the wound on the right lateral lower extremity. She tells me that this occurred after a fall that she sustained on and around Labor Day weekend. Subsequently following this fall she tells me that she had a wound that really has not improved significantly. She has been seeing her primary care provider and they have been attempting to do what they could to get this improving overall. With that being said the patient has been on Silvadene cream, Neosporin, and more recently soaking with peroxide then wet to dry gauze recommended by primary care provider. Lastly Santyl was advised but they have not been able to get that filled as of yet. Patient does have a history of  neuropathy, chronic kidney disease stage III, and hypertension along with chronic venous insufficiency otherwise she is fairly healthy and has nothing else that should affect her ability to heal. Electronic Signature(s) Signed: 01/07/2022 10:22:17 AM By: Lenda Kelp PA-C Entered By: Lenda Kelp on 01/07/2022 10:22:17 Physical Exam Details -------------------------------------------------------------------------------- [name] ([id number]) [id number] Page 3 of 9 Patient Name: Date of Service: Fox,  [name] 01/07/2022 8:45 A M Medical Record Number: [id number] Patient Account Number: [id number] Date of Birth/Sex: Treating RN: [birth year] (88 y.o. [name] Primary Care Provider: [name] Other Clinician: Referring Provider: Treating Provider/Extender: Trula Ore in Treatment: 0 Constitutional patient is hypertensive.. pulse regular and within target range for patient.Marland Kitchen respirations regular, non-labored and within target range for patient.Marland Kitchen temperature within target range for patient.. Well-nourished and well-hydrated in no acute distress. Eyes conjunctiva clear no eyelid edema noted. pupils equal round and reactive to light and accommodation. Ears, Nose, Mouth, and Throat no gross abnormality of ear auricles or external auditory canals. normal hearing noted during conversation. mucus membranes moist. Respiratory normal breathing without difficulty. Cardiovascular 2+ dorsalis pedis/posterior tibialis pulses. no clubbing, cyanosis, significant edema, <3 sec cap refill. Musculoskeletal normal gait and posture. Psychiatric this patient is able to make decisions and demonstrates good insight into disease process. Alert and Oriented x 3. pleasant and cooperative. Notes Upon inspection patient's wound bed actually did require sharp debridement to clearway necrotic debris today. She tolerated the debridement  without complication and postdebridement wound bed appears to be doing significantly better which is great news. Electronic Signature(s) Signed: 01/07/2022 10:23:16 AM By: Lenda Kelp PA-C Entered By: Lenda Kelp on 01/07/2022 10:23:16 -------------------------------------------------------------------------------- Physician Orders Details Patient Name: Date of Service: FREDERICA, CHRESTMAN 01/07/2022 8:45 A M Medical Record Number: [id number] Patient Account Number: [id number] Date of Birth/Sex: Treating RN: 1931-04-27 (88 y.o. [name] Primary Care Provider: [name] Other Clinician: Referring Provider: Treating Provider/Extender: Trula Ore in Treatment: 0 Verbal / Phone Orders: No Diagnosis Coding ICD-10 Coding Code Description I87.331 Chronic venous hypertension (idiopathic) with ulcer and inflammation of right lower extremity G60.3 Idiopathic progressive neuropathy L97.812 Non-pressure chronic ulcer of other part of right lower leg with fat layer exposed N18.30 Chronic kidney disease, stage 3 unspecified I10 Essential (primary) hypertension Follow-up Appointments Return Appointment in 1 week. KEVIONNA, HEFFLER ([id number]) [id number] Page 4 of 9 Bathing/ Shower/ Hygiene May shower with wound dressing protected with water repellent cover or cast protector. No tub bath. Anesthetic (Use 'Patient Medications' Section for Anesthetic Order Entry) Lidocaine applied to wound bed Edema Control - Lymphedema / Segmental Compressive Device / Other Optional: One layer of unna paste to top of compression wrap (to act as an anchor). Elevate, Exercise Daily and A void Standing for Long Periods of Time. Elevate legs to the level of the heart and pump ankles as often as possible Elevate leg(s) parallel to the floor when sitting. Wound Treatment Wound #1 - Lower Leg Wound Laterality: Right, Lateral Cleanser: Wound  Cleanser (Generic) 1 x Per Week/30 Days Discharge Instructions: Wash your hands with soap and water. Remove old dressing, discard into plastic bag and place into trash. Cleanse the wound with Wound Cleanser prior to applying a clean dressing using gauze sponges, not tissues or cotton balls. Do not scrub or use excessive force. Pat dry using gauze sponges, not tissue or cotton balls. Prim Dressing: Hydrofera Blue Ready Transfer Foam, 2.5x2.5 (in/in) 1 x Per Week/30 Days ary Discharge Instructions: Apply Hydrofera Blue Ready to wound bed as directed Secondary Dressing: ABD Pad 5x9 (in/in) 1 x Per Week/30 Days Discharge Instructions: Cover with ABD pad Compression Wrap: 3-LAYER WRAP - Profore Lite LF 3 Multilayer Compression Bandaging System 1 x Per Week/30 Days Discharge Instructions: Apply 3 multi-layer wrap as prescribed. Electronic Signature(s) Signed: 01/07/2022 4:48:10 PM By: Lenda Kelp PA-C Signed: 01/09/2022 7:53:46  AM By: Yevonne Pax RN Entered By: Yevonne Pax on 01/07/2022 09:43:59 -------------------------------------------------------------------------------- Problem List Details Patient Name: Date of Service: RAYETTE, MOGG 01/07/2022 8:45 A M Medical Record Number: [id number] Patient Account Number: [id number] Date of Birth/Sex: Treating RN: [birth year] (88 y.o. [name] Primary Care Provider: [name] Other Clinician: Referring Provider: Treating Provider/Extender: Trula Ore in Treatment: 0 Active Problems ICD-10 Encounter Code Description Active Date MDM Diagnosis I87.331 Chronic venous hypertension (idiopathic) with ulcer and inflammation of right 01/07/2022 No Yes lower extremity G60.3 Idiopathic progressive neuropathy 01/07/2022 No Yes L97.812 Non-pressure chronic ulcer of other part of right lower leg with fat layer 01/07/2022 No Yes exposed RHEALYN, CULLEN ([id number]) [id number] Page 5 of  9 N18.30 Chronic kidney disease, stage 3 unspecified 01/07/2022 No Yes I10 Essential (primary) hypertension 01/07/2022 No Yes Inactive Problems Resolved Problems Electronic Signature(s) Signed: 01/07/2022 9:25:56 AM By: Lenda Kelp PA-C Previous Signature: 01/07/2022 9:24:32 AM Version By: Lenda Kelp PA-C Entered By: Lenda Kelp on 01/07/2022 09:25:55 -------------------------------------------------------------------------------- Progress Note Details Patient Name: Date of Service: [name] 01/07/2022 8:45 A M Medical Record Number: [id number] Patient Account Number: [id number] Date of Birth/Sex: Treating RN: 11-30-1931 (88 y.o. [name] Primary Care Provider: [name] Other Clinician: Referring Provider: Treating Provider/Extender: Trula Ore in Treatment: 0 Subjective Chief Complaint Information obtained from Patient Right LE Ulcer History of Present Illness (HPI) 01-07-2022 upon evaluation today patient appears to be doing poorly in regard to the wound on the right lateral lower extremity. She tells me that this occurred after a fall that she sustained on and around Labor Day weekend. Subsequently following this fall she tells me that she had a wound that really has not improved significantly. She has been seeing her primary care provider and they have been attempting to do what they could to get this improving overall. With that being said the patient has been on Silvadene cream, Neosporin, and more recently soaking with peroxide then wet to dry gauze recommended by primary care provider. Lastly Santyl was advised but they have not been able to get that filled as of yet. Patient does have a history of neuropathy, chronic kidney disease stage III, and hypertension along with chronic venous insufficiency otherwise she is fairly healthy and has nothing else that should affect her ability to heal. Patient  History Allergies pneumococcal vaccine Social History Never smoker, Marital Status - Widowed, Alcohol Use - Never, Drug Use - No History, Caffeine Use - Daily. Medical History Cardiovascular Patient has history of Hypertension Review of Systems (ROS) Eyes Complains or has symptoms of Glasses / Contacts. Genitourinary CKD 3 Integumentary (Skin) Complains or has symptoms of Wounds. SHAKIMA, NISLEY ([id number]) [id number] Page 6 of 9 Objective Constitutional patient is hypertensive.. pulse regular and within target range for patient.Marland Kitchen respirations regular, non-labored and within target range for patient.Marland Kitchen temperature within target range for patient.. Well-nourished and well-hydrated in no acute distress. Vitals Time Taken: 8:56 AM, Height: 63 in, Source: Stated, Weight: 140 lbs, Source: Stated, BMI: 24.8, Temperature: 98 F, Pulse: 86 bpm, Respiratory Rate: 18 breaths/min, Blood Pressure: 173/74 mmHg. Eyes conjunctiva clear no eyelid edema noted. pupils equal round and reactive to light and accommodation. Ears, Nose, Mouth, and Throat no gross abnormality of ear auricles or external auditory canals. normal hearing noted during conversation. mucus membranes moist. Respiratory normal breathing without difficulty. Cardiovascular 2+ dorsalis pedis/posterior tibialis pulses. no clubbing, cyanosis, significant edema, Musculoskeletal normal gait and  posture. Psychiatric this patient is able to make decisions and demonstrates good insight into disease process. Alert and Oriented x 3. pleasant and cooperative. General Notes: Upon inspection patient's wound bed actually did require sharp debridement to clearway necrotic debris today. She tolerated the debridement without complication and postdebridement wound bed appears to be doing significantly better which is great news. Integumentary (Hair, Skin) Wound #1 status is Open. Original cause of wound was  Gradually Appeared. The date acquired was: 10/05/2021. The wound is located on the Right,Lateral Lower Leg. The wound measures 5.7cm length x 3.5cm width x 0.2cm depth; 15.669cm^2 area and 3.134cm^3 volume. There is no tunneling or undermining noted. There is a medium amount of serosanguineous drainage noted. There is medium (34-66%) red granulation within the wound bed. There is a medium (34-66%) amount of necrotic tissue within the wound bed including Adherent Slough. Assessment Active Problems ICD-10 Chronic venous hypertension (idiopathic) with ulcer and inflammation of right lower extremity Idiopathic progressive neuropathy Non-pressure chronic ulcer of other part of right lower leg with fat layer exposed Chronic kidney disease, stage 3 unspecified Essential (primary) hypertension Procedures Wound #1 Pre-procedure diagnosis of Wound #1 is a Venous Leg Ulcer located on the Right,Lateral Lower Leg .Severity of Tissue Pre Debridement is: Fat layer exposed. There was a Excisional Skin/Subcutaneous Tissue Debridement with a total area of 19.95 sq cm performed by Nelida MeuseStone, Katlin Bortner E., PA-C. With the following instrument(s): Curette to remove Viable and Non-Viable tissue/material. Material removed includes Subcutaneous Tissue, Slough, Skin: Dermis, Skin: Epidermis, and Biofilm after achieving pain control using Lidocaine 4% Topical Solution. No specimens were taken. A time out was conducted at 09:35, prior to the start of the procedure. A Moderate amount of bleeding was controlled with Pressure. The procedure was tolerated well with a pain level of 0 throughout and a pain level of 0 following the procedure. Post Debridement Measurements: 5.7cm length x 3.5cm width x 0.2cm depth; 3.134cm^3 volume. Character of Wound/Ulcer Post Debridement is improved. Severity of Tissue Post Debridement is: Fat layer exposed. Post procedure Diagnosis Wound #1: Same as Pre-Procedure Plan Follow-up Appointments: Return  Appointment in 1 week. Bathing/ Shower/ Hygiene: May shower with wound dressing protected with water repellent cover or cast protector. No tub bath. Colleen ScoreGERRINGER, Dreana L ([id number]) [id number] Page 7 of 9 Anesthetic (Use 'Patient Medications' Section for Anesthetic Order Entry): Lidocaine applied to wound bed Edema Control - Lymphedema / Segmental Compressive Device / Other: Optional: One layer of unna paste to top of compression wrap (to act as an anchor). Elevate, Exercise Daily and Avoid Standing for Long Periods of Time. Elevate legs to the level of the heart and pump ankles as often as possible Elevate leg(s) parallel to the floor when sitting. WOUND #1: - Lower Leg Wound Laterality: Right, Lateral Cleanser: Wound Cleanser (Generic) 1 x Per Week/30 Days Discharge Instructions: Wash your hands with soap and water. Remove old dressing, discard into plastic bag and place into trash. Cleanse the wound with Wound Cleanser prior to applying a clean dressing using gauze sponges, not tissues or cotton balls. Do not scrub or use excessive force. Pat dry using gauze sponges, not tissue or cotton balls. Prim Dressing: Hydrofera Blue Ready Transfer Foam, 2.5x2.5 (in/in) 1 x Per Week/30 Days ary Discharge Instructions: Apply Hydrofera Blue Ready to wound bed as directed Secondary Dressing: ABD Pad 5x9 (in/in) 1 x Per Week/30 Days Discharge Instructions: Cover with ABD pad Com pression Wrap: 3-LAYER WRAP - Profore Lite LF 3 Multilayer Compression Bandaging System  1 x Per Week/30 Days Discharge Instructions: Apply 3 multi-layer wrap as prescribed. 1. Based on what I see I do feel like the patient would benefit from initiation of treatment with a Hydrofera Blue dressing. I think this is probably can to be one of the best ways to go. I think she can have quite a bit of drainage once we get the compression wrap on as this can I get more of this out of her system. 2. I am also  can recommend based on what I am seeing currently that the patient continue to monitor for any signs of worsening or infection. Obviously with the compression wrap in place I think that this should help keep the edema down. 3. We will go using Hydrofera Blue which I think should do a good job if we need to make any changes next week we can do so. We will see patient back for reevaluation in 1 week here in the clinic. If anything worsens or changes patient will contact our office for additional recommendations. Electronic Signature(s) Signed: 01/07/2022 10:26:25 AM By: Lenda Kelp PA-C Entered By: Lenda Kelp on 01/07/2022 10:26:25 -------------------------------------------------------------------------------- ROS/PFSH Details Patient Name: Date of Service: RUTA, CAPECE 01/07/2022 8:45 A M Medical Record Number: [id number] Patient Account Number: [id number] Date of Birth/Sex: Treating RN: 02-11-1931 (88 y.o. [name] Primary Care Provider: [name] Other Clinician: Referring Provider: Treating Provider/Extender: Wilma Flavin Weeks in Treatment: 0 Eyes Complaints and Symptoms: Positive for: Glasses / Contacts Integumentary (Skin) Complaints and Symptoms: Positive for: Wounds Cardiovascular Medical History: Positive for: Hypertension Genitourinary Complaints and Symptoms: Review of System Notes: CKD 3 Immunizations MALIYA, MARICH ([id number]) [id number] Page 8 of 9 Pneumococcal Vaccine: Received Pneumococcal Vaccination: Yes Received Pneumococcal Vaccination On or After 60th Birthday: No Implantable Devices None Family and Social History Never smoker; Marital Status - Widowed; Alcohol Use: Never; Drug Use: No History; Caffeine Use: Daily Electronic Signature(s) Signed: 01/07/2022 4:48:10 PM By: Lenda Kelp PA-C Signed: 01/09/2022 7:53:46 AM By: Yevonne Pax RN Entered By: Yevonne Pax on 01/07/2022  09:01:29 -------------------------------------------------------------------------------- SuperBill Details Patient Name: Date of Service: [name] 01/07/2022 Medical Record Number: [id number] Patient Account Number: [id number] Date of Birth/Sex: Treating RN: 1931/09/04 (88 y.o. [name] Primary Care Provider: [name] Other Clinician: Referring Provider: Treating Provider/Extender: Trula Ore in Treatment: 0 Diagnosis Coding ICD-10 Codes Code Description [phone] Chronic venous hypertension (idiopathic) with ulcer and inflammation of right lower extremity G60.3 Idiopathic progressive neuropathy L97.812 Non-pressure chronic ulcer of other part of right lower leg with fat layer exposed N18.30 Chronic kidney disease, stage 3 unspecified I10 Essential (primary) hypertension Facility Procedures : CPT4 Code: 46962952 Description: 99213 - WOUND CARE VISIT-LEV 3 EST PT Modifier: Quantity: 1 : CPT4 Code: [id number] Description: 11042 - DEB SUBQ TISSUE 20 SQ CM/< ICD-10 Diagnosis Description L97.812 Non-pressure chronic ulcer of other part of right lower leg with fat layer expos Modifier: ed Quantity: 1 Physician Procedures : CPT4 Code Description Modifier [id number] WC PHYS LEVEL 3 NEW PT 25 ICD-10 Diagnosis Description I87.331 Chronic venous hypertension (idiopathic) with ulcer and inflammation of right lower extremity G60.3 Idiopathic progressive neuropathy L97.812  Non-pressure chronic ulcer of other part of right lower leg with fat layer exposed N18.30 Chronic kidney disease, stage 3 unspecified Quantity: 1 : [id number] 11042 - WC PHYS SUBQ TISS 20 SQ CM ICD-10 Diagnosis Description L97.812 Non-pressure chronic ulcer of other part of right lower leg with fat layer exposed  ABBRIELLE, BATTS ([id number]) [id number] Quantity: 1 df Page 9 of 9 Electronic Signature(s) Signed: 01/07/2022 10:32:13 AM By: Lenda Kelp  PA-C Entered By: Lenda Kelp on 01/07/2022 10:32:13

## 2022-01-10 ENCOUNTER — Other Ambulatory Visit: Payer: Self-pay | Admitting: Family Medicine

## 2022-01-10 DIAGNOSIS — F5101 Primary insomnia: Secondary | ICD-10-CM

## 2022-01-10 NOTE — Telephone Encounter (Signed)
Requested medications are due for refill today.  NO  Requested medications are on the active medications list.  no  Last refill.   Future visit scheduled.   no  Notes to clinic.  Medication refill or refusal is not delegated. Med was d/c'd 12/13/2021    Requested Prescriptions  Pending Prescriptions Disp Refills   predniSONE (DELTASONE) 10 MG tablet [Pharmacy Med Name: predniSONE 10 MG Oral Tablet] 90 tablet 0    Sig: Take 1 tablet by mouth once daily with breakfast     Not Delegated - Endocrinology:  Oral Corticosteroids Failed - 01/10/2022  9:12 AM      Failed - This refill cannot be delegated      Failed - Manual Review: Eye exam for IOP if prolonged treatment      Failed - Glucose (serum) in normal range and within 180 days    Glucose, Bld  Date Value Ref Range Status  12/03/2021 115 (H) 65 - 99 mg/dL Final    Comment:    .            Fasting reference interval . For someone without known diabetes, a glucose value between 100 and 125 mg/dL is consistent with prediabetes and should be confirmed with a follow-up test. .          Failed - Valid encounter within last 6 months    Recent Outpatient Visits           8 months ago PMR (polymyalgia rheumatica) (HCC)   [facility] Pickard, Priscille Heidelberg, MD   1 year ago PMR (polymyalgia rheumatica) (HCC)   [facility] Donita Brooks, MD   1 year ago PMR (polymyalgia rheumatica) (HCC)   [facility] Donita Brooks, MD   2 years ago Stage 3b chronic kidney disease (HCC)   [facility] Donita Brooks, MD   2 years ago Bilateral leg weakness   [facility] Pickard, Priscille Heidelberg, MD              Failed - Bone Mineral Density or Dexa Scan completed in the last 2 years      Passed - K in normal range and within 180 days    Potassium  Date Value Ref Range Status  12/03/2021 4.3 3.5 - 5.3 mmol/L Final         Passed - Na in normal range  and within 180 days    Sodium  Date Value Ref Range Status  12/03/2021 139 135 - 146 mmol/L Final         Passed - Last BP in normal range    BP Readings from Last 1 Encounters:  12/13/21 130/60         Signed Prescriptions Disp Refills   amLODipine (NORVASC) 5 MG tablet 90 tablet 1    Sig: Take 1 tablet by mouth once daily     Cardiovascular: Calcium Channel Blockers 2 Failed - 01/10/2022  9:12 AM      Failed - Valid encounter within last 6 months    Recent Outpatient Visits           8 months ago PMR (polymyalgia rheumatica) (HCC)   [facility] Pickard, Priscille Heidelberg, MD   1 year ago PMR (polymyalgia rheumatica) (HCC)   [facility] Pickard, Priscille Heidelberg, MD   1 year ago PMR (polymyalgia rheumatica) (HCC)   [facility] Donita Brooks,  MD   2 years ago Stage 3b chronic kidney disease (HCC)   [facility] Pickard, Priscille Heidelberg, MD   2 years ago Bilateral leg weakness   [facility] Pickard, Priscille Heidelberg, MD              Passed - Last BP in normal range    BP Readings from Last 1 Encounters:  12/13/21 130/60         Passed - Last Heart Rate in normal range    Pulse Readings from Last 1 Encounters:  12/13/21 78

## 2022-01-10 NOTE — Telephone Encounter (Signed)
Requested Prescriptions  Pending Prescriptions Disp Refills   predniSONE (DELTASONE) 10 MG tablet [Pharmacy Med Name: predniSONE 10 MG Oral Tablet] 90 tablet 0    Sig: Take 1 tablet by mouth once daily with breakfast     Not Delegated - Endocrinology:  Oral Corticosteroids Failed - 01/10/2022  9:12 AM      Failed - This refill cannot be delegated      Failed - Manual Review: Eye exam for IOP if prolonged treatment      Failed - Glucose (serum) in normal range and within 180 days    Glucose, Bld  Date Value Ref Range Status  12/03/2021 115 (H) 65 - 99 mg/dL Final    Comment:    .            Fasting reference interval . For someone without known diabetes, a glucose value between 100 and 125 mg/dL is consistent with prediabetes and should be confirmed with a follow-up test. .          Failed - Valid encounter within last 6 months    Recent Outpatient Visits           8 months ago PMR (polymyalgia rheumatica) (HCC)   [facility] Pickard, Priscille Heidelberg, MD   1 year ago PMR (polymyalgia rheumatica) (HCC)   [facility] Donita Brooks, MD   1 year ago PMR (polymyalgia rheumatica) (HCC)   [facility] Donita Brooks, MD   2 years ago Stage 3b chronic kidney disease (HCC)   [facility] Donita Brooks, MD   2 years ago Bilateral leg weakness   [facility] Pickard, Priscille Heidelberg, MD              Failed - Bone Mineral Density or Dexa Scan completed in the last 2 years      Passed - K in normal range and within 180 days    Potassium  Date Value Ref Range Status  12/03/2021 4.3 3.5 - 5.3 mmol/L Final         Passed - Na in normal range and within 180 days    Sodium  Date Value Ref Range Status  12/03/2021 139 135 - 146 mmol/L Final         Passed - Last BP in normal range    BP Readings from Last 1 Encounters:  12/13/21 130/60          amLODipine (NORVASC) 5 MG tablet [Pharmacy Med  Name: amLODIPine Besylate 5 MG Oral Tablet] 90 tablet 1    Sig: Take 1 tablet by mouth once daily     Cardiovascular: Calcium Channel Blockers 2 Failed - 01/10/2022  9:12 AM      Failed - Valid encounter within last 6 months    Recent Outpatient Visits           8 months ago PMR (polymyalgia rheumatica) (HCC)   [facility] Pickard, Priscille Heidelberg, MD   1 year ago PMR (polymyalgia rheumatica) (HCC)   [facility] Pickard, Priscille Heidelberg, MD   1 year ago PMR (polymyalgia rheumatica) (HCC)   [facility] Donita Brooks, MD   2 years ago Stage 3b chronic kidney disease (HCC)   [facility] Donita Brooks, MD   2 years ago Bilateral leg weakness   [facility] Pickard, Priscille Heidelberg, MD  Passed - Last BP in normal range    BP Readings from Last 1 Encounters:  12/13/21 130/60         Passed - Last Heart Rate in normal range    Pulse Readings from Last 1 Encounters:  12/13/21 78

## 2022-01-14 ENCOUNTER — Other Ambulatory Visit: Payer: Self-pay

## 2022-01-14 ENCOUNTER — Other Ambulatory Visit: Payer: Self-pay | Admitting: Family Medicine

## 2022-01-14 ENCOUNTER — Encounter: Payer: Medicare Other | Admitting: Physician Assistant

## 2022-01-14 DIAGNOSIS — G603 Idiopathic progressive neuropathy: Secondary | ICD-10-CM | POA: Diagnosis not present

## 2022-01-14 DIAGNOSIS — I129 Hypertensive chronic kidney disease with stage 1 through stage 4 chronic kidney disease, or unspecified chronic kidney disease: Secondary | ICD-10-CM | POA: Diagnosis not present

## 2022-01-14 DIAGNOSIS — N183 Chronic kidney disease, stage 3 unspecified: Secondary | ICD-10-CM | POA: Diagnosis not present

## 2022-01-14 DIAGNOSIS — L97812 Non-pressure chronic ulcer of other part of right lower leg with fat layer exposed: Secondary | ICD-10-CM | POA: Diagnosis not present

## 2022-01-14 DIAGNOSIS — I872 Venous insufficiency (chronic) (peripheral): Secondary | ICD-10-CM | POA: Diagnosis not present

## 2022-01-14 DIAGNOSIS — M5136 Other intervertebral disc degeneration, lumbar region: Secondary | ICD-10-CM

## 2022-01-14 MED ORDER — TRAMADOL HCL 50 MG PO TABS: 50.0000 mg | ORAL_TABLET | Freq: Four times a day (QID) | ORAL | 2 refills | Status: DC | PRN

## 2022-01-14 MED ORDER — PREDNISONE 10 MG PO TABS: 10.0000 mg | ORAL_TABLET | Freq: Every day | ORAL | 0 refills | Status: DC

## 2022-01-14 NOTE — Telephone Encounter (Signed)
Patient was seen by orthopedics on 01/09/22 she requests prednisone and xanax refills

## 2022-01-14 NOTE — Telephone Encounter (Signed)
Requested medication (s) are due for refill today - no  Requested medication (s) are on the active medication list -no  Future visit scheduled -no  Last refill: 06/21/21  Notes to clinic: non delegated rx, not current on medication list  Requested Prescriptions  Pending Prescriptions Disp Refills   predniSONE (DELTASONE) 10 MG tablet [Pharmacy Med Name: predniSONE 10 MG Oral Tablet] 90 tablet 0    Sig: Take 1 tablet by mouth once daily with breakfast     Not Delegated - Endocrinology:  Oral Corticosteroids Failed - 01/14/2022 11:17 AM      Failed - This refill cannot be delegated      Failed - Manual Review: Eye exam for IOP if prolonged treatment      Failed - Glucose (serum) in normal range and within 180 days    Glucose, Bld  Date Value Ref Range Status  12/03/2021 115 (H) 65 - 99 mg/dL Final    Comment:    .            Fasting reference interval . For someone without known diabetes, a glucose value between 100 and 125 mg/dL is consistent with prediabetes and should be confirmed with a follow-up test. .          Failed - Valid encounter within last 6 months    Recent Outpatient Visits           8 months ago PMR (polymyalgia rheumatica) (HCC)   [facility] Pickard, Priscille Heidelberg, MD   1 year ago PMR (polymyalgia rheumatica) (HCC)   [facility] Donita Brooks, MD   1 year ago PMR (polymyalgia rheumatica) (HCC)   [facility] Donita Brooks, MD   2 years ago Stage 3b chronic kidney disease (HCC)   [facility] Donita Brooks, MD   2 years ago Bilateral leg weakness   [facility] Pickard, Priscille Heidelberg, MD              Failed - Bone Mineral Density or Dexa Scan completed in the last 2 years      Passed - K in normal range and within 180 days    Potassium  Date Value Ref Range Status  12/03/2021 4.3 3.5 - 5.3 mmol/L Final         Passed - Na in normal range and within 180  days    Sodium  Date Value Ref Range Status  12/03/2021 139 135 - 146 mmol/L Final         Passed - Last BP in normal range    BP Readings from Last 1 Encounters:  12/13/21 130/60            Requested Prescriptions  Pending Prescriptions Disp Refills   predniSONE (DELTASONE) 10 MG tablet [Pharmacy Med Name: predniSONE 10 MG Oral Tablet] 90 tablet 0    Sig: Take 1 tablet by mouth once daily with breakfast     Not Delegated - Endocrinology:  Oral Corticosteroids Failed - 01/14/2022 11:17 AM      Failed - This refill cannot be delegated      Failed - Manual Review: Eye exam for IOP if prolonged treatment      Failed - Glucose (serum) in normal range and within 180 days    Glucose, Bld  Date Value Ref Range Status  12/03/2021 115 (H) 65 - 99 mg/dL Final    Comment:    .  Fasting reference interval . For someone without known diabetes, a glucose value between 100 and 125 mg/dL is consistent with prediabetes and should be confirmed with a follow-up test. .          Failed - Valid encounter within last 6 months    Recent Outpatient Visits           8 months ago PMR (polymyalgia rheumatica) (HCC)   [facility] Pickard, Priscille Heidelberg, MD   1 year ago PMR (polymyalgia rheumatica) (HCC)   [facility] Donita Brooks, MD   1 year ago PMR (polymyalgia rheumatica) (HCC)   [facility] Donita Brooks, MD   2 years ago Stage 3b chronic kidney disease (HCC)   [facility] Donita Brooks, MD   2 years ago Bilateral leg weakness   [facility] Pickard, Priscille Heidelberg, MD              Failed - Bone Mineral Density or Dexa Scan completed in the last 2 years      Passed - K in normal range and within 180 days    Potassium  Date Value Ref Range Status  12/03/2021 4.3 3.5 - 5.3 mmol/L Final         Passed - Na in normal range and within 180 days    Sodium  Date Value Ref Range  Status  12/03/2021 139 135 - 146 mmol/L Final         Passed - Last BP in normal range    BP Readings from Last 1 Encounters:  12/13/21 130/60

## 2022-01-14 NOTE — Progress Notes (Addendum)
[name], [name] ([id number]) [id number] Page 1 of 10 Visit Report for 01/14/2022 Arrival Information Details Patient Name: Date of Service: [name], [name]. 01/14/2022 3:00 PM Medical Record Number: [id number] Patient Account Number: [id number] Date of Birth/Sex: Treating RN: 08/02/1931 (88 y.o. Colleen FinnerF) Fox, Colleen Primary Care [name]: Colleen FerrierPickard, Colleen Other Clinician: Betha LoaVenable, Fox Referring [name]: Treating [name]/Extender: [name], [name], Colleen Weeks in Treatment: 1 Visit Information History Since Last Visit All ordered tests and consults were completed: No Patient Arrived: Colleen HumphreysWalker Added or deleted any medications: No Arrival Time: 15:21 Any new allergies or adverse reactions: No Transfer Assistance: None Had a fall or experienced change in No Patient Identification Verified: Yes activities of daily living that may affect Secondary Verification Process Completed: Yes risk of falls: Patient Requires Transmission-Based Precautions: No Signs or symptoms of abuse/neglect since last visito No Patient Has Alerts: No Hospitalized since last visit: No Implantable device outside of the clinic excluding No cellular tissue based products placed in the center since last visit: Has Dressing in Place as Prescribed: Yes Has Compression in Place as Prescribed: Yes Pain Present Now: Yes Electronic Signature(s) Signed: 01/22/2022 4:45:28 PM By: [name] Entered By: [name] on 01/14/2022 15:35:28 -------------------------------------------------------------------------------- Clinic Level of Care Assessment Details Patient Name: Date of Service: [name], [name]. 01/14/2022 3:00 PM Medical Record Number: [id number] Patient Account Number: [id number] Date of Birth/Sex: Treating RN: 05/18/1931 (88 y.o. Colleen FinnerF) Fox, Colleen Primary Care [name]: Colleen FerrierPickard, Colleen Other Clinician: Betha LoaVenable, Fox Referring [name]: Treating [name]/Extender:  [name], [name], Colleen Weeks in Treatment: 1 Clinic Level of Care Assessment Items TOOL 1 Quantity Score []  - 0 Use when EandM and Procedure is performed on INITIAL visit ASSESSMENTS - Nursing Assessment / Reassessment []  - 0 General Physical Exam (combine w/ comprehensive assessment (listed just below) when performed on new pt. evalsErasmo Score) [name], [name] ([id number]) [id number] Page 2 of 10 []  - 0 Comprehensive Assessment (HX, ROS, Risk Assessments, Wounds Hx, etc.) ASSESSMENTS - Wound and Skin Assessment / Reassessment []  - 0 Dermatologic / Skin Assessment (not related to wound area) ASSESSMENTS - Ostomy and/or Continence Assessment and Care []  - 0 Incontinence Assessment and Management []  - 0 Ostomy Care Assessment and Management (repouching, etc.) PROCESS - Coordination of Care []  - 0 Simple Patient / Family Education for ongoing care []  - 0 Complex (extensive) Patient / Family Education for ongoing care []  - 0 Staff obtains ChiropractorConsents, Records, T Results / Process Orders est []  - 0 Staff telephones HHA, Nursing Homes / Clarify orders / etc []  - 0 Routine Transfer to another Facility (non-emergent condition) []  - 0 Routine Hospital Admission (non-emergent condition) []  - 0 New Admissions / Manufacturing engineernsurance Authorizations / Ordering NPWT Apligraf, etc. , []  - 0 Emergency Hospital Admission (emergent condition) PROCESS - Special Needs []  - 0 Pediatric / Minor Patient Management []  - 0 Isolation Patient Management []  - 0 Hearing / Language / Visual special needs []  - 0 Assessment of Community assistance (transportation, D/C planning, etc.) []  - 0 Additional assistance / Altered mentation []  - 0 Support Surface(s) Assessment (bed, cushion, seat, etc.) INTERVENTIONS - Miscellaneous []  - 0 External ear exam []  - 0 Patient Transfer (multiple staff / Nurse, adultHoyer Lift / Similar devices) []  - 0 Simple Staple / Suture removal (25 or less) []  -  0 Complex Staple / Suture removal (26 or more) []  - 0 Hypo/Hyperglycemic Management (do not check if billed separately) []  - 0 Ankle / Brachial Index (ABI) - do not check if billed  separately Has the patient been seen at the hospital within the last three years: Yes Total Score: 0 Level Of Care: ____ Electronic Signature(s) Signed: 01/22/2022 4:45:28 PM By: Colleen Loa Entered By: Colleen Loa on 01/14/2022 16:02:10 -------------------------------------------------------------------------------- Compression Therapy Details Patient Name: Date of Service: [name], [name] 01/14/2022 3:00 PM Medical Record Number: [id number] Patient Account Number: [id number] Date of Birth/Sex: Treating RN: [birth year] (88 y.o. [name] Primary Care Laquitha Heslin: [name] Other Clinician: Tiersa, Dayley ([id number]) [id number] Page 3 of 10 Referring [name]: Treating [name]/Extender: [name] in Treatment: 1 Compression Therapy Performed for Wound Assessment: Wound #1 Right,Lateral Lower Leg Performed By: Farrel Gordon, Fox, Compression Type: Three Layer Pre Treatment ABI: 0.9 Post Procedure Diagnosis Same as Pre-procedure Electronic Signature(s) Signed: 01/22/2022 4:45:28 PM By: Colleen Loa Entered By: Colleen Loa on 01/14/2022 16:00:21 -------------------------------------------------------------------------------- Encounter Discharge Information Details Patient Name: Date of Service: Colleen Score 01/14/2022 3:00 PM Medical Record Number: [id number] Patient Account Number: [id number] Date of Birth/Sex: Treating RN: 1931/05/23 (88 y.o. [name] Primary Care Alisia Vanengen: [name] Other Clinician: Betha Loa Referring Wylodean Shimmel: Treating Arnitra Sokoloski/Extender: [name] in Treatment: 1 Encounter Discharge Information Items Discharge Condition:  Stable Ambulatory Status: Walker Discharge Destination: Home Transportation: Private Auto Accompanied By: self Schedule Follow-up Appointment: Yes Clinical Summary of Care: Electronic Signature(s) Signed: 01/22/2022 4:45:28 PM By: Colleen Loa Entered By: Colleen Loa on 01/14/2022 17:03:07 -------------------------------------------------------------------------------- Lower Extremity Assessment Details Patient Name: Date of Service: [name], [name] 01/14/2022 3:00 PM Medical Record Number: [id number] Patient Account Number: [id number] Date of Birth/Sex: Treating RN: Nov 27, 1931 (88 y.o. [name] Primary Care Diann Bangerter: [name] Other Clinician: Betha Loa Referring Kelis Plasse: Treating Schae Cando/Extender: [name] in Treatment: 1 [name], [name] ([id number]) [id number] Page 4 of 10 Edema Assessment Assessed: [Left: No] [Right: Yes] [Left: Edema] [Right: :] Calf Left: Right: Point of Measurement: 32 cm From Medial Instep 35 cm Ankle Left: Right: Point of Measurement: 11 cm From Medial Instep 22.1 cm Vascular Assessment Pulses: Dorsalis Pedis Palpable: [Right:Yes] Electronic Signature(s) Signed: 01/15/2022 4:34:43 PM By: Yevonne Pax RN Signed: 01/22/2022 4:45:28 PM By: Colleen Loa Entered By: Colleen Loa on 01/14/2022 15:46:58 -------------------------------------------------------------------------------- Multi Wound Chart Details Patient Name: Date of Service: Colleen Score 01/14/2022 3:00 PM Medical Record Number: [id number] Patient Account Number: [id number] Date of Birth/Sex: Treating RN: 03-26-1931 (88 y.o. [name] Primary Care Onelia Cadmus: [name] Other Clinician: Betha Loa Referring Syncere Eble: Treating Cadarius Nevares/Extender: Wilma Flavin Weeks in Treatment: 1 Vital Signs Height(in): 63 Pulse(bpm): 81 Weight(lbs): 140 Blood Pressure(mmHg):  156/72 Body Mass Index(BMI): 24.8 Temperature(F): 98.2 Respiratory Rate(breaths/min): 16 [1:Photos:] [N/A:N/A] Right, Lateral Lower Leg Proximal Lower Leg N/A Wound Location: Gradually Appeared Trauma N/A Wounding Event: Venous Leg Ulcer Skin Tear N/A Primary Etiology: Hypertension Hypertension N/A Comorbid History: 10/05/2021 01/14/2022 N/A Date Acquired: 1 0 N/A Weeks of Treatment: Open Open N/A Wound Status: No No N/A Wound Recurrence: [name], [name] ([id number]) [id number] Page 5 of 10 5.2x3.1x0.2 1x0.7x0.1 N/A Measurements Fox x W x D (cm) 12.661 0.55 N/A A (cm) : rea 2.532 0.055 N/A Volume (cm) : 19.20% N/A N/A % Reduction in Area: 19.20% N/A N/A % Reduction in Volume: Full Thickness Without Exposed Partial Thickness N/A Classification: Support Structures Medium Small N/A Exudate Amount: Serosanguineous Serous N/A Exudate Type: red, brown amber N/A Exudate Color: N/A Flat and Intact N/A Wound Margin: Medium (34-66%) None Present (0%) N/A Granulation Amount: Red N/A N/A  Granulation Quality: Medium (34-66%) None Present (0%) N/A Necrotic Amount: Fascia: No Fascia: No N/A Exposed Structures: Fat Layer (Subcutaneous Tissue): No Fat Layer (Subcutaneous Tissue): No Tendon: No Tendon: No Muscle: No Muscle: No Joint: No Joint: No Bone: No Bone: No None None N/A Epithelialization: N/A wound is from bandage scissors, N/A Assessment Notes: cutting off three layer wrap Compression Therapy N/A N/A Procedures Performed: Treatment Notes Electronic Signature(s) Signed: 01/22/2022 4:45:28 PM By: Colleen Loa Entered By: Colleen Loa on 01/14/2022 17:01:13 -------------------------------------------------------------------------------- Multi-Disciplinary Care Plan Details Patient Name: Date of Service: Colleen Score 01/14/2022 3:00 PM Medical Record Number: [id number] Patient Account Number: [id number] Date of  Birth/Sex: Treating RN: [birth year] (88 y.o. [name] Primary Care Savio Albrecht: [name] Other Clinician: Betha Loa Referring Tamrah Victorino: Treating Hosanna Betley/Extender: [name] in Treatment: 1 Active Inactive Abuse / Safety / Falls / Self Care Management Nursing Diagnoses: Potential for injury related to falls Goals: Patient will remain injury free related to falls Date Initiated: 01/07/2022 Target Resolution Date: 02/07/2022 Goal Status: Active Interventions: Assess Activities of Daily Living upon admission and as needed Assess fall risk on admission and as needed Assess: immobility, friction, shearing, incontinence upon admission and as needed Assess impairment of mobility on admission and as needed per policy Assess personal safety and home safety (as indicated) on admission and as needed Assess self care needs on admission and as needed Notes: [name], [name] ([id number]) [phone].pdf Page 6 of 10 Wound/Skin Impairment Nursing Diagnoses: Knowledge deficit related to ulceration/compromised skin integrity Goals: Patient/caregiver will verbalize understanding of skin care regimen Date Initiated: 01/07/2022 Target Resolution Date: 02/07/2022 Goal Status: Active Ulcer/skin breakdown will have a volume reduction of 30% by week 4 Date Initiated: 01/07/2022 Target Resolution Date: 02/07/2022 Goal Status: Active Ulcer/skin breakdown will have a volume reduction of 50% by week 8 Date Initiated: 01/07/2022 Target Resolution Date: 03/10/2022 Goal Status: Active Ulcer/skin breakdown will have a volume reduction of 80% by week 12 Date Initiated: 01/07/2022 Target Resolution Date: 04/08/2022 Goal Status: Active Ulcer/skin breakdown will heal within 14 weeks Date Initiated: 01/07/2022 Target Resolution Date: 05/10/2022 Goal Status: Active Interventions: Assess patient/caregiver ability to obtain necessary supplies Assess  patient/caregiver ability to perform ulcer/skin care regimen upon admission and as needed Assess ulceration(s) every visit Notes: Electronic Signature(s) Signed: 01/15/2022 4:34:43 PM By: Yevonne Pax RN Signed: 01/22/2022 4:45:28 PM By: Colleen Loa Entered By: Colleen Loa on 01/14/2022 17:02:03 -------------------------------------------------------------------------------- Pain Assessment Details Patient Name: Date of Service: Colleen Score 01/14/2022 3:00 PM Medical Record Number: [id number] Patient Account Number: [id number] Date of Birth/Sex: Treating RN: January 13, 1932 (88 y.o. [name] Primary Care Okechukwu Regnier: [name] Other Clinician: Betha Loa Referring Ramiro Pangilinan: Treating Amylia Collazos/Extender: [name] in Treatment: 1 Active Problems Location of Pain Severity and Description of Pain Patient Has Paino Yes Site Locations Pain Location: [name], [name] ([id number]) [phone].pdf Page 7 of 10 Pain Location: Generalized Pain, Pain in Ulcers Duration of the Pain. Constant / Intermittento Constant Rate the pain. Current Pain Level: 5 Character of Pain Describe the Pain: Sharp, Throbbing Pain Management and Medication Current Pain Management: Medication: Yes Cold Application: No Rest: Yes Massage: No Activity: No T.E.N.S.: No Heat Application: No Leg drop or elevation: No Is the Current Pain Management Adequate: Inadequate How does your wound impact your activities of daily livingo Sleep: No Bathing: No Appetite: No Relationship With Others: No Bladder Continence: No Emotions: No Bowel Continence: No Work: No Toileting: No Drive: No Dressing: No Hobbies:  No Electronic Signature(s) Signed: 01/15/2022 4:34:43 PM By: Yevonne Pax RN Signed: 01/22/2022 4:45:28 PM By: Colleen Loa Entered By: Colleen Loa on 01/14/2022  15:38:38 -------------------------------------------------------------------------------- Patient/Caregiver Education Details Patient Name: Date of Service: Colleen Score 12/11/2023andnbsp3:00 PM Medical Record Number: [id number] Patient Account Number: [id number] Date of Birth/Gender: Treating RN: 16-Nov-1931 (88 y.o. [name] Primary Care Physician: [name] Other Clinician: Betha Loa Referring Physician: Treating Physician/Extender: [name] in Treatment: 1 Education Assessment Education Provided To: Patient Education Topics Provided Wound/Skin Impairment: Handouts: Other: continue wound care as directed [name], [name] ([id number]) [phone].pdf Page 8 of 10 Electronic Signature(s) Signed: 01/22/2022 4:45:28 PM By: Colleen Loa Entered By: Colleen Loa on 01/14/2022 17:01:58 -------------------------------------------------------------------------------- Wound Assessment Details Patient Name: Date of Service: [name], [name] 01/14/2022 3:00 PM Medical Record Number: [id number] Patient Account Number: [id number] Date of Birth/Sex: Treating RN: [birth year] (88 y.o. [name] Primary Care Bryer Cozzolino: [name] Other Clinician: Betha Loa Referring Lawrence Roldan: Treating Ai Sonnenfeld/Extender: Wilma Flavin Weeks in Treatment: 1 Wound Status Wound Number: 1 Primary Etiology: Venous Leg Ulcer Wound Location: Right, Lateral Lower Leg Wound Status: Open Wounding Event: Gradually Appeared Comorbid History: Hypertension Date Acquired: 10/05/2021 Weeks Of Treatment: 1 Clustered Wound: No Photos Wound Measurements Length: (cm) 5.2 Width: (cm) 3.1 Depth: (cm) 0.2 Area: (cm) 12.661 Volume: (cm) 2.532 % Reduction in Area: 19.2% % Reduction in Volume: 19.2% Epithelialization: None Tunneling: No Undermining: No Wound Description Classification: Full Thickness Without  Exposed Suppor Exudate Amount: Medium Exudate Type: Serosanguineous Exudate Color: red, brown t Structures Foul Odor After Cleansing: No Slough/Fibrino Yes Wound Bed Granulation Amount: Medium (34-66%) Exposed Structure Granulation Quality: Red Fascia Exposed: No Necrotic Amount: Medium (34-66%) Fat Layer (Subcutaneous Tissue) Exposed: No Necrotic Quality: Adherent Slough Tendon Exposed: No Muscle Exposed: No Joint Exposed: No Bone Exposed: No [name], [name] ([id number]) [id number] Page 9 of 10 Electronic Signature(s) Signed: 01/15/2022 4:34:43 PM By: Yevonne Pax RN Signed: 01/22/2022 4:45:28 PM By: Colleen Loa Entered By: Colleen Loa on 01/14/2022 15:45:21 -------------------------------------------------------------------------------- Wound Assessment Details Patient Name: Date of Service: Colleen Score 01/14/2022 3:00 PM Medical Record Number: [id number] Patient Account Number: [id number] Date of Birth/Sex: Treating RN: August 29, 1931 (88 y.o. [name] Primary Care Elam Ellis: [name] Other Clinician: Betha Loa Referring Jamarian Jacinto: Treating Nazaria Riesen/Extender: Wilma Flavin Weeks in Treatment: 1 Wound Status Wound Number: 2 Primary Etiology: Skin Tear Wound Location: Proximal Lower Leg Wound Status: Open Wounding Event: Trauma Comorbid History: Hypertension Date Acquired: 01/14/2022 Weeks Of Treatment: 0 Clustered Wound: No Photos Wound Measurements Length: (cm) 1 Width: (cm) 0.7 Depth: (cm) 0.1 Area: (cm) 0.55 Volume: (cm) 0.055 % Reduction in Area: % Reduction in Volume: Epithelialization: None Tunneling: No Undermining: No Wound Description Classification: Partial Thickness Wound Margin: Flat and Intact Exudate Amount: Small Exudate Type: Serous Exudate Color: amber Foul Odor After Cleansing: No Slough/Fibrino No Wound Bed Granulation Amount: None Present (0%) Exposed  Structure Necrotic Amount: None Present (0%) Fascia Exposed: No Fat Layer (Subcutaneous Tissue) Exposed: No Tendon Exposed: No Muscle Exposed: No Joint Exposed: No Bone Exposed: No [name], [name] ([id number]) [id number] Page 10 of 10 Assessment Notes wound is from bandage scissors, cutting off three layer wrap Electronic Signature(s) Signed: 01/15/2022 4:34:43 PM By: Yevonne Pax RN Signed: 01/22/2022 4:45:28 PM By: Colleen Loa Entered By: Colleen Loa on 01/14/2022 15:59:21 -------------------------------------------------------------------------------- Vitals Details Patient Name: Date of Service: Colleen Score 01/14/2022 3:00 PM Medical Record Number: [id number] Patient Account Number: [id number] Date of  Birth/Sex: Treating RN: 02-15-1931 (88 y.o. [name] Primary Care Chozen Latulippe: [name] Other Clinician: Betha Loa Referring Helane Briceno: Treating Melquiades Kovar/Extender: [name] in Treatment: 1 Vital Signs Time Taken: 15:36 Temperature (F): 98.2 Height (in): 63 Pulse (bpm): 81 Weight (lbs): 140 Respiratory Rate (breaths/min): 16 Body Mass Index (BMI): 24.8 Blood Pressure (mmHg): 156/72 Reference Range: 80 - 120 mg / dl Electronic Signature(s) Signed: 01/22/2022 4:45:28 PM By: Colleen Loa Entered By: Colleen Loa on 01/14/2022 15:38:32

## 2022-01-14 NOTE — Progress Notes (Addendum)
Colleen, Fox ([id number]) [id number] Page 1 of 6 Visit Report for 01/14/2022 Chief Complaint Document Details Patient Name: Date of Service: [name], [name] 01/14/2022 3:00 PM Medical Record Number: [id number] Patient Account Number: [id number] Date of Birth/Sex: Treating RN: 1931-10-06 (88 y.o. [name] Primary Care Provider: Jenna Luo Other Clinician: Massie Kluver Referring Provider: Treating Provider/Extender: Barbette Merino in Treatment: 1 Information Obtained from: Patient Chief Complaint Right LE Ulcer Electronic Signature(s) Signed: 01/14/2022 3:15:03 PM By: [name] PA-C Entered By: [name] on 01/14/2022 15:15:03 -------------------------------------------------------------------------------- HPI Details Patient Name: Date of Service: [name] 01/14/2022 3:00 PM Medical Record Number: [id number] Patient Account Number: [id number] Date of Birth/Sex: Treating RN: 06-Apr-1931 (88 y.o. [name] Primary Care Provider: Jenna Luo Other Clinician: Massie Kluver Referring Provider: Treating Provider/Extender: Barbette Merino in Treatment: 1 History of Present Illness HPI Description: 01-07-2022 upon evaluation today patient appears to be doing poorly in regard to the wound on the right lateral lower extremity. She tells me that this occurred after a fall that she sustained on and around Labor Day weekend. Subsequently following this fall she tells me that she had a wound that really has not improved significantly. She has been seeing her primary care provider and they have been attempting to do what they could to get this improving overall. With that being said the patient has been on Silvadene cream, Neosporin, and more recently soaking with peroxide then wet to dry gauze recommended by primary care provider. Lastly Santyl was advised but they have not been  able to get that filled as of yet. Patient does have a history of neuropathy, chronic kidney disease stage III, and hypertension along with chronic venous insufficiency otherwise she is fairly healthy and has nothing else that should affect her ability to heal. 01-14-2022 upon evaluation today patient appears to be doing well currently in regard to her wound. She has been tolerating the dressing changes without complication. With that being said she still has quite a bit of necrotic tissue buildup on the surface of the wound unfortunately. Electronic Signature(s) Signed: 01/14/2022 4:06:36 PM By: [name], Michell Heinrich ([id number]) [id number] Page 2 of 6 Entered By: [name] on 01/14/2022 16:06:36 -------------------------------------------------------------------------------- Physical Exam Details Patient Name: Date of Service: Colleen, Fox 01/14/2022 3:00 PM Medical Record Number: [id number] Patient Account Number: [id number] Date of Birth/Sex: Treating RN: 1931/03/21 (88 y.o. [name] Primary Care Provider: Jenna Luo Other Clinician: Massie Kluver Referring Provider: Treating Provider/Extender: Veronia Beets Weeks in Treatment: 1 Constitutional Well-nourished and well-hydrated in no acute distress. Respiratory normal breathing without difficulty. Psychiatric this patient is able to make decisions and demonstrates good insight into disease process. Alert and Oriented x 3. pleasant and cooperative. Notes Upon inspection patient's wound bed actually showed signs of good granulation and epithelization at this point. Fortunately I see no evidence of infection locally nor systemically which is great news and overall I am extremely pleased with where we stand. Electronic Signature(s) Signed: 01/14/2022 4:07:02 PM By: [name] PA-C Entered By: [name] on 01/14/2022  16:07:01 -------------------------------------------------------------------------------- Physician Orders Details Patient Name: Date of Service: MAECEE, SCHNELL 01/14/2022 3:00 PM Medical Record Number: [id number] Patient Account Number: [id number] Date of Birth/Sex: Treating RN: [birth year] (88 y.o. [name] Primary Care Provider: Jenna Luo Other Clinician: Massie Kluver Referring Provider: Treating Provider/Extender: Barbette Merino in Treatment: 1 Verbal /  Phone Orders: No Diagnosis Coding ICD-10 Coding Code Description I87.331 Chronic venous hypertension (idiopathic) with ulcer and inflammation of right lower extremity G60.3 Idiopathic progressive neuropathy L97.812 Non-pressure chronic ulcer of other part of right lower leg with fat layer exposed N18.30 Chronic kidney disease, stage 3 unspecified SYRAI, Colleen ([id number]) [id number] Page 3 of 6 I10 Essential (primary) hypertension Follow-up Appointments Return Appointment in 1 week. Bathing/ Shower/ Hygiene May shower with wound dressing protected with water repellent cover or cast protector. No tub bath. Anesthetic (Use 'Patient Medications' Section for Anesthetic Order Entry) Lidocaine applied to wound bed Edema Control - Lymphedema / Segmental Compressive Device / Other Optional: One layer of unna paste to top of compression wrap (to act as an anchor). Elevate, Exercise Daily and A void Standing for Long Periods of Time. Elevate legs to the level of the heart and pump ankles as often as possible Elevate leg(s) parallel to the floor when sitting. Wound Treatment Wound #1 - Lower Leg Wound Laterality: Right, Lateral Cleanser: Wound Cleanser (Generic) 1 x Per Week/30 Days Discharge Instructions: Wash your hands with soap and water. Remove old dressing, discard into plastic bag and place into trash. Cleanse the wound with Wound Cleanser prior to applying a  clean dressing using gauze sponges, not tissues or cotton balls. Do not scrub or use excessive force. Pat dry using gauze sponges, not tissue or cotton balls. Prim Dressing: IODOFLEX 0.9% Cadexomer Iodine Pad 1 x Per Week/30 Days ary Discharge Instructions: Apply Iodoflex to wound bed only as directed. Secondary Dressing: ABD Pad 5x9 (in/in) 1 x Per Week/30 Days Discharge Instructions: Cover with ABD pad Compression Wrap: 3-LAYER WRAP - Profore Lite LF 3 Multilayer Compression Bandaging System 1 x Per Week/30 Days Discharge Instructions: Apply 3 multi-layer wrap as prescribed. Electronic Signature(s) Signed: 01/14/2022 5:07:14 PM By: [name] PA-C Signed: 01/22/2022 4:45:28 PM By: Massie Kluver Entered By: Massie Kluver on 01/14/2022 16:01:33 -------------------------------------------------------------------------------- Problem List Details Patient Name: Date of Service: [name] 01/14/2022 3:00 PM Medical Record Number: [id number] Patient Account Number: [id number] Date of Birth/Sex: Treating RN: 29-Jul-1931 (88 y.o. [name] Primary Care Provider: Jenna Luo Other Clinician: Massie Kluver Referring Provider: Treating Provider/Extender: Veronia Beets Weeks in Treatment: 1 Active Problems ICD-10 Encounter Code Description Active Date MDM Diagnosis I87.331 Chronic venous hypertension (idiopathic) with ulcer and inflammation of right 01/07/2022 No Yes lower extremity G60.3 Idiopathic progressive neuropathy 01/07/2022 No Yes AZHAR, BOHACH ([id number]) [id number] Page 4 of 6 [phone] Non-pressure chronic ulcer of other part of right lower leg with fat layer 01/07/2022 No Yes exposed N18.30 Chronic kidney disease, stage 3 unspecified 01/07/2022 No Yes I10 Essential (primary) hypertension 01/07/2022 No Yes Inactive Problems Resolved Problems Electronic Signature(s) Signed: 01/14/2022 3:14:56 PM By: [name] PA-C Entered By: [name] on 01/14/2022 15:14:56 -------------------------------------------------------------------------------- Progress Note Details Patient Name: Date of Service: [name] 01/14/2022 3:00 PM Medical Record Number: [id number] Patient Account Number: [id number] Date of Birth/Sex: Treating RN: 1931/05/01 (88 y.o. [name] Primary Care Provider: Jenna Luo Other Clinician: Massie Kluver Referring Provider: Treating Provider/Extender: Barbette Merino in Treatment: 1 Subjective Chief Complaint Information obtained from Patient Right LE Ulcer History of Present Illness (HPI) 01-07-2022 upon evaluation today patient appears to be doing poorly in regard to the wound on the right lateral lower extremity. She tells me that this occurred after a fall that she sustained on and around Labor Day weekend. Subsequently following this fall  she tells me that she had a wound that really has not improved significantly. She has been seeing her primary care provider and they have been attempting to do what they could to get this improving overall. With that being said the patient has been on Silvadene cream, Neosporin, and more recently soaking with peroxide then wet to dry gauze recommended by primary care provider. Lastly Santyl was advised but they have not been able to get that filled as of yet. Patient does have a history of neuropathy, chronic kidney disease stage III, and hypertension along with chronic venous insufficiency otherwise she is fairly healthy and has nothing else that should affect her ability to heal. 01-14-2022 upon evaluation today patient appears to be doing well currently in regard to her wound. She has been tolerating the dressing changes without complication. With that being said she still has quite a bit of necrotic tissue buildup on the surface of the wound  unfortunately. Objective Constitutional Well-nourished and well-hydrated in no acute distress. FREDDY, SPADAFORA ([id number]) [id number] Page 5 of 6 Vitals Time Taken: 3:36 PM, Height: 63 in, Weight: 140 lbs, BMI: 24.8, Temperature: 98.2 F, Pulse: 81 bpm, Respiratory Rate: 16 breaths/min, Blood Pressure: 156/72 mmHg. Respiratory normal breathing without difficulty. Psychiatric this patient is able to make decisions and demonstrates good insight into disease process. Alert and Oriented x 3. pleasant and cooperative. General Notes: Upon inspection patient's wound bed actually showed signs of good granulation and epithelization at this point. Fortunately I see no evidence of infection locally nor systemically which is great news and overall I am extremely pleased with where we stand. Integumentary (Hair, Skin) Wound #1 status is Open. Original cause of wound was Gradually Appeared. The date acquired was: 10/05/2021. The wound has been in treatment 1 weeks. The wound is located on the Right,Lateral Lower Leg. The wound measures 5.2cm length x 3.1cm width x 0.2cm depth; 12.661cm^2 area and 2.532cm^3 volume. There is no tunneling or undermining noted. There is a medium amount of serosanguineous drainage noted. There is medium (34-66%) red granulation within the wound bed. There is a medium (34-66%) amount of necrotic tissue within the wound bed including Adherent Slough. Wound #2 status is Open. Original cause of wound was Trauma. The date acquired was: 01/14/2022. The wound is located on the Proximal Lower Leg. The wound measures 1cm length x 0.7cm width x 0.1cm depth; 0.55cm^2 area and 0.055cm^3 volume. There is no tunneling or undermining noted. There is a small amount of serous drainage noted. The wound margin is flat and intact. There is no granulation within the wound bed. There is no necrotic tissue within the wound bed. General Notes: wound is from bandage  scissors, cutting off three layer wrap Assessment Active Problems ICD-10 Chronic venous hypertension (idiopathic) with ulcer and inflammation of right lower extremity Idiopathic progressive neuropathy Non-pressure chronic ulcer of other part of right lower leg with fat layer exposed Chronic kidney disease, stage 3 unspecified Essential (primary) hypertension Procedures Wound #1 Pre-procedure diagnosis of Wound #1 is a Venous Leg Ulcer located on the Right,Lateral Lower Leg . There was a Three Layer Compression Therapy Procedure with a pre-treatment ABI of 0.9 by Betha Loa. Post procedure Diagnosis Wound #1: Same as Pre-Procedure Plan Follow-up Appointments: Return Appointment in 1 week. Bathing/ Shower/ Hygiene: May shower with wound dressing protected with water repellent cover or cast protector. No tub bath. Anesthetic (Use 'Patient Medications' Section for Anesthetic Order Entry): Lidocaine applied to wound bed Edema Control - Lymphedema /  Segmental Compressive Device / Other: Optional: One layer of unna paste to top of compression wrap (to act as an anchor). Elevate, Exercise Daily and Avoid Standing for Long Periods of Time. Elevate legs to the level of the heart and pump ankles as often as possible Elevate leg(s) parallel to the floor when sitting. WOUND #1: - Lower Leg Wound Laterality: Right, Lateral Cleanser: Wound Cleanser (Generic) 1 x Per Week/30 Days Discharge Instructions: Wash your hands with soap and water. Remove old dressing, discard into plastic bag and place into trash. Cleanse the wound with Wound Cleanser prior to applying a clean dressing using gauze sponges, not tissues or cotton balls. Do not scrub or use excessive force. Pat dry using gauze sponges, not tissue or cotton balls. Prim Dressing: IODOFLEX 0.9% Cadexomer Iodine Pad 1 x Per Week/30 Days ary Discharge Instructions: Apply Iodoflex to wound bed only as directed. Secondary Dressing: ABD Pad 5x9  (in/in) 1 x Per Week/30 Days Discharge Instructions: Cover with ABD pad Com pression Wrap: 3-LAYER WRAP - Profore Lite LF 3 Multilayer Compression Bandaging System 1 x Per Week/30 Days Discharge Instructions: Apply 3 multi-layer wrap as prescribed. 1. Based on what I see I do believe that the patient really needs some sharp debridement though she was adamant about not wanting me to do that today. I discussed with her why that was necessary. In the end it was decided that I would not perform any debridement I did actually recommend then the weeks go SHADARIA, GREENLAW ([id number]) [id number] Page 6 of 6 ahead and switch to a different dressing and I Minna suggest the Iodoflex to try to see if we can help clean up the surface of this wound. She is in agreement with that plan. 2. Regular continue with the ABD pad followed by 3 layer compression wrap. We will see patient back for reevaluation in 1 week here in the clinic. If anything worsens or changes patient will contact our office for additional recommendations. Electronic Signature(s) Signed: 01/14/2022 4:07:40 PM By: [name] PA-C Entered By: [name] on 01/14/2022 16:07:39 -------------------------------------------------------------------------------- SuperBill Details Patient Name: Date of Service: [name] 01/14/2022 Medical Record Number: [id number] Patient Account Number: [id number] Date of Birth/Sex: Treating RN: [birth year] (88 y.o. [name] Primary Care Provider: Jenna Luo Other Clinician: Massie Kluver Referring Provider: Treating Provider/Extender: Barbette Merino in Treatment: 1 Diagnosis Coding ICD-10 Codes Code Description [phone] Chronic venous hypertension (idiopathic) with ulcer and inflammation of right lower extremity G60.3 Idiopathic progressive neuropathy L97.812 Non-pressure chronic ulcer of other part of right lower leg with fat  layer exposed N18.30 Chronic kidney disease, stage 3 unspecified I10 Essential (primary) hypertension Facility Procedures : CPT4 Code: [id number] Description: (Facility Use Only) [phone] - [facility] LEG ICD-10 Diagnosis Description I87.331 Chronic venous hypertension (idiopathic) with ulcer and inflammation of right lower ex Modifier: tremity Quantity: 1 Physician Procedures : CPT4 Code Description Modifier [id number] - WC PHYS LEVEL 3 - EST PT ICD-10 Diagnosis Description I87.331 Chronic venous hypertension (idiopathic) with ulcer and inflammation of right lower extremity G60.3 Idiopathic progressive neuropathy L97.812  Non-pressure chronic ulcer of other part of right lower leg with fat layer exposed N18.30 Chronic kidney disease, stage 3 unspecified Quantity: 1 Electronic Signature(s) Signed: 01/14/2022 4:07:59 PM By: [name] PA-C Entered By: [name] on 01/14/2022 16:07:59

## 2022-01-21 ENCOUNTER — Encounter: Payer: Medicare Other | Admitting: Physician Assistant

## 2022-01-21 DIAGNOSIS — L97812 Non-pressure chronic ulcer of other part of right lower leg with fat layer exposed: Secondary | ICD-10-CM | POA: Diagnosis not present

## 2022-01-21 DIAGNOSIS — N183 Chronic kidney disease, stage 3 unspecified: Secondary | ICD-10-CM | POA: Diagnosis not present

## 2022-01-21 DIAGNOSIS — G603 Idiopathic progressive neuropathy: Secondary | ICD-10-CM | POA: Diagnosis not present

## 2022-01-21 DIAGNOSIS — S81812A Laceration without foreign body, left lower leg, initial encounter: Secondary | ICD-10-CM | POA: Diagnosis not present

## 2022-01-21 DIAGNOSIS — I129 Hypertensive chronic kidney disease with stage 1 through stage 4 chronic kidney disease, or unspecified chronic kidney disease: Secondary | ICD-10-CM | POA: Diagnosis not present

## 2022-01-21 DIAGNOSIS — I872 Venous insufficiency (chronic) (peripheral): Secondary | ICD-10-CM | POA: Diagnosis not present

## 2022-01-21 NOTE — Progress Notes (Addendum)
KHALEESI, GRUEL ([id number]) [id number] Page 1 of 6 Visit Report for 01/21/2022 Chief Complaint Document Details Patient Name: Date of Service: [name], [name] 01/21/2022 2:45 PM Medical Record Number: [id number] Patient Account Number: [id number] Date of Birth/Sex: Treating RN: [name] (88 y.o. [name] Primary Care Provider: Lynnea Fox Other Clinician: Referring Provider: Treating Provider/Extender: [name] in Treatment: 2 Information Obtained from: Patient Chief Complaint Right LE Ulcer Electronic Signature(s) Signed: 01/21/2022 3:13:25 PM By: Lenda Kelp PA-C Entered By: Lenda Kelp on 01/21/2022 15:13:25 -------------------------------------------------------------------------------- HPI Details Patient Name: Date of Service: [name], [name] 01/21/2022 2:45 PM Medical Record Number: [id number] Patient Account Number: [id number] Date of Birth/Sex: Treating RN: [name] (88 y.o. [name] Primary Care Provider: Lynnea Fox Other Clinician: Referring Provider: Treating Provider/Extender: [name] in Treatment: 2 History of Present Illness HPI Description: 01-07-2022 upon evaluation today patient appears to be doing poorly in regard to the wound on the right lateral lower extremity. She tells me that this occurred after a fall that she sustained on and around Labor Day weekend. Subsequently following this fall she tells me that she had a wound that really has not improved significantly. She has been seeing her primary care provider and they have been attempting to do what they could to get this improving overall. With that being said the patient has been on Silvadene cream, Neosporin, and more recently soaking with peroxide then wet to dry gauze recommended by primary care provider. Lastly Santyl was advised but they have not been able to get that filled as of  yet. Patient does have a history of neuropathy, chronic kidney disease stage III, and hypertension along with chronic venous insufficiency otherwise she is fairly healthy and has nothing else that should affect her ability to heal. 01-14-2022 upon evaluation today patient appears to be doing well currently in regard to her wound. She has been tolerating the dressing changes without complication. With that being said she still has quite a bit of necrotic tissue buildup on the surface of the wound unfortunately. 01-21-2022 upon evaluation today patient actually is seeming to make excellent progress in regard to her wound. She is tolerating the dressing changes and the Iodoflex seems to be doing a great job at this point. Fortunately I do not see any signs of active infection at this time. [name] ([id number]) [id number] Page 2 of 6 Electronic Signature(s) Signed: 01/21/2022 3:19:44 PM By: Lenda Kelp PA-C Entered By: Lenda Kelp on 01/21/2022 15:19:44 -------------------------------------------------------------------------------- Physical Exam Details Patient Name: Date of Service: [name], [name] 01/21/2022 2:45 PM Medical Record Number: [id number] Patient Account Number: [id number] Date of Birth/Sex: Treating RN: [name] (88 y.o. [name] Primary Care Provider: Lynnea Fox Other Clinician: Referring Provider: Treating Provider/Extender: [name] in Treatment: 2 Constitutional Well-nourished and well-hydrated in no acute distress. Respiratory normal breathing without difficulty. Psychiatric this patient is able to make decisions and demonstrates good insight into disease process. Alert and Oriented x 3. pleasant and cooperative. Notes Upon inspection patient's wound bed actually showed signs of good granulation epithelization at this point. Fortunately I see no signs of worsening or  infection overall I think that she is actually doing quite well at this time. In general I think that we are headed in the right direction and I am hopeful that she will continue to make good progress here as well as we go forward. Electronic Signature(s) Signed: 01/21/2022 3:20:14  PM By: Lenda Kelp PA-C Entered By: Lenda Kelp on 01/21/2022 15:20:13 -------------------------------------------------------------------------------- Physician Orders Details Patient Name: Date of Service: [name], [name] 01/21/2022 2:45 PM Medical Record Number: [id number] Patient Account Number: [id number] Date of Birth/Sex: Treating RN: [name] (88 y.o. 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 Bathing/ Shower/ Hygiene Colleen shower with wound dressing protected with water repellent cover or cast protector. No tub bath. Anesthetic (Use 'Patient Medications' Section for Anesthetic Order Entry) Lidocaine applied to wound bed Edema Control - Lymphedema / Segmental Compressive Device / Other Optional: One layer of unna paste to top of compression wrap (to act as an anchor). Elevate, Exercise Daily and A void Standing for Long Periods of Time. Elevate legs to the level of the heart and pump ankles as often as possible Elevate leg(s) parallel to  the floor when sitting. Wound Treatment Wound #1 - Lower Leg Wound Laterality: Right, Lateral Cleanser: Wound Cleanser (Generic) 1 x Per Week/30 Days Discharge Instructions: Wash your hands with soap and water. Remove old dressing, discard into plastic bag and place into trash. Cleanse the wound with Wound Cleanser prior to applying a clean dressing using gauze sponges, not tissues or cotton balls. Do not scrub or use excessive force. Pat dry using gauze sponges, not tissue or cotton balls. Prim Dressing: IODOFLEX 0.9% Cadexomer Iodine Pad 1 x Per Week/30 Days ary Discharge Instructions: Apply Iodoflex to wound bed only as directed. Secondary Dressing: ABD Pad 5x9 (in/in) 1 x Per Week/30 Days Discharge Instructions: Cover with ABD pad Compression Wrap: 3-LAYER WRAP - Profore Lite LF 3 Multilayer Compression Bandaging System 1 x Per Week/30 Days Discharge Instructions: Apply 3 multi-layer wrap as prescribed. Wound #3 - Lower Leg Wound Laterality: Left, Lateral Prim Dressing: Xeroform-HBD 2x2 (in/in) 3 x Per Week/30 Days ary Discharge Instructions: Apply Xeroform-HBD 2x2 (in/in) as directed Secondary Dressing: Gauze 3 x Per Week/30 Days Discharge Instructions: As directed: dry, moistened with saline or moistened with Dakins Solution Secured With: [employer] Dressing, Latex-free, Size 5, Small-Head / Shoulder / Thigh 3 x Per Week/30 Days Electronic Signature(s) Signed: 01/21/2022 4:39:55 PM By: Yevonne Pax RN Signed: 01/21/2022 4:45:38 PM By: Lenda Kelp PA-C Entered By: Yevonne Pax on 01/21/2022 15:20:44 -------------------------------------------------------------------------------- Problem List Details Patient Name: Date of Service: [name] 01/21/2022 2:45 PM Medical Record Number: [id number] Patient Account Number: [id number] Date of Birth/Sex: Treating RN: [birth year] (88 y.o. [name] Primary Care Provider: Lynnea Fox Other Clinician: Referring  Provider: Treating Provider/Extender: [name] in Treatment: 2 [name], [name] ([id number]) [id number] Page 4 of 6 Active Problems ICD-10 Encounter Code Description Active Date MDM Diagnosis I87.331 Chronic venous hypertension (idiopathic) with ulcer and inflammation of right 01/07/2022 No Yes lower extremity G60.3 Idiopathic progressive neuropathy 01/07/2022 No Yes L97.812 Non-pressure chronic ulcer of other part of right lower leg with fat layer 01/07/2022 No Yes exposed N18.30 Chronic kidney disease, stage 3 unspecified 01/07/2022 No Yes I10 Essential (primary) hypertension 01/07/2022 No Yes Inactive  Problems Resolved Problems Electronic Signature(s) Signed: 01/21/2022 3:13:22 PM By: Lenda KelpStone III, Akeisha Lagerquist PA-C Entered By: Lenda KelpStone III, Humzah Harty on 01/21/2022 15:13:21 -------------------------------------------------------------------------------- Progress Note Details Patient Name: Date of Service: Colleen ScoreGERRINGER, [name] 01/21/2022 2:45 PM Medical Record Number: [id number] Patient Account Number: [id number] Date of Birth/Sex: Treating RN: [birth year] (88 y.o. Colleen FinnerF) Epps, [name] Primary Care Provider: Lynnea FerrierPickard, Warren Other Clinician: Referring Provider: Treating Provider/Extender: Colleen OreStone, Marvelle Span Pickard, Warren Fox in Treatment: 2 Subjective Chief Complaint Information obtained from Patient Right LE Ulcer History of Present Illness (HPI) 01-07-2022 upon evaluation today patient appears to be doing poorly in regard to the wound on the right lateral lower extremity. She tells me that this occurred after a fall that she sustained on and around Labor Day weekend. Subsequently following this fall she tells me that she had a wound that really has not improved significantly. She has been seeing her primary care provider and they have been attempting to do what they could to get this improving overall. With that being said the patient has been on  Silvadene cream, Neosporin, and more recently soaking with peroxide then wet to dry gauze recommended by primary care provider. Lastly Santyl was advised but they have not been able to get that filled as of yet. Patient does have a history of neuropathy, chronic kidney disease stage III, and hypertension along with chronic venous insufficiency otherwise she is fairly healthy and has nothing else that should affect her ability to heal. 01-14-2022 upon evaluation today patient appears to be doing well currently in regard to her wound. She has been tolerating the dressing changes without Colleen ScoreGERRINGER, Emagene L ([id number]) [id number] Page 5 of 6 complication. With that being said she still has quite a bit of necrotic tissue buildup on the surface of the wound unfortunately. 01-21-2022 upon evaluation today patient actually is seeming to make excellent progress in regard to her wound. She is tolerating the dressing changes and the Iodoflex seems to be doing a great job at this point. Fortunately I do not see any signs of active infection at this time. Objective Constitutional Well-nourished and well-hydrated in no acute distress. Vitals Time Taken: 2:59 PM, Height: 63 in, Weight: 140 lbs, BMI: 24.8, Temperature: 98.3 F, Pulse: 80 bpm, Respiratory Rate: 18 breaths/min, Blood Pressure: 177/82 mmHg. Respiratory normal breathing without difficulty. Psychiatric this patient is able to make decisions and demonstrates good insight into disease process. Alert and Oriented x 3. pleasant and cooperative. General Notes: Upon inspection patient's wound bed actually showed signs of good granulation epithelization at this point. Fortunately I see no signs of worsening or infection overall I think that she is actually doing quite well at this time. In general I think that we are headed in the right direction and I am hopeful that she will continue to make good progress here as well as we  go forward. Integumentary (Hair, Skin) Wound #1 status is Open. Original cause of wound was Gradually Appeared. The date acquired was: 10/05/2021. The wound has been in treatment 2 Fox. The wound is located on the Right,Lateral Lower Leg. The wound measures 3.5cm length x 2.5cm width x 0.2cm depth; 6.872cm^2 area and 1.374cm^3 volume. There is Fat Layer (Subcutaneous Tissue) exposed. There is no tunneling or undermining noted. There is a medium amount of serosanguineous drainage noted. There is medium (34-66%) red granulation within the wound bed. There is a medium (34-66%) amount of necrotic tissue within the wound bed including Adherent Slough. Wound #2 status is Open. Original cause of  wound was Trauma. The date acquired was: 01/14/2022. The wound has been in treatment 1 Fox. The wound is located on the Proximal Lower Leg. The wound measures 0cm length x 0cm width x 0cm depth; 0cm^2 area and 0cm^3 volume. There is no tunneling or undermining noted. There is a none present amount of drainage noted. The wound margin is flat and intact. There is no granulation within the wound bed. There is no necrotic tissue within the wound bed. Wound #3 status is Open. Original cause of wound was Skin T ear/Laceration. The date acquired was: 01/21/2022. The wound is located on the Left,Lateral Lower Leg. The wound measures 4cm length x 0.8cm width x 0.1cm depth; 2.513cm^2 area and 0.251cm^3 volume. There is Fat Layer (Subcutaneous Tissue) exposed. There is no tunneling or undermining noted. There is a medium amount of serosanguineous drainage noted. There is medium (34-66%) pink granulation within the wound bed. There is a medium (34-66%) amount of necrotic tissue within the wound bed including Adherent Slough. Assessment Active Problems ICD-10 Chronic venous hypertension (idiopathic) with ulcer and inflammation of right lower extremity Idiopathic progressive neuropathy Non-pressure chronic ulcer of other  part of right lower leg with fat layer exposed Chronic kidney disease, stage 3 unspecified Essential (primary) hypertension Plan 1. Based on what I am seeing I would recommend that we continue with the Iodoflex for the right leg this seems to be doing quite well. 2. I am also can recommend Xeroform and gauze with instruction on the left leg. 3. I would also suggest the patient should continue to elevate her legs is much as possible to help with edema control. Obviously the more that she can do the better. We will see patient back for reevaluation in 1 week here in the clinic. If anything worsens or changes patient will contact our office for additional recommendations. Electronic Signature(s) Signed: 01/21/2022 3:20:43 PM By: Lenda Kelp PA-C Entered By: Lenda Kelp on 01/21/2022 15:20:42 [name] ([id number]) [id number] Page 6 of 6 -------------------------------------------------------------------------------- SuperBill Details Patient Name: Date of Service: [name], [name] 01/21/2022 Medical Record Number: [id number] Patient Account Number: [id number] Date of Birth/Sex: Treating RN: [birth year] (88 y.o. [name] Primary Care Provider: Lynnea Fox Other Clinician: Referring Provider: Treating Provider/Extender: [name] in Treatment: 2 Diagnosis Coding ICD-10 Codes Code Description [phone] Chronic venous hypertension (idiopathic) with ulcer and inflammation of right lower extremity G60.3 Idiopathic progressive neuropathy L97.812 Non-pressure chronic ulcer of other part of right lower leg with fat layer exposed N18.30 Chronic kidney disease, stage 3 unspecified I10 Essential (primary) hypertension Facility Procedures : CPT4 Code: [id number] Description: (Facility Use Only) [phone] - APPLY MULTLAY COMPRS LWR RT LEG Modifier: Quantity: 1 Physician Procedures : CPT4 Code Description Modifier  [id number] - WC PHYS LEVEL 3 - EST PT ICD-10 Diagnosis Description I87.331 Chronic venous hypertension (idiopathic) with ulcer and inflammation of right lower extremity G60.3 Idiopathic progressive neuropathy L97.812  Non-pressure chronic ulcer of other part of right lower leg with fat layer exposed N18.30 Chronic kidney disease, stage 3 unspecified Quantity: 1 Electronic Signature(s) Signed: 01/21/2022 4:36:20 PM By: Yevonne Pax RN Signed: 01/21/2022 4:45:38 PM By: Lenda Kelp PA-C Previous Signature: 01/21/2022 3:20:56 PM Version By: Lenda Kelp PA-C Entered By: Yevonne Pax on 01/21/2022 16:36:19

## 2022-01-21 NOTE — Progress Notes (Signed)
EVANGELINA, DELANCEY ([id number]) [id number] Page 1 of 11 Visit Report for 01/21/2022 Arrival Information Details Patient Name: Date of Service: [name], [name] 01/21/2022 2:45 PM Medical Record Number: [id number] Patient Account Number: [id number] Date of Birth/Sex: Treating RN: 02-Sep-1931 (88 y.o. [name] Primary Care Teige Rountree: [name] Other Clinician: Referring Jennalee Greaves: Treating Taisha Pennebaker/Extender: Trula Ore in Treatment: 2 Visit Information History Since Last Visit Added or deleted any medications: No Patient Arrived: Ambulatory Any new allergies or adverse reactions: No Arrival Time: 14:52 Had a fall or experienced change in No Accompanied By: son activities of daily living that may affect Transfer Assistance: None risk of falls: Patient Identification Verified: Yes Signs or symptoms of abuse/neglect since last visito No Secondary Verification Process Completed: Yes Hospitalized since last visit: No Patient Requires Transmission-Based Precautions: No Implantable device outside of the clinic excluding No Patient Has Alerts: No cellular tissue based products placed in the center since last visit: Has Dressing in Place as Prescribed: Yes Has Compression in Place as Prescribed: Yes Pain Present Now: No Electronic Signature(s) Signed: 01/21/2022 4:39:55 PM By: Yevonne Pax RN Entered By: Yevonne Pax on 01/21/2022 14:59:36 -------------------------------------------------------------------------------- Clinic Level of Care Assessment Details Patient Name: Date of Service: [name], [name] 01/21/2022 2:45 PM Medical Record Number: [id number] Patient Account Number: [id number] Date of Birth/Sex: Treating RN: 09-24-1931 (88 y.o. [name] Primary Care Daleen Steinhaus: [name] Other Clinician: Referring Annaya Bangert: Treating Monik Lins/Extender: Trula Ore in Treatment: 2 Clinic  Level of Care Assessment Items TOOL 1 Quantity Fox []  - 0 Use when EandM and Procedure is performed on INITIAL visit ASSESSMENTS - Nursing Assessment / Reassessment []  - 0 General Physical Exam (combine w/ comprehensive assessment (listed just below) when performed on new pt. evals) []  - 0 Comprehensive Assessment (HX, ROS, Risk Assessments, Wounds Hx, etc.) LANEAH, LUFT ( ) [id number] Page 2 of 11 ASSESSMENTS - Wound and Skin Assessment / Reassessment []  - 0 Dermatologic / Skin Assessment (not related to wound area) ASSESSMENTS - Ostomy and/or Continence Assessment and Care []  - 0 Incontinence Assessment and Management []  - 0 Ostomy Care Assessment and Management (repouching, etc.) PROCESS - Coordination of Care []  - 0 Simple Patient / Family Education for ongoing care []  - 0 Complex (extensive) Patient / Family Education for ongoing care []  - 0 Staff obtains [name], Records, T Results / Process Orders est []  - 0 Staff telephones HHA, Nursing Homes / Clarify orders / etc []  - 0 Routine Transfer to another Facility (non-emergent condition) []  - 0 Routine Hospital Admission (non-emergent condition) []  - 0 New Admissions / [id number] / Ordering NPWT Apligraf, etc. , []  - 0 Emergency Hospital Admission (emergent condition) PROCESS - Special Needs []  - 0 Pediatric / Minor Patient Management []  - 0 Isolation Patient Management []  - 0 Hearing / Language / Visual special needs []  - 0 Assessment of Community assistance (transportation, D/C planning, etc.) []  - 0 Additional assistance / Altered mentation []  - 0 Support Surface(s) Assessment (bed, cushion, seat, etc.) INTERVENTIONS - Miscellaneous []  - 0 External ear exam []  - 0 Patient Transfer (multiple staff / 12-22-1995 / Similar devices) []  - 0 Simple Staple / Suture removal (25 or less) []  - 0 Complex Staple / Suture removal (26 or more) []  -  0 Hypo/Hyperglycemic Management (do not check if billed separately) []  - 0 Ankle / Brachial Index (ABI) - do not check if billed separately Has the patient been seen at the  hospital within the last three years: Yes Total Fox: 0 Level Of Care: ____ Electronic Signature(s) Signed: 01/21/2022 4:39:55 PM By: Yevonne PaxEpps, [name] RN Entered By: Yevonne PaxEpps, [name] on 01/21/2022 16:35:51 -------------------------------------------------------------------------------- Compression Therapy Details Patient Name: Date of Service: [name], [name] 01/21/2022 2:45 PM Medical Record Number: [id number] Patient Account Number: [id number] Date of Birth/Sex: Treating RN: 03/13/1931 (88 y.o. [name]) Epps, [name] Primary Care Alferd Obryant: Lynnea FerrierPickard, Warren Other Clinician: Referring Rosario Kushner: Treating Kary Colaizzi/Extender: Wilma FlavinStone, Hoyt Pickard, Warren Indian CreekGERRINGER, Concha PyoBETTY L ([id number]) [id number] Page 3 of 11 Weeks in Treatment: 2 Compression Therapy Performed for Wound Assessment: Wound #1 Right,Lateral Lower Leg Performed By: Clinician Yevonne PaxEpps, [name], RN Compression Type: Three Layer Post Procedure Diagnosis Same as Pre-procedure Electronic Signature(s) Signed: 01/21/2022 4:30:10 PM By: Yevonne PaxEpps, [name] RN Entered By: Yevonne PaxEpps, [name] on 01/21/2022 16:30:10 -------------------------------------------------------------------------------- Encounter Discharge Information Details Patient Name: Date of Service: [name], [name] 01/21/2022 2:45 PM Medical Record Number: [id number] Patient Account Number: [id number] Date of Birth/Sex: Treating RN: 10/15/1931 (88 y.o. [name]) Epps, [name] Primary Care Richerd Grime: Lynnea FerrierPickard, Warren Other Clinician: Referring Obinna Ehresman: Treating Kaija Kovacevic/Extender: Trula OreStone, Hoyt Pickard, Warren Weeks in Treatment: 2 Encounter Discharge Information Items Discharge Condition: Stable Ambulatory Status: Walker Discharge Destination: Home Transportation: Private Auto Accompanied By:  son Schedule Follow-up Appointment: Yes Clinical Summary of Care: Electronic Signature(s) Signed: 01/21/2022 4:37:27 PM By: Yevonne PaxEpps, [name] RN Entered By: Yevonne PaxEpps, [name] on 01/21/2022 16:37:27 -------------------------------------------------------------------------------- Lower Extremity Assessment Details Patient Name: Date of Service: [name], [name] 01/21/2022 2:45 PM Medical Record Number: [id number] Patient Account Number: [id number] Date of Birth/Sex: Treating RN: 02/05/1931 (88 y.o. [name]) Epps, [name] Primary Care Ledia Hanford: Lynnea FerrierPickard, Warren Other Clinician: Referring Jasdeep Kepner: Treating Konner Warrior/Extender: Trula OreStone, Hoyt Pickard, Warren Weeks in Treatment: 2 Edema Assessment G[Left: Lucien MonsRRINGER, Sahara L ([id number])]([id number])] [Right: [id number] Page 4 of 11] Assessed: [Left: No] [Right: No] [Left: Edema] [Right: :] Calf Left: Right: Point of Measurement: 32 cm From Medial Instep 34 cm Ankle Left: Right: Point of Measurement: 11 cm From Medial Instep 22 cm Vascular Assessment Pulses: Dorsalis Pedis Palpable: [Right:Yes] Electronic Signature(s) Signed: 01/21/2022 4:39:55 PM By: Yevonne PaxEpps, [name] RN Entered By: Yevonne PaxEpps, [name] on 01/21/2022 15:10:18 -------------------------------------------------------------------------------- Multi Wound Chart Details Patient Name: Date of Service: [name], [name] 01/21/2022 2:45 PM Medical Record Number: [id number] Patient Account Number: [id number] Date of Birth/Sex: Treating RN: 05/07/1931 (88 y.o. [name]) Epps, [name] Primary Care Blaise Palladino: Lynnea FerrierPickard, Warren Other Clinician: Referring Evelynn Hench: Treating Donley Harland/Extender: Wilma FlavinStone, Hoyt Pickard, Warren Weeks in Treatment: 2 Vital Signs Height(in): 63 Pulse(bpm): 80 Weight(lbs): 140 Blood Pressure(mmHg): 177/82 Body Mass Index(BMI): 24.8 Temperature(F): 98.3 Respiratory Rate(breaths/min): 18 [1:Photos:] [2:No Photos] [N/A:N/A] Right, Lateral Lower Leg Proximal Lower Leg  N/A Wound Location: Gradually Appeared Trauma N/A Wounding Event: Venous Leg Ulcer Skin Tear N/A Primary Etiology: Hypertension Hypertension N/A Comorbid History: 10/05/2021 01/14/2022 N/A Date Acquired: 2 1 N/A Weeks of Treatment: Open Open N/A Wound Status: No No N/A Wound Recurrence: 3.5x2.5x0.2 0x0x0 N/A Measurements L x W x D (cm) 6.872 0 N/A A (cm) : rea 1.374 0 N/A Volume (cm) : 56.10% 100.00% N/A % Reduction in A reaErasmo Fox: Ciaramitaro, Charlisa L ([id number]) [id number] Page 5 of 11 56.20% 100.00% N/A % Reduction in Volume: Full Thickness Without Exposed Partial Thickness N/A Classification: Support Structures Medium None Present N/A Exudate Amount: Serosanguineous N/A N/A Exudate Type: red, brown N/A N/A Exudate Color: N/A Flat and Intact N/A Wound Margin: Medium (34-66%) None Present (0%) N/A Granulation Amount: Red N/A N/A Granulation Quality: Medium (34-66%) None Present (0%) N/A Necrotic Amount: Fat Layer (Subcutaneous Tissue): Yes Fascia: No  N/A Exposed Structures: Fascia: No Fat Layer (Subcutaneous Tissue): No Tendon: No Tendon: No Muscle: No Muscle: No Joint: No Joint: No Bone: No Bone: No None Large (67-100%) N/A Epithelialization: Treatment Notes Electronic Signature(s) Signed: 01/21/2022 4:39:55 PM By: Yevonne Pax RN Entered By: Yevonne Pax on 01/21/2022 15:14:52 -------------------------------------------------------------------------------- Multi-Disciplinary Care Plan Details Patient Name: Date of Service: [name] 01/21/2022 2:45 PM Medical Record Number: [id number] Patient Account Number: [id number] Date of Birth/Sex: Treating RN: [birth year] (88 y.o. [name] Primary Care Lesleyann Fichter: [name] Other Clinician: Referring Jshon Ibe: Treating Lorren Rossetti/Extender: Trula Ore in Treatment: 2 Active Inactive Abuse / Safety / Falls / Self Care Management Nursing  Diagnoses: Potential for injury related to falls Goals: Patient will remain injury free related to falls Date Initiated: 01/07/2022 Target Resolution Date: 02/07/2022 Goal Status: Active Interventions: Assess Activities of Daily Living upon admission and as needed Assess fall risk on admission and as needed Assess: immobility, friction, shearing, incontinence upon admission and as needed Assess impairment of mobility on admission and as needed per policy Assess personal safety and home safety (as indicated) on admission and as needed Assess self care needs on admission and as needed Notes: Wound/Skin Impairment Nursing Diagnoses: Knowledge deficit related to ulceration/compromised skin integrity GoalsKYLA, DUFFY ([id number]) [id number] Page 6 of 11 Patient/caregiver will verbalize understanding of skin care regimen Date Initiated: 01/07/2022 Target Resolution Date: 02/07/2022 Goal Status: Active Ulcer/skin breakdown will have a volume reduction of 30% by week 4 Date Initiated: 01/07/2022 Target Resolution Date: 02/07/2022 Goal Status: Active Ulcer/skin breakdown will have a volume reduction of 50% by week 8 Date Initiated: 01/07/2022 Target Resolution Date: 03/10/2022 Goal Status: Active Ulcer/skin breakdown will have a volume reduction of 80% by week 12 Date Initiated: 01/07/2022 Target Resolution Date: 04/08/2022 Goal Status: Active Ulcer/skin breakdown will heal within 14 weeks Date Initiated: 01/07/2022 Target Resolution Date: 05/10/2022 Goal Status: Active Interventions: Assess patient/caregiver ability to obtain necessary supplies Assess patient/caregiver ability to perform ulcer/skin care regimen upon admission and as needed Assess ulceration(s) every visit Notes: Electronic Signature(s) Signed: 01/21/2022 4:36:40 PM By: Yevonne Pax RN Entered By: Yevonne Pax on 01/21/2022  16:36:40 -------------------------------------------------------------------------------- Pain Assessment Details Patient Name: Date of Service: [name], [name] 01/21/2022 2:45 PM Medical Record Number: [id number] Patient Account Number: [id number] Date of Birth/Sex: Treating RN: 1932-02-02 (88 y.o. [name] Primary Care Jonnell Hentges: [name] Other Clinician: Referring Amamda Curbow: Treating Norena Bratton/Extender: Wilma Flavin Weeks in Treatment: 2 Active Problems Location of Pain Severity and Description of Pain Patient Has Paino No Site Locations Pain Management and Medication Current Pain Management: [name], [name] ([id number]) [id number] Page 7 of 11 Electronic Signature(s) Signed: 01/21/2022 4:39:55 PM By: Yevonne Pax RN Entered By: Yevonne Pax on 01/21/2022 15:00:17 -------------------------------------------------------------------------------- Patient/Caregiver Education Details Patient Name: Date of Service: [name] 12/18/2023andnbsp2:45 PM Medical Record Number: [id number] Patient Account Number: [id number] Date of Birth/Gender: Treating RN: 1931-08-12 (88 y.o. [name] Primary Care Physician: [name] Other Clinician: Referring Physician: Treating Physician/Extender: Trula Ore in Treatment: 2 Education Assessment Education Provided To: Patient Education Topics Provided Wound/Skin Impairment: Methods: Explain/Verbal Responses: State content correctly Electronic Signature(s) Signed: 01/21/2022 4:39:55 PM By: Yevonne Pax RN Entered By: Yevonne Pax on 01/21/2022 16:36:31 -------------------------------------------------------------------------------- Wound Assessment Details Patient Name: Date of Service: [name], [name] 01/21/2022 2:45 PM Medical Record Number: [id number] Patient Account Number: [id number] Date of Birth/Sex: Treating RN: Mar 18, 1931 (88  y.o. [name] Primary Care Blasa Raisch: [name]  Other Clinician: Referring Jamiria Langill: Treating Rochella Benner/Extender: Trula Ore in Treatment: 2 Wound Status Wound Number: 1 Primary Etiology: Venous Leg Ulcer Wound Location: Right, Lateral Lower Leg Wound Status: Open Wounding Event: Gradually Appeared Comorbid History: Hypertension Date Acquired: 10/05/2021 Weeks Of Treatment: 2 [name] ([id number]) [id number] Page 8 of 11 Clustered Wound: No Photos Wound Measurements Length: (cm) 3.5 Width: (cm) 2.5 Depth: (cm) 0.2 Area: (cm) 6.872 Volume: (cm) 1.374 % Reduction in Area: 56.1% % Reduction in Volume: 56.2% Epithelialization: None Tunneling: No Undermining: No Wound Description Classification: Full Thickness Without Exposed Support Structures Exudate Amount: Medium Exudate Type: Serosanguineous Exudate Color: red, brown Foul Odor After Cleansing: No Slough/Fibrino Yes Wound Bed Granulation Amount: Medium (34-66%) Exposed Structure Granulation Quality: Red Fascia Exposed: No Necrotic Amount: Medium (34-66%) Fat Layer (Subcutaneous Tissue) Exposed: Yes Necrotic Quality: Adherent Slough Tendon Exposed: No Muscle Exposed: No Joint Exposed: No Bone Exposed: No Treatment Notes Wound #1 (Lower Leg) Wound Laterality: Right, Lateral Cleanser Wound Cleanser Discharge Instruction: Wash your hands with soap and water. Remove old dressing, discard into plastic bag and place into trash. Cleanse the wound with Wound Cleanser prior to applying a clean dressing using gauze sponges, not tissues or cotton balls. Do not scrub or use excessive force. Pat dry using gauze sponges, not tissue or cotton balls. Peri-Wound Care Topical Primary Dressing IODOFLEX 0.9% Cadexomer Iodine Pad Discharge Instruction: Apply Iodoflex to wound bed only as directed. Secondary Dressing ABD Pad 5x9 (in/in) Discharge Instruction:  Cover with ABD pad Secured With Compression Wrap 3-LAYER WRAP - Profore Lite LF 3 Multilayer Compression Bandaging System Discharge Instruction: Apply 3 multi-layer wrap as prescribed. Compression Stockings Add-Ons Electronic Signature(s) Signed: 01/21/2022 4:39:55 PM By: Yevonne Pax RN Entered By: Yevonne Pax on 01/21/2022 15:09:31 [name] ([id number]) [id number] Page 9 of 11 -------------------------------------------------------------------------------- Wound Assessment Details Patient Name: Date of Service: [name], [name] 01/21/2022 2:45 PM Medical Record Number: [id number] Patient Account Number: [id number] Date of Birth/Sex: Treating RN: September 16, 1931 (88 y.o. [name] Primary Care Darcelle Herrada: [name] Other Clinician: Referring Lakethia Coppess: Treating Violeta Lecount/Extender: Wilma Flavin Weeks in Treatment: 2 Wound Status Wound Number: 2 Primary Etiology: Skin Tear Wound Location: Proximal Lower Leg Wound Status: Open Wounding Event: Trauma Comorbid History: Hypertension Date Acquired: 01/14/2022 Weeks Of Treatment: 1 Clustered Wound: No Wound Measurements Length: (cm) Width: (cm) Depth: (cm) Area: (cm) Volume: (cm) 0 % Reduction in Area: 100% 0 % Reduction in Volume: 100% 0 Epithelialization: Large (67-100%) 0 Tunneling: No 0 Undermining: No Wound Description Classification: Partial Thickness Wound Margin: Flat and Intact Exudate Amount: None Present Foul Odor After Cleansing: No Slough/Fibrino No Wound Bed Granulation Amount: None Present (0%) Exposed Structure Necrotic Amount: None Present (0%) Fascia Exposed: No Fat Layer (Subcutaneous Tissue) Exposed: No Tendon Exposed: No Muscle Exposed: No Joint Exposed: No Bone Exposed: No Electronic Signature(s) Signed: 01/21/2022 4:39:55 PM By: Yevonne Pax RN Entered By: Yevonne Pax on 01/21/2022  15:09:55 -------------------------------------------------------------------------------- Wound Assessment Details Patient Name: Date of Service: [name], KRETZ 01/21/2022 2:45 PM Medical Record Number: [id number] Patient Account Number: [id number] Date of Birth/Sex: Treating RN: 11-Dec-1931 (88 y.o. Caidynce, Muzyka, Ducor Georgia ([id number]) [phone].pdf Page 10 of 11 Primary Care Ereka Brau: [name] Other Clinician: Referring Johanna Stafford: Treating Makyia Erxleben/Extender: Wilma Flavin Weeks in Treatment: 2 Wound Status Wound Number: 3 Primary Etiology: Skin Tear Wound Location: Left, Lateral Lower Leg Wound Status: Open Wounding Event: Skin Tear/Laceration Comorbid History: Hypertension Date Acquired: 01/21/2022 Weeks Of  Treatment: 0 Clustered Wound: No Wound Measurements Length: (cm) 4 Width: (cm) 0.8 Depth: (cm) 0.1 Area: (cm) 2.513 Volume: (cm) 0.251 % Reduction in Area: % Reduction in Volume: Epithelialization: None Tunneling: No Undermining: No Wound Description Classification: Full Thickness Without Exposed Suppor Exudate Amount: Medium Exudate Type: Serosanguineous Exudate Color: red, brown t Structures Foul Odor After Cleansing: No Slough/Fibrino Yes Wound Bed Granulation Amount: Medium (34-66%) Exposed Structure Granulation Quality: Pink Fascia Exposed: No Necrotic Amount: Medium (34-66%) Fat Layer (Subcutaneous Tissue) Exposed: Yes Necrotic Quality: Adherent Slough Tendon Exposed: No Muscle Exposed: No Joint Exposed: No Bone Exposed: No Treatment Notes Wound #3 (Lower Leg) Wound Laterality: Left, Lateral Cleanser Peri-Wound Care Topical Primary Dressing Xeroform-HBD 2x2 (in/in) Discharge Instruction: Apply Xeroform-HBD 2x2 (in/in) as directed Secondary Dressing Gauze Discharge Instruction: As directed: dry, moistened with saline or moistened with Dakins Solution Secured With [employer] Food Dressing,  Latex-free, Size 5, Small-Head / Shoulder / Thigh Compression Wrap Compression Stockings Add-Ons Electronic Signature(s) Signed: 01/21/2022 4:39:55 PM By: Yevonne Pax RN Entered By: Yevonne Pax on 01/21/2022 15:19:23 [name] ([id number]) [id number] Page 11 of 11 -------------------------------------------------------------------------------- Vitals Details Patient Name: Date of Service: LAIA, WILEY 01/21/2022 2:45 PM Medical Record Number: [id number] Patient Account Number: [id number] Date of Birth/Sex: Treating RN: [birth year] (88 y.o. [name] Primary Care Kaedyn Polivka: [name] Other Clinician: Referring Keiran Gaffey: Treating Mael Delap/Extender: Trula Ore in Treatment: 2 Vital Signs Time Taken: 14:59 Temperature (F): 98.3 Height (in): 63 Pulse (bpm): 80 Weight (lbs): 140 Respiratory Rate (breaths/min): 18 Body Mass Index (BMI): 24.8 Blood Pressure (mmHg): 177/82 Reference Range: 80 - 120 mg / dl Electronic Signature(s) Signed: 01/21/2022 4:39:55 PM By: Yevonne Pax RN Entered By: Yevonne Pax on 01/21/2022 15:00:11

## 2022-01-29 ENCOUNTER — Encounter: Payer: Medicare Other | Admitting: Internal Medicine

## 2022-01-29 DIAGNOSIS — I87331 Chronic venous hypertension (idiopathic) with ulcer and inflammation of right lower extremity: Secondary | ICD-10-CM | POA: Diagnosis not present

## 2022-01-29 DIAGNOSIS — L97812 Non-pressure chronic ulcer of other part of right lower leg with fat layer exposed: Secondary | ICD-10-CM | POA: Diagnosis not present

## 2022-01-29 DIAGNOSIS — I872 Venous insufficiency (chronic) (peripheral): Secondary | ICD-10-CM | POA: Diagnosis not present

## 2022-01-29 DIAGNOSIS — N183 Chronic kidney disease, stage 3 unspecified: Secondary | ICD-10-CM | POA: Diagnosis not present

## 2022-01-29 DIAGNOSIS — I129 Hypertensive chronic kidney disease with stage 1 through stage 4 chronic kidney disease, or unspecified chronic kidney disease: Secondary | ICD-10-CM | POA: Diagnosis not present

## 2022-01-29 DIAGNOSIS — G603 Idiopathic progressive neuropathy: Secondary | ICD-10-CM | POA: Diagnosis not present

## 2022-01-29 NOTE — Progress Notes (Signed)
[name], [name] ([id number]) [phone].pdf Page 1 of 6 Visit Report for 01/29/2022 HPI Details Patient Name: Date of Service: [name], [name] 01/29/2022 1:15 PM Medical Record Number: [id number] Patient Account Number: [id number] Date of Birth/Sex: Treating RN: [birth year] (88 y.o. [name] Primary Care Provider: [name] Other Clinician: Referring Provider: Treating Provider/Extender: Chauncey Mann, [name] in Treatment: 3 History of Present Illness HPI Description: 01-07-2022 upon evaluation today patient appears to be doing poorly in regard to the wound on the right lateral lower extremity. She tells me that this occurred after a fall that she sustained on and around Labor Day weekend. Subsequently following this fall she tells me that she had a wound that really has not improved significantly. She has been seeing her primary care provider and they have been attempting to do what they could to get this improving overall. With that being said the patient has been on Silvadene cream, Neosporin, and more recently soaking with peroxide then wet to dry gauze recommended by primary care provider. Lastly Santyl was advised but they have not been able to get that filled as of yet. Patient does have a history of neuropathy, chronic kidney disease stage III, and hypertension along with chronic venous insufficiency otherwise she is fairly healthy and has nothing else that should affect her ability to heal. 01-14-2022 upon evaluation today patient appears to be doing well currently in regard to her wound. She has been tolerating the dressing changes without complication. With that being said she still has quite a bit of necrotic tissue buildup on the surface of the wound unfortunately. 01-21-2022 upon evaluation today patient actually is seeming to make excellent progress in regard to her wound. She is tolerating the dressing changes and  the Iodoflex seems to be doing a great job at this point. Fortunately I do not see any signs of active infection at this time. 12/26; wound on the right lateral lower leg which was initially trauma is quite a bit better down to half a centimeter in both dimensions. It has no depth. We are using Iodoflex under 3 layer compression. On the left leg she has a skin tear we have been using Xeroform this also looks quite good. The patient probably has chronic venous insufficiency although she also takes prednisone 10 mg a day adding to the overall fragility of her skin Electronic Signature(s) Signed: 01/29/2022 4:30:46 PM By: Baltazar Najjar MD Entered By: Baltazar Najjar on 01/29/2022 13:52:19 -------------------------------------------------------------------------------- Physical Exam Details Patient Name: Date of Service: [name], [name] 01/29/2022 1:15 PM Medical Record Number: [id number] Patient Account Number: [id number] Date of Birth/Sex: Treating RN: 1931-11-01 (88 y.o. [name] Primary Care Provider: [name] Other Clinician: Referring Provider: Treating Provider/Extender: RO BSO N, [name] in Treatment: 3 Constitutional Patient is hypertensive.. Pulse regular and within target range for patient.Marland Kitchen Respirations regular, non-labored and within target range.. Temperature is normal and within the target range for the patient.Marland Kitchen appears in no distress. [name], [name] ([id number]) [phone].pdf Page 2 of 6 Notes Wound exam; patient's wound under illumination looks quite healthy there is some debris on the surface but with the improvement in wound dimensions no debridement was done. Circumference has healthy looking epithelialization. Her edema control is adequate. Electronic Signature(s) Signed: 01/29/2022 4:30:46 PM By: Baltazar Najjar MD Entered By: Baltazar Najjar on 01/29/2022  13:53:00 -------------------------------------------------------------------------------- Physician Orders Details Patient Name: Date of Service: [name] 01/29/2022 1:15 PM Medical Record  Number: [id number] Patient Account Number: [id number] Date of Birth/Sex: Treating RN: 1931-07-26 (88 y.o. [name] Primary Care Provider: [name] Other Clinician: Referring Provider: Treating Provider/Extender: RO BSO N, [name] in Treatment: 3 Verbal / Phone Orders: No Diagnosis Coding Follow-up Appointments Return Appointment in 1 week. Bathing/ Shower/ Hygiene May shower with wound dressing protected with water repellent cover or cast protector. No tub bath. Anesthetic (Use 'Patient Medications' Section for Anesthetic Order Entry) Lidocaine applied to wound bed Edema Control - Lymphedema / Segmental Compressive Device / Other Optional: One layer of unna paste to top of compression wrap (to act as an anchor). Elevate, Exercise Daily and A void Standing for Long Periods of Time. Elevate legs to the level of the heart and pump ankles as often as possible Elevate leg(s) parallel to the floor when sitting. Wound Treatment Wound #1 - Lower Leg Wound Laterality: Right, Lateral Cleanser: Wound Cleanser (Generic) 1 x Per Week/30 Days Discharge Instructions: Wash your hands with soap and water. Remove old dressing, discard into plastic bag and place into trash. Cleanse the wound with Wound Cleanser prior to applying a clean dressing using gauze sponges, not tissues or cotton balls. Do not scrub or use excessive force. Pat dry using gauze sponges, not tissue or cotton balls. Prim Dressing: IODOFLEX 0.9% Cadexomer Iodine Pad 1 x Per Week/30 Days ary Discharge Instructions: Apply Iodoflex to wound bed only as directed. Secondary Dressing: ABD Pad 5x9 (in/in) 1 x Per Week/30 Days Discharge Instructions: Cover with ABD pad Compression Wrap: 3-LAYER WRAP -  Profore Lite LF 3 Multilayer Compression Bandaging System 1 x Per Week/30 Days Discharge Instructions: Apply 3 multi-layer wrap as prescribed. Wound #3 - Lower Leg Wound Laterality: Left, Lateral Prim Dressing: Xeroform-HBD 2x2 (in/in) 3 x Per Week/30 Days ary Discharge Instructions: Apply Xeroform-HBD 2x2 (in/in) as directed Secondary Dressing: Gauze 3 x Per Week/30 Days Discharge Instructions: As directed: dry, moistened with saline or moistened with Dakins Solution Secured With: Stretch Net Dressing, Latex-free, Size 5, Small-Head / Shoulder / Thigh 3 x Per Week/30 Days SAMIA, KUKLA ([id number]) [id number] Page 3 of 6 Electronic Signature(s) Signed: 01/29/2022 4:30:46 PM By: Baltazar Najjar MD Signed: 01/29/2022 4:50:51 PM By: Yevonne Pax RN Entered By: Yevonne Pax on 01/29/2022 13:46:05 -------------------------------------------------------------------------------- Problem List Details Patient Name: Date of Service: AARYA, ROBINSON 01/29/2022 1:15 PM Medical Record Number: [id number] Patient Account Number: [id number] Date of Birth/Sex: Treating RN: Apr 12, 1931 (88 y.o. [name] Primary Care Provider: [name] Other Clinician: Referring Provider: Treating Provider/Extender: Chauncey Mann, [name] in Treatment: 3 Active Problems ICD-10 Encounter Code Description Active Date MDM Diagnosis I87.331 Chronic venous hypertension (idiopathic) with ulcer and inflammation of right 01/07/2022 No Yes lower extremity G60.3 Idiopathic progressive neuropathy 01/07/2022 No Yes L97.812 Non-pressure chronic ulcer of other part of right lower leg with fat layer 01/07/2022 No Yes exposed N18.30 Chronic kidney disease, stage 3 unspecified 01/07/2022 No Yes I10 Essential (primary) hypertension 01/07/2022 No Yes Inactive Problems Resolved Problems Electronic Signature(s) Signed: 01/29/2022 4:30:46 PM By: Baltazar Najjar  MD Entered By: Baltazar Najjar on 01/29/2022 13:51:24 [name] ([id number]) [id number] Page 4 of 6 -------------------------------------------------------------------------------- Progress Note Details Patient Name: Date of Service: [name], [name] 01/29/2022 1:15 PM Medical Record Number: [id number] Patient Account Number: [id number] Date of Birth/Sex: Treating RN: 01/06/1932 (88 y.o. [name] Primary Care Provider: [name] Other Clinician: Referring Provider: Treating Provider/Extender: RO BSO N, MICHA EL G  Lynnea FerrierPickard, Warren Weeks in Treatment: 3 Subjective History of Present Illness (HPI) 01-07-2022 upon evaluation today patient appears to be doing poorly in regard to the wound on the right lateral lower extremity. She tells me that this occurred after a fall that she sustained on and around Labor Day weekend. Subsequently following this fall she tells me that she had a wound that really has not improved significantly. She has been seeing her primary care provider and they have been attempting to do what they could to get this improving overall. With that being said the patient has been on Silvadene cream, Neosporin, and more recently soaking with peroxide then wet to dry gauze recommended by primary care provider. Lastly Santyl was advised but they have not been able to get that filled as of yet. Patient does have a history of neuropathy, chronic kidney disease stage III, and hypertension along with chronic venous insufficiency otherwise she is fairly healthy and has nothing else that should affect her ability to heal. 01-14-2022 upon evaluation today patient appears to be doing well currently in regard to her wound. She has been tolerating the dressing changes without complication. With that being said she still has quite a bit of necrotic tissue buildup on the surface of the wound unfortunately. 01-21-2022 upon evaluation today  patient actually is seeming to make excellent progress in regard to her wound. She is tolerating the dressing changes and the Iodoflex seems to be doing a great job at this point. Fortunately I do not see any signs of active infection at this time. 12/26; wound on the right lateral lower leg which was initially trauma is quite a bit better down to half a centimeter in both dimensions. It has no depth. We are using Iodoflex under 3 layer compression. On the left leg she has a skin tear we have been using Xeroform this also looks quite good. The patient probably has chronic venous insufficiency although she also takes prednisone 10 mg a day adding to the overall fragility of her skin Objective Constitutional Patient is hypertensive.. Pulse regular and within target range for patient.Marland Kitchen. Respirations regular, non-labored and within target range.. Temperature is normal and within the target range for the patient.Marland Kitchen. appears in no distress. Vitals Time Taken: 1:41 PM, Height: 63 in, Weight: 140 lbs, BMI: 24.8, Temperature: 98.3 F, Pulse: 78 bpm, Respiratory Rate: 18 breaths/min, Blood Pressure: 159/79 mmHg. General Notes: Wound exam; patient's wound under illumination looks quite healthy there is some debris on the surface but with the improvement in wound dimensions no debridement was done. Circumference has healthy looking epithelialization. Her edema control is adequate. Integumentary (Hair, Skin) Wound #1 status is Open. Original cause of wound was Gradually Appeared. The date acquired was: 10/05/2021. The wound has been in treatment 3 weeks. The wound is located on the Right,Lateral Lower Leg. The wound measures 3cm length x 2.5cm width x 0.2cm depth; 5.89cm^2 area and 1.178cm^3 volume. There is Fat Layer (Subcutaneous Tissue) exposed. There is no tunneling or undermining noted. There is a medium amount of serosanguineous drainage noted. There is medium (34-66%) red granulation within the wound bed.  There is a medium (34-66%) amount of necrotic tissue within the wound bed including Adherent Slough. Wound #3 status is Open. Original cause of wound was Skin T ear/Laceration. The date acquired was: 01/21/2022. The wound has been in treatment 1 weeks. The wound is located on the Left,Lateral Lower Leg. The wound measures 4cm length x 0.7cm width x 0.1cm depth; 2.199cm^2 area  and 0.22cm^3 volume. There is Fat Layer (Subcutaneous Tissue) exposed. There is no tunneling or undermining noted. There is a medium amount of serosanguineous drainage noted. There is medium (34-66%) pink granulation within the wound bed. There is a medium (34-66%) amount of necrotic tissue within the wound bed including Adherent Slough. Assessment [name], [name] ([id number]) [phone].pdf Page 5 of 6 Active Problems ICD-10 Chronic venous hypertension (idiopathic) with ulcer and inflammation of right lower extremity Idiopathic progressive neuropathy Non-pressure chronic ulcer of other part of right lower leg with fat layer exposed Chronic kidney disease, stage 3 unspecified Essential (primary) hypertension Procedures Wound #1 Pre-procedure diagnosis of Wound #1 is a Venous Leg Ulcer located on the Right,Lateral Lower Leg . There was a Three Layer Compression Therapy Procedure by Yevonne Pax, RN. Post procedure Diagnosis Wound #1: Same as Pre-Procedure Plan Follow-up Appointments: Return Appointment in 1 week. Bathing/ Shower/ Hygiene: May shower with wound dressing protected with water repellent cover or cast protector. No tub bath. Anesthetic (Use 'Patient Medications' Section for Anesthetic Order Entry): Lidocaine applied to wound bed Edema Control - Lymphedema / Segmental Compressive Device / Other: Optional: One layer of unna paste to top of compression wrap (to act as an anchor). Elevate, Exercise Daily and Avoid Standing for Long Periods of Time. Elevate legs to the level of  the heart and pump ankles as often as possible Elevate leg(s) parallel to the floor when sitting. WOUND #1: - Lower Leg Wound Laterality: Right, Lateral Cleanser: Wound Cleanser (Generic) 1 x Per Week/30 Days Discharge Instructions: Wash your hands with soap and water. Remove old dressing, discard into plastic bag and place into trash. Cleanse the wound with Wound Cleanser prior to applying a clean dressing using gauze sponges, not tissues or cotton balls. Do not scrub or use excessive force. Pat dry using gauze sponges, not tissue or cotton balls. Prim Dressing: IODOFLEX 0.9% Cadexomer Iodine Pad 1 x Per Week/30 Days ary Discharge Instructions: Apply Iodoflex to wound bed only as directed. Secondary Dressing: ABD Pad 5x9 (in/in) 1 x Per Week/30 Days Discharge Instructions: Cover with ABD pad Com pression Wrap: 3-LAYER WRAP - Profore Lite LF 3 Multilayer Compression Bandaging System 1 x Per Week/30 Days Discharge Instructions: Apply 3 multi-layer wrap as prescribed. WOUND #3: - Lower Leg Wound Laterality: Left, Lateral Prim Dressing: Xeroform-HBD 2x2 (in/in) 3 x Per Week/30 Days ary Discharge Instructions: Apply Xeroform-HBD 2x2 (in/in) as directed Secondary Dressing: Gauze 3 x Per Week/30 Days Discharge Instructions: As directed: dry, moistened with saline or moistened with Dakins Solution Secured With: [employer] Dressing, Latex-free, Size 5, Small-Head / Shoulder / Thigh 3 x Per Week/30 Days 1. Continuing with the same dressing to the right lateral lower leg which is her major wound. 2. Still Xeroform on the left 3. We talked about various options for support hose to protect the skin on her lower extremities. Electronic Signature(s) Signed: 01/29/2022 4:30:46 PM By: Baltazar Najjar MD Entered By: Baltazar Najjar on 01/29/2022 13:53:35 [name] ([id number]) [id number] Page 6 of  6 -------------------------------------------------------------------------------- SuperBill Details Patient Name: Date of Service: [name], [name] 01/29/2022 Medical Record Number: [id number] Patient Account Number: [id number] Date of Birth/Sex: Treating RN: [birth year] (88 y.o. [name] Primary Care Provider: [name] Other Clinician: Referring Provider: Treating Provider/Extender: RO BSO N, [name] in Treatment: 3 Diagnosis Coding ICD-10 Codes Code Description [phone] Chronic venous hypertension (idiopathic) with ulcer and inflammation of right lower extremity G60.3 Idiopathic progressive neuropathy L97.812 Non-pressure  chronic ulcer of other part of right lower leg with fat layer exposed N18.30 Chronic kidney disease, stage 3 unspecified I10 Essential (primary) hypertension Facility Procedures : CPT4 Code: [id number] Description: (Facility Use Only) [phone] - APPLY MULTLAY COMPRS LWR RT LEG Modifier: Quantity: 1 Electronic Signature(s) Signed: 01/29/2022 4:30:46 PM By: Baltazar Najjar MD Signed: 01/29/2022 4:50:51 PM By: Yevonne Pax RN Entered By: Yevonne Pax on 01/29/2022 13:47:45

## 2022-01-29 NOTE — Progress Notes (Signed)
DORENDA, PFANNENSTIEL ([id number]) [phone].pdf Page 1 of 10 Visit Report for 01/29/2022 Arrival Information Details Patient Name: Date of Service: [name], [name] 01/29/2022 1:15 PM Medical Record Number: [id number] Patient Account Number: [id number] Date of Birth/Sex: Treating RN: [birth year] (88 y.o. [name] Primary Care [name]: [name] Other Clinician: Referring [name]: Treating [name]/Extender: [name], MICHA Fox [name] Visit Information History Since Last Visit Added or deleted any medications: No Patient Arrived: [name] Any new allergies or adverse reactions: No Arrival Time: 13:27 Had a fall or experienced change in No Accompanied By: son activities of daily living that may affect Transfer Assistance: None risk of falls: Patient Identification Verified: Yes Signs or symptoms of abuse/neglect since last visito No Secondary Verification Process Completed: Yes Hospitalized since last visit: No Patient Requires Transmission-Based Precautions: No Implantable device outside of the clinic excluding No Patient Has Alerts: No cellular tissue based products placed in the center since last visit: Has Dressing in Place as Prescribed: Yes Has Compression in Place as Prescribed: Yes Pain Present Now: No Electronic Signature(s) Signed: 01/29/2022 4:50:51 PM By: Yevonne Pax RN Entered By: Yevonne Pax on 01/29/2022 13:41:30 -------------------------------------------------------------------------------- Clinic Level of Care Assessment Details Patient Name: Date of Service: [name], [name] 01/29/2022 1:15 PM Medical Record Number: [id number] Patient Account Number: [id number] Date of Birth/Sex: Treating RN: [name] (88 y.o. [name] Primary Care Marquasha Brutus: [name] Other Clinician: Referring Mare Ludtke: Treating Omni Dunsworth/Extender: [name] [name] in  Treatment: [facility] Clinic Level of Care Assessment Items TOOL 1 Quantity Score  - 0 Use when EandM and Procedure is performed on INITIAL visit ASSESSMENTS - Nursing Assessment / Reassessment  - 0 General Physical Exam (combine w/ comprehensive assessment (listed just below) when performed on new pt. evals)  - 0 Comprehensive Assessment (HX, ROS, Risk Assessments, Wounds Hx, etc.) [name] ([id number]) [id number] Page 2 of 10 ASSESSMENTS - Wound and Skin Assessment / Reassessment  - 0 Dermatologic / Skin Assessment (not related to wound area) ASSESSMENTS - Ostomy and/or Continence Assessment and Care  - 0 Incontinence Assessment and Management  - 0 Ostomy Care Assessment and Management (repouching, etc.) PROCESS - Coordination of Care  - 0 Simple Patient / Family Education for ongoing care  - 0 Complex (extensive) Patient / Family Education for ongoing care  - 0 Staff obtains Chiropractor, Records, T Results / Process Orders est  - 0 Staff telephones HHA, Nursing Homes / Clarify orders / etc  - 0 Routine Transfer to another Facility (non-emergent condition)  - 0 Routine Hospital Admission (non-emergent condition)  - 0 New Admissions / Manufacturing engineer / Ordering NPWT Apligraf, etc. ,  - 0 Emergency Hospital Admission (emergent condition) PROCESS - Special Needs  - 0 Pediatric / Minor Patient Management  - 0 Isolation Patient Management  - 0 Hearing / Language / Visual special needs  - 0 Assessment of Community assistance (transportation, D/C planning, etc.)  - 0 Additional assistance / Altered mentation  - 0 Support Surface(s) Assessment (bed, cushion, seat, etc.) INTERVENTIONS - Miscellaneous  - 0 External ear exam  - 0 Patient Transfer (multiple staff / Nurse, adult / Similar devices)  - 0 Simple Staple / Suture removal (25 or less)  - 0 Complex Staple / Suture removal (26 or  more)  - 0 Hypo/Hyperglycemic Management (do not check if billed separately)  - 0 Ankle / Brachial Index (ABI) - do not check if billed  separately Has the patient been seen at the hospital within the last three years: Yes Total Score: 0 Level Of Care: ____ Electronic Signature(s) Signed: 01/29/2022 4:50:51 PM By: Yevonne PaxEpps, [name] RN Entered By: Yevonne PaxEpps, [name] on 01/29/2022 13:47:37 -------------------------------------------------------------------------------- Compression Therapy Details Patient Name: Date of Service: [name], [name]. 01/29/2022 1:15 PM Medical Record Number: [id number] Patient Account Number: [id number] Date of Birth/Sex: Treating RN: [birth year]Fox (88 y.o. Colleen FinnerF) Epps, [name] Primary Care Caidan Hubbert: Colleen FerrierPickard, Colleen Other Clinician: Referring [name]: Treating [name]/Extender: [address] N, MICHA Fox Colleen AugustaG Fox, Colleen New HampshireGERRINGER, [name] ([id number]) [id number] Page Fox of 10 Weeks in Treatment: Fox Compression Therapy Performed for Wound Assessment: Wound #1 Right,Lateral Lower Leg Performed By: Clinician Yevonne PaxEpps, [name], RN Compression Type: Three Layer Post Procedure Diagnosis Same as Pre-procedure Electronic Signature(s) Signed: 01/29/2022 4:50:51 PM By: Yevonne PaxEpps, [name] RN Entered By: Yevonne PaxEpps, [name] on 01/29/2022 13:46:41 -------------------------------------------------------------------------------- Encounter Discharge Information Details Patient Name: Date of Service: [name], [name]. 01/29/2022 1:15 PM Medical Record Number: [id number] Patient Account Number: [id number] Date of Birth/Sex: Treating RN: [birth year]Fox (88 y.o. Colleen FinnerF) Epps, [name] Primary Care Jannessa Ogden: Colleen FerrierPickard, Colleen Other Clinician: Referring Wolfgang Finigan: Treating Mailani Degroote/Extender: Colleen BSO Dorris CarnesN, MICHA Fox Colleen LikensG Fox, Colleen Weeks in Treatment: Fox Encounter Discharge Information Items Discharge Condition: Stable Ambulatory Status: Walker Discharge Destination: Home Transportation: Private  Auto Accompanied By: son Schedule Follow-up Appointment: Yes Clinical Summary of Care: Electronic Signature(s) Signed: 01/29/2022 4:50:51 PM By: Yevonne PaxEpps, [name] RN Entered By: Yevonne PaxEpps, [name] on 01/29/2022 13:51:18 -------------------------------------------------------------------------------- Lower Extremity Assessment Details Patient Name: Date of Service: [name], [name]. 01/29/2022 1:15 PM Medical Record Number: [id number] Patient Account Number: [id number] Date of Birth/Sex: Treating RN: [birth year]Fox (88 y.o. Colleen FinnerF) Epps, [name] Primary Care Kassi Esteve: Colleen FerrierPickard, Colleen Other Clinician: Referring Clarnce Homan: Treating Perley Arthurs/Extender: [name] Colleen LikensG Fox, Colleen Weeks in Treatment: Fox Edema Assessment G[Left: Lucien MonsRRINGER, Danaka Fox ([id number])]([id number])] Franne Forts[Right: [id number]: [id number] Page 4 of 10] Assessed: [Left: No] [Right: No] Edema: [Left: Ye] [Right: s] Calf Left: Right: Point of Measurement: 32 cm From Medial Instep 34 cm Ankle Left: Right: Point of Measurement: 11 cm From Medial Instep 22 cm Vascular Assessment Pulses: Dorsalis Pedis Palpable: [Right:Yes] Electronic Signature(s) Signed: 01/29/2022 4:50:51 PM By: Yevonne PaxEpps, [name] RN Entered By: Yevonne PaxEpps, [name] on 01/29/2022 13:43:50 -------------------------------------------------------------------------------- Multi Wound Chart Details Patient Name: Date of Service: [name], Maat Fox. 01/29/2022 1:15 PM Medical Record Number: [id number] Patient Account Number: [id number] Date of Birth/Sex: Treating RN: [birth year]Fox (88 y.o. Colleen FinnerF) Epps, [name] Primary Care Elyshia Kumagai: Colleen FerrierPickard, Colleen Other Clinician: Referring Doxie Augenstein: Treating Tye Vigo/Extender: [name] Colleen LikensG Fox, Colleen Weeks in Treatment: Fox Vital Signs Height(in): 63 Pulse(bpm): 78 Weight(lbs): 140 Blood Pressure(mmHg): 159/79 Body Mass Index(BMI): 24.8 Temperature(F): 98.Fox Respiratory Rate(breaths/min): 18 [1:Photos:] [N/A:N/A] Right, Lateral Lower  Leg Left, Lateral Lower Leg N/A Wound Location: Gradually Appeared Skin Tear/Laceration N/A Wounding Event: Venous Leg Ulcer Skin Tear N/A Primary Etiology: Hypertension Hypertension N/A Comorbid History: 10/05/2021 01/21/2022 N/A Date Acquired: Fox 1 N/A Weeks of Treatment: Open Open N/A Wound Status: No No N/A Wound Recurrence: 3x2.5x0.2 4x0.7x0.1 N/A Measurements Fox x W x D (cm) 5.89 2.199 N/A A (cm) : rea 1.178 0.22 N/A Volume (cm) : 62.40% 12.50% N/A % Reduction in A reaErasmo Score: Ernsberger, Marlo Fox ([id number]) [id number] Page 5 of 10 62.40% 12.40% N/A % Reduction in Volume: Full Thickness Without Exposed Full Thickness Without Exposed N/A Classification: Support Structures Support Structures Medium Medium N/A Exudate Amount: Serosanguineous Serosanguineous N/A Exudate Type: red, brown red, brown N/A Exudate Color: Medium (34-66%) Medium (34-66%) N/A Granulation  Amount: Red Pink N/A Granulation Quality: Medium (34-66%) Medium (34-66%) N/A Necrotic Amount: Fat Layer (Subcutaneous Tissue): Yes Fat Layer (Subcutaneous Tissue): Yes N/A Exposed Structures: Fascia: No Fascia: No Tendon: No Tendon: No Muscle: No Muscle: No Joint: No Joint: No Bone: No Bone: No None None N/A Epithelialization: Treatment Notes Electronic Signature(s) Signed: 01/29/2022 4:50:51 PM By: Yevonne Pax RN Entered By: Yevonne Pax on 01/29/2022 13:43:58 -------------------------------------------------------------------------------- Multi-Disciplinary Care Plan Details Patient Name: Date of Service: [name], [name] 01/29/2022 1:15 PM Medical Record Number: [id number] Patient Account Number: [id number] Date of Birth/Sex: Treating RN: [birth year] (88 y.o. [name] Primary Care Raekwan Spelman: [name] Other Clinician: Referring Macdonald Rigor: Treating Wen Merced/Extender: [name] [name] Active Inactive Abuse /  Safety / Falls / Self Care Management Nursing Diagnoses: Potential for injury related to falls Goals: Patient will remain injury free related to falls Date Initiated: 01/07/2022 Target Resolution Date: 02/07/2022 Goal Status: Active Interventions: Assess Activities of Daily Living upon admission and as needed Assess fall risk on admission and as needed Assess: immobility, friction, shearing, incontinence upon admission and as needed Assess impairment of mobility on admission and as needed per policy Assess personal safety and home safety (as indicated) on admission and as needed Assess self care needs on admission and as needed Notes: Wound/Skin Impairment Nursing Diagnoses: Knowledge deficit related to ulceration/compromised skin integrity Goals: Patient/caregiver will verbalize understanding of skin care regimen LIANNA, SITZMANN ([id number]) [id number] Page 6 of 10 Date Initiated: 01/07/2022 Target Resolution Date: 02/07/2022 Goal Status: Active Ulcer/skin breakdown will have a volume reduction of 30% by week 4 Date Initiated: 01/07/2022 Target Resolution Date: 02/07/2022 Goal Status: Active Ulcer/skin breakdown will have a volume reduction of 50% by week 8 Date Initiated: 01/07/2022 Target Resolution Date: 03/10/2022 Goal Status: Active Ulcer/skin breakdown will have a volume reduction of 80% by week 12 Date Initiated: 01/07/2022 Target Resolution Date: Fox/05/2022 Goal Status: Active Ulcer/skin breakdown will heal within 14 weeks Date Initiated: 01/07/2022 Target Resolution Date: 05/10/2022 Goal Status: Active Interventions: Assess patient/caregiver ability to obtain necessary supplies Assess patient/caregiver ability to perform ulcer/skin care regimen upon admission and as needed Assess ulceration(s) every visit Notes: Electronic Signature(s) Signed: 01/29/2022 4:50:51 PM By: Yevonne Pax RN Entered By: Yevonne Pax on 01/29/2022  13:48:43 -------------------------------------------------------------------------------- Pain Assessment Details Patient Name: Date of Service: [name], [name] 01/29/2022 1:15 PM Medical Record Number: [id number] Patient Account Number: [id number] Date of Birth/Sex: Treating RN: [birth year] (88 y.o. [name] Primary Care Manpreet Kemmer: [name] Other Clinician: Referring Shallon Yaklin: Treating Joesphine Schemm/Extender: [name] [name] Active Problems Location of Pain Severity and Description of Pain Patient Has Paino No Site Locations Pain Management and Medication Current Pain Management: MAGIN, BALBI ([id number]) [id number] Page 7 of 10 Electronic Signature(s) Signed: 01/29/2022 4:50:51 PM By: Yevonne Pax RN Entered By: Yevonne Pax on 01/29/2022 13:42:04 -------------------------------------------------------------------------------- Patient/Caregiver Education Details Patient Name: Date of Service: Colleen Score 12/26/2023andnbsp1:15 PM Medical Record Number: [id number] Patient Account Number: [id number] Date of Birth/Gender: Treating RN: [birth year] Fox (88 y.o. [name] Primary Care Physician: [name] Other Clinician: Referring Physician: Treating Physician/Extender: Colleen BSO Dorris Carnes, MICHA Fox [name] Education Assessment Education Provided To: Patient Education Topics Provided Wound/Skin Impairment: Methods: Explain/Verbal Responses: State content correctly Electronic Signature(s) Signed: 01/29/2022 4:50:51 PM By: Yevonne Pax RN Entered By: Yevonne Pax on 01/29/2022 13:48:37 -------------------------------------------------------------------------------- Wound Assessment Details Patient Name: Date of Service:  [name], [name] 01/29/2022 1:15 PM Medical Record Number: [id number] Patient Account Number: [id number] Date of Birth/Sex: Treating  RN: [birth year]Fox (88 y.o. [name] Primary Care Mazella Deen: [name] Other Clinician: Referring Jerlisa Diliberto: Treating Emmet Messer/Extender: [name] Colleen Augusta Weeks in Treatment: Fox Wound Status Wound Number: 1 Primary Etiology: Venous Leg Ulcer Wound Location: Right, Lateral Lower Leg Wound Status: Open Wounding Event: Gradually Appeared Comorbid History: Hypertension Date Acquired: 10/05/2021 Weeks Of Treatment: Fox Clustered Wound: No LOUINE, TENPENNY ([id number]) [id number] Page 8 of 10 Photos Wound Measurements Length: (cm) Fox Width: (cm) 2.5 Depth: (cm) 0.2 Area: (cm) 5.89 Volume: (cm) 1.178 % Reduction in Area: 62.4% % Reduction in Volume: 62.4% Epithelialization: None Tunneling: No Undermining: No Wound Description Classification: Full Thickness Without Exposed Support Structures Exudate Amount: Medium Exudate Type: Serosanguineous Exudate Color: red, brown Foul Odor After Cleansing: No Slough/Fibrino Yes Wound Bed Granulation Amount: Medium (34-66%) Exposed Structure Granulation Quality: Red Fascia Exposed: No Necrotic Amount: Medium (34-66%) Fat Layer (Subcutaneous Tissue) Exposed: Yes Necrotic Quality: Adherent Slough Tendon Exposed: No Muscle Exposed: No Joint Exposed: No Bone Exposed: No Treatment Notes Wound #1 (Lower Leg) Wound Laterality: Right, Lateral Cleanser Wound Cleanser Discharge Instruction: Wash your hands with soap and water. Remove old dressing, discard into plastic bag and place into trash. Cleanse the wound with Wound Cleanser prior to applying a clean dressing using gauze sponges, not tissues or cotton balls. Do not scrub or use excessive force. Pat dry using gauze sponges, not tissue or cotton balls. Peri-Wound Care Topical Primary Dressing IODOFLEX 0.9% Cadexomer Iodine Pad Discharge Instruction: Apply Iodoflex to wound bed only as directed. Secondary Dressing ABD Pad 5x9  (in/in) Discharge Instruction: Cover with ABD pad Secured With Compression Wrap Fox-LAYER WRAP - Profore Lite LF Fox Multilayer Compression Bandaging System Discharge Instruction: Apply Fox multi-layer wrap as prescribed. Compression Stockings Add-Ons Electronic Signature(s) Signed: 01/29/2022 4:50:51 PM By: Yevonne Pax RN Entered By: Yevonne Pax on 01/29/2022 13:43:04 Colleen Score ([id number]) [id number] Page 9 of 10 -------------------------------------------------------------------------------- Wound Assessment Details Patient Name: Date of Service: [name], [name] 01/29/2022 1:15 PM Medical Record Number: [id number] Patient Account Number: [id number] Date of Birth/Sex: Treating RN: [birth year]-Mar-Fox (88 y.o. [name] Primary Care Siri Buege: [name] Other Clinician: Referring Laymond Postle: Treating Kupono Marling/Extender: [name] [name] Wound Status Wound Number: Fox Primary Etiology: Skin Tear Wound Location: Left, Lateral Lower Leg Wound Status: Open Wounding Event: Skin Tear/Laceration Comorbid History: Hypertension Date Acquired: 01/21/2022 Weeks Of Treatment: 1 Clustered Wound: No Photos Wound Measurements Length: (cm) 4 Width: (cm) 0.7 Depth: (cm) 0.1 Area: (cm) 2.199 Volume: (cm) 0.22 % Reduction in Area: 12.5% % Reduction in Volume: 12.4% Epithelialization: None Tunneling: No Undermining: No Wound Description Classification: Full Thickness Without Exposed Suppor Exudate Amount: Medium Exudate Type: Serosanguineous Exudate Color: red, brown t Structures Foul Odor After Cleansing: No Slough/Fibrino Yes Wound Bed Granulation Amount: Medium (34-66%) Exposed Structure Granulation Quality: Pink Fascia Exposed: No Necrotic Amount: Medium (34-66%) Fat Layer (Subcutaneous Tissue) Exposed: Yes Necrotic Quality: Adherent Slough Tendon Exposed: No Muscle Exposed: No Joint Exposed:  No Bone Exposed: No Treatment Notes Wound #Fox (Lower Leg) Wound Laterality: Left, Lateral Cleanser Peri-Wound Care Topical MERTIS, MOSHER ([id number]) [id number] Page 10 of 10 Primary Dressing Xeroform-HBD 2x2 (in/in) Discharge Instruction: Apply Xeroform-HBD 2x2 (in/in) as directed Secondary Dressing Gauze Discharge Instruction: As directed: dry, moistened with saline or moistened with Dakins Solution Secured With [employer],  Latex-free, Size 5, Small-Head / Shoulder / Thigh Compression Wrap Compression Stockings Add-Ons Electronic Signature(s) Signed: 01/29/2022 4:50:51 PM By: Yevonne Pax RN Entered By: Yevonne Pax on 01/29/2022 13:43:27 -------------------------------------------------------------------------------- Vitals Details Patient Name: Date of Service: Colleen Score 01/29/2022 1:15 PM Medical Record Number: [id number] Patient Account Number: [id number] Date of Birth/Sex: Treating RN: 20[birth year]Jun-Fox (88 y.o. [name] Primary Care Arik Husmann: [name] Other Clinician: Referring Salomon Ganser: Treating Kiaan Overholser/Extender: [name] [name] Vital Signs Time Taken: 13:41 Temperature (F): 98.Fox Height (in): 63 Pulse (bpm): 78 Weight (lbs): 140 Respiratory Rate (breaths/min): 18 Body Mass Index (BMI): 24.8 Blood Pressure (mmHg): 159/79 Reference Range: 80 - 120 mg / dl Electronic Signature(s) Signed: 01/29/2022 4:50:51 PM By: Yevonne Pax RN Entered By: Yevonne Pax on 01/29/2022 13:41:57

## 2022-01-30 ENCOUNTER — Other Ambulatory Visit: Payer: Self-pay

## 2022-01-30 ENCOUNTER — Other Ambulatory Visit: Payer: Self-pay | Admitting: Family Medicine

## 2022-01-30 DIAGNOSIS — F5101 Primary insomnia: Secondary | ICD-10-CM

## 2022-01-30 NOTE — Telephone Encounter (Signed)
Pt believes that #DS is incorrect? Pls advie

## 2022-01-31 MED ORDER — ALPRAZOLAM 0.5 MG PO TABS: 0.5000 mg | ORAL_TABLET | Freq: Three times a day (TID) | ORAL | 0 refills | Status: DC | PRN

## 2022-02-08 ENCOUNTER — Other Ambulatory Visit: Payer: Self-pay | Admitting: Family Medicine

## 2022-02-08 NOTE — Telephone Encounter (Signed)
Requested medications are due for refill today.  unsure  Requested medications are on the active medications list.  yes  Last refill. 01/14/2022 #30 0 rf  Future visit scheduled.   no  Notes to clinic.  Refill not delegated.    Requested Prescriptions  Pending Prescriptions Disp Refills   predniSONE (DELTASONE) 10 MG tablet [Pharmacy Med Name: predniSONE 10 MG Oral Tablet] 30 tablet 0    Sig: Take 1 tablet by mouth once daily with breakfast     Not Delegated - Endocrinology:  Oral Corticosteroids Failed - 02/08/2022  1:30 PM      Failed - This refill cannot be delegated      Failed - Manual Review: Eye exam for IOP if prolonged treatment      Failed - Glucose (serum) in normal range and within 180 days    Glucose, Bld  Date Value Ref Range Status  12/03/2021 115 (H) 65 - 99 mg/dL Final    Comment:    .            Fasting reference interval . For someone without known diabetes, a glucose value between 100 and 125 mg/dL is consistent with prediabetes and should be confirmed with a follow-up test. .          Failed - Valid encounter within last 6 months    Recent Outpatient Visits           9 months ago PMR (polymyalgia rheumatica) ([place])   [place] Pickard, Cammie Mcgee, MD   1 year ago PMR (polymyalgia rheumatica) ([place])   [place] Susy Frizzle, MD   1 year ago PMR (polymyalgia rheumatica) ([employer])   [place] Susy Frizzle, MD   2 years ago Stage 3b chronic kidney disease ([place])   [place] Susy Frizzle, MD   2 years ago Bilateral leg weakness   [place] Pickard, Cammie Mcgee, MD              Failed - Bone Mineral Density or Dexa Scan completed in the last 2 years      58 - K in normal range and within 180 days    Potassium  Date Value Ref Range Status  12/03/2021 4.3 3.5 - 5.3 mmol/L Final         Passed - Na in normal range and within 180 days     Sodium  Date Value Ref Range Status  12/03/2021 139 135 - 146 mmol/L Final         Passed - Last BP in normal range    BP Readings from Last 1 Encounters:  12/13/21 130/60

## 2022-02-11 ENCOUNTER — Other Ambulatory Visit: Payer: Self-pay | Admitting: Family Medicine

## 2022-02-11 DIAGNOSIS — F5101 Primary insomnia: Secondary | ICD-10-CM

## 2022-02-12 ENCOUNTER — Encounter: Payer: 59 | Attending: Physician Assistant | Admitting: Physician Assistant

## 2022-02-12 DIAGNOSIS — I87331 Chronic venous hypertension (idiopathic) with ulcer and inflammation of right lower extremity: Secondary | ICD-10-CM | POA: Diagnosis not present

## 2022-02-12 DIAGNOSIS — S81812A Laceration without foreign body, left lower leg, initial encounter: Secondary | ICD-10-CM | POA: Diagnosis not present

## 2022-02-12 DIAGNOSIS — L97822 Non-pressure chronic ulcer of other part of left lower leg with fat layer exposed: Secondary | ICD-10-CM | POA: Insufficient documentation

## 2022-02-12 DIAGNOSIS — I129 Hypertensive chronic kidney disease with stage 1 through stage 4 chronic kidney disease, or unspecified chronic kidney disease: Secondary | ICD-10-CM | POA: Diagnosis not present

## 2022-02-12 DIAGNOSIS — G603 Idiopathic progressive neuropathy: Secondary | ICD-10-CM | POA: Insufficient documentation

## 2022-02-12 DIAGNOSIS — L97812 Non-pressure chronic ulcer of other part of right lower leg with fat layer exposed: Secondary | ICD-10-CM | POA: Diagnosis not present

## 2022-02-12 DIAGNOSIS — N183 Chronic kidney disease, stage 3 unspecified: Secondary | ICD-10-CM | POA: Diagnosis not present

## 2022-02-12 DIAGNOSIS — I872 Venous insufficiency (chronic) (peripheral): Secondary | ICD-10-CM | POA: Diagnosis not present

## 2022-02-12 NOTE — Progress Notes (Signed)
[name] ([id number]) [id number] Page 1 of 9 Visit Report for 02/12/2022 Chief Complaint Document Details Patient Name: Date of Service: [name], [name] 02/12/2022 2:00 PM Medical Record Number: [id number] Patient Account Number: [id number] Date of Birth/Sex: Treating RN: [birth year] (89 y.o. [name] Primary Care Provider: Lynnea Fox Other Clinician: Betha Fox Referring Provider: Treating Provider/Extender: [name] in Treatment: 5 Information Obtained from: Patient Chief Complaint Right LE Ulcer Electronic Signature(s) Signed: 02/12/2022 2:05:23 PM By: Lenda Kelp PA-C Entered By: Lenda Kelp on 02/12/2022 14:05:23 -------------------------------------------------------------------------------- Debridement Details Patient Name: Date of Service: [name] 02/12/2022 2:00 PM Medical Record Number: [id number] Patient Account Number: [id number] Date of Birth/Sex: Treating RN: [birth year] (89 y.o. [name] Primary Care Provider: Lynnea Fox Other Clinician: Betha Fox Referring Provider: Treating Provider/Extender: [name] in Treatment: 5 Debridement Performed for Assessment: Wound #1 Right,Lateral Lower Leg Performed By: Physician Colleen Meuse., PA-C Debridement Type: Debridement Severity of Tissue Pre Debridement: Fat layer exposed Level of Consciousness (Pre-procedure): Awake and Alert Pre-procedure Verification/Time Out Yes - 14:38 Taken: Start Time: 14:38 T Area Debrided (Fox x W): otal 1.7 (cm) x 2 (cm) = 3.4 (cm) Tissue and other material debrided: Viable, Non-Viable, Slough, Subcutaneous, Slough Level: Skin/Subcutaneous Tissue Debridement Description: Excisional Instrument: Curette Bleeding: Minimum Hemostasis Achieved: Pressure Response to Treatment: Procedure was tolerated well Level of Consciousness (Post- Awake and  Alert procedure): [name], [name] ([id number]) [id number] Page 2 of 9 Post Debridement Measurements of Total Wound Length: (cm) 1.7 Width: (cm) 2 Depth: (cm) 0.1 Volume: (cm) 0.267 Character of Wound/Ulcer Post Debridement: Stable Severity of Tissue Post Debridement: Fat layer exposed Post Procedure Diagnosis Same as Pre-procedure Electronic Signature(s) Signed: 02/12/2022 2:56:19 PM By: Lenda Kelp PA-C Signed: 02/13/2022 5:10:32 PM By: [name] Signed: 02/14/2022 3:13:07 PM By: Colleen Pax RN Entered By: [name] on 02/12/2022 14:39:03 -------------------------------------------------------------------------------- Debridement Details Patient Name: Date of Service: [name] 02/12/2022 2:00 PM Medical Record Number: [id number] Patient Account Number: [id number] Date of Birth/Sex: Treating RN: [birth year] (89 y.o. [name] Primary Care Provider: Lynnea Fox Other Clinician: Betha Fox Referring Provider: Treating Provider/Extender: [name] in Treatment: 5 Debridement Performed for Assessment: Wound #3 Left,Lateral Lower Leg Performed By: Physician Colleen Meuse., PA-C Debridement Type: Debridement Level of Consciousness (Pre-procedure): Awake and Alert Pre-procedure Verification/Time Out Yes - 14:39 Taken: Start Time: 14:39 T Area Debrided (Fox x W): otal 3.7 (cm) x 0.7 (cm) = 2.59 (cm) Tissue and other material debrided: Viable, Non-Viable, Slough, Subcutaneous, Slough Level: Skin/Subcutaneous Tissue Debridement Description: Excisional Instrument: Curette Bleeding: Minimum Hemostasis Achieved: Pressure Response to Treatment: Procedure was tolerated well Level of Consciousness (Post- Awake and Alert procedure): Post Debridement Measurements of Total Wound Length: (cm) 3.7 Width: (cm) 0.7 Depth: (cm) 0.3 Volume: (cm) 0.61 Character of Wound/Ulcer Post Debridement:  Stable Post Procedure Diagnosis Same as Pre-procedure Electronic Signature(s) Signed: 02/12/2022 2:56:19 PM By: Lenda Kelp PA-C Signed: 02/13/2022 5:10:32 PM By: [name] Signed: 02/14/2022 3:13:07 PM By: Colleen Pax RN Entered By: [name] on 02/12/2022 14:42:22 [name] ([id number]) [id number] Page 3 of 9 -------------------------------------------------------------------------------- HPI Details Patient Name: Date of Service: [name] 02/12/2022 2:00 PM Medical Record Number: [id number] Patient Account Number: [id number] Date of Birth/Sex: Treating RN: [name] (89 y.o. [name] Primary Care Provider: Lynnea Fox Other Clinician: Betha Fox Referring Provider: Treating Provider/Extender: Wilma Flavin Weeks in Treatment: 5 History of  Present Illness HPI Description: 01-07-2022 upon evaluation today patient appears to be doing poorly in regard to the wound on the right lateral lower extremity. She tells me that this occurred after a fall that she sustained on and around Labor Day weekend. Subsequently following this fall she tells me that she had a wound that really has not improved significantly. She has been seeing her primary care provider and they have been attempting to do what they could to get this improving overall. With that being said the patient has been on Silvadene cream, Neosporin, and more recently soaking with peroxide then wet to dry gauze recommended by primary care provider. Lastly Santyl was advised but they have not been able to get that filled as of yet. Patient does have a history of neuropathy, chronic kidney disease stage III, and hypertension along with chronic venous insufficiency otherwise she is fairly healthy and has nothing else that should affect her ability to heal. 01-14-2022 upon evaluation today patient appears to be doing well currently in regard to her wound.  She has been tolerating the dressing changes without complication. With that being said she still has quite a bit of necrotic tissue buildup on the surface of the wound unfortunately. 01-21-2022 upon evaluation today patient actually is seeming to make excellent progress in regard to her wound. She is tolerating the dressing changes and the Iodoflex seems to be doing a great job at this point. Fortunately I do not see any signs of active infection at this time. 12/26; wound on the right lateral lower leg which was initially trauma is quite a bit better down to half a centimeter in both dimensions. It has no depth. We are using Iodoflex under 3 layer compression. On the left leg she has a skin tear we have been using Xeroform this also looks quite good. The patient probably has chronic venous insufficiency although she also takes prednisone 10 mg a day adding to the overall fragility of her skin 02-12-2022 upon evaluation today patient appears to be doing well currently in regard to her wound. She has been tolerating the dressing changes without complication. The right leg is looking better the left leg is not feeling quite as much as we would like to see. I think that she has some necrotic tissue that needs to be cleaned away to be honest. Fortunately I do not see any signs of active infection locally nor systemically at this time which is great news. Electronic Signature(s) Signed: 02/12/2022 2:54:17 PM By: [name] PA-C Entered By: [name] on 02/12/2022 14:54:17 -------------------------------------------------------------------------------- Physical Exam Details Patient Name: Date of Service: [name], [name] 02/12/2022 2:00 PM Medical Record Number: [id number] Patient Account Number: [id number] Date of Birth/Sex: Treating RN: [birth year] (89 y.o. [name] Primary Care Provider: Jenna Luo Other Clinician: Massie Kluver Referring Provider: Treating Provider/Extender:  Veronia Beets Weeks in Treatment: 5 Constitutional Well-nourished and well-hydrated in no acute distress. [name], [name] ([id number]) [id number] Page 4 of 9 Respiratory normal breathing without difficulty. Psychiatric this patient is able to make decisions and demonstrates good insight into disease process. Alert and Oriented x 3. pleasant and cooperative. Notes Upon inspection patient's wound bed actually showed signs of good granulation on the right leg I did perform debridement clearway some of the slough and biofilm on the surface of the wound which she tolerated today without complication and postdebridement this is significantly improved. On the left leg I do believe that this required a fairly  aggressive debridement and this was a bit deeper than what it originally appeared. I am going to go ahead and see about compression wrapping her here as well with a switch to collagen on both legs. Electronic Signature(s) Signed: 02/12/2022 2:54:51 PM By: Lenda KelpStone III, Ashly Yepez PA-C Entered By: Lenda KelpStone III, Ltanya Bayley on 02/12/2022 14:54:50 -------------------------------------------------------------------------------- Physician Orders Details Patient Name: Date of Service: Colleen ScoreGERRINGER, [name]. 02/12/2022 2:00 PM Medical Record Number: [id number] Patient Account Number: [id number] Date of Birth/Sex: Treating RN: [birth year] (89 y.o. Colleen PeachF) Epps, [name] Primary Care Provider: Lynnea FerrierPickard, Warren Other Clinician: Betha LoaVenable, Angie Referring Provider: Treating Provider/Extender: Colleen OreStone, Chalese Fox Pickard, Warren Weeks in Treatment: 5 Verbal / Phone Orders: No Diagnosis Coding ICD-10 Coding Code Description I87.331 Chronic venous hypertension (idiopathic) with ulcer and inflammation of right lower extremity G60.3 Idiopathic progressive neuropathy L97.812 Non-pressure chronic ulcer of other part of right lower leg with fat layer exposed N18.30 Chronic kidney disease, stage 3  unspecified I10 Essential (primary) hypertension Follow-up Appointments Return Appointment in 1 week. Bathing/ Shower/ Hygiene May shower with wound dressing protected with water repellent cover or cast protector. No tub bath. Anesthetic (Use 'Patient Medications' Section for Anesthetic Order Entry) Lidocaine applied to wound bed Edema Control - Lymphedema / Segmental Compressive Device / Other Optional: One layer of unna paste to top of compression wrap (to act as an anchor). Elevate, Exercise Daily and A void Standing for Long Periods of Time. Elevate legs to the level of the heart and pump ankles as often as possible Elevate leg(s) parallel to the floor when sitting. Wound Treatment Wound #1 - Lower Leg Wound Laterality: Right, Lateral Cleanser: Wound Cleanser (Generic) 1 x Per Week/30 Days Discharge Instructions: Wash your hands with soap and water. Remove old dressing, discard into plastic bag and place into trash. Cleanse the wound with Wound Cleanser prior to applying a clean dressing using gauze sponges, not tissues or cotton balls. Do not scrub or use excessive force. Pat dry using gauze sponges, not tissue or cotton balls. Prim Dressing: Prisma 4.34 (in) 1 x Per Week/30 Days ary Discharge Instructions: Moisten w/normal saline or sterile water; Cover wound as directed. Do not remove from wound bed. Colleen ScoreGERRINGER, [name] ([id number]) [id number] Page 5 of 9 Secondary Dressing: ABD Pad 5x9 (in/in) 1 x Per Week/30 Days Discharge Instructions: Cover with ABD pad Compression Wrap: 3-LAYER WRAP - Profore Lite LF 3 Multilayer Compression Bandaging System 1 x Per Week/30 Days Discharge Instructions: Apply 3 multi-layer wrap as prescribed. Wound #3 - Lower Leg Wound Laterality: Left, Lateral Prim Dressing: Prisma 4.34 (in) 3 x Per Week/30 Days ary Discharge Instructions: Moisten w/normal saline or sterile water; Cover wound as directed. Do not remove from wound  bed. Secondary Dressing: Zetuvit Plus 4x8 (in/in) 3 x Per Week/30 Days Compression Wrap: 3-LAYER WRAP - Profore Lite LF 3 Multilayer Compression Bandaging System 3 x Per Week/30 Days Discharge Instructions: Apply 3 multi-layer wrap as prescribed. Electronic Signature(s) Signed: 02/12/2022 2:56:19 PM By: Lenda KelpStone III, Cathe Bilger PA-C Signed: 02/13/2022 5:10:32 PM By: Colleen LoaVenable, Angie Entered By: Colleen LoaVenable, Angie on 02/12/2022 14:47:33 -------------------------------------------------------------------------------- Problem List Details Patient Name: Date of Service: Colleen ScoreGERRINGER, [name]. 02/12/2022 2:00 PM Medical Record Number: [id number] Patient Account Number: [id number] Date of Birth/Sex: Treating RN: [birth year] (89 y.o. Colleen FinnerF) Epps, [name] Primary Care Provider: Lynnea FerrierPickard, Warren Other Clinician: Betha LoaVenable, Angie Referring Provider: Treating Provider/Extender: Colleen OreStone, Brylei Pedley Pickard, Warren Weeks in Treatment: 5 Active Problems ICD-10 Encounter Code Description Active Date MDM Diagnosis I87.331 Chronic venous hypertension (idiopathic) with ulcer and inflammation of  right 01/07/2022 No Yes lower extremity G60.3 Idiopathic progressive neuropathy 01/07/2022 No Yes L97.812 Non-pressure chronic ulcer of other part of right lower leg with fat layer 01/07/2022 No Yes exposed N18.30 Chronic kidney disease, stage 3 unspecified 01/07/2022 No Yes I10 Essential (primary) hypertension 01/07/2022 No Yes Inactive Problems Colleen ScoreGERRINGER, [name] ([id number]) [id number] Page 6 of 9 Resolved Problems Electronic Signature(s) Signed: 02/12/2022 2:05:20 PM By: Lenda KelpStone III, Rosalind Guido PA-C Entered By: Lenda KelpStone III, Elmyra Banwart on 02/12/2022 14:05:19 -------------------------------------------------------------------------------- Progress Note Details Patient Name: Date of Service: Colleen ScoreGERRINGER, [name]. 02/12/2022 2:00 PM Medical Record Number: [id number] Patient Account Number: [id number] Date of Birth/Sex: Treating  RN: [birth year] (89 y.o. Colleen FinnerF) Epps, [name] Primary Care Provider: Lynnea FerrierPickard, Warren Other Clinician: Betha LoaVenable, Angie Referring Provider: Treating Provider/Extender: Colleen OreStone, Kashmere Staffa Pickard, Warren Weeks in Treatment: 5 Subjective Chief Complaint Information obtained from Patient Right LE Ulcer History of Present Illness (HPI) 01-07-2022 upon evaluation today patient appears to be doing poorly in regard to the wound on the right lateral lower extremity. She tells me that this occurred after a fall that she sustained on and around Labor Day weekend. Subsequently following this fall she tells me that she had a wound that really has not improved significantly. She has been seeing her primary care provider and they have been attempting to do what they could to get this improving overall. With that being said the patient has been on Silvadene cream, Neosporin, and more recently soaking with peroxide then wet to dry gauze recommended by primary care provider. Lastly Santyl was advised but they have not been able to get that filled as of yet. Patient does have a history of neuropathy, chronic kidney disease stage III, and hypertension along with chronic venous insufficiency otherwise she is fairly healthy and has nothing else that should affect her ability to heal. 01-14-2022 upon evaluation today patient appears to be doing well currently in regard to her wound. She has been tolerating the dressing changes without complication. With that being said she still has quite a bit of necrotic tissue buildup on the surface of the wound unfortunately. 01-21-2022 upon evaluation today patient actually is seeming to make excellent progress in regard to her wound. She is tolerating the dressing changes and the Iodoflex seems to be doing a great job at this point. Fortunately I do not see any signs of active infection at this time. 12/26; wound on the right lateral lower leg which was initially trauma is quite a bit better  down to half a centimeter in both dimensions. It has no depth. We are using Iodoflex under 3 layer compression. On the left leg she has a skin tear we have been using Xeroform this also looks quite good. The patient probably has chronic venous insufficiency although she also takes prednisone 10 mg a day adding to the overall fragility of her skin 02-12-2022 upon evaluation today patient appears to be doing well currently in regard to her wound. She has been tolerating the dressing changes without complication. The right leg is looking better the left leg is not feeling quite as much as we would like to see. I think that she has some necrotic tissue that needs to be cleaned away to be honest. Fortunately I do not see any signs of active infection locally nor systemically at this time which is great news. Objective Constitutional Well-nourished and well-hydrated in no acute distress. Vitals Time Taken: 2:08 PM, Weight: 140 lbs, Temperature: 97.9 F, Pulse: 70 bpm, Respiratory Rate: 18 breaths/min, Blood Pressure: 155/73  mmHg. Respiratory normal breathing without difficulty. Psychiatric this patient is able to make decisions and demonstrates good insight into disease process. Alert and Oriented x 3. pleasant and cooperative. [name], [name] ([id number]) [id number] Page 7 of 9 General Notes: Upon inspection patient's wound bed actually showed signs of good granulation on the right leg I did perform debridement clearway some of the slough and biofilm on the surface of the wound which she tolerated today without complication and postdebridement this is significantly improved. On the left leg I do believe that this required a fairly aggressive debridement and this was a bit deeper than what it originally appeared. I am going to go ahead and see about compression wrapping her here as well with a switch to collagen on both legs. Integumentary (Hair, Skin) Wound #1 status is  Open. Original cause of wound was Gradually Appeared. The date acquired was: 10/05/2021. The wound has been in treatment 5 weeks. The wound is located on the Right,Lateral Lower Leg. The wound measures 1.7cm length x 2cm width x 0.1cm depth; 2.67cm^2 area and 0.267cm^3 volume. There is Fat Layer (Subcutaneous Tissue) exposed. There is a medium amount of serosanguineous drainage noted. There is medium (34-66%) red granulation within the wound bed. There is a medium (34-66%) amount of necrotic tissue within the wound bed including Adherent Slough. Wound #3 status is Open. Original cause of wound was Skin T ear/Laceration. The date acquired was: 01/21/2022. The wound has been in treatment 3 weeks. The wound is located on the Left,Lateral Lower Leg. The wound measures 3.7cm length x 0.7cm width x 0.1cm depth; 2.034cm^2 area and 0.203cm^3 volume. There is Fat Layer (Subcutaneous Tissue) exposed. There is a medium amount of serosanguineous drainage noted. There is medium (34-66%) pink granulation within the wound bed. There is a medium (34-66%) amount of necrotic tissue within the wound bed including Adherent Slough. Assessment Active Problems ICD-10 Chronic venous hypertension (idiopathic) with ulcer and inflammation of right lower extremity Idiopathic progressive neuropathy Non-pressure chronic ulcer of other part of right lower leg with fat layer exposed Chronic kidney disease, stage 3 unspecified Essential (primary) hypertension Procedures Wound #1 Pre-procedure diagnosis of Wound #1 is a Venous Leg Ulcer located on the Right,Lateral Lower Leg .Severity of Tissue Pre Debridement is: Fat layer exposed. There was a Excisional Skin/Subcutaneous Tissue Debridement with a total area of 3.4 sq cm performed by Colleen Meuse., PA-C. With the following instrument(s): Curette to remove Viable and Non-Viable tissue/material. Material removed includes Subcutaneous Tissue and Slough and. A time out was conducted  at 14:38, prior to the start of the procedure. A Minimum amount of bleeding was controlled with Pressure. The procedure was tolerated well. Post Debridement Measurements: 1.7cm length x 2cm width x 0.1cm depth; 0.267cm^3 volume. Character of Wound/Ulcer Post Debridement is stable. Severity of Tissue Post Debridement is: Fat layer exposed. Post procedure Diagnosis Wound #1: Same as Pre-Procedure Pre-procedure diagnosis of Wound #1 is a Venous Leg Ulcer located on the Right,Lateral Lower Leg . There was a Three Layer Compression Therapy Procedure with a pre-treatment ABI of 0.9 by [name]. Post procedure Diagnosis Wound #1: Same as Pre-Procedure Wound #3 Pre-procedure diagnosis of Wound #3 is a Skin T located on the Left,Lateral Lower Leg . There was a Excisional Skin/Subcutaneous Tissue Debridement with ear a total area of 2.59 sq cm performed by Colleen Meuse., PA-C. With the following instrument(s): Curette to remove Viable and Non-Viable tissue/material. Material removed includes Subcutaneous Tissue and Slough and. A time  out was conducted at 14:39, prior to the start of the procedure. A Minimum amount of bleeding was controlled with Pressure. The procedure was tolerated well. Post Debridement Measurements: 3.7cm length x 0.7cm width x 0.3cm depth; 0.61cm^3 volume. Character of Wound/Ulcer Post Debridement is stable. Post procedure Diagnosis Wound #3: Same as Pre-Procedure Pre-procedure diagnosis of Wound #3 is a Skin T located on the Left,Lateral Lower Leg . There was a Three Layer Compression Therapy Procedure by ear Glynda Jaeger, Angie. Post procedure Diagnosis Wound #3: Same as Pre-Procedure Notes: per Allen Derry, ok to wrap. Plan Follow-up Appointments: Return Appointment in 1 week. Bathing/ Shower/ Hygiene: May shower with wound dressing protected with water repellent cover or cast protector. No tub bath. Anesthetic (Use 'Patient Medications' Section for Anesthetic Order  Entry): Lidocaine applied to wound bed Edema Control - Lymphedema / Segmental Compressive Device / Other: Optional: One layer of unna paste to top of compression wrap (to act as an anchor). Elevate, Exercise Daily and Avoid Standing for Long Periods of Time. Elevate legs to the level of the heart and pump ankles as often as possible Elevate leg(s) parallel to the floor when sitting. WOUND #1: - Lower Leg Wound Laterality: Right, Lateral [name], [facility] ([id number]) [id number] Page 8 of 9 Cleanser: Wound Cleanser (Generic) 1 x Per Week/30 Days Discharge Instructions: Wash your hands with soap and water. Remove old dressing, discard into plastic bag and place into trash. Cleanse the wound with Wound Cleanser prior to applying a clean dressing using gauze sponges, not tissues or cotton balls. Do not scrub or use excessive force. Pat dry using gauze sponges, not tissue or cotton balls. Prim Dressing: Prisma 4.34 (in) 1 x Per Week/30 Days ary Discharge Instructions: Moisten w/normal saline or sterile water; Cover wound as directed. Do not remove from wound bed. Secondary Dressing: ABD Pad 5x9 (in/in) 1 x Per Week/30 Days Discharge Instructions: Cover with ABD pad Com pression Wrap: 3-LAYER WRAP - Profore Lite LF 3 Multilayer Compression Bandaging System 1 x Per Week/30 Days Discharge Instructions: Apply 3 multi-layer wrap as prescribed. WOUND #3: - Lower Leg Wound Laterality: Left, Lateral Prim Dressing: Prisma 4.34 (in) 3 x Per Week/30 Days ary Discharge Instructions: Moisten w/normal saline or sterile water; Cover wound as directed. Do not remove from wound bed. Secondary Dressing: Zetuvit Plus 4x8 (in/in) 3 x Per Week/30 Days Com pression Wrap: 3-LAYER WRAP - Profore Lite LF 3 Multilayer Compression Bandaging System 3 x Per Week/30 Days Discharge Instructions: Apply 3 multi-layer wrap as prescribed. 1. I am going to go ahead and switch to collagen based  dressing for both extremities. I think this is probably to be the best way to go. 2. I am also can recommend that the patient should continue to monitor for any signs of infection or worsening. Obviously based on what I am seeing right now I do believe that the Zetuvit over top of the Prisma followed by 3 layer compression wrap bilaterally should be appropriate for the patient currently. 3. I am going also recommend that she continue to elevate her legs much as possible to help with edema control. We will see patient back for reevaluation in 1 week here in the clinic. If anything worsens or changes patient will contact our office for additional recommendations. Electronic Signature(s) Signed: 02/12/2022 2:55:27 PM By: Lenda Kelp PA-C Entered By: Lenda Kelp on 02/12/2022 14:55:27 -------------------------------------------------------------------------------- SuperBill Details Patient Name: Date of Service: [name] 02/12/2022 Medical Record Number: [id number] Patient Account  Number: [id number] Date of Birth/Sex: Treating RN: [birth year] (89 y.o. [name] Primary Care Provider: Lynnea Fox Other Clinician: Betha Fox Referring Provider: Treating Provider/Extender: [name] in Treatment: 5 Diagnosis Coding ICD-10 Codes Code Description [phone] Chronic venous hypertension (idiopathic) with ulcer and inflammation of right lower extremity G60.3 Idiopathic progressive neuropathy L97.812 Non-pressure chronic ulcer of other part of right lower leg with fat layer exposed N18.30 Chronic kidney disease, stage 3 unspecified I10 Essential (primary) hypertension Facility Procedures : CPT4 Code: [id number] Description: 11042 - DEB SUBQ TISSUE 20 SQ CM/< ICD-10 Diagnosis Description L97.812 Non-pressure chronic ulcer of other part of right lower leg with fat layer expo Modifier: sed Quantity: 1 Physician Procedures : CPT4 Code Description  Modifier [id number] 11042 - WC PHYS SUBQ TISS 20 SQ CM ICD-10 Diagnosis Description CAYA, SOBERANIS ([id number]) [id number] P ICD-10 Diagnosis Description L97.812 Non-pressure chronic ulcer of other  part of right lower leg with fat layer exposed Quantity: 1 age 42 of 37 Electronic Signature(s) Signed: 02/12/2022 2:55:34 PM By: Lenda Kelp PA-C Entered By: Lenda Kelp on 02/12/2022 14:55:34

## 2022-02-12 NOTE — Progress Notes (Signed)
LILLIA, LENGEL ([id number]) [id number] Page 1 of 11 Visit Report for 02/12/2022 Arrival Information Details Patient Name: Date of Service: RICKESHA, VERACRUZ 02/12/2022 2:00 PM Medical Record Number: [id number] Patient Account Number: [id number] Date of Birth/Sex: Treating RN: 28-Sep-1931 (89 y.o. [name] Primary Care Maricela Schreur: [name] Other Clinician: Betha Loa Referring Elexa Kivi: Treating Elayna Tobler/Extender: Trula Ore in Treatment: 5 Visit Information History Since Last Visit All ordered tests and consults were completed: No Patient Arrived: [name] Added or deleted any medications: No Arrival Time: 14:00 Any new allergies or adverse reactions: No Transfer Assistance: None Had a fall or experienced change in No Patient Identification Verified: Yes activities of daily living that may affect Secondary Verification Process Completed: Yes risk of falls: Patient Requires Transmission-Based Precautions: No Signs or symptoms of abuse/neglect since last visito No Patient Has Alerts: No Hospitalized since last visit: No Implantable device outside of the clinic excluding No cellular tissue based products placed in the center since last visit: Has Dressing in Place as Prescribed: Yes Has Compression in Place as Prescribed: Yes Pain Present Now: No Electronic Signature(s) Unsigned Entered ByBetha Loa on 02/12/2022 14:08:17 -------------------------------------------------------------------------------- Clinic Level of Care Assessment Details Patient Name: Date of Service: CHARLOTTE, FIDALGO 02/12/2022 2:00 PM Medical Record Number: [id number] Patient Account Number: [id number] Date of Birth/Sex: Treating RN: 11-24-1931 (89 y.o. [name] Primary Care Braidyn Peace: [name] Other Clinician: Betha Loa Referring Magdelene Ruark: Treating Omah Dewalt/Extender: Trula Ore in  Treatment: 5 Clinic Level of Care Assessment Items TOOL 1 Quantity Score []  - 0 Use when EandM and Procedure is performed on INITIAL visit ASSESSMENTS - Nursing Assessment / Reassessment TALEIA, SADOWSKI ([name]) [id number] Page 2 of 11 []  - 0 General Physical Exam (combine w/ comprehensive assessment (listed just below) when performed on new pt. evals) []  - 0 Comprehensive Assessment (HX, ROS, Risk Assessments, Wounds Hx, etc.) ASSESSMENTS - Wound and Skin Assessment / Reassessment []  - 0 Dermatologic / Skin Assessment (not related to wound area) ASSESSMENTS - Ostomy and/or Continence Assessment and Care []  - 0 Incontinence Assessment and Management []  - 0 Ostomy Care Assessment and Management (repouching, etc.) PROCESS - Coordination of Care []  - 0 Simple Patient / Family Education for ongoing care []  - 0 Complex (extensive) Patient / Family Education for ongoing care []  - 0 Staff obtains [id number], Records, T Results / Process Orders est []  - 0 Staff telephones HHA, Nursing Homes / Clarify orders / etc []  - 0 Routine Transfer to another Facility (non-emergent condition) []  - 0 Routine Hospital Admission (non-emergent condition) []  - 0 New Admissions / / Ordering NPWT Apligraf, etc. , []  - 0 Emergency Hospital Admission (emergent condition) PROCESS - Special Needs []  - 0 Pediatric / Minor Patient Management []  - 0 Isolation Patient Management []  - 0 Hearing / Language / Visual special needs []  - 0 Assessment of Community assistance (transportation, D/C planning, etc.) []  - 0 Additional assistance / Altered mentation []  - 0 Support Surface(s) Assessment (bed, cushion, seat, etc.) INTERVENTIONS - Miscellaneous []  - 0 External ear exam []  - 0 Patient Transfer (multiple staff / / Similar devices) []  - 0 Simple Staple / Suture removal (25 or less) []  - 0 Complex Staple / Suture removal (26 or  more) []  - 0 Hypo/Hyperglycemic Management (do not check if billed separately) []  - 0 Ankle / Brachial Index (ABI) - do not check if billed separately Has the patient been seen  at the hospital within the last three years: Yes Total Score: 0 Level Of Care: ____ Electronic Signature(s) Unsigned Entered By: Betha Loa on 02/12/2022 14:47:39 -------------------------------------------------------------------------------- Compression Therapy Details Patient Name: Date of Service: ASLEY, [place] 02/12/2022 2:00 PM [name] ([id number]) [id number] Page 3 of 11 Medical Record Number: [id number] Patient Account Number: [id number] Date of Birth/Sex: Treating RN: 11/10/1931 (89 y.o. [name] Primary Care Manessa Buley: [name] Other Clinician: Betha Loa Referring Raelie Lohr: Treating Chelesea Weiand/Extender: Wilma Flavin Weeks in Treatment: 5 Compression Therapy Performed for Wound Assessment: Wound #1 Right,Lateral Lower Leg Performed By: Farrel Gordon, Angie, Compression Type: Three Layer Pre Treatment ABI: 0.9 Post Procedure Diagnosis Same as Pre-procedure Electronic Signature(s) Unsigned Entered By: Betha Loa on 02/12/2022 14:42:04 -------------------------------------------------------------------------------- Compression Therapy Details Patient Name: Date of Service: GABRYELLE, WHITMOYER 02/12/2022 2:00 PM Medical Record Number: [id number] Patient Account Number: [id number] Date of Birth/Sex: Treating RN: 1931-07-15 (89 y.o. [name] Primary Care Kyandra Mcclaine: [name] Other Clinician: Betha Loa Referring Brendaly Townsel: Treating Ulysees Robarts/Extender: Wilma Flavin Weeks in Treatment: 5 Compression Therapy Performed for Wound Assessment: Wound #3 Left,Lateral Lower Leg Performed By: Farrel Gordon, Angie, Compression Type: Three Layer Post Procedure Diagnosis Same as  Pre-procedure Notes per Allen Derry, ok to wrap Electronic Signature(s) Unsigned Entered By: Betha Loa on 02/12/2022 14:47:21 -------------------------------------------------------------------------------- Encounter Discharge Information Details Patient Name: Date of Service: INDIRA, SORENSON 02/12/2022 2:00 PM [name] ([id number]) [id number] Page 4 of 11 Medical Record Number: [id number] Patient Account Number: [id number] Date of Birth/Sex: Treating RN: [birth year] (89 y.o. [name] Primary Care Kawehi Hostetter: [name] Other Clinician: Betha Loa Referring Makina Skow: Treating Neven Fina/Extender: Trula Ore in Treatment: 5 Encounter Discharge Information Items Post Procedure Vitals Discharge Condition: Stable Temperature (F): 97.9 Ambulatory Status: Walker Pulse (bpm): 70 Discharge Destination: Home Respiratory Rate (breaths/min): 18 Transportation: Private Auto Blood Pressure (mmHg): 155/73 Accompanied By: son Schedule Follow-up Appointment: Yes Clinical Summary of Care: Electronic Signature(s) Unsigned Entered ByBetha Loa on 02/12/2022 15:09:21 -------------------------------------------------------------------------------- Lower Extremity Assessment Details Patient Name: Date of Service: MACI, EICKHOLT 02/12/2022 2:00 PM Medical Record Number: [id number] Patient Account Number: [id number] Date of Birth/Sex: Treating RN: 08/23/1931 (89 y.o. [name] Primary Care Talita Recht: [name] Other Clinician: Betha Loa Referring Octavian Godek: Treating Delroy Ordway/Extender: Wilma Flavin Weeks in Treatment: 5 Edema Assessment Assessed: [Left: Yes] [Right: Yes] Edema: [Left: Yes] [Right: Yes] Calf Left: Right: Point of Measurement: 32 cm From Medial Instep 34 cm 33 cm Ankle Left: Right: Point of Measurement: 11 cm From Medial Instep 23.5 cm 22.1 cm Vascular  Assessment Pulses: Dorsalis Pedis Palpable: [Left:Yes] [Right:Yes] Electronic Signature(s) Unsigned Entered ByBetha Loa on 02/12/2022 14:32:37 Signature(s): [name] ([id number]) [id number] Date(s): [id number].pdf Page 5 of 11 -------------------------------------------------------------------------------- Multi Wound Chart Details Patient Name: Date of Service: ANNALISSE, MINKOFF 02/12/2022 2:00 PM Medical Record Number: [id number] Patient Account Number: [id number] Date of Birth/Sex: Treating RN: 1931-07-17 (89 y.o. [name] Primary Care Teale Goodgame: [name] Other Clinician: Betha Loa Referring Johnrobert Foti: Treating Kenyata Napier/Extender: Trula Ore in Treatment: 5 Vital Signs Height(in): Pulse(bpm): 70 Weight(lbs): 140 Blood Pressure(mmHg): 155/73 Body Mass Index(BMI): Temperature(F): 97.9 Respiratory Rate(breaths/min): 18 [1:Photos:] [N/A:N/A] Right, Lateral Lower Leg Left, Lateral Lower Leg N/A Wound Location: Gradually Appeared Skin T ear/Laceration N/A Wounding Event: Venous Leg Ulcer Skin T ear N/A Primary Etiology: Hypertension Hypertension N/A Comorbid History: 10/05/2021 01/21/2022 N/A Date Acquired: 5 3 N/A Weeks of Treatment: Open Open N/A  Wound Status: No No N/A Wound Recurrence: 1.7x2x0.1 3.7x0.7x0.1 N/A Measurements L x W x D (cm) 2.67 2.034 N/A A (cm) : rea 0.267 0.203 N/A Volume (cm) : 83.00% 19.10% N/A % Reduction in A rea: 91.50% 19.10% N/A % Reduction in Volume: Full Thickness Without Exposed Full Thickness Without Exposed N/A Classification: Support Structures Support Structures Medium Medium N/A Exudate Amount: Serosanguineous Serosanguineous N/A Exudate Type: red, brown red, brown N/A Exudate Color: Medium (34-66%) Medium (34-66%) N/A Granulation Amount: Red Pink N/A Granulation Quality: Medium (34-66%) Medium (34-66%) N/A Necrotic Amount: Fat Layer  (Subcutaneous Tissue): Yes Fat Layer (Subcutaneous Tissue): Yes N/A Exposed Structures: Fascia: No Fascia: No Tendon: No Tendon: No Muscle: No Muscle: No Joint: No Joint: No Bone: No Bone: No None None N/A Epithelialization: Treatment Notes Electronic Signature(s) Unsigned Entered By: Betha Loa on 02/12/2022 14:32:50 Signature(s): [name] ([id number]) [id number] Date(s): [id number] Page 6 of 11 -------------------------------------------------------------------------------- Multi-Disciplinary Care Plan Details Patient Name: Date of Service: SYENNA, NAZIR 02/12/2022 2:00 PM Medical Record Number: [id number] Patient Account Number: [id number] Date of Birth/Sex: Treating RN: Nov 29, 1931 (89 y.o. [name] Primary Care Stanlee Roehrig: [name] Other Clinician: Betha Loa Referring Jayce Kainz: Treating Tiahna Cure/Extender: Trula Ore in Treatment: 5 Active Inactive Abuse / Safety / Falls / Self Care Management Nursing Diagnoses: Potential for injury related to falls Goals: Patient will remain injury free related to falls Date Initiated: 01/07/2022 Target Resolution Date: 02/07/2022 Goal Status: Active Interventions: Assess Activities of Daily Living upon admission and as needed Assess fall risk on admission and as needed Assess: immobility, friction, shearing, incontinence upon admission and as needed Assess impairment of mobility on admission and as needed per policy Assess personal safety and home safety (as indicated) on admission and as needed Assess self care needs on admission and as needed Notes: Wound/Skin Impairment Nursing Diagnoses: Knowledge deficit related to ulceration/compromised skin integrity Goals: Patient/caregiver will verbalize understanding of skin care regimen Date Initiated: 01/07/2022 Target Resolution Date: 02/07/2022 Goal Status: Active Ulcer/skin breakdown will have a volume  reduction of 30% by week 4 Date Initiated: 01/07/2022 Target Resolution Date: 02/07/2022 Goal Status: Active Ulcer/skin breakdown will have a volume reduction of 50% by week 8 Date Initiated: 01/07/2022 Target Resolution Date: 03/10/2022 Goal Status: Active Ulcer/skin breakdown will have a volume reduction of 80% by week 12 Date Initiated: 01/07/2022 Target Resolution Date: 04/08/2022 Goal Status: Active Ulcer/skin breakdown will heal within 14 weeks Date Initiated: 01/07/2022 Target Resolution Date: 05/10/2022 Goal Status: Active Interventions: Assess patient/caregiver ability to obtain necessary supplies Assess patient/caregiver ability to perform ulcer/skin care regimen upon admission and as needed Assess ulceration(s) every visit Notes: SHALINA, NORFOLK ([id number]) [id number] Page 7 of 11 Electronic Signature(s) Unsigned Entered By: Betha Loa on 02/12/2022 15:08:36 -------------------------------------------------------------------------------- Pain Assessment Details Patient Name: Date of Service: VENA, BASSINGER 02/12/2022 2:00 PM Medical Record Number: [id number] Patient Account Number: [id number] Date of Birth/Sex: Treating RN: 1931-11-26 (89 y.o. [name] Primary Care Hershey Knauer: [name] Other Clinician: Betha Loa Referring Naheem Mosco: Treating Meggin Ola/Extender: Trula Ore in Treatment: 5 Active Problems Location of Pain Severity and Description of Pain Patient Has Paino No Site Locations Pain Management and Medication Current Pain Management: Electronic Signature(s) Unsigned Entered By: Betha Loa on 02/12/2022 14:10:34 Signature(s): [name] ([id number]) 1234 Date(s): [id number] Page 8 of 11 -------------------------------------------------------------------------------- Patient/Caregiver Education Details Patient Name: Date of Service: MICAIAH, REMILLARD  1/9/2024andnbsp2:00 PM Medical Record Number: [id number] Patient Account Number: [id number] Date of Birth/Gender: Treating  RN: [birth year] (89 y.o. [name] Primary Care Physician: [name] Other Clinician: Betha Loa Referring Physician: Treating Physician/Extender: Trula Ore in Treatment: 5 Education Assessment Education Provided To: Patient Education Topics Provided Wound/Skin Impairment: Handouts: Other: continue wound care as directed Methods: Explain/Verbal Responses: State content correctly Electronic Signature(s) Unsigned Entered By: Betha Loa on 02/12/2022 15:08:32 -------------------------------------------------------------------------------- Wound Assessment Details Patient Name: Date of Service: AARIANNA, HOADLEY 02/12/2022 2:00 PM Medical Record Number: [id number] Patient Account Number: [id number] Date of Birth/Sex: Treating RN: Aug 11, 1931 (89 y.o. [name] Primary Care Kerissa Coia: [name] Other Clinician: Betha Loa Referring Colonel Krauser: Treating Efrem Pitstick/Extender: Wilma Flavin Weeks in Treatment: 5 Wound Status Wound Number: 1 Primary Etiology: Venous Leg Ulcer Wound Location: Right, Lateral Lower Leg Wound Status: Open Wounding Event: Gradually Appeared Comorbid History: Hypertension Date Acquired: 10/05/2021 Weeks Of Treatment: 5 Clustered Wound: No Photos Wound Measurements LATEASHA, BREUER ([id number]) Length: (cm) 1.7 Width: (cm) 2 Depth: (cm) 0.1 Area: (cm) 2.67 Volume: (cm) 0.267 [id number] Page 9 of 11 % Reduction in Area: 83% % Reduction in Volume: 91.5% Epithelialization: None Wound Description Classification: Full Thickness Without Exposed Supp Exudate Amount: Medium Exudate Type: Serosanguineous Exudate Color: red, brown ort Structures Foul Odor After Cleansing: No Slough/Fibrino Yes Wound Bed Granulation Amount: Medium  (34-66%) Exposed Structure Granulation Quality: Red Fascia Exposed: No Necrotic Amount: Medium (34-66%) Fat Layer (Subcutaneous Tissue) Exposed: Yes Necrotic Quality: Adherent Slough Tendon Exposed: No Muscle Exposed: No Joint Exposed: No Bone Exposed: No Treatment Notes Wound #1 (Lower Leg) Wound Laterality: Right, Lateral Cleanser Wound Cleanser Discharge Instruction: Wash your hands with soap and water. Remove old dressing, discard into plastic bag and place into trash. Cleanse the wound with Wound Cleanser prior to applying a clean dressing using gauze sponges, not tissues or cotton balls. Do not scrub or use excessive force. Pat dry using gauze sponges, not tissue or cotton balls. Peri-Wound Care Topical Primary Dressing Prisma 4.34 (in) Discharge Instruction: Moisten w/normal saline or sterile water; Cover wound as directed. Do not remove from wound bed. Secondary Dressing ABD Pad 5x9 (in/in) Discharge Instruction: Cover with ABD pad Secured With Compression Wrap 3-LAYER WRAP - Profore Lite LF 3 Multilayer Compression Bandaging System Discharge Instruction: Apply 3 multi-layer wrap as prescribed. Compression Stockings Add-Ons Electronic Signature(s) Unsigned Entered By: Betha Loa on 02/12/2022 14:28:43 -------------------------------------------------------------------------------- Wound Assessment Details Patient Name: Date of Service: ZI, SEK 02/12/2022 2:00 PM GWYNEVERE, LIZANA ([id number]) [id number] Page 10 of 11 Medical Record Number: [id number] Patient Account Number: [id number] Date of Birth/Sex: Treating RN: [birth year] (89 y.o. [name] Primary Care Bernal Luhman: [name] Other Clinician: Betha Loa Referring Sailor Hevia: Treating Genae Strine/Extender: Wilma Flavin Weeks in Treatment: 5 Wound Status Wound Number: 3 Primary Etiology: Skin Tear Wound Location: Left, Lateral Lower Leg Wound  Status: Open Wounding Event: Skin Tear/Laceration Comorbid History: Hypertension Date Acquired: 01/21/2022 Weeks Of Treatment: 3 Clustered Wound: No Photos Wound Measurements Length: (cm) 3.7 Width: (cm) 0.7 Depth: (cm) 0.1 Area: (cm) 2.034 Volume: (cm) 0.203 % Reduction in Area: 19.1% % Reduction in Volume: 19.1% Epithelialization: None Wound Description Classification: Full Thickness Without Exposed Suppor Exudate Amount: Medium Exudate Type: Serosanguineous Exudate Color: red, brown t Structures Foul Odor After Cleansing: No Slough/Fibrino Yes Wound Bed Granulation Amount: Medium (34-66%) Exposed Structure Granulation Quality: Pink Fascia Exposed: No Necrotic Amount: Medium (34-66%) Fat Layer (Subcutaneous Tissue) Exposed: Yes Necrotic Quality: Adherent Slough Tendon Exposed: No Muscle Exposed: No Joint Exposed: No Bone Exposed:  No Treatment Notes Wound #3 (Lower Leg) Wound Laterality: Left, Lateral Cleanser Peri-Wound Care Topical Primary Dressing Prisma 4.34 (in) Discharge Instruction: Moisten w/normal saline or sterile water; Cover wound as directed. Do not remove from wound bed. Secondary Dressing Zetuvit Plus 4x8 (in/in) Secured With Compression Wrap 3-LAYER WRAP - Profore Lite LF 3 Multilayer Compression Bandaging System Discharge Instruction: Apply 3 multi-layer wrap as prescribed. Compression Stockings Add-Ons KIMARI, LIENHARD ([id number]) [id number] Page 11 of 11 Electronic Signature(s) Unsigned Entered By: Massie Kluver on 02/12/2022 14:30:37 -------------------------------------------------------------------------------- Vitals Details Patient Name: Date of Service: MAYCE, NOYES 02/12/2022 2:00 PM Medical Record Number: [id number] Patient Account Number: [id number] Date of Birth/Sex: Treating RN: [birth year] (89 y.o. [name] Primary Care Zymire Turnbo: Jenna Luo Other Clinician: Massie Kluver Referring Nichlas Pitera: Treating Ann Bohne/Extender: Barbette Merino in Treatment: 5 Vital Signs Time Taken: 14:08 Temperature (F): 97.9 Weight (lbs): 140 Pulse (bpm): 70 Respiratory Rate (breaths/min): 18 Blood Pressure (mmHg): 155/73 Reference Range: 80 - 120 mg / dl Electronic Signature(s) Unsigned Entered ByMassie Kluver on 02/12/2022 14:10:29 Signature(s): Date(s):

## 2022-02-14 ENCOUNTER — Telehealth: Payer: Self-pay

## 2022-02-14 NOTE — Telephone Encounter (Signed)
Pt called and states that she is only getting 30 of her Xanax. Pt states that she takes 2 every night to help her sleep and a quantity of 30 only gives her 15 days. Can an increased quantity be sent in for her. Last RF was 01/31/2022. Thank you.

## 2022-02-19 ENCOUNTER — Encounter: Payer: 59 | Admitting: Physician Assistant

## 2022-02-19 DIAGNOSIS — G603 Idiopathic progressive neuropathy: Secondary | ICD-10-CM | POA: Diagnosis not present

## 2022-02-19 DIAGNOSIS — I129 Hypertensive chronic kidney disease with stage 1 through stage 4 chronic kidney disease, or unspecified chronic kidney disease: Secondary | ICD-10-CM | POA: Diagnosis not present

## 2022-02-19 DIAGNOSIS — N183 Chronic kidney disease, stage 3 unspecified: Secondary | ICD-10-CM | POA: Diagnosis not present

## 2022-02-19 DIAGNOSIS — I872 Venous insufficiency (chronic) (peripheral): Secondary | ICD-10-CM | POA: Diagnosis not present

## 2022-02-19 DIAGNOSIS — L97812 Non-pressure chronic ulcer of other part of right lower leg with fat layer exposed: Secondary | ICD-10-CM | POA: Diagnosis not present

## 2022-02-19 DIAGNOSIS — L97822 Non-pressure chronic ulcer of other part of left lower leg with fat layer exposed: Secondary | ICD-10-CM | POA: Diagnosis not present

## 2022-02-19 DIAGNOSIS — S81812A Laceration without foreign body, left lower leg, initial encounter: Secondary | ICD-10-CM | POA: Diagnosis not present

## 2022-02-19 DIAGNOSIS — I87331 Chronic venous hypertension (idiopathic) with ulcer and inflammation of right lower extremity: Secondary | ICD-10-CM | POA: Diagnosis not present

## 2022-02-19 NOTE — Progress Notes (Addendum)
[name], [name] ([id number]) [id number] Page 1 of 9 Visit Report for 02/19/2022 Chief Complaint Document Details Patient Name: Date of Service: [name], [name]. 02/19/2022 2:45 PM Medical Record Number: [id number] Patient Account Number: [id number] Date of Birth/Sex: Treating RN: 04/24/1931 (89 y.o. Skip MayerF) Woody, Kim Primary Care Provider: Lynnea FerrierPickard, Warren Other Clinician: Betha LoaVenable, Angie Referring Provider: Treating Provider/Extender: Trula OreStone, Salma Walrond Pickard, Warren Weeks in Treatment: 6 Information Obtained from: Patient Chief Complaint Right LE Ulcer Electronic Signature(s) Signed: 02/19/2022 3:20:46 PM By: Lenda KelpStone III, Eiko Mcgowen PA-C Entered By: Lenda KelpStone III, Jozelyn Kuwahara on 02/19/2022 15:20:46 -------------------------------------------------------------------------------- Debridement Details Patient Name: Date of Service: [name], [name]. 02/19/2022 2:45 PM Medical Record Number: [id number] Patient Account Number: [id number] Date of Birth/Sex: Treating RN: 03/11/1931 (89 y.o. Skip MayerF) Woody, Kim Primary Care Provider: Lynnea FerrierPickard, Warren Other Clinician: Betha LoaVenable, Angie Referring Provider: Treating Provider/Extender: Trula OreStone, Dontay Harm Pickard, Warren Weeks in Treatment: 6 Debridement Performed for Assessment: Wound #3 Left,Lateral Lower Leg Performed By: Physician Nelida MeuseStone, Lucinda Spells E., PA-C Debridement Type: Debridement Level of Consciousness (Pre-procedure): Awake and Alert Pre-procedure Verification/Time Out Yes - 15:51 Taken: Start Time: 15:51 T Area Debrided (Fox x W): otal 3.4 (cm) x 0.9 (cm) = 3.06 (cm) Tissue and other material debrided: Viable, Non-Viable, Slough, Subcutaneous, Slough Level: Skin/Subcutaneous Tissue Debridement Description: Excisional Instrument: Curette Bleeding: Minimum Hemostasis Achieved: Pressure Response to Treatment: Procedure was tolerated well Level of Consciousness (Post- Awake and Alert procedure): Post Debridement Measurements of Total  Wound [name], [name] ([id number]) [id number] Page 2 of 9 Length: (cm) 3.4 Width: (cm) 0.9 Depth: (cm) 0.3 Volume: (cm) 0.721 Character of Wound/Ulcer Post Debridement: Stable Post Procedure Diagnosis Same as Pre-procedure Electronic Signature(s) Signed: 02/19/2022 5:09:00 PM By: Lenda KelpStone III, Debora Stockdale PA-C Signed: 02/20/2022 11:43:47 PM By: Elliot GurneyWoody, BSN, RN, CWS, Kim RN, BSN Signed: 02/22/2022 9:50:53 AM By: Betha LoaVenable, Angie Entered By: Betha LoaVenable, Angie on 02/19/2022 15:53:03 -------------------------------------------------------------------------------- Debridement Details Patient Name: Date of Service: [name], [name]. 02/19/2022 2:45 PM Medical Record Number: [id number] Patient Account Number: [id number] Date of Birth/Sex: Treating RN: 06/24/1931 (89 y.o. Cathlean CowerF) Woody, Kim Primary Care Provider: Lynnea FerrierPickard, Warren Other Clinician: Betha LoaVenable, Angie Referring Provider: Treating Provider/Extender: Trula OreStone, Pinchus Weckwerth Pickard, Warren Weeks in Treatment: 6 Debridement Performed for Assessment: Wound #1 Right,Lateral Lower Leg Performed By: Physician Nelida MeuseStone, Sriansh Farra E., PA-C Debridement Type: Debridement Severity of Tissue Pre Debridement: Fat layer exposed Level of Consciousness (Pre-procedure): Awake and Alert Pre-procedure Verification/Time Out Yes - 15:54 Taken: Start Time: 15:54 T Area Debrided (Fox x W): otal 1.7 (cm) x 1.5 (cm) = 2.55 (cm) Tissue and other material debrided: Viable, Non-Viable, Slough, Subcutaneous, Slough Level: Skin/Subcutaneous Tissue Debridement Description: Excisional Instrument: Curette Bleeding: Minimum Hemostasis Achieved: Pressure Response to Treatment: Procedure was tolerated well Level of Consciousness (Post- Awake and Alert procedure): Post Debridement Measurements of Total Wound Length: (cm) 1.7 Width: (cm) 1.5 Depth: (cm) 0.2 Volume: (cm) 0.401 Character of Wound/Ulcer Post Debridement: Stable Severity of Tissue Post Debridement: Fat  layer exposed Post Procedure Diagnosis Same as Pre-procedure Electronic Signature(s) Signed: 02/19/2022 5:09:00 PM By: Lenda KelpStone III, Gumaro Brightbill PA-C Signed: 02/20/2022 11:43:47 PM By: Elliot GurneyWoody, BSN, RN, CWS, Kim RN, BSN Signed: 02/22/2022 9:50:53 AM By: Betha LoaVenable, Angie Entered By: Betha LoaVenable, Angie on 02/19/2022 15:54:50 [name], [name] ([id number]) [id number] Page 3 of 9 -------------------------------------------------------------------------------- HPI Details Patient Name: Date of Service: [name], [name]. 02/19/2022 2:45 PM Medical Record Number: [id number] Patient Account Number: [id number] Date of Birth/Sex: Treating RN: 11/13/1931 (89 y.o. Skip MayerF) Woody, Kim Primary Care Provider: Lynnea FerrierPickard, Warren Other Clinician: Betha LoaVenable, Angie Referring Provider: Treating Provider/Extender: Allen DerryStone, Maleeka Sabatino  [name] Weeks in Treatment: 6 History of Present Illness HPI Description: 01-07-2022 upon evaluation today patient appears to be doing poorly in regard to the wound on the right lateral lower extremity. She tells me that this occurred after a fall that she sustained on and around Labor Day weekend. Subsequently following this fall she tells me that she had a wound that really has not improved significantly. She has been seeing her primary care provider and they have been attempting to do what they could to get this improving overall. With that being said the patient has been on Silvadene cream, Neosporin, and more recently soaking with peroxide then wet to dry gauze recommended by primary care provider. Lastly Santyl was advised but they have not been able to get that filled as of yet. Patient does have a history of neuropathy, chronic kidney disease stage III, and hypertension along with chronic venous insufficiency otherwise she is fairly healthy and has nothing else that should affect her ability to heal. 01-14-2022 upon evaluation today patient appears to be doing well currently  in regard to her wound. She has been tolerating the dressing changes without complication. With that being said she still has quite a bit of necrotic tissue buildup on the surface of the wound unfortunately. 01-21-2022 upon evaluation today patient actually is seeming to make excellent progress in regard to her wound. She is tolerating the dressing changes and the Iodoflex seems to be doing a great job at this point. Fortunately I do not see [name] signs of active infection at this time. 12/26; wound on the right lateral lower leg which was initially trauma is quite a bit better down to half a centimeter in both dimensions. It has no depth. We are using Iodoflex under 3 layer compression. On the left leg she has a skin tear we have been using Xeroform this also looks quite good. The patient probably has chronic venous insufficiency although she also takes prednisone 10 mg a day adding to the overall fragility of her skin 02-12-2022 upon evaluation today patient appears to be doing well currently in regard to her wound. She has been tolerating the dressing changes without complication. The right leg is looking better the left leg is not feeling quite as much as we would like to see. I think that she has some necrotic tissue that needs to be cleaned away to be honest. Fortunately I do not see [name] signs of active infection locally nor systemically at this time which is great news. 02-19-2021 upon evaluation today patient's wounds actually are showing signs of improvement which is great news. Fortunately I do not see [name] signs of active infection locally or systemically which is great news. No fevers, chills, nausea, vomiting, or diarrhea. Upon inspection patient's wound actually is showing signs of improvement although she tells me that the left leg did have more discomfort after last week although did have to perform a fairly significant debridement last week. Electronic Signature(s) Signed: 02/19/2022 3:56:54  PM By: Lenda Kelp PA-C Entered By: Lenda Kelp on 02/19/2022 15:56:54 -------------------------------------------------------------------------------- Physical Exam Details Patient Name: Date of Service: [name], [name] 02/19/2022 2:45 PM Medical Record Number: [id number] Patient Account Number: [id number] Date of Birth/Sex: Treating RN: 1931-12-18 (89 y.o. Skip Mayer Primary Care Provider: [name] Other Clinician: Betha Loa Referring Provider: Treating Provider/Extender: Trula Ore in Treatment: 6 [name], [name] ([id number]) [id number] Page 4 of 9 Constitutional Well-nourished and well-hydrated in no acute distress. Respiratory  normal breathing without difficulty. Psychiatric this patient is able to make decisions and demonstrates good insight into disease process. Alert and Oriented x 3. pleasant and cooperative. Notes Upon inspection patient's wound bed actually showed signs of good granulation and epithelization at this point. Fortunately I do not see [name] signs of infection there was some need for sharp debridement I performed debridement today lightly without complication she tolerated that today and postdebridement the wound bed is significantly improved. Electronic Signature(s) Signed: 02/19/2022 3:57:15 PM By: [name] PA-C Entered By: [name] on 02/19/2022 15:57:15 -------------------------------------------------------------------------------- Physician Orders Details Patient Name: Date of Service: [name], [name] 02/19/2022 2:45 PM Medical Record Number: [id number] Patient Account Number: [id number] Date of Birth/Sex: Treating RN: 14-Nov-1931 (89 y.o. Charolette Forward, Kim Primary Care Provider: Jenna Luo Other Clinician: Massie Kluver Referring Provider: Treating Provider/Extender: Barbette Merino in Treatment: 6 Verbal / Phone Orders: No Diagnosis  Coding ICD-10 Coding Code Description [phone] Chronic venous hypertension (idiopathic) with ulcer and inflammation of right lower extremity G60.3 Idiopathic progressive neuropathy L97.812 Non-pressure chronic ulcer of other part of right lower leg with fat layer exposed N18.30 Chronic kidney disease, stage 3 unspecified I10 Essential (primary) hypertension Follow-up Appointments Return Appointment in 1 week. Bathing/ Shower/ Hygiene May shower with wound dressing protected with water repellent cover or cast protector. No tub bath. Anesthetic (Use 'Patient Medications' Section for Anesthetic Order Entry) Lidocaine applied to wound bed Edema Control - Lymphedema / Segmental Compressive Device / Other Optional: One layer of unna paste to top of compression wrap (to act as an anchor). Elevate, Exercise Daily and A void Standing for Long Periods of Time. Elevate legs to the level of the heart and pump ankles as often as possible Elevate leg(s) parallel to the floor when sitting. Wound Treatment Wound #1 - Lower Leg Wound Laterality: Right, Lateral Cleanser: Wound Cleanser (Generic) 1 x Per Week/30 Days Discharge Instructions: Wash your hands with soap and water. Remove old dressing, discard into plastic bag and place into trash. Cleanse the wound with Wound Cleanser prior to applying a clean dressing using gauze sponges, not tissues or cotton balls. Do not scrub or use excessive force. Pat dry using gauze sponges, not tissue or cotton balls. TELITHA, PLATH ([id number]) [id number] Page 5 of 9 Prim Dressing: Prisma 4.34 (in) 1 x Per Week/30 Days ary Discharge Instructions: Moisten w/normal saline or sterile water; Cover wound as directed. Do not remove from wound bed. Secondary Dressing: ABD Pad 5x9 (in/in) 1 x Per Week/30 Days Discharge Instructions: Cover with ABD pad Compression Wrap: 3-LAYER WRAP - Profore Lite LF 3 Multilayer Compression Bandaging System  1 x Per Week/30 Days Discharge Instructions: Apply 3 multi-layer wrap as prescribed. Wound #3 - Lower Leg Wound Laterality: Left, Lateral Prim Dressing: Prisma 4.34 (in) 3 x Per Week/30 Days ary Discharge Instructions: Moisten w/normal saline or sterile water; Cover wound as directed. Do not remove from wound bed. Secondary Dressing: Zetuvit Plus 4x8 (in/in) 3 x Per Week/30 Days Compression Wrap: 3-LAYER WRAP - Profore Lite LF 3 Multilayer Compression Bandaging System 3 x Per Week/30 Days Discharge Instructions: Apply 3 multi-layer wrap as prescribed. Electronic Signature(s) Signed: 02/19/2022 5:09:00 PM By: [name] PA-C Signed: 02/22/2022 9:50:53 AM By: Massie Kluver Entered By: Massie Kluver on 02/19/2022 15:55:55 -------------------------------------------------------------------------------- Problem List Details Patient Name: Date of Service: [name] 02/19/2022 2:45 PM Medical Record Number: [id number] Patient Account Number: [id number] Date of Birth/Sex: Treating RN: 1931-06-09 (89 y.o. F)  Huel Coventry Primary Care Provider: [name] Other Clinician: Betha Loa Referring Provider: Treating Provider/Extender: Trula Ore in Treatment: 6 Active Problems ICD-10 Encounter Code Description Active Date MDM Diagnosis I87.331 Chronic venous hypertension (idiopathic) with ulcer and inflammation of right 01/07/2022 No Yes lower extremity G60.3 Idiopathic progressive neuropathy 01/07/2022 No Yes L97.812 Non-pressure chronic ulcer of other part of right lower leg with fat layer 01/07/2022 No Yes exposed N18.30 Chronic kidney disease, stage 3 unspecified 01/07/2022 No Yes I10 Essential (primary) hypertension 01/07/2022 No Yes ALA, KRATZ ([id number]) [id number] Page 6 of 9 Inactive Problems Resolved Problems Electronic Signature(s) Signed: 02/19/2022 3:12:08 PM By: Lenda Kelp PA-C Entered By: Lenda Kelp on 02/19/2022 15:12:08 -------------------------------------------------------------------------------- Progress Note Details Patient Name: Date of Service: [name], [name] 02/19/2022 2:45 PM Medical Record Number: [id number] Patient Account Number: [id number] Date of Birth/Sex: Treating RN: July 23, 1931 (89 y.o. Skip Mayer Primary Care Provider: [name] Other Clinician: Betha Loa Referring Provider: Treating Provider/Extender: Trula Ore in Treatment: 6 Subjective Chief Complaint Information obtained from Patient Right LE Ulcer History of Present Illness (HPI) 01-07-2022 upon evaluation today patient appears to be doing poorly in regard to the wound on the right lateral lower extremity. She tells me that this occurred after a fall that she sustained on and around Labor Day weekend. Subsequently following this fall she tells me that she had a wound that really has not improved significantly. She has been seeing her primary care provider and they have been attempting to do what they could to get this improving overall. With that being said the patient has been on Silvadene cream, Neosporin, and more recently soaking with peroxide then wet to dry gauze recommended by primary care provider. Lastly Santyl was advised but they have not been able to get that filled as of yet. Patient does have a history of neuropathy, chronic kidney disease stage III, and hypertension along with chronic venous insufficiency otherwise she is fairly healthy and has nothing else that should affect her ability to heal. 01-14-2022 upon evaluation today patient appears to be doing well currently in regard to her wound. She has been tolerating the dressing changes without complication. With that being said she still has quite a bit of necrotic tissue buildup on the surface of the wound unfortunately. 01-21-2022 upon evaluation today patient actually is seeming to make  excellent progress in regard to her wound. She is tolerating the dressing changes and the Iodoflex seems to be doing a great job at this point. Fortunately I do not see [name] signs of active infection at this time. 12/26; wound on the right lateral lower leg which was initially trauma is quite a bit better down to half a centimeter in both dimensions. It has no depth. We are using Iodoflex under 3 layer compression. On the left leg she has a skin tear we have been using Xeroform this also looks quite good. The patient probably has chronic venous insufficiency although she also takes prednisone 10 mg a day adding to the overall fragility of her skin 02-12-2022 upon evaluation today patient appears to be doing well currently in regard to her wound. She has been tolerating the dressing changes without complication. The right leg is looking better the left leg is not feeling quite as much as we would like to see. I think that she has some necrotic tissue that needs to be cleaned away to be honest. Fortunately I do not see [name] signs  of active infection locally nor systemically at this time which is great news. 02-19-2021 upon evaluation today patient's wounds actually are showing signs of improvement which is great news. Fortunately I do not see [name] signs of active infection locally or systemically which is great news. No fevers, chills, nausea, vomiting, or diarrhea. Upon inspection patient's wound actually is showing signs of improvement although she tells me that the left leg did have more discomfort after last week although did have to perform a fairly significant debridement last week. Objective Constitutional [name], [name] ([id number]) [id number] Page 7 of 9 Well-nourished and well-hydrated in no acute distress. Vitals Time Taken: 3:21 PM, Weight: 140 lbs, Temperature: 98.1 F, Pulse: 77 bpm, Respiratory Rate: 16 breaths/min, Blood Pressure: 143/72  mmHg. Respiratory normal breathing without difficulty. Psychiatric this patient is able to make decisions and demonstrates good insight into disease process. Alert and Oriented x 3. pleasant and cooperative. General Notes: Upon inspection patient's wound bed actually showed signs of good granulation and epithelization at this point. Fortunately I do not see [name] signs of infection there was some need for sharp debridement I performed debridement today lightly without complication she tolerated that today and postdebridement the wound bed is significantly improved. Integumentary (Hair, Skin) Wound #1 status is Open. Original cause of wound was Gradually Appeared. The date acquired was: 10/05/2021. The wound has been in treatment 6 weeks. The wound is located on the Right,Lateral Lower Leg. The wound measures 1.7cm length x 1.5cm width x 0.2cm depth; 2.003cm^2 area and 0.401cm^3 volume. There is Fat Layer (Subcutaneous Tissue) exposed. There is a medium amount of serosanguineous drainage noted. There is medium (34-66%) red granulation within the wound bed. There is a medium (34-66%) amount of necrotic tissue within the wound bed including Adherent Slough. Wound #3 status is Open. Original cause of wound was Skin T ear/Laceration. The date acquired was: 01/21/2022. The wound has been in treatment 4 weeks. The wound is located on the Left,Lateral Lower Leg. The wound measures 3.4cm length x 0.9cm width x 0.2cm depth; 2.403cm^2 area and 0.481cm^3 volume. There is Fat Layer (Subcutaneous Tissue) exposed. There is a medium amount of serosanguineous drainage noted. There is medium (34-66%) pink granulation within the wound bed. There is a medium (34-66%) amount of necrotic tissue within the wound bed including Adherent Slough. Assessment Active Problems ICD-10 Chronic venous hypertension (idiopathic) with ulcer and inflammation of right lower extremity Idiopathic progressive neuropathy Non-pressure  chronic ulcer of other part of right lower leg with fat layer exposed Chronic kidney disease, stage 3 unspecified Essential (primary) hypertension Procedures Wound #1 Pre-procedure diagnosis of Wound #1 is a Venous Leg Ulcer located on the Right,Lateral Lower Leg .Severity of Tissue Pre Debridement is: Fat layer exposed. There was a Excisional Skin/Subcutaneous Tissue Debridement with a total area of 2.55 sq cm performed by Tommie Sams., PA-C. With the following instrument(s): Curette to remove Viable and Non-Viable tissue/material. Material removed includes Subcutaneous Tissue and Slough and. A time out was conducted at 15:54, prior to the start of the procedure. A Minimum amount of bleeding was controlled with Pressure. The procedure was tolerated well. Post Debridement Measurements: 1.7cm length x 1.5cm width x 0.2cm depth; 0.401cm^3 volume. Character of Wound/Ulcer Post Debridement is stable. Severity of Tissue Post Debridement is: Fat layer exposed. Post procedure Diagnosis Wound #1: Same as Pre-Procedure Pre-procedure diagnosis of Wound #1 is a Venous Leg Ulcer located on the Right,Lateral Lower Leg . There was a Three Layer Compression Therapy Procedure  with a pre-treatment ABI of 0.9 by Betha LoaVenable, Angie. Post procedure Diagnosis Wound #1: Same as Pre-Procedure Wound #3 Pre-procedure diagnosis of Wound #3 is a Skin T located on the Left,Lateral Lower Leg . There was a Excisional Skin/Subcutaneous Tissue Debridement with ear a total area of 3.06 sq cm performed by Nelida MeuseStone, Pema Thomure E., PA-C. With the following instrument(s): Curette to remove Viable and Non-Viable tissue/material. Material removed includes Subcutaneous Tissue and Slough and. A time out was conducted at 15:51, prior to the start of the procedure. A Minimum amount of bleeding was controlled with Pressure. The procedure was tolerated well. Post Debridement Measurements: 3.4cm length x 0.9cm width x 0.3cm depth; 0.721cm^3  volume. Character of Wound/Ulcer Post Debridement is stable. Post procedure Diagnosis Wound #3: Same as Pre-Procedure Pre-procedure diagnosis of Wound #3 is a Skin T located on the Left,Lateral Lower Leg . There was a Three Layer Compression Therapy Procedure by ear Glynda JaegerVenable, Angie. Post procedure Diagnosis Wound #3: Same as Pre-Procedure Plan Follow-up Appointments: Return Appointment in 1 week. Bathing/ Shower/ Hygiene: May shower with wound dressing protected with water repellent cover or cast protector. [name], [name] ([id number]) [id number] Page 8 of 9 No tub bath. Anesthetic (Use 'Patient Medications' Section for Anesthetic Order Entry): Lidocaine applied to wound bed Edema Control - Lymphedema / Segmental Compressive Device / Other: Optional: One layer of unna paste to top of compression wrap (to act as an anchor). Elevate, Exercise Daily and Avoid Standing for Long Periods of Time. Elevate legs to the level of the heart and pump ankles as often as possible Elevate leg(s) parallel to the floor when sitting. WOUND #1: - Lower Leg Wound Laterality: Right, Lateral Cleanser: Wound Cleanser (Generic) 1 x Per Week/30 Days Discharge Instructions: Wash your hands with soap and water. Remove old dressing, discard into plastic bag and place into trash. Cleanse the wound with Wound Cleanser prior to applying a clean dressing using gauze sponges, not tissues or cotton balls. Do not scrub or use excessive force. Pat dry using gauze sponges, not tissue or cotton balls. Prim Dressing: Prisma 4.34 (in) 1 x Per Week/30 Days ary Discharge Instructions: Moisten w/normal saline or sterile water; Cover wound as directed. Do not remove from wound bed. Secondary Dressing: ABD Pad 5x9 (in/in) 1 x Per Week/30 Days Discharge Instructions: Cover with ABD pad Com pression Wrap: 3-LAYER WRAP - Profore Lite LF 3 Multilayer Compression Bandaging System 1 x Per Week/30  Days Discharge Instructions: Apply 3 multi-layer wrap as prescribed. WOUND #3: - Lower Leg Wound Laterality: Left, Lateral Prim Dressing: Prisma 4.34 (in) 3 x Per Week/30 Days ary Discharge Instructions: Moisten w/normal saline or sterile water; Cover wound as directed. Do not remove from wound bed. Secondary Dressing: Zetuvit Plus 4x8 (in/in) 3 x Per Week/30 Days Com pression Wrap: 3-LAYER WRAP - Profore Lite LF 3 Multilayer Compression Bandaging System 3 x Per Week/30 Days Discharge Instructions: Apply 3 multi-layer wrap as prescribed. 1. I am going to recommend that we have the patient continue to monitor for [name] signs of infection or worsening. Based on what I am seeing I do believe that the patient is making good progress. 2. I am also can recommend the patient should continue to elevate her legs much as possible we are also using a 3 layer compression wrap bilaterally. 3. I would also suggest that she continue with the Zetuvit to cover on each side which is helping to catch the drainage. We will see patient back for reevaluation in 1  week here in the clinic. If anything worsens or changes patient will contact our office for additional recommendations. Electronic Signature(s) Signed: 02/19/2022 3:57:43 PM By: Lenda Kelp PA-C Entered By: Lenda Kelp on 02/19/2022 15:57:42 -------------------------------------------------------------------------------- SuperBill Details Patient Name: Date of Service: Colleen Score 02/19/2022 Medical Record Number: [id number] Patient Account Number: [id number] Date of Birth/Sex: Treating RN: 07/02/1931 (89 y.o. Skip Mayer Primary Care Provider: [name] Other Clinician: Betha Loa Referring Provider: Treating Provider/Extender: Trula Ore in Treatment: 6 Diagnosis Coding ICD-10 Codes Code Description [phone] Chronic venous hypertension (idiopathic) with ulcer and inflammation of right lower  extremity G60.3 Idiopathic progressive neuropathy L97.812 Non-pressure chronic ulcer of other part of right lower leg with fat layer exposed N18.30 Chronic kidney disease, stage 3 unspecified I10 Essential (primary) hypertension Facility Procedures : [facility] CPT4 Code: [id number] R, Cleatus Fox ([id number] Description: 11042 - DEB SUBQ TISSUE 20 SQ CM/< ICD-10 Diagnosis Description 58) [phone] L97.812 Non-pressure chronic ulcer of other part of right lower leg with fat layer exposed Modifier: [id number] Pa Quantity: 1 ge 9 of 9 Physician Procedures : CPT4 Code Description Modifier [id number] 11042 - WC PHYS SUBQ TISS 20 SQ CM ICD-10 Diagnosis Description L97.812 Non-pressure chronic ulcer of other part of right lower leg with fat layer exposed Quantity: 1 Electronic Signature(s) Signed: 02/19/2022 3:57:52 PM By: Lenda Kelp PA-C Entered By: Lenda Kelp on 02/19/2022 15:57:52

## 2022-02-20 ENCOUNTER — Other Ambulatory Visit: Payer: Self-pay | Admitting: Family Medicine

## 2022-02-20 DIAGNOSIS — F5101 Primary insomnia: Secondary | ICD-10-CM

## 2022-02-20 NOTE — Telephone Encounter (Signed)
Requested medication (s) are due for refill today: yes  Requested medication (s) are on the active medication list: yes  Last refill:  01/21/22  Future visit scheduled:yes  Notes to clinic:  Unable to refill per protocol, cannot delegate.      Requested Prescriptions  Pending Prescriptions Disp Refills   ALPRAZolam (XANAX) 0.5 MG tablet [Pharmacy Med Name: ALPRAZolam 0.5 MG Oral Tablet] 30 tablet 0    Sig: TAKE 1 TABLET BY MOUTH THREE TIMES DAILY AS NEEDED FOR ANXIETY     Not Delegated - Psychiatry: Anxiolytics/Hypnotics 2 Failed - 02/20/2022  4:20 PM      Failed - This refill cannot be delegated      Failed - Urine Drug Screen completed in last 360 days      Failed - Valid encounter within last 6 months    Recent Outpatient Visits           9 months ago PMR (polymyalgia rheumatica) ([place])   [place] Pickard, Cammie Mcgee, MD   1 year ago PMR (polymyalgia rheumatica) ([place])   [place] Pickard, Cammie Mcgee, MD   1 year ago PMR (polymyalgia rheumatica) ([place])   [place] Susy Frizzle, MD   2 years ago Stage 3b chronic kidney disease ([place])   [facility] Susy Frizzle, MD   2 years ago Bilateral leg weakness   [place] Pickard, Cammie Mcgee, MD              Passed - Patient is not pregnant

## 2022-02-22 NOTE — Progress Notes (Signed)
TINIE, Fox ([id number]) [id number] Page 1 of 11 Visit Report for 02/19/2022 Arrival Information Details Patient Name: Date of Service: Colleen, Fox 02/19/2022 2:45 PM Medical Record Number: [id number] Patient Account Number: [id number] Date of Birth/Sex: Treating RN: 1931/05/14 (89 y.o. [name] Primary Care [name]: [name] Other Clinician: Betha Fox Referring [name]: Treating [name]/Extender: [name] in Treatment: 6 Visit Information History Since Last Visit All ordered tests and consults were completed: No Patient Arrived: [name] Added or deleted any medications: No Arrival Time: 15:19 Any new allergies or adverse reactions: No Transfer Assistance: None Had a fall or experienced change in No Patient Identification Verified: Yes activities of daily living that may affect Secondary Verification Process Completed: Yes risk of falls: Patient Requires Transmission-Based Precautions: No Signs or symptoms of abuse/neglect since last visito No Patient Has Alerts: No Hospitalized since last visit: No Implantable device outside of the clinic excluding No cellular tissue based products placed in the center since last visit: Has Dressing in Place as Prescribed: Yes Has Compression in Place as Prescribed: Yes Pain Present Now: Yes Electronic Signature(s) Signed: 02/22/2022 9:50:53 AM By: [name] Entered By: [name] on 02/19/2022 15:20:29 -------------------------------------------------------------------------------- Clinic Level of Care Assessment Details Patient Name: Date of Service: [name] 02/19/2022 2:45 PM Medical Record Number: [id number] Patient Account Number: [id number] Date of Birth/Sex: Treating RN: 1931/06/11 (89 y.o. [name] Primary Care [name] Other Clinician: Betha Fox Referring Massiah Longanecker: Treating [name]/Extender: [name] in Treatment: 6 Clinic Level of Care Assessment Items TOOL 1 Quantity Fox []  - 0 Use when EandM and Procedure is performed on INITIAL visit ASSESSMENTS - Nursing Assessment / Reassessment []  - 0 General Physical Exam (combine w/ comprehensive assessment (listed just below) when performed on new pt. evalsADYSON, Fox ( ) [phone].pdf Page 2 of 11 []  - 0 Comprehensive Assessment (HX, ROS, Risk Assessments, Wounds Hx, etc.) ASSESSMENTS - Wound and Skin Assessment / Reassessment []  - 0 Dermatologic / Skin Assessment (not related to wound area) ASSESSMENTS - Ostomy and/or Continence Assessment and Care []  - 0 Incontinence Assessment and Management []  - 0 Ostomy Care Assessment and Management (repouching, etc.) PROCESS - Coordination of Care []  - 0 Simple Patient / Family Education for ongoing care []  - 0 Complex (extensive) Patient / Family Education for ongoing care []  - 0 Staff obtains [id number], Records, T Results / Process Orders est []  - 0 Staff telephones HHA, Nursing Homes / Clarify orders / etc []  - 0 Routine Transfer to another Facility (non-emergent condition) []  - 0 Routine Hospital Admission (non-emergent condition) []  - 0 New Admissions / [id number] / Ordering NPWT Apligraf, etc. , []  - 0 Emergency Hospital Admission (emergent condition) PROCESS - Special Needs []  - 0 Pediatric / Minor Patient Management []  - 0 Isolation Patient Management []  - 0 Hearing / Language / Visual special needs []  - 0 Assessment of Community assistance (transportation, D/C planning, etc.) []  - 0 Additional assistance / Altered mentation []  - 0 Support Surface(s) Assessment (bed, cushion, seat, etc.) INTERVENTIONS - Miscellaneous []  - 0 External ear exam []  - 0 Patient Transfer (multiple staff / / Similar devices) []  - 0 Simple Staple / Suture removal (25 or less) []  - 0 Complex  Staple / Suture removal (26 or more) []  - 0 Hypo/Hyperglycemic Management (do not check if billed separately) []  - 0 Ankle / Brachial Index (ABI) - do not check if billed  separately Has the patient been seen at the hospital within the last three years: Yes Total Fox: 0 Level Of Care: ____ Electronic Signature(s) Signed: 02/22/2022 9:50:53 AM By: [name] Entered By: [name] on 02/19/2022 15:56:01 -------------------------------------------------------------------------------- Compression Therapy Details Patient Name: Date of Service: Colleen, Fox 02/19/2022 2:45 PM Medical Record Number: [id number] Patient Account Number: [id number] Date of Birth/Sex: Treating RN: 10/27/1931 (89 y.o. [name] Primary Care [name] Other Clinician: Dakoda, Fox ([id number]) [id number] Page 3 of 11 Referring Lynnell Fiumara: Treating [name]/Extender: Barbette Merino in Treatment: 6 Compression Therapy Performed for Wound Assessment: Wound #1 Right,Lateral Lower Leg Performed By: [name], Compression Type: Three Layer Pre Treatment ABI: 0.9 Post Procedure Diagnosis Same as Pre-procedure Electronic Signature(s) Signed: 02/22/2022 9:50:53 AM By: [name] Entered By: [name] on 02/19/2022 15:55:24 -------------------------------------------------------------------------------- Compression Therapy Details Patient Name: Date of Service: Colleen, Fox 02/19/2022 2:45 PM Medical Record Number: [id number] Patient Account Number: [id number] Date of Birth/Sex: Treating RN: 04/03/1931 (89 y.o. [name] Primary Care Paris Hohn: [name] Other Clinician: Massie Fox Referring Ursula Dermody: Treating Orean Giarratano/Extender: Veronia Beets Weeks in Treatment: 6 Compression Therapy Performed for Wound Assessment: Wound #3 Left,Lateral Lower Leg Performed By:  [name], Compression Type: Three Layer Post Procedure Diagnosis Same as Pre-procedure Electronic Signature(s) Signed: 02/22/2022 9:50:53 AM By: [name] Entered By: [name] on 02/19/2022 15:55:42 -------------------------------------------------------------------------------- Encounter Discharge Information Details Patient Name: Date of Service: [name] 02/19/2022 2:45 PM Medical Record Number: [id number] Patient Account Number: [id number] Date of Birth/Sex: Treating RN: 1931-03-27 (89 y.o. [name] Primary Care Liller Yohn: [name] Other Clinician: Massie Fox Referring Marlayna Bannister: Treating Arielis Leonhart/Extender: Barbette Merino in Treatment: 6 Encounter Discharge Information Items Post Procedure Vitals Discharge Condition: Stable Temperature (F): 98.1 Ambulatory Status: Walker Pulse (bpm): [place], [place] ([id number]) [phone].pdf Page 4 of 11 Discharge Destination: Home Respiratory Rate (breaths/min): 18 Transportation: Private Auto Blood Pressure (mmHg): 143/72 Accompanied By: son Schedule Follow-up Appointment: Yes Clinical Summary of Care: Electronic Signature(s) Signed: 02/22/2022 9:50:53 AM By: [name] Entered By: [name] on 02/19/2022 16:49:30 -------------------------------------------------------------------------------- Lower Extremity Assessment Details Patient Name: Date of Service: Colleen, Fox 02/19/2022 2:45 PM Medical Record Number: [id number] Patient Account Number: [id number] Date of Birth/Sex: Treating RN: January 25, 1932 (89 y.o. [name] Primary Care Kalimah Capurro: [name] Other Clinician: Massie Fox Referring Witney Huie: Treating Jeb Schloemer/Extender: Veronia Beets Weeks in Treatment: 6 Edema Assessment Assessed: [Left: Yes] [Right: Yes] Edema: [Left: Yes] [Right: Yes] Calf Left: Right: Point of Measurement: 32 cm  From Medial Instep 34 cm 33.4 cm Ankle Left: Right: Point of Measurement: 11 cm From Medial Instep 22.4 cm 20.7 cm Vascular Assessment Pulses: Dorsalis Pedis Palpable: [Left:Yes] [Right:Yes] Electronic Signature(s) Signed: 02/20/2022 11:43:47 PM By: Gretta Cool, BSN, RN, CWS, Kim RN, BSN Signed: 02/22/2022 9:50:53 AM By: [name] Entered By: [name] on 02/19/2022 15:39:51 Multi Wound Chart Details -------------------------------------------------------------------------------- [name] ([id number]) [id number] Page 5 of 11 Patient Name: Date of Service: Colleen, Fox 02/19/2022 2:45 PM Medical Record Number: [id number] Patient Account Number: [id number] Date of Birth/Sex: Treating RN: 09-07-1931 (89 y.o. [name] Primary Care Oleg Oleson: [name] Other Clinician: Massie Fox Referring Halil Rentz: Treating Janet Decesare/Extender: Barbette Merino in Treatment: 6 Vital Signs Height(in): Pulse(bpm): 86 Weight(lbs): 140 Blood Pressure(mmHg): 143/72 Body Mass Index(BMI): Temperature(F): 98.1 Respiratory Rate(breaths/min): 16 Wound Assessments Wound Number: 1 3 N/A Photos: N/A Right, Lateral  Lower Leg Left, Lateral Lower Leg N/A Wound Location: Gradually Appeared Skin T ear/Laceration N/A Wounding Event: Venous Leg Ulcer Skin T ear N/A Primary Etiology: Hypertension Hypertension N/A Comorbid History: 10/05/2021 01/21/2022 N/A Date Acquired: 6 4 N/A Weeks of Treatment: Open Open N/A Wound Status: No No N/A Wound Recurrence: 1.7x1.5x0.2 3.4x0.9x0.2 N/A Measurements L x W x D (cm) 2.003 2.403 N/A A (cm) : rea 0.401 0.481 N/A Volume (cm) : 87.20% 4.40% N/A % Reduction in A rea: 87.20% -91.60% N/A % Reduction in Volume: Full Thickness Without Exposed Full Thickness Without Exposed N/A Classification: Support Structures Support Structures Medium Medium N/A Exudate Amount: Serosanguineous  Serosanguineous N/A Exudate Type: red, brown red, brown N/A Exudate Color: Medium (34-66%) Medium (34-66%) N/A Granulation Amount: Red Pink N/A Granulation Quality: Medium (34-66%) Medium (34-66%) N/A Necrotic Amount: Fat Layer (Subcutaneous Tissue): Yes Fat Layer (Subcutaneous Tissue): Yes N/A Exposed Structures: Fascia: No Fascia: No Tendon: No Tendon: No Muscle: No Muscle: No Joint: No Joint: No Bone: No Bone: No None None N/A Epithelialization: Treatment Notes Electronic Signature(s) Signed: 02/22/2022 9:50:53 AM By: [name] Entered By: [name] on 02/19/2022 15:39:58 -------------------------------------------------------------------------------- Multi-Disciplinary Care Plan Details Patient Name: Date of Service: [name] 02/19/2022 2:45 PM [name] ([id number]) [id number] Page 6 of 11 Medical Record Number: [id number] Patient Account Number: [id number] Date of Birth/Sex: Treating RN: Nov 04, 1931 (89 y.o. Cathlean Cower, Kim Primary Care Cira Deyoe: [name] Other Clinician: Betha Fox Referring Milam Allbaugh: Treating Pinchos Topel/Extender: [name] in Treatment: 6 Active Inactive Abuse / Safety / Falls / Self Care Management Nursing Diagnoses: Potential for injury related to falls Goals: Patient will remain injury free related to falls Date Initiated: 01/07/2022 Target Resolution Date: 02/07/2022 Goal Status: Active Interventions: Assess Activities of Daily Living upon admission and as needed Assess fall risk on admission and as needed Assess: immobility, friction, shearing, incontinence upon admission and as needed Assess impairment of mobility on admission and as needed per policy Assess personal safety and home safety (as indicated) on admission and as needed Assess self care needs on admission and as needed Notes: Wound/Skin Impairment Nursing Diagnoses: Knowledge deficit  related to ulceration/compromised skin integrity Goals: Patient/caregiver will verbalize understanding of skin care regimen Date Initiated: 01/07/2022 Target Resolution Date: 02/07/2022 Goal Status: Active Ulcer/skin breakdown will have a volume reduction of 30% by week 4 Date Initiated: 01/07/2022 Target Resolution Date: 02/07/2022 Goal Status: Active Ulcer/skin breakdown will have a volume reduction of 50% by week 8 Date Initiated: 01/07/2022 Target Resolution Date: 03/10/2022 Goal Status: Active Ulcer/skin breakdown will have a volume reduction of 80% by week 12 Date Initiated: 01/07/2022 Target Resolution Date: 04/08/2022 Goal Status: Active Ulcer/skin breakdown will heal within 14 weeks Date Initiated: 01/07/2022 Target Resolution Date: 05/10/2022 Goal Status: Active Interventions: Assess patient/caregiver ability to obtain necessary supplies Assess patient/caregiver ability to perform ulcer/skin care regimen upon admission and as needed Assess ulceration(s) every visit Notes: Electronic Signature(s) Signed: 02/20/2022 11:43:47 PM By: Elliot Gurney, BSN, RN, CWS, Kim RN, BSN Signed: 02/22/2022 9:50:53 AM By: [name] Entered By: [name] on 02/19/2022 16:48:35 [name] ([id number]) [id number] Page 7 of 11 -------------------------------------------------------------------------------- Pain Assessment Details Patient Name: Date of Service: Colleen, Fox 02/19/2022 2:45 PM Medical Record Number: [id number] Patient Account Number: [id number] Date of Birth/Sex: Treating RN: 10/25/1931 (89 y.o. [name] Primary Care Karyme Mcconathy: [name] Other Clinician: Betha Fox Referring Diara Chaudhari: Treating Dorthula Bier/Extender: Wilma Flavin Weeks in Treatment: 6 Active Problems Location of Pain Severity  and Description of Pain Patient Has Paino Yes Site Locations Pain Location: Pain in Ulcers Duration of the Pain. Constant /  Intermittento Intermittent Rate the pain. Current Pain Level: 0 Worst Pain Level: 7 Character of Pain Describe the Pain: Burning, Other: stinging Pain Management and Medication Current Pain Management: Medication: Yes Cold Application: No Rest: No Massage: No Activity: No T.E.N.S.: No Heat Application: No Leg drop or elevation: No Is the Current Pain Management Adequate: Inadequate How does your wound impact your activities of daily livingo Sleep: No Bathing: No Appetite: No Relationship With Others: No Bladder Continence: No Emotions: No Bowel Continence: No Work: No Toileting: No Drive: No Dressing: No Hobbies: No Engineer, maintenance) Signed: 02/20/2022 11:43:47 PM By: Gretta Cool, BSN, RN, CWS, Kim RN, BSN Signed: 02/22/2022 9:50:53 AM By: [name] Entered By: [name] on 02/19/2022 15:25:13 [name] ([id number]) [id number] Page 8 of 11 -------------------------------------------------------------------------------- Patient/Caregiver Education Details Patient Name: Date of Service: Colleen, Fox 1/16/2024andnbsp2:45 PM Medical Record Number: [id number] Patient Account Number: [id number] Date of Birth/Gender: Treating RN: 03-May-1931 (89 y.o. [name] Primary Care Physician: [name] Other Clinician: Massie Fox Referring Physician: Treating Physician/Extender: Barbette Merino in Treatment: 6 Education Assessment Education Provided To: Patient Education Topics Provided Wound/Skin Impairment: Handouts: Other: continue wound care as directed Methods: Explain/Verbal Responses: State content correctly Electronic Signature(s) Signed: 02/22/2022 9:50:53 AM By: [name] Entered By: [name] on 02/19/2022 16:48:32 -------------------------------------------------------------------------------- Wound Assessment Details Patient Name: Date of Service: Colleen, Fox  02/19/2022 2:45 PM Medical Record Number: [id number] Patient Account Number: [id number] Date of Birth/Sex: Treating RN: 03-07-1931 (89 y.o. [name] Primary Care Kalen Ratajczak: [name] Other Clinician: Massie Fox Referring Alaiah Lundy: Treating Meagon Duskin/Extender: Veronia Beets Weeks in Treatment: 6 Wound Status Wound Number: 1 Primary Etiology: Venous Leg Ulcer Wound Location: Right, Lateral Lower Leg Wound Status: Open Wounding Event: Gradually Appeared Comorbid History: Hypertension Date Acquired: 10/05/2021 Weeks Of Treatment: 6 Clustered Wound: No Photos KAEDYN, BELARDO ([id number]) [phone].pdf Page 9 of 11 Wound Measurements Length: (cm) 1.7 Width: (cm) 1.5 Depth: (cm) 0.2 Area: (cm) 2.003 Volume: (cm) 0.401 % Reduction in Area: 87.2% % Reduction in Volume: 87.2% Epithelialization: None Wound Description Classification: Full Thickness Without Exposed Support Structures Exudate Amount: Medium Exudate Type: Serosanguineous Exudate Color: red, brown Foul Odor After Cleansing: No Slough/Fibrino Yes Wound Bed Granulation Amount: Medium (34-66%) Exposed Structure Granulation Quality: Red Fascia Exposed: No Necrotic Amount: Medium (34-66%) Fat Layer (Subcutaneous Tissue) Exposed: Yes Necrotic Quality: Adherent Slough Tendon Exposed: No Muscle Exposed: No Joint Exposed: No Bone Exposed: No Treatment Notes Wound #1 (Lower Leg) Wound Laterality: Right, Lateral Cleanser Wound Cleanser Discharge Instruction: Wash your hands with soap and water. Remove old dressing, discard into plastic bag and place into trash. Cleanse the wound with Wound Cleanser prior to applying a clean dressing using gauze sponges, not tissues or cotton balls. Do not scrub or use excessive force. Pat dry using gauze sponges, not tissue or cotton balls. Peri-Wound Care Topical Primary Dressing Prisma 4.34 (in) Discharge Instruction: Moisten  w/normal saline or sterile water; Cover wound as directed. Do not remove from wound bed. Secondary Dressing ABD Pad 5x9 (in/in) Discharge Instruction: Cover with ABD pad Secured With Compression Wrap 3-LAYER WRAP - Profore Lite LF 3 Multilayer Compression Bandaging System Discharge Instruction: Apply 3 multi-layer wrap as prescribed. Compression Stockings Add-Ons Electronic Signature(s) Signed: 02/20/2022 11:43:47 PM By: Gretta Cool, BSN, RN, CWS, Kim RN, BSN Signed: 02/22/2022 9:50:53 AM By: Clifton James,  Fox Entered By: [name] on 02/19/2022 15:37:00 [name] ([id number]) [id number] Page 10 of 11 -------------------------------------------------------------------------------- Wound Assessment Details Patient Name: Date of Service: Colleen, Fox 02/19/2022 2:45 PM Medical Record Number: [id number] Patient Account Number: [id number] Date of Birth/Sex: Treating RN: 14-Aug-1931 (89 y.o. Cathlean Cower, Kim Primary Care Dontravious Camille: [name] Other Clinician: Betha Fox Referring Kennadi Albany: Treating Justina Bertini/Extender: Wilma Flavin Weeks in Treatment: 6 Wound Status Wound Number: 3 Primary Etiology: Skin Tear Wound Location: Left, Lateral Lower Leg Wound Status: Open Wounding Event: Skin Tear/Laceration Comorbid History: Hypertension Date Acquired: 01/21/2022 Weeks Of Treatment: 4 Clustered Wound: No Photos Wound Measurements Length: (cm) 3.4 Width: (cm) 0.9 Depth: (cm) 0.2 Area: (cm) 2.403 Volume: (cm) 0.481 % Reduction in Area: 4.4% % Reduction in Volume: -91.6% Epithelialization: None Wound Description Classification: Full Thickness Without Exposed Suppor Exudate Amount: Medium Exudate Type: Serosanguineous Exudate Color: red, brown t Structures Foul Odor After Cleansing: No Slough/Fibrino Yes Wound Bed Granulation Amount: Medium (34-66%) Exposed Structure Granulation Quality: Pink Fascia Exposed: No Necrotic  Amount: Medium (34-66%) Fat Layer (Subcutaneous Tissue) Exposed: Yes Necrotic Quality: Adherent Slough Tendon Exposed: No Muscle Exposed: No Joint Exposed: No Bone Exposed: No Treatment Notes Wound #3 (Lower Leg) Wound Laterality: Left, Lateral Cleanser Peri-Wound Care Topical AUBRYANA, VITTORIO ([id number]) [id number] Page 11 of 11 Primary Dressing Prisma 4.34 (in) Discharge Instruction: Moisten w/normal saline or sterile water; Cover wound as directed. Do not remove from wound bed. Secondary Dressing Zetuvit Plus 4x8 (in/in) Secured With Compression Wrap 3-LAYER WRAP - Profore Lite LF 3 Multilayer Compression Bandaging System Discharge Instruction: Apply 3 multi-layer wrap as prescribed. Compression Stockings Add-Ons Electronic Signature(s) Signed: 02/20/2022 11:43:47 PM By: Elliot Gurney, BSN, RN, CWS, Kim RN, BSN Signed: 02/22/2022 9:50:53 AM By: [name] Entered By: [name] on 02/19/2022 15:37:41 -------------------------------------------------------------------------------- Vitals Details Patient Name: Date of Service: Colleen, Fox 02/19/2022 2:45 PM Medical Record Number: [id number] Patient Account Number: [id number] Date of Birth/Sex: Treating RN: Jan 11, 1932 (89 y.o. Cathlean Cower, Kim Primary Care Juanmanuel Marohl: [name] Other Clinician: Betha Fox Referring Lavonia Eager: Treating Rogerio Boutelle/Extender: [name] in Treatment: 6 Vital Signs Time Taken: 15:21 Temperature (F): 98.1 Weight (lbs): 140 Pulse (bpm): 77 Respiratory Rate (breaths/min): 16 Blood Pressure (mmHg): 143/72 Reference Range: 80 - 120 mg / dl Electronic Signature(s) Signed: 02/22/2022 9:50:53 AM By: [name] Entered By: [name] on 02/19/2022 15:25:09

## 2022-02-23 ENCOUNTER — Other Ambulatory Visit: Payer: Self-pay | Admitting: Family Medicine

## 2022-02-23 DIAGNOSIS — F5101 Primary insomnia: Secondary | ICD-10-CM

## 2022-02-25 ENCOUNTER — Ambulatory Visit (INDEPENDENT_AMBULATORY_CARE_PROVIDER_SITE_OTHER): Payer: 59 | Admitting: Family Medicine

## 2022-02-25 ENCOUNTER — Encounter: Payer: Self-pay | Admitting: Family Medicine

## 2022-02-25 VITALS — BP 155/76 | HR 82 | Temp 97.3°F | Ht 63.0 in | Wt 138.0 lb

## 2022-02-25 DIAGNOSIS — M5136 Other intervertebral disc degeneration, lumbar region: Secondary | ICD-10-CM

## 2022-02-25 DIAGNOSIS — N289 Disorder of kidney and ureter, unspecified: Secondary | ICD-10-CM

## 2022-02-25 DIAGNOSIS — F5101 Primary insomnia: Secondary | ICD-10-CM | POA: Diagnosis not present

## 2022-02-25 MED ORDER — PREDNISONE 10 MG PO TABS: 10.0000 mg | ORAL_TABLET | Freq: Every day | ORAL | 0 refills | Status: DC

## 2022-02-25 MED ORDER — ESCITALOPRAM OXALATE 10 MG PO TABS: 10.0000 mg | ORAL_TABLET | Freq: Every day | ORAL | 3 refills | Status: DC

## 2022-02-25 MED ORDER — ALPRAZOLAM 0.5 MG PO TABS: 0.5000 mg | ORAL_TABLET | Freq: Three times a day (TID) | ORAL | 0 refills | Status: DC | PRN

## 2022-02-25 MED ORDER — AMLODIPINE BESYLATE 5 MG PO TABS: 5.0000 mg | ORAL_TABLET | Freq: Every day | ORAL | 1 refills | Status: DC

## 2022-02-25 MED ORDER — TRAMADOL HCL 50 MG PO TABS: 50.0000 mg | ORAL_TABLET | Freq: Four times a day (QID) | ORAL | 2 refills | Status: DC | PRN

## 2022-02-25 MED ORDER — LOSARTAN POTASSIUM 50 MG PO TABS: 50.0000 mg | ORAL_TABLET | Freq: Every day | ORAL | 3 refills | Status: DC

## 2022-02-25 NOTE — Progress Notes (Signed)
Subjective:    Patient ID: [name], female    DOB: [birth year], 89 y.o.   MRN: [id number]  Patient's blood pressure has been running elevated recently.  Systolic blood pressure has been in the 150 range with a diastolic blood pressure in the 70-80 range despite taking amlodipine 5 mg daily.  She does not want to increase the dose of amlodipine because she is already dealing with leg swelling. Past Medical History:  Diagnosis Date   Cataract    CKD (chronic kidney disease) stage 3, GFR 30-59 ml/min (HCC)    Fatty liver disease, nonalcoholic    0160- admitted with encephalopathy unsure of cause, resolved sponatneously   Hypertension    Past Surgical History:  Procedure Laterality Date   FRACTURE SURGERY     right shoulder, both wrists    JOINT REPLACEMENT     HIP- bilateral   Current Outpatient Medications on File Prior to Visit  Medication Sig Dispense Refill   ALPRAZolam (XANAX) 0.5 MG tablet Take 1 tablet (0.5 mg total) by mouth 3 (three) times daily as needed. for anxiety 30 tablet 0   amLODipine (NORVASC) 5 MG tablet Take 1 tablet by mouth once daily 90 tablet 1   collagenase (SANTYL) 250 UNIT/GM ointment Apply 1 Application topically daily. 90 g 0   denosumab (PROLIA) 60 MG/ML SOSY injection Inject 60 mg into the skin every 6 (six) months.     diclofenac Sodium (VOLTAREN) 1 % GEL Apply topically 4 (four) times daily.     escitalopram (LEXAPRO) 10 MG tablet Take 1 tablet (10 mg total) by mouth daily. 90 tablet 3   predniSONE (DELTASONE) 10 MG tablet Take 1 tablet by mouth once daily with breakfast 30 tablet 0   traMADol (ULTRAM) 50 MG tablet Take 1 tablet (50 mg total) by mouth every 6 (six) hours as needed. 120 tablet 2   No current facility-administered medications on file prior to visit.     Allergies  Allergen Reactions   Pneumococcal Vaccines Other (See Comments)    Pt had localized reaction to injection of Pneumococcal 23 - Swelling and redness in upper arm  that extend to but not beyond elbow   Social History   Socioeconomic History   Marital status: Widowed    Spouse name: Not on file   Number of children: 1   Years of education: Not on file   Highest education level: Not on file  Occupational History   Not on file  Tobacco Use   Smoking status: Never   Smokeless tobacco: Never  Vaping Use   Vaping Use: Never used  Substance and Sexual Activity   Alcohol use: Not Currently   Drug use: Never   Sexual activity: Not Currently    Comment: raised tobacco, retired  Other Topics Concern   Not on file  Social History Narrative   Lives alone   Right handed    Caffeine: 1/2-1 cup coffee in AM   Social Determinants of Health   Financial Resource Strain: Low Risk  (03/16/2021)   Overall Financial Resource Strain (CARDIA)    Difficulty of Paying Living Expenses: Not very hard  Food Insecurity: No Food Insecurity (03/16/2021)   Hunger Vital Sign    Worried About Running Out of Food in the Last Year: Never true    Cricket in the Last Year: Never true  Transportation Needs: No Transportation Needs (03/16/2021)   PRAPARE - Hydrologist (Medical):  No    Lack of Transportation (Non-Medical): No  Physical Activity: Inactive (03/16/2021)   Exercise Vital Sign    Days of Exercise per Week: 0 days    Minutes of Exercise per Session: 0 min  Stress: No Stress Concern Present (03/16/2021)   [place]    Feeling of Stress : Not at all  Social Connections: Moderately Integrated (03/16/2021)   Social Connection and Isolation Panel [NHANES]    Frequency of Communication with Friends and Family: More than three times a week    Frequency of Social Gatherings with Friends and Family: More than three times a week    Attends Religious Services: More than 4 times per year    Active Member of [employer] or Organizations: Yes    Attends Archivist  Meetings: More than 4 times per year    Marital Status: Widowed  Intimate Partner Violence: Not At Risk (03/16/2021)   Humiliation, Afraid, Rape, and Kick questionnaire    Fear of Current or Ex-Partner: No    Emotionally Abused: No    Physically Abused: No    Sexually Abused: No     Review of Systems  All other systems reviewed and are negative.      Objective:   Physical Exam Vitals reviewed.  Constitutional:      General: She is not in acute distress.    Appearance: She is well-developed and normal weight. She is not ill-appearing, toxic-appearing or diaphoretic.  HENT:     Mouth/Throat:     Mouth: Mucous membranes are dry.     Pharynx: No oropharyngeal exudate or posterior oropharyngeal erythema.  Eyes:     Extraocular Movements: Extraocular movements intact.     Conjunctiva/sclera: Conjunctivae normal.  Cardiovascular:     Rate and Rhythm: Regular rhythm. Tachycardia present.     Heart sounds: Normal heart sounds. No murmur heard.    No friction rub. No gallop.  Pulmonary:     Effort: Pulmonary effort is normal. No respiratory distress.     Breath sounds: Normal breath sounds. No stridor. No wheezing or rales.  Abdominal:     General: Bowel sounds are normal. There is no distension.     Palpations: Abdomen is soft.     Tenderness: There is no abdominal tenderness. There is no guarding or rebound.  Musculoskeletal:     Right knee: No MCL or LCL tenderness.     Right lower leg: No edema.     Left lower leg: No edema.  Lymphadenopathy:     Cervical: No cervical adenopathy.  Skin:    Findings: No erythema.  Neurological:     General: No focal deficit present.     Mental Status: She is alert and oriented to person, place, and time.     Cranial Nerves: No cranial nerve deficit.     Motor: No weakness.     Coordination: Coordination normal.     Gait: Gait normal.     Deep Tendon Reflexes: Reflexes normal.  Psychiatric:        Attention and Perception: Attention  and perception normal.        Mood and Affect: Mood is not elated. Affect is not labile, blunt, flat, angry or tearful.        Speech: Speech normal.        Behavior: Behavior is not agitated, slowed, aggressive, withdrawn, hyperactive or combative.         Assessment & Plan:  Renal insufficiency - Plan: BASIC METABOLIC PANEL WITH GFR, CBC with Differential/Platelet  Primary insomnia - Plan: ALPRAZolam (XANAX) 0.5 MG tablet, DISCONTINUED: ALPRAZolam (XANAX) 0.5 MG tablet  DDD (degenerative disc disease), lumbar - Concern for worsening stenosis in setting of DDD lumbar spine, has had plain films, conservative managment by orthopedics, Prednisone, ultram, topicals - Plan: traMADol (ULTRAM) 50 MG tablet Add losartan 50 mg daily to amlodipine 5 mg daily and recheck blood pressure in 1 week.  Monitor kidney function with a CBC and a CMP.  Refill her other medication for her

## 2022-02-26 ENCOUNTER — Encounter: Payer: 59 | Admitting: Physician Assistant

## 2022-02-26 DIAGNOSIS — I87331 Chronic venous hypertension (idiopathic) with ulcer and inflammation of right lower extremity: Secondary | ICD-10-CM | POA: Diagnosis not present

## 2022-02-26 DIAGNOSIS — I129 Hypertensive chronic kidney disease with stage 1 through stage 4 chronic kidney disease, or unspecified chronic kidney disease: Secondary | ICD-10-CM | POA: Diagnosis not present

## 2022-02-26 DIAGNOSIS — I872 Venous insufficiency (chronic) (peripheral): Secondary | ICD-10-CM | POA: Diagnosis not present

## 2022-02-26 DIAGNOSIS — N183 Chronic kidney disease, stage 3 unspecified: Secondary | ICD-10-CM | POA: Diagnosis not present

## 2022-02-26 DIAGNOSIS — L97822 Non-pressure chronic ulcer of other part of left lower leg with fat layer exposed: Secondary | ICD-10-CM | POA: Diagnosis not present

## 2022-02-26 DIAGNOSIS — G603 Idiopathic progressive neuropathy: Secondary | ICD-10-CM | POA: Diagnosis not present

## 2022-02-26 DIAGNOSIS — S81812A Laceration without foreign body, left lower leg, initial encounter: Secondary | ICD-10-CM | POA: Diagnosis not present

## 2022-02-26 DIAGNOSIS — L97812 Non-pressure chronic ulcer of other part of right lower leg with fat layer exposed: Secondary | ICD-10-CM | POA: Diagnosis not present

## 2022-02-26 LAB — CBC WITH DIFFERENTIAL/PLATELET
Absolute Monocytes: 832 cells/uL (ref 200–950)
Basophils Absolute: 29 cells/uL (ref 0–200)
Basophils Relative: 0.4 %
Eosinophils Absolute: 58 cells/uL (ref 15–500)
Eosinophils Relative: 0.8 %
HCT: 37.9 % (ref 35.0–45.0)
Hemoglobin: 12.6 g/dL (ref 11.7–15.5)
Lymphs Abs: 2015 cells/uL (ref 850–3900)
MCH: 30.6 pg (ref 27.0–33.0)
MCHC: 33.2 g/dL (ref 32.0–36.0)
MCV: 92 fL (ref 80.0–100.0)
MPV: 10.6 fL (ref 7.5–12.5)
Monocytes Relative: 11.4 %
Neutro Abs: 4365 cells/uL (ref 1500–7800)
Neutrophils Relative %: 59.8 %
Platelets: 214 10*3/uL (ref 140–400)
RBC: 4.12 10*6/uL (ref 3.80–5.10)
RDW: 13 % (ref 11.0–15.0)
Total Lymphocyte: 27.6 %
WBC: 7.3 10*3/uL (ref 3.8–10.8)

## 2022-02-26 LAB — BASIC METABOLIC PANEL WITH GFR
BUN/Creatinine Ratio: 17 (calc) (ref 6–22)
BUN: 22 mg/dL (ref 7–25)
CO2: 28 mmol/L (ref 20–32)
Calcium: 8.9 mg/dL (ref 8.6–10.4)
Chloride: 104 mmol/L (ref 98–110)
Creat: 1.31 mg/dL — ABNORMAL HIGH (ref 0.60–0.95)
Glucose, Bld: 123 mg/dL — ABNORMAL HIGH (ref 65–99)
Potassium: 4.1 mmol/L (ref 3.5–5.3)
Sodium: 141 mmol/L (ref 135–146)
eGFR: 39 mL/min/{1.73_m2} — ABNORMAL LOW (ref >=60)

## 2022-02-26 NOTE — Progress Notes (Addendum)
[name], [name] ([id number]) [phone].pdf Page 1 of 9 Visit Report for 02/26/2022 Chief Complaint Document Details Patient Name: Date of Service: [name] 02/26/2022 3:45 PM Medical Record Number: [id number] Patient Account Number: [id number] Date of Birth/Sex: Treating RN: 21-Mar-1931 (89 y.o. Skip Mayer Primary Care Provider: [name] Other Clinician: Referring Provider: Treating Provider/Extender: Trula Ore in Treatment: 7 Information Obtained from: Patient Chief Complaint Right LE Ulcer Electronic Signature(s) Signed: 02/26/2022 4:21:46 PM By: Lenda Kelp PA-C Entered By: Lenda Kelp on 02/26/2022 16:21:46 -------------------------------------------------------------------------------- Debridement Details Patient Name: Date of Service: [name] 02/26/2022 3:45 PM Medical Record Number: [id number] Patient Account Number: [id number] Date of Birth/Sex: Treating RN: [birth year] (89 y.o. Skip Mayer Primary Care Provider: [name] Other Clinician: Referring Provider: Treating Provider/Extender: Wilma Flavin Weeks in Treatment: 7 Debridement Performed for Assessment: Wound #1 Right,Lateral Lower Leg Performed By: Physician Nelida Meuse., PA-C Debridement Type: Debridement Severity of Tissue Pre Debridement: Fat layer exposed Level of Consciousness (Pre-procedure): Awake and Alert Pre-procedure Verification/Time Out Yes - 16:23 Taken: Pain Control: Lidocaine T Area Debrided (L x W): otal 1.5 (cm) x 1 (cm) = 1.5 (cm) Tissue and other material debrided: Viable, Non-Viable, Skin: Epidermis, Other: dressing material Level: Skin/Epidermis Debridement Description: Selective/Open Wound Instrument: Curette Bleeding: None Response to Treatment: Procedure was tolerated well Level of Consciousness (Post- Awake and Alert procedure): Post Debridement Measurements of Total  Wound LARRISHA, BABINEAU ([id number]) [id number] Page 2 of 9 Length: (cm) 1.5 Width: (cm) 1 Depth: (cm) 0.2 Volume: (cm) 0.236 Character of Wound/Ulcer Post Debridement: Stable Severity of Tissue Post Debridement: Fat layer exposed Post Procedure Diagnosis Same as Pre-procedure Electronic Signature(s) Signed: 02/26/2022 5:22:14 PM By: Lenda Kelp PA-C Signed: 02/27/2022 5:47:34 PM By: Elliot Gurney, BSN, RN, CWS, Kim RN, BSN Entered By: Elliot Gurney, BSN, RN, CWS, Kim on 02/26/2022 16:25:45 -------------------------------------------------------------------------------- Debridement Details Patient Name: Date of Service: [name] 02/26/2022 3:45 PM Medical Record Number: [id number] Patient Account Number: [id number] Date of Birth/Sex: Treating RN: [birth year] (89 y.o. Cathlean Cower, Kim Primary Care Provider: [name] Other Clinician: Referring Provider: Treating Provider/Extender: Trula Ore in Treatment: 7 Debridement Performed for Assessment: Wound #3 Left,Lateral Lower Leg Performed By: Physician Nelida Meuse., PA-C Debridement Type: Debridement Level of Consciousness (Pre-procedure): Awake and Alert Pre-procedure Verification/Time Out Yes - 16:23 Taken: Pain Control: Lidocaine T Area Debrided (L x W): otal 3.2 (cm) x 0.7 (cm) = 2.24 (cm) Tissue and other material debrided: Viable, Non-Viable, Slough, Subcutaneous, Skin: Epidermis, Slough, Other: dressing material Level: Skin/Subcutaneous Tissue Debridement Description: Excisional Instrument: Curette Bleeding: Minimum Hemostasis Achieved: Pressure Response to Treatment: Procedure was tolerated well Level of Consciousness (Post- Awake and Alert procedure): Post Debridement Measurements of Total Wound Length: (cm) 3.2 Width: (cm) 0.7 Depth: (cm) 0.6 Volume: (cm) 1.056 Character of Wound/Ulcer Post Debridement: Stable Post Procedure Diagnosis Same as  Pre-procedure Electronic Signature(s) Signed: 02/26/2022 5:22:14 PM By: Lenda Kelp PA-C Signed: 02/27/2022 5:47:34 PM By: Elliot Gurney, BSN, RN, CWS, Kim RN, BSN Entered By: Elliot Gurney, BSN, RN, CWS, Kim on 02/26/2022 16:26:58 [name] ([id number]) [id number] Page 3 of 9 -------------------------------------------------------------------------------- HPI Details Patient Name: Date of Service: [name] 02/26/2022 3:45 PM Medical Record Number: [id number] Patient Account Number: [id number] Date of Birth/Sex: Treating RN: 1931/09/20 (89 y.o. Skip Mayer Primary Care Provider: [name] Other Clinician: Referring Provider: Treating Provider/Extender: Trula Ore in Treatment: 7 History of Present Illness HPI Description:  01-07-2022 upon evaluation today patient appears to be doing poorly in regard to the wound on the right lateral lower extremity. She tells me that this occurred after a fall that she sustained on and around Labor Day weekend. Subsequently following this fall she tells me that she had a wound that really has not improved significantly. She has been seeing her primary care provider and they have been attempting to do what they could to get this improving overall. With that being said the patient has been on Silvadene cream, Neosporin, and more recently soaking with peroxide then wet to dry gauze recommended by primary care provider. Lastly Santyl was advised but they have not been able to get that filled as of yet. Patient does have a history of neuropathy, chronic kidney disease stage III, and hypertension along with chronic venous insufficiency otherwise she is fairly healthy and has nothing else that should affect her ability to heal. 01-14-2022 upon evaluation today patient appears to be doing well currently in regard to her wound. She has been tolerating the dressing changes without complication. With that  being said she still has quite a bit of necrotic tissue buildup on the surface of the wound unfortunately. 01-21-2022 upon evaluation today patient actually is seeming to make excellent progress in regard to her wound. She is tolerating the dressing changes and the Iodoflex seems to be doing a great job at this point. Fortunately I do not see any signs of active infection at this time. 12/26; wound on the right lateral lower leg which was initially trauma is quite a bit better down to half a centimeter in both dimensions. It has no depth. We are using Iodoflex under 3 layer compression. On the left leg she has a skin tear we have been using Xeroform this also looks quite good. The patient probably has chronic venous insufficiency although she also takes prednisone 10 mg a day adding to the overall fragility of her skin 02-12-2022 upon evaluation today patient appears to be doing well currently in regard to her wound. She has been tolerating the dressing changes without complication. The right leg is looking better the left leg is not feeling quite as much as we would like to see. I think that she has some necrotic tissue that needs to be cleaned away to be honest. Fortunately I do not see any signs of active infection locally nor systemically at this time which is great news. 02-19-2021 upon evaluation today patient's wounds actually are showing signs of improvement which is great news. Fortunately I do not see any signs of active infection locally or systemically which is great news. No fevers, chills, nausea, vomiting, or diarrhea. Upon inspection patient's wound actually is showing signs of improvement although she tells me that the left leg did have more discomfort after last week although did have to perform a fairly significant debridement last week. 02-26-2022 upon evaluation today patient's wounds actually are showing signs of improvement they are not draining nearly as much but this means they  also have become a little bit more dry in general and therefore the dressing material was sticking unfortunately. I do think that the collagen is done well but I believe on the right leg this is doing so well that possibly the switch of the Xeroform could be helpful on the left leg I think the collagen is still the better way to go to be honest but we may need to do something to try to keep this from drying  out. Psychologist, prison and probation serviceslectronic Signature(s) Signed: 02/26/2022 4:52:53 PM By: Lenda KelpStone III, Karleigh Bunte PA-C Entered By: Lenda KelpStone III, Garth Diffley on 02/26/2022 16:52:53 -------------------------------------------------------------------------------- Physical Exam Details Patient Name: Date of Service: [name], Alfreda L. 02/26/2022 3:45 PM Medical Record Number: [id number] Patient Account Number: [id number] Date of Birth/Sex: Treating RN: 03/15/1931 (89 y[address]o. [address]F) Woody, [address]Kim TellerGERRINGER, Pine[place]BETTY L ([id number]) [id number] Page 4 of 9 Primary Care Provider: Lynnea FerrierPickard, Warren Other Clinician: Referring Provider: Treating Provider/Extender: Trula OreStone, Belita Warsame Pickard, Warren Weeks in Treatment: 7 Constitutional Well-nourished and well-hydrated in no acute distress. Respiratory normal breathing without difficulty. Psychiatric this patient is able to make decisions and demonstrates good insight into disease process. Alert and Oriented x 3. pleasant and cooperative. Notes Upon inspection patient's wound bed actually showed signs of good granulation epithelization at this point. Fortunately there does not appear to be any signs of active infection locally nor systemically which is great news and overall I am extremely pleased with where we stand today. I did perform debridement to clearway some of the necrotic debris she tolerated that today without complication postdebridement wound bed appears to be doing significantly better. Electronic Signature(s) Signed: 02/26/2022 4:53:20 PM By: Lenda KelpStone III, Jeffifer Rabold PA-C Entered  By: Lenda KelpStone III, Viveka Wilmeth on 02/26/2022 16:53:20 -------------------------------------------------------------------------------- Physician Orders Details Patient Name: Date of Service: [name], Mariaeduarda L. 02/26/2022 3:45 PM Medical Record Number: [id number] Patient Account Number: [id number] Date of Birth/Sex: Treating RN: 02/17/1931 (89 y.o. Cathlean CowerF) Woody, Kim Primary Care Provider: Lynnea FerrierPickard, Warren Other Clinician: Referring Provider: Treating Provider/Extender: Trula OreStone, Saira Kramme Pickard, Warren Weeks in Treatment: 7 Verbal / Phone Orders: No Diagnosis Coding ICD-10 Coding Code Description I87.331 Chronic venous hypertension (idiopathic) with ulcer and inflammation of right lower extremity G60.3 Idiopathic progressive neuropathy L97.812 Non-pressure chronic ulcer of other part of right lower leg with fat layer exposed L97.822 Non-pressure chronic ulcer of other part of left lower leg with fat layer exposed N18.30 Chronic kidney disease, stage 3 unspecified I10 Essential (primary) hypertension Follow-up Appointments Return Appointment in 1 week. Bathing/ Shower/ Hygiene May shower with wound dressing protected with water repellent cover or cast protector. No tub bath. Anesthetic (Use 'Patient Medications' Section for Anesthetic Order Entry) Lidocaine applied to wound bed Edema Control - Lymphedema / Segmental Compressive Device / Other Optional: One layer of unna paste to top of compression wrap (to act as an anchor). Elevate, Exercise Daily and A void Standing for Long Periods of Time. Elevate legs to the level of the heart and pump ankles as often as possible Elevate leg(s) parallel to the floor when sitting. Wound Treatment [name], Takeya L ([id number]) [id number] Page 5 of 9 Wound #1 - Lower Leg Wound Laterality: Right, Lateral Cleanser: Wound Cleanser (Generic) 1 x Per Week/30 Days Discharge Instructions: Wash your hands with soap and water. Remove old dressing,  discard into plastic bag and place into trash. Cleanse the wound with Wound Cleanser prior to applying a clean dressing using gauze sponges, not tissues or cotton balls. Do not scrub or use excessive force. Pat dry using gauze sponges, not tissue or cotton balls. Prim Dressing: Prisma 4.34 (in) 1 x Per Week/30 Days ary Discharge Instructions: Moisten w/normal saline or sterile water; Cover wound as directed. Do not remove from wound bed. Secondary Dressing: ABD Pad 5x9 (in/in) 1 x Per Week/30 Days Discharge Instructions: Cover with ABD pad Compression Wrap: 3-LAYER WRAP - Profore Lite LF 3 Multilayer Compression Bandaging System 1 x Per Week/30 Days Discharge Instructions: Apply 3 multi-layer wrap as prescribed. Wound #3 - Lower Leg Wound Laterality: Left,  Lateral Prim Dressing: Prisma 4.34 (in) 3 x Per Week/30 Days ary Discharge Instructions: Moisten w/normal saline or sterile water; Cover wound as directed. Do not remove from wound bed. Secondary Dressing: Zetuvit Plus 4x8 (in/in) 3 x Per Week/30 Days Compression Wrap: 3-LAYER WRAP - Profore Lite LF 3 Multilayer Compression Bandaging System 3 x Per Week/30 Days Discharge Instructions: Apply 3 multi-layer wrap as prescribed. Electronic Signature(s) Signed: 02/28/2022 12:07:37 PM By: Elliot Gurney, BSN, RN, CWS, Kim RN, BSN Signed: 03/01/2022 2:17:33 PM By: Lenda Kelp PA-C Entered By: Elliot Gurney BSN, RN, CWS, Kim on 02/28/2022 12:07:37 -------------------------------------------------------------------------------- Problem List Details Patient Name: Date of Service: GRAYCE, BUDDEN 02/26/2022 3:45 PM Medical Record Number: [id number] Patient Account Number: [id number] Date of Birth/Sex: Treating RN: 1931/06/17 (89 y.o. Cathlean Cower, Kim Primary Care Provider: [name] Other Clinician: Referring Provider: Treating Provider/Extender: Trula Ore in Treatment: 7 Active Problems ICD-10 Encounter Code Description  Active Date MDM Diagnosis I87.331 Chronic venous hypertension (idiopathic) with ulcer and inflammation of right 01/07/2022 No Yes lower extremity G60.3 Idiopathic progressive neuropathy 01/07/2022 No Yes L97.812 Non-pressure chronic ulcer of other part of right lower leg with fat layer 01/07/2022 No Yes exposed L97.822 Non-pressure chronic ulcer of other part of left lower leg with fat layer exposed1/23/2024 No Yes SHALAYNE, LEACH ([id number]) [phone].pdf Page 6 of 9 N18.30 Chronic kidney disease, stage 3 unspecified 01/07/2022 No Yes I10 Essential (primary) hypertension 01/07/2022 No Yes Inactive Problems Resolved Problems Electronic Signature(s) Signed: 02/26/2022 4:56:01 PM By: Lenda Kelp PA-C Previous Signature: 02/26/2022 4:21:43 PM Version By: Lenda Kelp PA-C Entered By: Lenda Kelp on 02/26/2022 16:56:01 -------------------------------------------------------------------------------- Progress Note Details Patient Name: Date of Service: ELVERNA, CAFFEE 02/26/2022 3:45 PM Medical Record Number: [id number] Patient Account Number: [id number] Date of Birth/Sex: Treating RN: 20-Sep-1931 (89 y.o. Skip Mayer Primary Care Provider: [name] Other Clinician: Referring Provider: Treating Provider/Extender: Trula Ore in Treatment: 7 Subjective Chief Complaint Information obtained from Patient Right LE Ulcer History of Present Illness (HPI) 01-07-2022 upon evaluation today patient appears to be doing poorly in regard to the wound on the right lateral lower extremity. She tells me that this occurred after a fall that she sustained on and around Labor Day weekend. Subsequently following this fall she tells me that she had a wound that really has not improved significantly. She has been seeing her primary care provider and they have been attempting to do what they could to get this improving overall. With that being  said the patient has been on Silvadene cream, Neosporin, and more recently soaking with peroxide then wet to dry gauze recommended by primary care provider. Lastly Santyl was advised but they have not been able to get that filled as of yet. Patient does have a history of neuropathy, chronic kidney disease stage III, and hypertension along with chronic venous insufficiency otherwise she is fairly healthy and has nothing else that should affect her ability to heal. 01-14-2022 upon evaluation today patient appears to be doing well currently in regard to her wound. She has been tolerating the dressing changes without complication. With that being said she still has quite a bit of necrotic tissue buildup on the surface of the wound unfortunately. 01-21-2022 upon evaluation today patient actually is seeming to make excellent progress in regard to her wound. She is tolerating the dressing changes and the Iodoflex seems to be doing a great job at this point. Fortunately I do not see any signs  of active infection at this time. 12/26; wound on the right lateral lower leg which was initially trauma is quite a bit better down to half a centimeter in both dimensions. It has no depth. We are using Iodoflex under 3 layer compression. On the left leg she has a skin tear we have been using Xeroform this also looks quite good. The patient probably has chronic venous insufficiency although she also takes prednisone 10 mg a day adding to the overall fragility of her skin 02-12-2022 upon evaluation today patient appears to be doing well currently in regard to her wound. She has been tolerating the dressing changes without complication. The right leg is looking better the left leg is not feeling quite as much as we would like to see. I think that she has some necrotic tissue that needs to be cleaned away to be honest. Fortunately I do not see any signs of active infection locally nor systemically at this time which is great  news. 02-19-2021 upon evaluation today patient's wounds actually are showing signs of improvement which is great news. Fortunately I do not see any signs of active infection locally or systemically which is great news. No fevers, chills, nausea, vomiting, or diarrhea. Upon inspection patient's wound actually is showing signs of improvement although she tells me that the left leg did have more discomfort after last week although did have to perform a fairly significant debridement last week. 02-26-2022 upon evaluation today patient's wounds actually are showing signs of improvement they are not draining nearly as much but this means they also have become a little bit more dry in general and therefore the dressing material was sticking unfortunately. I do think that the collagen is done well but I believe LATORA, QUARRY ([id number]) [id number].pdf Page 7 of 9 on the right leg this is doing so well that possibly the switch of the Xeroform could be helpful on the left leg I think the collagen is still the better way to go to be honest but we may need to do something to try to keep this from drying out. Objective Constitutional Well-nourished and well-hydrated in no acute distress. Vitals Time Taken: 4:01 PM, Weight: 140 lbs, Temperature: 98.2 F, Pulse: 76 bpm, Respiratory Rate: 16 breaths/min, Blood Pressure: 165/78 mmHg. Respiratory normal breathing without difficulty. Psychiatric this patient is able to make decisions and demonstrates good insight into disease process. Alert and Oriented x 3. pleasant and cooperative. General Notes: Upon inspection patient's wound bed actually showed signs of good granulation epithelization at this point. Fortunately there does not appear to be any signs of active infection locally nor systemically which is great news and overall I am extremely pleased with where we stand today. I did perform debridement to clearway some of the necrotic  debris she tolerated that today without complication postdebridement wound bed appears to be doing significantly better. Integumentary (Hair, Skin) Wound #1 status is Open. Original cause of wound was Gradually Appeared. The date acquired was: 10/05/2021. The wound has been in treatment 7 weeks. The wound is located on the Right,Lateral Lower Leg. The wound measures 1.5cm length x 1cm width x 0.2cm depth; 1.178cm^2 area and 0.236cm^3 volume. There is Fat Layer (Subcutaneous Tissue) exposed. There is a medium amount of serosanguineous drainage noted. There is medium (34-66%) red granulation within the wound bed. There is a medium (34-66%) amount of necrotic tissue within the wound bed including Adherent Slough. Wound #3 status is Open. Original cause of wound was Skin T  ear/Laceration. The date acquired was: 01/21/2022. The wound has been in treatment 5 weeks. The wound is located on the Left,Lateral Lower Leg. The wound measures 3.2cm length x 0.7cm width x 0.4cm depth; 1.759cm^2 area and 0.704cm^3 volume. There is Fat Layer (Subcutaneous Tissue) exposed. There is a medium amount of serosanguineous drainage noted. There is no granulation within the wound bed. There is a medium (34-66%) amount of necrotic tissue within the wound bed including Eschar. 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 There was a Selective/Open Wound Skin/Epidermis Debridement with a total area  of 1.5 sq cm performed by Tommie Sams., PA-C. With the following instrument(s): Curette to remove Viable and Non-Viable tissue/material. Material removed includes Skin: Epidermis and Other: dressing material after achieving pain control using Lidocaine. No specimens were taken. A time out was conducted at 16:23, prior to the start of the procedure. There was no bleeding. The procedure was tolerated well. Post Debridement Measurements: 1.5cm length x 1cm width x 0.2cm depth; 0.236cm^3 volume. Character of Wound/Ulcer Post Debridement is stable. Severity of Tissue Post Debridement is: Fat layer exposed. Post procedure Diagnosis Wound #1: Same as Pre-Procedure Wound #3 Pre-procedure diagnosis of Wound #3 is a Skin T located on the Left,Lateral Lower Leg . There was a Excisional Skin/Subcutaneous Tissue Debridement with ear a total area of 2.24 sq cm performed by Tommie Sams., PA-C. With the following instrument(s): Curette to remove Viable and Non-Viable tissue/material. Material removed includes Subcutaneous Tissue, Slough, Skin: Epidermis, and Other: dressing material after achieving pain control using Lidocaine. No specimens were taken. A time out was conducted at 16:23, prior to the start of the procedure. A Minimum amount of bleeding was controlled with Pressure. The procedure was tolerated well. Post Debridement Measurements: 3.2cm length x 0.7cm width x 0.6cm depth; 1.056cm^3 volume. Character of Wound/Ulcer Post Debridement is stable. Post procedure Diagnosis Wound #3: Same as Pre-Procedure Plan MAMMIE, MERAS ([id number]) [phone].pdf Page 8 of 9 1. I am going to suggest that we have the patient continue to monitor for any signs of infection or worsening. Based on what I am seeing currently I do believe that she would benefit from a switch to Xeroform on the right leg and on the left leg we can use the collagen moistened with hydrogel and then using  Xeroform over top and try to keep this from drying out. 2. I am also can recommend that we have the patient continue to monitor for any signs of infection or worsening if anything changes she should let me know such as increased pain. 3. I am going to suggest as well that we continue with the 3 layer compression wraps which I do feel like are helping with her edema control. We will see patient back for reevaluation in 1 week here in the clinic. If anything worsens or changes patient will contact our office for additional recommendations. Electronic Signature(s) Signed: 02/26/2022 4:56:16 PM By: [name] PA-C Previous Signature: 02/26/2022 4:55:19 PM Version By: [name] PA-C Entered By: [name]  on 02/26/2022 16:56:16 -------------------------------------------------------------------------------- SuperBill Details Patient Name: Date of Service: SOFIJA, ANTWI 02/26/2022 Medical Record Number: [id number] Patient Account Number: [id number] Date of Birth/Sex: Treating RN: 28-Dec-1931 (89 y.o. Charolette Forward, Kim Primary Care Provider: Jenna Luo Other Clinician: Referring Provider: Treating Provider/Extender: Barbette Merino in Treatment: 7 Diagnosis Coding ICD-10 Codes Code Description [phone] Chronic venous hypertension (idiopathic) with ulcer and inflammation of right lower extremity G60.3 Idiopathic progressive neuropathy L97.812 Non-pressure chronic ulcer of other part of right lower leg with fat layer exposed L97.822 Non-pressure chronic ulcer of other part of left lower leg with fat layer exposed N18.30 Chronic kidney disease, stage 3 unspecified I10 Essential (primary) hypertension Facility Procedures : CPT4 Code: [id number] Description: 38182 - DEB SUBQ TISSUE 20 SQ CM/< ICD-10 Diagnosis Description L97.822 Non-pressure chronic ulcer of other part of left lower leg with fat layer exposed Modifier: Quantity: 1 : CPT4 Code:  [id number] Description: 78938 - DEBRIDE WOUND 1ST 20 SQ CM OR < ICD-10 Diagnosis Description L97.812 Non-pressure chronic ulcer of other part of right lower leg with fat layer exposed Modifier: Quantity: 1 Physician Procedures : CPT4 Code Description Modifier [id number] 25852 - WC PHYS SUBQ TISS 20 SQ CM ICD-10 Diagnosis Description L97.822 Non-pressure chronic ulcer of other part of left lower leg with fat layer exposed Quantity: 1 : [id number] 53614 - WC PHYS DEBR WO ANESTH 20 SQ CM ICD-10 Diagnosis Description E31.540 Non-pressure chronic ulcer of other part of right lower leg with fat layer exposed ATHLEEN, FELTNER L ([id number]) [id number] Page 9 of Quantity: 1 9 Electronic Signature(s) Signed: 02/26/2022 4:56:40 PM By: [name] PA-C Entered By: [name] on 02/26/2022 16:56:40

## 2022-02-28 NOTE — Progress Notes (Addendum)
[name], [name] ([id number]) [id number].pdf Page 1 of 9 Visit Report for 02/26/2022 Arrival Information Details Patient Name: Date of Service: [name], [name] 02/26/2022 3:45 PM Medical Record Number: [id number] Patient Account Number: [id number] Date of Birth/Sex: Treating RN: 1931/05/08 (89 y.o. [name] Primary Care [name]: [name] Other Clinician: Referring [name]: Treating [name]/Extender: [name] in Treatment: 7 Visit Information History Since Last Visit Added or deleted any medications: No Patient Arrived: Walker Has Compression in Place as Prescribed: Yes Arrival Time: 16:01 Pain Present Now: No Accompanied By: self Transfer Assistance: None Patient Identification Verified: Yes Secondary Verification Process Completed: Yes Patient Requires Transmission-Based Precautions: No Patient Has Alerts: No Electronic Signature(s) Signed: 02/27/2022 5:47:34 PM By: [name], BSN, RN, CWS, Kim RN, BSN Entered By: [name], BSN, RN, CWS, [name] on 02/26/2022 16:01:49 -------------------------------------------------------------------------------- Encounter Discharge Information Details Patient Name: Date of Service: [name], [name] 02/26/2022 3:45 PM Medical Record Number: [id number] Patient Account Number: [id number] Date of Birth/Sex: Treating RN: 03-Dec-1931 (89 y.o. [name] Primary Care Donis Pinder: [name] Other Clinician: Referring [name]: Treating [name]/Extender: [name] in Treatment: 7 Encounter Discharge Information Items Post Procedure Vitals Discharge Condition: Stable Unable to obtain vitals Reason: lack of time Ambulatory Status: Ambulatory Discharge Destination: Home Transportation: Private Auto Schedule Follow-up Appointment: Yes Clinical Summary of Care: Electronic Signature(s) Signed: 02/28/2022 12:09:22 PM By: [name], BSN, RN, CWS, Kim RN, BSN Entered By:  [name], BSN, RN, CWS, [name] on 02/28/2022 12:09:22 [name] ([id number]) [phone].pdf Page 2 of 9 -------------------------------------------------------------------------------- Lower Extremity Assessment Details Patient Name: Date of Service: [name], [name] 02/26/2022 3:45 PM Medical Record Number: [id number] Patient Account Number: [id number] Date of Birth/Sex: Treating RN: [birth year] (89 y.o. [name] Primary Care Travoris Bushey: [name] Other Clinician: Referring [name]: Treating [name]/Extender: [name] in Treatment: 7 Edema Assessment Assessed: [Left: Yes] [Right: Yes] Edema: [Left: Yes] [Right: Yes] Calf Left: Right: Point of Measurement: 32 cm From Medial Instep 34 cm 32 cm Ankle Left: Right: Point of Measurement: 11 cm From Medial Instep 22 cm 20.5 cm Vascular Assessment Pulses: Dorsalis Pedis Palpable: [Left:Yes] [Right:Yes] Blood Pressure: Brachial: [Left:165] Dorsalis Pedis: 190 Ankle: Posterior Tibial: 194 Ankle Brachial Index: [Left:1.18] Electronic Signature(s) Signed: 02/27/2022 5:47:34 PM By: [name], BSN, RN, CWS, Kim RN, BSN Entered By: [name], BSN, RN, CWS, [name] on 02/26/2022 16:32:59 -------------------------------------------------------------------------------- Multi Wound Chart Details Patient Name: Date of Service: [name] 02/26/2022 3:45 PM Medical Record Number: [id number] Patient Account Number: [id number] Date of Birth/Sex: Treating RN: [birth year] (89 y.o. [name] Primary Care [name]: [name] Other Clinician: Referring [name]: Treating [name]/Extender: [name] in Treatment: [address] ([id number]) [id number] Page 3 of 9 Vital Signs Height(in): Pulse(bpm): 76 Weight(lbs): 140 Blood Pressure(mmHg): 165/78 Body Mass Index(BMI): Temperature(F): 98.2 Respiratory Rate(breaths/min):  16 [1:Photos:] [N/A:N/A] Right, Lateral Lower Leg Left, Lateral Lower Leg N/A Wound Location: Gradually Appeared Skin T ear/Laceration N/A Wounding Event: Venous Leg Ulcer Skin T ear N/A Primary Etiology: Hypertension Hypertension N/A Comorbid History: 10/05/2021 01/21/2022 N/A Date Acquired: 7 5 N/A Fox of Treatment: Open Open N/A Wound Status: No No N/A Wound Recurrence: 1.5x1x0.2 3.2x0.7x0.4 N/A Measurements L x W x D (cm) 1.178 1.759 N/A A (cm) : rea 0.236 0.704 N/A Volume (cm) : 92.50% 30.00% N/A % Reduction in A rea: 92.50% -180.50% N/A % Reduction in Volume: Full Thickness Without Exposed Full Thickness Without Exposed N/A Classification: Support Structures Support Structures Medium Medium  N/A Exudate A mount: Serosanguineous Serosanguineous N/A Exudate Type: red, brown red, brown N/A Exudate Color: Medium (34-66%) None Present (0%) N/A Granulation A mount: Red N/A N/A Granulation Quality: Medium (34-66%) Medium (34-66%) N/A Necrotic A mount: Adherent Slough Eschar N/A Necrotic Tissue: Fat Layer (Subcutaneous Tissue): Yes Fat Layer (Subcutaneous Tissue): Yes N/A Exposed Structures: Fascia: No Fascia: No Tendon: No Tendon: No Muscle: No Muscle: No Joint: No Joint: No Bone: No Bone: No None None N/A Epithelialization: Debridement - Selective/Open Wound Debridement - Excisional N/A Debridement: Pre-procedure Verification/Time Out 16:23 16:23 N/A Taken: Lidocaine Lidocaine N/A Pain Control: Other Other, Subcutaneous, Slough N/A Tissue Debrided: Skin/Epidermis Skin/Subcutaneous Tissue N/A Level: 1.5 2.24 N/A Debridement A (sq cm): rea Curette Curette N/A Instrument: None Minimum N/A Bleeding: N/A Pressure N/A Hemostasis A chieved: Procedure was tolerated well Procedure was tolerated well N/A Debridement Treatment Response: 1.5x1x0.2 3.2x0.7x0.6 N/A Post Debridement Measurements L x W x D (cm) 0.236 1.056 N/A Post Debridement  Volume: (cm) Debridement Debridement N/A Procedures Performed: Treatment Notes Electronic Signature(s) Signed: 02/27/2022 5:47:34 PM By: Gretta Cool, BSN, RN, CWS, Kim RN, BSN Entered By: Gretta Cool, BSN, RN, CWS, [name] on 02/26/2022 16:48:16 [name] ([id number]) [phone].pdf Page 4 of 9 -------------------------------------------------------------------------------- Multi-Disciplinary Care Plan Details Patient Name: Date of Service: [name], [name] 02/26/2022 3:45 PM Medical Record Number: [id number] Patient Account Number: [id number] Date of Birth/Sex: Treating RN: 1931/12/04 (89 y.o. Marlowe Shores Primary Care Shazia Mitchener: Jenna Luo Other Clinician: Referring Adellyn Capek: Treating Nicosha Struve/Extender: Barbette Merino in Treatment: 7 Active Inactive Abuse / Safety / Falls / Self Care Management Nursing Diagnoses: Potential for injury related to falls Goals: Patient will remain injury free related to falls Date Initiated: 01/07/2022 Target Resolution Date: 03/05/2022 Goal Status: Active Interventions: Assess Activities of Daily Living upon admission and as needed Assess fall risk on admission and as needed Assess: immobility, friction, shearing, incontinence upon admission and as needed Assess impairment of mobility on admission and as needed per policy Assess personal safety and home safety (as indicated) on admission and as needed Assess self care needs on admission and as needed Notes: Wound/Skin Impairment Nursing Diagnoses: Knowledge deficit related to ulceration/compromised skin integrity Goals: Patient/caregiver will verbalize understanding of skin care regimen Date Initiated: 01/07/2022 Target Resolution Date: 03/05/2022 Goal Status: Active Ulcer/skin breakdown will have a volume reduction of 30% by week 4 Date Initiated: 01/07/2022 Date Inactivated: 02/28/2022 Target Resolution Date: 02/07/2022 Goal Status: Met Ulcer/skin  breakdown will have a volume reduction of 50% by week 8 Date Initiated: 01/07/2022 Target Resolution Date: 03/10/2022 Goal Status: Active Ulcer/skin breakdown will have a volume reduction of 80% by week 12 Date Initiated: 01/07/2022 Target Resolution Date: 04/08/2022 Goal Status: Active Ulcer/skin breakdown will heal within 14 Fox Date Initiated: 01/07/2022 Target Resolution Date: 05/10/2022 Goal Status: Active Interventions: Assess patient/caregiver ability to obtain necessary supplies Assess patient/caregiver ability to perform ulcer/skin care regimen upon admission and as needed Assess ulceration(s) every visit Notes: Electronic Signature(s) Signed: 02/28/2022 12:08:41 PM By: Gretta Cool, BSN, RN, CWS, Kim RN, BSN [place], [place] L [phone] By: Gretta Cool, BSN, RN, CWS, Kim RN, BSN [phone].pdf Page 5 of 9 Signed: 02/28/2022 12:08:41 Entered By: Gretta Cool, BSN, RN, CWS, [name] on 02/28/2022 12:08:41 -------------------------------------------------------------------------------- Pain Assessment Details Patient Name: Date of Service: [name], [name] 02/26/2022 3:45 PM Medical Record Number: [id number] Patient Account Number: [id number] Date of Birth/Sex: Treating RN: 03/31/1931 (89 y.o. Marlowe Shores Primary Care Altagracia Rone: Jenna Luo Other Clinician: Referring Mechel Schutter: Treating Celinda Dethlefs/Extender: Barbette Merino  in Treatment: 7 Active Problems Location of Pain Severity and Description of Pain Patient Has Paino No Site Locations Pain Management and Medication Current Pain Management: Electronic Signature(s) Signed: 02/27/2022 5:47:34 PM By: Gretta Cool, BSN, RN, CWS, Kim RN, BSN Entered By: Gretta Cool, BSN, RN, CWS, [name] on 02/26/2022 16:02:23 -------------------------------------------------------------------------------- Patient/Caregiver Education Details Patient Name: Date of Service: [name] 1/23/2024andnbsp3:45 PM Medical Record  Number: [id number] Patient Account Number: [id number] Date of Birth/Gender: Treating RN: 07/05/1931 (89 y.o. [address], [address] [place], [place] ([id number]) [phone].pdf Page 6 of 9 Primary Care Physician: Jenna Luo Other Clinician: Referring Physician: Treating Physician/Extender: Barbette Merino in Treatment: 7 Education Assessment Education Provided To: Patient Education Topics Provided Venous: Handouts: Controlling Swelling with Multilayered Compression Wraps Methods: Demonstration, Explain/Verbal Responses: State content correctly Wound Debridement: Handouts: Wound Debridement Methods: Explain/Verbal Responses: State content correctly Electronic Signature(s) Signed: 02/28/2022 3:12:14 PM By: Gretta Cool, BSN, RN, CWS, Kim RN, BSN Entered By: Gretta Cool, BSN, RN, CWS, [name] on 02/28/2022 12:08:12 -------------------------------------------------------------------------------- Wound Assessment Details Patient Name: Date of Service: [name], [name] 02/26/2022 3:45 PM Medical Record Number: [id number] Patient Account Number: [id number] Date of Birth/Sex: Treating RN: Mar 12, 1931 (89 y.o. Marlowe Shores Primary Care Henryk Ursin: Jenna Luo Other Clinician: Referring Denee Boeder: Treating Casidee Jann/Extender: Veronia Beets Fox in Treatment: 7 Wound Status Wound Number: 1 Primary Etiology: Venous Leg Ulcer Wound Location: Right, Lateral Lower Leg Wound Status: Open Wounding Event: Gradually Appeared Comorbid History: Hypertension Date Acquired: 10/05/2021 Fox Of Treatment: 7 Clustered Wound: No Photos Wound Measurements Length: (cm) 1. [place], [place] ([id number]) Width: (cm) 1 Depth: (cm) 0. Area: (cm) 1 Volume: (cm) 0 5 % Reduction in Area: 92.5% [id number] Page 7 of 9 % Reduction in Volume: 92.5% 2 Epithelialization: None .178 .236 Wound Description Classification: Full Thickness  Without Exposed Support Structures Exudate Amount: Medium Exudate Type: Serosanguineous Exudate Color: red, brown Foul Odor After Cleansing: No Slough/Fibrino Yes Wound Bed Granulation Amount: Medium (34-66%) Exposed Structure Granulation Quality: Red Fascia Exposed: No Necrotic Amount: Medium (34-66%) Fat Layer (Subcutaneous Tissue) Exposed: Yes Necrotic Quality: Adherent Slough Tendon Exposed: No Muscle Exposed: No Joint Exposed: No Bone Exposed: No Treatment Notes Wound #1 (Lower Leg) Wound Laterality: Right, Lateral Cleanser Wound Cleanser Discharge Instruction: Wash your hands with soap and water. Remove old dressing, discard into plastic bag and place into trash. Cleanse the wound with Wound Cleanser prior to applying a clean dressing using gauze sponges, not tissues or cotton balls. Do not scrub or use excessive force. Pat dry using gauze sponges, not tissue or cotton balls. Peri-Wound Care Topical Primary Dressing Prisma 4.34 (in) Discharge Instruction: Moisten w/normal saline or sterile water; Cover wound as directed. Do not remove from wound bed. Secondary Dressing ABD Pad 5x9 (in/in) Discharge Instruction: Cover with ABD pad Secured With Compression Wrap 3-LAYER WRAP - Profore Lite LF 3 Multilayer Compression Bandaging System Discharge Instruction: Apply 3 multi-layer wrap as prescribed. Compression Stockings Environmental education officer) Signed: 02/27/2022 5:47:34 PM By: Gretta Cool, BSN, RN, CWS, Kim RN, BSN Entered By: Gretta Cool, BSN, RN, CWS, [name] on 02/26/2022 16:18:11 -------------------------------------------------------------------------------- Wound Assessment Details Patient Name: Date of Service: [name], [name] 02/26/2022 3:45 PM Medical Record Number: [id number] Patient Account Number: [id number] Date of Birth/Sex: Treating RN: 08/22/1931 (89 y.o. Marlowe Shores Primary Care Arionne Iams: Jenna Luo Other Clinician: [name] ([id number])  [id number] Page 8 of 9 Referring Melaney Tellefsen: Treating Anselma Herbel/Extender: Barbette Merino in Treatment: 7 Wound Status Wound Number: 3 Primary Etiology: Skin Tear  Wound Location: Left, Lateral Lower Leg Wound Status: Open Wounding Event: Skin Tear/Laceration Comorbid History: Hypertension Date Acquired: 01/21/2022 Fox Of Treatment: 5 Clustered Wound: No Photos Wound Measurements Length: (cm) 3.2 Width: (cm) 0.7 Depth: (cm) 0.4 Area: (cm) 1.759 Volume: (cm) 0.704 % Reduction in Area: 30% % Reduction in Volume: -180.5% Epithelialization: None Wound Description Classification: Full Thickness Without Exposed Support Structures Exudate Amount: Medium Exudate Type: Serosanguineous Exudate Color: red, brown Foul Odor After Cleansing: No Slough/Fibrino Yes Wound Bed Granulation Amount: None Present (0%) Exposed Structure Necrotic Amount: Medium (34-66%) Fascia Exposed: No Necrotic Quality: Eschar Fat Layer (Subcutaneous Tissue) Exposed: Yes Tendon Exposed: No Muscle Exposed: No Joint Exposed: No Bone Exposed: No Treatment Notes Wound #3 (Lower Leg) Wound Laterality: Left, Lateral Cleanser Peri-Wound Care Topical Primary Dressing Prisma 4.34 (in) Discharge Instruction: Moisten w/normal saline or sterile water; Cover wound as directed. Do not remove from wound bed. Secondary Dressing Zetuvit Plus 4x8 (in/in) Secured With Compression Wrap 3-LAYER WRAP - Profore Lite LF 3 Multilayer Compression Bandaging System Discharge Instruction: Apply 3 multi-layer wrap as prescribed. Compression Stockings Add-Ons Electronic Signature(s) ALURA, OLVEDA ([id number]) [id number].pdf Page 9 of 9 Signed: 02/27/2022 5:47:34 PM By: [name], BSN, RN, CWS, Kim RN, BSN Entered By: [name], BSN, RN, CWS, [name] on 02/26/2022 16:18:36 -------------------------------------------------------------------------------- Vitals  Details Patient Name: Date of Service: AVALIE, OCONNOR 02/26/2022 3:45 PM Medical Record Number: [id number] Patient Account Number: [id number] Date of Birth/Sex: Treating RN: 07/03/1931 (89 y.o. Cathlean Cower, [name] Primary Care Nieko Clarin: [name] Other Clinician: Referring Dawsyn Zurn: Treating Tyrik Stetzer/Extender: [name] in Treatment: 7 Vital Signs Time Taken: 16:01 Temperature (F): 98.2 Weight (lbs): 140 Pulse (bpm): 76 Respiratory Rate (breaths/min): 16 Blood Pressure (mmHg): 165/78 Reference Range: 80 - 120 mg / dl Electronic Signature(s) Signed: 02/27/2022 5:47:34 PM By: [name], BSN, RN, CWS, Kim RN, BSN Entered By: [name], BSN, RN, CWS, [name] on 02/26/2022 16:02:18

## 2022-03-05 ENCOUNTER — Encounter: Payer: 59 | Admitting: Physician Assistant

## 2022-03-05 DIAGNOSIS — N183 Chronic kidney disease, stage 3 unspecified: Secondary | ICD-10-CM | POA: Diagnosis not present

## 2022-03-05 DIAGNOSIS — I87331 Chronic venous hypertension (idiopathic) with ulcer and inflammation of right lower extremity: Secondary | ICD-10-CM | POA: Diagnosis not present

## 2022-03-05 DIAGNOSIS — L97822 Non-pressure chronic ulcer of other part of left lower leg with fat layer exposed: Secondary | ICD-10-CM | POA: Diagnosis not present

## 2022-03-05 DIAGNOSIS — I129 Hypertensive chronic kidney disease with stage 1 through stage 4 chronic kidney disease, or unspecified chronic kidney disease: Secondary | ICD-10-CM | POA: Diagnosis not present

## 2022-03-05 DIAGNOSIS — G603 Idiopathic progressive neuropathy: Secondary | ICD-10-CM | POA: Diagnosis not present

## 2022-03-05 DIAGNOSIS — L97812 Non-pressure chronic ulcer of other part of right lower leg with fat layer exposed: Secondary | ICD-10-CM | POA: Diagnosis not present

## 2022-03-05 DIAGNOSIS — S81812A Laceration without foreign body, left lower leg, initial encounter: Secondary | ICD-10-CM | POA: Diagnosis not present

## 2022-03-05 NOTE — Progress Notes (Signed)
[name], [name] ([id number]) [id number] Page 1 of 8 Visit Report for 03/05/2022 Chief Complaint Document Details Patient Name: Date of Service: [name], [name] 03/05/2022 2:45 PM Medical Record Number: [id number] Patient Account Number: [id number] Date of Birth/Sex: Treating RN: December 23, 1931 (89 y.o. Skip Mayer Primary Care Provider: [name] Other Clinician: Betha Loa Referring Provider: Treating Provider/Extender: Trula Ore in Treatment: 8 Information Obtained from: Patient Chief Complaint Right LE Ulcer Electronic Signature(s) Signed: 03/05/2022 2:48:34 PM By: Lenda Kelp PA-C Entered By: Lenda Kelp on 03/05/2022 14:48:34 -------------------------------------------------------------------------------- Debridement Details Patient Name: Date of Service: [name], [name] 03/05/2022 2:45 PM Medical Record Number: [id number] Patient Account Number: [id number] Date of Birth/Sex: Treating RN: 1931-08-17 (89 y.o. Skip Mayer Primary Care Provider: [name] Other Clinician: Betha Loa Referring Provider: Treating Provider/Extender: Trula Ore in Treatment: 8 Debridement Performed for Assessment: Wound #3 Left,Lateral Lower Leg Performed By: Physician Nelida Meuse., PA-C Debridement Type: Debridement Level of Consciousness (Pre-procedure): Awake and Alert Pre-procedure Verification/Time Out Yes - 15:21 Taken: Start Time: 15:21 T Area Debrided (L x W): otal 3.2 (cm) x 0.6 (cm) = 1.92 (cm) Tissue and other material debrided: Viable, Non-Viable, Slough, Subcutaneous, Slough Level: Skin/Subcutaneous Tissue Debridement Description: Excisional Instrument: Curette Bleeding: Minimum Hemostasis Achieved: Pressure Response to Treatment: Procedure was tolerated well Level of Consciousness (Post- Awake and Alert procedure): Post Debridement Measurements of Total  Wound RANDALL, RAMPERSAD ([id number]) [id number] Page 2 of 8 Length: (cm) 3.2 Width: (cm) 0.6 Depth: (cm) 0.2 Volume: (cm) 0.302 Character of Wound/Ulcer Post Debridement: Stable Post Procedure Diagnosis Same as Pre-procedure Electronic Signature(s) Unsigned Entered By: Betha Loa on 03/05/2022 15:22:53 -------------------------------------------------------------------------------- HPI Details Patient Name: Date of Service: [name], [name] 03/05/2022 2:45 PM Medical Record Number: [id number] Patient Account Number: [id number] Date of Birth/Sex: Treating RN: 1932-01-28 (89 y.o. Skip Mayer Primary Care Provider: [name] Other Clinician: Betha Loa Referring Provider: Treating Provider/Extender: Trula Ore in Treatment: 8 History of Present Illness HPI Description: 01-07-2022 upon evaluation today patient appears to be doing poorly in regard to the wound on the right lateral lower extremity. She tells me that this occurred after a fall that she sustained on and around Labor Day weekend. Subsequently following this fall she tells me that she had a wound that really has not improved significantly. She has been seeing her primary care provider and they have been attempting to do what they could to get this improving overall. With that being said the patient has been on Silvadene cream, Neosporin, and more recently soaking with peroxide then wet to dry gauze recommended by primary care provider. Lastly Santyl was advised but they have not been able to get that filled as of yet. Patient does have a history of neuropathy, chronic kidney disease stage III, and hypertension along with chronic venous insufficiency otherwise she is fairly healthy and has nothing else that should affect her ability to heal. 01-14-2022 upon evaluation today patient appears to be doing well currently in regard to her wound. She has been  tolerating the dressing changes without complication. With that being said she still has quite a bit of necrotic tissue buildup on the surface of the wound unfortunately. 01-21-2022 upon evaluation today patient actually is seeming to make excellent progress in regard to her wound. She is tolerating the dressing changes and the Iodoflex seems to be doing a great job at this point. Fortunately I do not see any signs of  active infection at this time. 12/26; wound on the right lateral lower leg which was initially trauma is quite a bit better down to half a centimeter in both dimensions. It has no depth. We are using Iodoflex under 3 layer compression. On the left leg she has a skin tear we have been using Xeroform this also looks quite good. The patient probably has chronic venous insufficiency although she also takes prednisone 10 mg a day adding to the overall fragility of her skin 02-12-2022 upon evaluation today patient appears to be doing well currently in regard to her wound. She has been tolerating the dressing changes without complication. The right leg is looking better the left leg is not feeling quite as much as we would like to see. I think that she has some necrotic tissue that needs to be cleaned away to be honest. Fortunately I do not see any signs of active infection locally nor systemically at this time which is great news. 02-19-2021 upon evaluation today patient's wounds actually are showing signs of improvement which is great news. Fortunately I do not see any signs of active infection locally or systemically which is great news. No fevers, chills, nausea, vomiting, or diarrhea. Upon inspection patient's wound actually is showing signs of improvement although she tells me that the left leg did have more discomfort after last week although did have to perform a fairly significant debridement last week. 02-26-2022 upon evaluation today patient's wounds actually are showing signs of  improvement they are not draining nearly as much but this means they also have become a little bit more dry in general and therefore the dressing material was sticking unfortunately. I do think that the collagen is done well but I believe on the right leg this is doing so well that possibly the switch of the Xeroform could be helpful on the left leg I think the collagen is still the better way to go to be honest but we may need to do something to try to keep this from drying out. 03-05-2022 upon evaluation today patient appears to be doing well currently in regard to her wound. She has been tolerating the dressing changes without complication. Fortunately there does not appear to be any signs of infection locally nor systemically the right leg looks healed the left leg looks better. Electronic Signature(s) Signed: 03/05/2022 3:30:42 PM By: Glenna Fellows, Gilberte L ([id number]) By: Buren Kos [phone].pdf Page 3 of 8 Signed: 03/05/2022 3:30:42 PM Entered By: Lenda Kelp on 03/05/2022 15:30:42 -------------------------------------------------------------------------------- Physical Exam Details Patient Name: Date of Service: [name], [name] 03/05/2022 2:45 PM Medical Record Number: [id number] Patient Account Number: [id number] Date of Birth/Sex: Treating RN: Jun 19, 1931 (89 y.o. Skip Mayer Primary Care Provider: [name] Other Clinician: Betha Loa Referring Provider: Treating Provider/Extender: Wilma Flavin Weeks in Treatment: 8 Constitutional Well-nourished and well-hydrated in no acute distress. Respiratory normal breathing without difficulty. Psychiatric this patient is able to make decisions and demonstrates good insight into disease process. Alert and Oriented x 3. pleasant and cooperative. Notes Upon inspection patient's wound bed actually showed signs of good granulation epithelization at this point.  Fortunately I see no evidence of active infection which is great news and overall I do believe that we are headed in the right direction. The right leg is healed the left leg is better there was some need for mild sharp debridement today to clearway some of the necrotic debris patient tolerated that today without  complication and postdebridement wound bed is significantly improved. Electronic Signature(s) Signed: 03/05/2022 3:31:11 PM By: [name] PA-C Entered By: [name] on 03/05/2022 15:31:11 -------------------------------------------------------------------------------- Physician Orders Details Patient Name: Date of Service: [name], [name] 03/05/2022 2:45 PM Medical Record Number: [id number] Patient Account Number: [id number] Date of Birth/Sex: Treating RN: Nov 18, 1931 (89 y.o. Marlowe Shores Primary Care Provider: Jenna Luo Other Clinician: Massie Kluver Referring Provider: Treating Provider/Extender: Barbette Merino in Treatment: 8 Verbal / Phone Orders: No Diagnosis Coding ICD-10 Coding Code Description I87.331 Chronic venous hypertension (idiopathic) with ulcer and inflammation of right lower extremity G60.3 Idiopathic progressive neuropathy JOURDAN, MALDONADO ([id number]) [id number] Page 4 of 8 [phone] Non-pressure chronic ulcer of other part of right lower leg with fat layer exposed L97.822 Non-pressure chronic ulcer of other part of left lower leg with fat layer exposed N18.30 Chronic kidney disease, stage 3 unspecified I10 Essential (primary) hypertension Follow-up Appointments Return Appointment in 1 week. Bathing/ Shower/ Hygiene May shower with wound dressing protected with water repellent cover or cast protector. No tub bath. Anesthetic (Use 'Patient Medications' Section for Anesthetic Order Entry) Lidocaine applied to wound bed Edema Control - Lymphedema / Segmental Compressive Device /  Other Optional: One layer of unna paste to top of compression wrap (to act as an anchor). Elevate, Exercise Daily and A void Standing for Long Periods of Time. Elevate legs to the level of the heart and pump ankles as often as possible Elevate leg(s) parallel to the floor when sitting. Wound Treatment Wound #3 - Lower Leg Wound Laterality: Left, Lateral Prim Dressing: Prisma 4.34 (in) 3 x Per Week/30 Days ary Discharge Instructions: Moisten w/normal saline or sterile water; Cover wound as directed. Do not remove from wound bed. Secondary Dressing: Zetuvit Plus 4x8 (in/in) 3 x Per Week/30 Days Compression Wrap: 3-LAYER WRAP - Profore Lite LF 3 Multilayer Compression Bandaging System 3 x Per Week/30 Days Discharge Instructions: Apply 3 multi-layer wrap as prescribed. Electronic Signature(s) Unsigned Entered By: Massie Kluver on 03/05/2022 15:25:47 -------------------------------------------------------------------------------- Problem List Details Patient Name: Date of Service: [name], [name] 03/05/2022 2:45 PM Medical Record Number: [id number] Patient Account Number: [id number] Date of Birth/Sex: Treating RN: 06/23/1931 (89 y.o. Marlowe Shores Primary Care Provider: Jenna Luo Other Clinician: Massie Kluver Referring Provider: Treating Provider/Extender: Veronia Beets Weeks in Treatment: 8 Active Problems ICD-10 Encounter Code Description Active Date MDM Diagnosis I87.331 Chronic venous hypertension (idiopathic) with ulcer and inflammation of right 01/07/2022 No Yes lower extremity G60.3 Idiopathic progressive neuropathy 01/07/2022 No Yes SHARIFA, BUCHOLZ ([id number]) [id number] Page 5 of 8 [phone] Non-pressure chronic ulcer of other part of right lower leg with fat layer 01/07/2022 No Yes exposed L97.822 Non-pressure chronic ulcer of other part of left lower leg with fat layer exposed1/23/2024 No Yes N18.30 Chronic kidney  disease, stage 3 unspecified 01/07/2022 No Yes I10 Essential (primary) hypertension 01/07/2022 No Yes Inactive Problems Resolved Problems Electronic Signature(s) Signed: 03/05/2022 2:48:30 PM By: [name] PA-C Entered By: [name] on 03/05/2022 14:48:30 -------------------------------------------------------------------------------- Progress Note Details Patient Name: Date of Service: [name], [name] 03/05/2022 2:45 PM Medical Record Number: [id number] Patient Account Number: [id number] Date of Birth/Sex: Treating RN: Jul 18, 1931 (89 y.o. Marlowe Shores Primary Care Provider: Jenna Luo Other Clinician: Massie Kluver Referring Provider: Treating Provider/Extender: Barbette Merino in Treatment: 8 Subjective Chief Complaint Information obtained from Patient Right LE Ulcer History of Present Illness (HPI) 01-07-2022 upon evaluation today patient appears to be  doing poorly in regard to the wound on the right lateral lower extremity. She tells me that this occurred after a fall that she sustained on and around Labor Day weekend. Subsequently following this fall she tells me that she had a wound that really has not improved significantly. She has been seeing her primary care provider and they have been attempting to do what they could to get this improving overall. With that being said the patient has been on Silvadene cream, Neosporin, and more recently soaking with peroxide then wet to dry gauze recommended by primary care provider. Lastly Santyl was advised but they have not been able to get that filled as of yet. Patient does have a history of neuropathy, chronic kidney disease stage III, and hypertension along with chronic venous insufficiency otherwise she is fairly healthy and has nothing else that should affect her ability to heal. 01-14-2022 upon evaluation today patient appears to be doing well currently in regard to her wound. She has been  tolerating the dressing changes without complication. With that being said she still has quite a bit of necrotic tissue buildup on the surface of the wound unfortunately. 01-21-2022 upon evaluation today patient actually is seeming to make excellent progress in regard to her wound. She is tolerating the dressing changes and the Iodoflex seems to be doing a great job at this point. Fortunately I do not see any signs of active infection at this time. 12/26; wound on the right lateral lower leg which was initially trauma is quite a bit better down to half a centimeter in both dimensions. It has no depth. We are using Iodoflex under 3 layer compression. On the left leg she has a skin tear we have been using Xeroform this also looks quite good. The patient probably has chronic venous insufficiency although she also takes prednisone 10 mg a day adding to the overall fragility of her skin 02-12-2022 upon evaluation today patient appears to be doing well currently in regard to her wound. She has been tolerating the dressing changes without complication. The right leg is looking better the left leg is not feeling quite as much as we would like to see. I think that she has some necrotic tissue that needs [name], [name] ([id number]) [id number] Page 6 of 8 to be cleaned away to be honest. Fortunately I do not see any signs of active infection locally nor systemically at this time which is great news. 02-19-2021 upon evaluation today patient's wounds actually are showing signs of improvement which is great news. Fortunately I do not see any signs of active infection locally or systemically which is great news. No fevers, chills, nausea, vomiting, or diarrhea. Upon inspection patient's wound actually is showing signs of improvement although she tells me that the left leg did have more discomfort after last week although did have to perform a fairly significant debridement last  week. 02-26-2022 upon evaluation today patient's wounds actually are showing signs of improvement they are not draining nearly as much but this means they also have become a little bit more dry in general and therefore the dressing material was sticking unfortunately. I do think that the collagen is done well but I believe on the right leg this is doing so well that possibly the switch of the Xeroform could be helpful on the left leg I think the collagen is still the better way to go to be honest but we may need to do something to try to keep this from  drying out. 03-05-2022 upon evaluation today patient appears to be doing well currently in regard to her wound. She has been tolerating the dressing changes without complication. Fortunately there does not appear to be any signs of infection locally nor systemically the right leg looks healed the left leg looks better. Objective Constitutional Well-nourished and well-hydrated in no acute distress. Vitals Time Taken: 2:54 PM, Weight: 140 lbs, Temperature: 98.3 F, Pulse: 73 bpm, Respiratory Rate: 18 breaths/min, Blood Pressure: 138/74 mmHg. Respiratory normal breathing without difficulty. Psychiatric this patient is able to make decisions and demonstrates good insight into disease process. Alert and Oriented x 3. pleasant and cooperative. General Notes: Upon inspection patient's wound bed actually showed signs of good granulation epithelization at this point. Fortunately I see no evidence of active infection which is great news and overall I do believe that we are headed in the right direction. The right leg is healed the left leg is better there was some need for mild sharp debridement today to clearway some of the necrotic debris patient tolerated that today without complication and postdebridement wound bed is significantly improved. Integumentary (Hair, Skin) Wound #1 status is Healed - Epithelialized. Original cause of wound was Gradually  Appeared. The date acquired was: 10/05/2021. The wound has been in treatment 8 weeks. The wound is located on the Right,Lateral Lower Leg. The wound measures 0cm length x 0cm width x 0cm depth; 0cm^2 area and 0cm^3 volume. There is a none present amount of drainage noted. There is no granulation within the wound bed. There is no necrotic tissue within the wound bed. Wound #3 status is Open. Original cause of wound was Skin T ear/Laceration. The date acquired was: 01/21/2022. The wound has been in treatment 6 weeks. The wound is located on the Left,Lateral Lower Leg. The wound measures 3.2cm length x 0.6cm width x 0.2cm depth; 1.508cm^2 area and 0.302cm^3 volume. There is Fat Layer (Subcutaneous Tissue) exposed. There is a medium amount of serosanguineous drainage noted. There is no granulation within the wound bed. There is a medium (34-66%) amount of necrotic tissue within the wound bed including Eschar. Assessment Active Problems ICD-10 Chronic venous hypertension (idiopathic) with ulcer and inflammation of right lower extremity Idiopathic progressive neuropathy Non-pressure chronic ulcer of other part of right lower leg with fat layer exposed Non-pressure chronic ulcer of other part of left lower leg with fat layer exposed Chronic kidney disease, stage 3 unspecified Essential (primary) hypertension Procedures Wound #3 Pre-procedure diagnosis of Wound #3 is a Skin T located on the Left,Lateral Lower Leg . There was a Excisional Skin/Subcutaneous Tissue Debridement with ear a total area of 1.92 sq cm performed by Nelida Meuse., PA-C. With the following instrument(s): Curette to remove Viable and Non-Viable tissue/material. Material removed includes Subcutaneous Tissue and Slough and. A time out was conducted at 15:21, prior to the start of the procedure. A Minimum amount of bleeding was controlled with Pressure. The procedure was tolerated well. Post Debridement Measurements: 3.2cm length x  0.6cm width x 0.2cm depth; 0.302cm^3 volume. Character of Wound/Ulcer Post Debridement is stable. Post procedure Diagnosis Wound #3: Same as Pre-Procedure Pre-procedure diagnosis of Wound #3 is a Skin T located on the Left,Lateral Lower Leg . There was a Three Layer Compression Therapy Procedure with a ear pre-treatment ABI of 1.2 by Betha Loa. Post procedure Diagnosis Wound #3: Same as Pre-Procedure [name], [name] ([id number]) [id number] Page 7 of 8 There was a Three Layer Compression Therapy Procedure with a pre-treatment ABI of  0.9 by Massie Kluver. Post procedure Diagnosis Wound #: Same as Pre-Procedure Plan Follow-up Appointments: Return Appointment in 1 week. Bathing/ Shower/ Hygiene: May shower with wound dressing protected with water repellent cover or cast protector. No tub bath. Anesthetic (Use 'Patient Medications' Section for Anesthetic Order Entry): Lidocaine applied to wound bed Edema Control - Lymphedema / Segmental Compressive Device / Other: Optional: One layer of unna paste to top of compression wrap (to act as an anchor). Elevate, Exercise Daily and Avoid Standing for Long Periods of Time. Elevate legs to the level of the heart and pump ankles as often as possible Elevate leg(s) parallel to the floor when sitting. WOUND #3: - Lower Leg Wound Laterality: Left, Lateral Prim Dressing: Prisma 4.34 (in) 3 x Per Week/30 Days ary Discharge Instructions: Moisten w/normal saline or sterile water; Cover wound as directed. Do not remove from wound bed. Secondary Dressing: Zetuvit Plus 4x8 (in/in) 3 x Per Week/30 Days Com pression Wrap: 3-LAYER WRAP - Profore Lite LF 3 Multilayer Compression Bandaging System 3 x Per Week/30 Days Discharge Instructions: Apply 3 multi-layer wrap as prescribed. 1. I am going to suggest that we have the patient continue to monitor for any signs of infection or worsening. Based on what I am seeing I do believe we  are headed in the right direction. 2. I am good recommend as well that we can wrap the leg on the right 1 more week although the left leg it we will wrap and continue with the Prisma. 3. I am going to suggest she continue to elevate her legs she also has some compression socks to bring with her next week and assuming everything looks good on the right leg she will be ready to get into those at that point. We will see patient back for reevaluation in 1 week here in the clinic. If anything worsens or changes patient will contact our office for additional recommendations. Electronic Signature(s) Signed: 03/05/2022 3:31:46 PM By: [name] PA-C Entered By: [name] on 03/05/2022 15:31:45 -------------------------------------------------------------------------------- SuperBill Details Patient Name: Date of Service: [name], [name] 03/05/2022 Medical Record Number: [id number] Patient Account Number: [id number] Date of Birth/Sex: Treating RN: 06-12-1931 (89 y.o. Charolette Forward, Kim Primary Care Provider: Jenna Luo Other Clinician: Massie Kluver Referring Provider: Treating Provider/Extender: Barbette Merino in Treatment: 8 Diagnosis Coding ICD-10 Codes Code Description [phone] Chronic venous hypertension (idiopathic) with ulcer and inflammation of right lower extremity G60.3 Idiopathic progressive neuropathy L97.812 Non-pressure chronic ulcer of other part of right lower leg with fat layer exposed L97.822 Non-pressure chronic ulcer of other part of left lower leg with fat layer exposed N18.30 Chronic kidney disease, stage 3 unspecified [name], LUNGREN ([id number]) [id number] Page 8 of 8 I10 Essential (primary) hypertension Facility Procedures : CPT4 Code: [id number] Description: 54008 - DEB SUBQ TISSUE 20 SQ CM/< ICD-10 Diagnosis Description L97.822 Non-pressure chronic ulcer of other part of left lower leg with fat layer  expo Modifier: sed Quantity: 1 Physician Procedures : CPT4 Code Description Modifier [id number] 11042 - WC PHYS SUBQ TISS 20 SQ CM ICD-10 Diagnosis Description L97.822 Non-pressure chronic ulcer of other part of left lower leg with fat layer exposed Quantity: 1 Electronic Signature(s) Signed: 03/05/2022 3:32:08 PM By: [name] PA-C Entered By: [name] on 03/05/2022 15:32:08

## 2022-03-05 NOTE — Progress Notes (Signed)
[name], [name] ([id number]) [phone].pdf Page 1 of 11 Visit Report for 03/05/2022 Arrival Information Details Patient Name: Date of Service: [name], [name] 03/05/2022 2:45 PM Medical Record Number: [id number] Patient Account Number: [id number] Date of Birth/Sex: Treating RN: 30-Dec-1931 (89 y.o. Skip Mayer Primary Care Rondell Frick: [name] Other Clinician: Betha Loa Referring Rodina Pinales: Treating Rickey Sadowski/Extender: Trula Ore in Treatment: 8 Visit Information History Since Last Visit All ordered tests and consults were completed: No Patient Arrived: [name] Added or deleted any medications: No Arrival Time: 14:48 Any new allergies or adverse reactions: No Transfer Assistance: None Had a fall or experienced change in No Patient Identification Verified: Yes activities of daily living that may affect Secondary Verification Process Completed: Yes risk of falls: Patient Requires Transmission-Based Precautions: No Signs or symptoms of abuse/neglect since last visito No Patient Has Alerts: No Hospitalized since last visit: No Implantable device outside of the clinic excluding No cellular tissue based products placed in the center since last visit: Has Dressing in Place as Prescribed: Yes Has Compression in Place as Prescribed: Yes Pain Present Now: No Electronic Signature(s) Unsigned Entered By: Betha Loa on 03/05/2022 14:53:47 -------------------------------------------------------------------------------- Clinic Level of Care Assessment Details Patient Name: Date of Service: [name], [name] 03/05/2022 2:45 PM Medical Record Number: [id number] Patient Account Number: [id number] Date of Birth/Sex: Treating RN: 07-Mar-1931 (89 y.o. Skip Mayer Primary Care Hoyle Barkdull: [name] Other Clinician: Betha Loa Referring Alya Smaltz: Treating Jaydyn Bozzo/Extender: Trula Ore in Treatment:  8 Clinic Level of Care Assessment Items TOOL 1 Quantity Score []  - 0 Use when EandM and Procedure is performed on INITIAL visit ASSESSMENTS - Nursing Assessment / Reassessment SHILO, PHILIPSON ([name]) [phone].pdf Page 2 of 11 []  - 0 General Physical Exam (combine w/ comprehensive assessment (listed just below) when performed on new pt. evals) []  - 0 Comprehensive Assessment (HX, ROS, Risk Assessments, Wounds Hx, etc.) ASSESSMENTS - Wound and Skin Assessment / Reassessment []  - 0 Dermatologic / Skin Assessment (not related to wound area) ASSESSMENTS - Ostomy and/or Continence Assessment and Care []  - 0 Incontinence Assessment and Management []  - 0 Ostomy Care Assessment and Management (repouching, etc.) PROCESS - Coordination of Care []  - 0 Simple Patient / Family Education for ongoing care []  - 0 Complex (extensive) Patient / Family Education for ongoing care []  - 0 Staff obtains [id number], Records, T Results / Process Orders est []  - 0 Staff telephones HHA, Nursing Homes / Clarify orders / etc []  - 0 Routine Transfer to another Facility (non-emergent condition) []  - 0 Routine Hospital Admission (non-emergent condition) []  - 0 New Admissions / / Ordering NPWT Apligraf, etc. , []  - 0 Emergency Hospital Admission (emergent condition) PROCESS - Special Needs []  - 0 Pediatric / Minor Patient Management []  - 0 Isolation Patient Management []  - 0 Hearing / Language / Visual special needs []  - 0 Assessment of Community assistance (transportation, D/C planning, etc.) []  - 0 Additional assistance / Altered mentation []  - 0 Support Surface(s) Assessment (bed, cushion, seat, etc.) INTERVENTIONS - Miscellaneous []  - 0 External ear exam []  - 0 Patient Transfer (multiple staff / / Similar devices) []  - 0 Simple Staple / Suture removal (25 or less) []  - 0 Complex Staple / Suture removal (26 or more) []  -  0 Hypo/Hyperglycemic Management (do not check if billed separately) []  - 0 Ankle / Brachial Index (ABI) - do not check if billed separately Has the patient been seen  at the hospital within the last three years: Yes Total Score: 0 Level Of Care: ____ Electronic Signature(s) Unsigned Entered By: Massie Kluver on 03/05/2022 15:26:01 -------------------------------------------------------------------------------- Compression Therapy Details Patient Name: Date of Service: [name], [name] 03/05/2022 2:45 PM [name] ([id number]) [phone].pdf Page 3 of 11 Medical Record Number: [id number] Patient Account Number: [id number] Date of Birth/Sex: Treating RN: [birth year] (89 y.o. Marlowe Shores Primary Care Kameren Pargas: Jenna Luo Other Clinician: Massie Kluver Referring Maurisha Mongeau: Treating Dianey Suchy/Extender: Veronia Beets Weeks in Treatment: 8 Compression Therapy Performed for Wound Assessment: Wound #3 Left,Lateral Lower Leg Performed By: Lenice Pressman, Angie, Compression Type: Three Layer Pre Treatment ABI: 1.2 Post Procedure Diagnosis Same as Pre-procedure Electronic Signature(s) Unsigned Entered By: Massie Kluver on 03/05/2022 15:23:25 -------------------------------------------------------------------------------- Compression Therapy Details Patient Name: Date of Service: [name], [name] 03/05/2022 2:45 PM Medical Record Number: [id number] Patient Account Number: [id number] Date of Birth/Sex: Treating RN: Nov 20, 1931 (89 y.o. Marlowe Shores Primary Care Irineo Gaulin: Jenna Luo Other Clinician: Massie Kluver Referring Aahna Rossa: Treating Shekia Kuper/Extender: Veronia Beets Weeks in Treatment: 8 Compression Therapy Performed for Wound Assessment: NonWound Condition Lymphedema - Right Leg Performed By: Lenice Pressman, Angie, Compression Type: Three Layer Pre Treatment ABI: 0.9 Post Procedure Diagnosis Same  as Pre-procedure Electronic Signature(s) Unsigned Entered By: Massie Kluver on 03/05/2022 15:25:18 -------------------------------------------------------------------------------- Encounter Discharge Information Details Patient Name: Date of Service: [name], [name] 03/05/2022 2:45 PM Medical Record Number: [id number] Patient Account Number: [id number] Date of Birth/Sex: Treating RN: 1931-04-23 (89 y.o. Marlowe Shores Walford, Orland ([id number]) [phone].pdf Page 4 of 11 Primary Care Bentleigh Stankus: Jenna Luo Other Clinician: Massie Kluver Referring Naythan Douthit: Treating Kinya Meine/Extender: Barbette Merino in Treatment: 8 Encounter Discharge Information Items Post Procedure Vitals Discharge Condition: Stable Temperature (F): 98.3 Ambulatory Status: Walker Pulse (bpm): 73 Discharge Destination: Home Respiratory Rate (breaths/min): 18 Transportation: Other Blood Pressure (mmHg): 138/74 Accompanied By: son Schedule Follow-up Appointment: Yes Clinical Summary of Care: Electronic Signature(s) Unsigned Entered ByMassie Kluver on 03/05/2022 15:43:19 -------------------------------------------------------------------------------- Lower Extremity Assessment Details Patient Name: Date of Service: [name], [name] 03/05/2022 2:45 PM Medical Record Number: [id number] Patient Account Number: [id number] Date of Birth/Sex: Treating RN: 1931/10/21 (89 y.o. Marlowe Shores Primary Care Keiarah Orlowski: Jenna Luo Other Clinician: Massie Kluver Referring Thomes Burak: Treating Kerah Hardebeck/Extender: Veronia Beets Weeks in Treatment: 8 Edema Assessment Assessed: [Left: Yes] [Right: Yes] Edema: [Left: No] [Right: No] Calf Left: Right: Point of Measurement: 32 cm From Medial Instep 31.7 cm 33 cm Ankle Left: Right: Point of Measurement: 11 cm From Medial Instep 22 cm 21.3 cm Vascular Assessment Pulses: Dorsalis Pedis Palpable:  [Left:Yes] [Right:Yes] Electronic Signature(s) Unsigned Entered ByMassie Kluver on 03/05/2022 15:14:19 Signature(s): [name] ([id number] Date(s): [id number] Page 5 of 11 -------------------------------------------------------------------------------- Multi Wound Chart Details Patient Name: Date of Service: [name], [name] 03/05/2022 2:45 PM Medical Record Number: [id number] Patient Account Number: [id number] Date of Birth/Sex: Treating RN: 1931/05/19 (89 y.o. Marlowe Shores Primary Care Erek Kowal: Jenna Luo Other Clinician: Massie Kluver Referring Jakarri Lesko: Treating Aarohi Redditt/Extender: Barbette Merino in Treatment: 8 Vital Signs Height(in): Pulse(bpm): 49 Weight(lbs): 140 Blood Pressure(mmHg): 138/74 Body Mass Index(BMI): Temperature(F): 98.3 Respiratory Rate(breaths/min): 18 [1:Photos:] [N/A:N/A] Right, Lateral Lower Leg Left, Lateral Lower Leg N/A Wound Location: Gradually Appeared Skin T ear/Laceration N/A Wounding Event: Venous Leg Ulcer Skin T ear N/A Primary Etiology: Hypertension Hypertension N/A Comorbid History: 10/05/2021 01/21/2022 N/A Date Acquired: 8 6 N/A Weeks of Treatment: Healed - Epithelialized Open N/A Wound  Status: No No N/A Wound Recurrence: 0x0x0 3.2x0.6x0.2 N/A Measurements L x W x D (cm) 0 1.508 N/A A (cm) : rea 0 0.302 N/A Volume (cm) : 100.00% 40.00% N/A % Reduction in A rea: 100.00% -20.30% N/A % Reduction in Volume: Full Thickness Without Exposed Full Thickness Without Exposed N/A Classification: Support Structures Support Structures None Present Medium N/A Exudate A mount: N/A Serosanguineous N/A Exudate Type: N/A red, brown N/A Exudate Color: None Present (0%) None Present (0%) N/A Granulation Amount: None Present (0%) Medium (34-66%) N/A Necrotic Amount: N/A Eschar N/A Necrotic Tissue: Fascia: No Fat Layer (Subcutaneous Tissue): Yes N/A Exposed  Structures: Fat Layer (Subcutaneous Tissue): No Fascia: No Tendon: No Tendon: No Muscle: No Muscle: No Joint: No Joint: No Bone: No Bone: No Large (67-100%) None N/A Epithelialization: Treatment Notes Electronic Signature(s) Unsigned Entered By: Massie Kluver on 03/05/2022 15:21:43 Signature(s): [name] ([id number]) [id number] Date(s): [id number] Page 6 of 11 -------------------------------------------------------------------------------- Multi-Disciplinary Care Plan Details Patient Name: Date of Service: [name], [name] 03/05/2022 2:45 PM Medical Record Number: [id number] Patient Account Number: [id number] Date of Birth/Sex: Treating RN: [birth year] (89 y.o. Marlowe Shores Primary Care Aviella Disbrow: Jenna Luo Other Clinician: Massie Kluver Referring Calise Dunckel: Treating Loni Abdon/Extender: Barbette Merino in Treatment: 8 Active Inactive Abuse / Safety / Falls / Self Care Management Nursing Diagnoses: Potential for injury related to falls Goals: Patient will remain injury free related to falls Date Initiated: 01/07/2022 Target Resolution Date: 03/05/2022 Goal Status: Active Interventions: Assess Activities of Daily Living upon admission and as needed Assess fall risk on admission and as needed Assess: immobility, friction, shearing, incontinence upon admission and as needed Assess impairment of mobility on admission and as needed per policy Assess personal safety and home safety (as indicated) on admission and as needed Assess self care needs on admission and as needed Notes: Wound/Skin Impairment Nursing Diagnoses: Knowledge deficit related to ulceration/compromised skin integrity Goals: Patient/caregiver will verbalize understanding of skin care regimen Date Initiated: 01/07/2022 Target Resolution Date: 03/05/2022 Goal Status: Active Ulcer/skin breakdown will have a volume reduction of 30% by week 4 Date Initiated:  01/07/2022 Date Inactivated: 02/28/2022 Target Resolution Date: 02/07/2022 Goal Status: Met Ulcer/skin breakdown will have a volume reduction of 50% by week 8 Date Initiated: 01/07/2022 Target Resolution Date: 03/10/2022 Goal Status: Active Ulcer/skin breakdown will have a volume reduction of 80% by week 12 Date Initiated: 01/07/2022 Target Resolution Date: 04/08/2022 Goal Status: Active Ulcer/skin breakdown will heal within 14 weeks Date Initiated: 01/07/2022 Target Resolution Date: 05/10/2022 Goal Status: Active Interventions: Assess patient/caregiver ability to obtain necessary supplies Assess patient/caregiver ability to perform ulcer/skin care regimen upon admission and as needed Assess ulceration(s) every visit Notes: ZYANNA, LEISINGER ([id number]) [phone].pdf Page 7 of 11 Electronic Signature(s) Unsigned Entered By: Massie Kluver on 03/05/2022 15:26:37 -------------------------------------------------------------------------------- Pain Assessment Details Patient Name: Date of Service: [name], [name] 03/05/2022 2:45 PM Medical Record Number: [id number] Patient Account Number: [id number] Date of Birth/Sex: Treating RN: 1931/10/26 (89 y.o. Marlowe Shores Primary Care Myleah Cavendish: Jenna Luo Other Clinician: Massie Kluver Referring Kahdijah Errickson: Treating Akina Maish/Extender: Barbette Merino in Treatment: 8 Active Problems Location of Pain Severity and Description of Pain Patient Has Paino No Site Locations Pain Management and Medication Current Pain Management: Electronic Signature(s) Unsigned Entered By: Massie Kluver on 03/05/2022 15:11:09 Signature(s): LENA, GORES ([id number]) 1241 Date(s): [id number] Page 8 of 11 -------------------------------------------------------------------------------- Patient/Caregiver Education Details Patient Name: Date of Service: [name], [name] 1/30/2024andnbsp2:45  PM Medical Record Number: [id number] Patient  Account Number: [id number] Date of Birth/Gender: Treating RN: 06-11-1931 (89 y.o. Marlowe Shores Primary Care Physician: Jenna Luo Other Clinician: Massie Kluver Referring Physician: Treating Physician/Extender: Barbette Merino in Treatment: 8 Education Assessment Education Provided To: Patient Education Topics Provided Wound/Skin Impairment: Handouts: Other: continue wound care as directed Methods: Explain/Verbal Responses: State content correctly Electronic Signature(s) Unsigned Entered By: Massie Kluver on 03/05/2022 15:26:34 -------------------------------------------------------------------------------- Wound Assessment Details Patient Name: Date of Service: [name], REDWINE 03/05/2022 2:45 PM Medical Record Number: [id number] Patient Account Number: [id number] Date of Birth/Sex: Treating RN: 1931-07-26 (89 y.o. Marlowe Shores Primary Care Dayzha Pogosyan: Jenna Luo Other Clinician: Massie Kluver Referring Suzannah Bettes: Treating Kenidee Cregan/Extender: Veronia Beets Weeks in Treatment: 8 Wound Status Wound Number: 1 Primary Etiology: Venous Leg Ulcer Wound Location: Right, Lateral Lower Leg Wound Status: Healed - Epithelialized Wounding Event: Gradually Appeared Comorbid History: Hypertension Date Acquired: 10/05/2021 Weeks Of Treatment: 8 Clustered Wound: No Photos Wound Measurements SHERISE, GEERDES ([id number]) Length: (cm) 0 Width: (cm) 0 Depth: (cm) 0 Area: (cm) 0 Volume: (cm) 0 [id number].pdf Page 9 of 11 % Reduction in Area: 100% % Reduction in Volume: 100% Epithelialization: Large (67-100%) Wound Description Classification: Full Thickness Without Exposed Support Exudate Amount: None Present Structures Foul Odor After Cleansing: No Slough/Fibrino No Wound Bed Granulation Amount: None Present (0%) Exposed Structure Necrotic Amount: None Present  (0%) Fascia Exposed: No Fat Layer (Subcutaneous Tissue) Exposed: No Tendon Exposed: No Muscle Exposed: No Joint Exposed: No Bone Exposed: No Treatment Notes Wound #1 (Lower Leg) Wound Laterality: Right, Lateral Cleanser Peri-Wound Care Topical Primary Dressing Secondary Dressing Secured With Compression Wrap Compression Stockings Add-Ons Electronic Signature(s) Unsigned Entered ByMassie Kluver on 03/05/2022 15:21:19 -------------------------------------------------------------------------------- Wound Assessment Details Patient Name: Date of Service: SAMIKSHA, PELLICANO 03/05/2022 2:45 PM Medical Record Number: [id number] Patient Account Number: [id number] Date of Birth/Sex: Treating RN: Dec 05, 1931 (89 y.o. Marlowe Shores Primary Care Conard Alvira: Jenna Luo Other Clinician: Massie Kluver Referring Ilana Prezioso: Treating Edris Friedt/Extender: Veronia Beets Weeks in Treatment: 8 Wound Status Wound Number: 3 Primary Etiology: Skin Tear Wound Location: Left, Lateral Lower Leg Wound Status: Open Wounding Event: Skin Tear/Laceration Comorbid History: Hypertension Date Acquired: 01/21/2022 Weeks Of Treatment: 6 YANIL, DAWE ([id number]) [phone].pdf Page 10 of 11 Clustered Wound: No Photos Wound Measurements Length: (cm) 3.2 Width: (cm) 0.6 Depth: (cm) 0.2 Area: (cm) 1.508 Volume: (cm) 0.302 % Reduction in Area: 40% % Reduction in Volume: -20.3% Epithelialization: None Wound Description Classification: Full Thickness Without Exposed Supp Exudate Amount: Medium Exudate Type: Serosanguineous Exudate Color: red, brown ort Structures Foul Odor After Cleansing: No Slough/Fibrino Yes Wound Bed Granulation Amount: None Present (0%) Exposed Structure Necrotic Amount: Medium (34-66%) Fascia Exposed: No Necrotic Quality: Eschar Fat Layer (Subcutaneous Tissue) Exposed: Yes Tendon Exposed: No Muscle Exposed: No Joint  Exposed: No Bone Exposed: No Treatment Notes Wound #3 (Lower Leg) Wound Laterality: Left, Lateral Cleanser Peri-Wound Care Topical Primary Dressing Prisma 4.34 (in) Discharge Instruction: Moisten w/normal saline or sterile water; Cover wound as directed. Do not remove from wound bed. Secondary Dressing Zetuvit Plus 4x8 (in/in) Secured With Compression Wrap 3-LAYER WRAP - Profore Lite LF 3 Multilayer Compression Bandaging System Discharge Instruction: Apply 3 multi-layer wrap as prescribed. Compression Stockings Add-Ons Electronic Signature(s) Unsigned Entered By: Massie Kluver on 03/05/2022 15:11:50 Signature(s): VAIDEHI, BRADDY ([id number]) [id number] Date(s): 90.pdf Page 11 of 11 -------------------------------------------------------------------------------- Vitals Details Patient Name: Date of Service: RANATA, LAUGHERY 03/05/2022 2:45 PM Medical Record Number: [id number] Patient Account Number: [id number]  Date of Birth/Sex: Treating RN: 12-04-1931 (89 y.o. Marlowe Shores Primary Care Destani Wamser: Jenna Luo Other Clinician: Massie Kluver Referring Brodin Gelpi: Treating Lavana Huckeba/Extender: Barbette Merino in Treatment: 8 Vital Signs Time Taken: 14:54 Temperature (F): 98.3 Weight (lbs): 140 Pulse (bpm): 73 Respiratory Rate (breaths/min): 18 Blood Pressure (mmHg): 138/74 Reference Range: 80 - 120 mg / dl Electronic Signature(s) Unsigned Entered ByMassie Kluver on 03/05/2022 14:56:21 Signature(s): Date(s):

## 2022-03-12 ENCOUNTER — Encounter: Payer: Self-pay | Admitting: Orthopaedic Surgery

## 2022-03-12 ENCOUNTER — Ambulatory Visit (INDEPENDENT_AMBULATORY_CARE_PROVIDER_SITE_OTHER): Payer: 59 | Admitting: Orthopaedic Surgery

## 2022-03-12 VITALS — BP 156/68 | HR 88 | Ht 63.0 in | Wt 137.0 lb

## 2022-03-12 DIAGNOSIS — M1712 Unilateral primary osteoarthritis, left knee: Secondary | ICD-10-CM

## 2022-03-12 NOTE — Progress Notes (Signed)
Office Visit Note   Patient: [name]           Date of Birth: April 21, 1931           MRN: [id number] Visit Date: 03/12/2022              Requested by: Susy Frizzle, MD [address] [place],  [place] [id number] PCP: Susy Frizzle, MD   Assessment & Plan: Visit Diagnoses:  1. Unilateral primary osteoarthritis, left knee     Plan: Injections have not worked.  Arthritis is severe enough that a Visco injection is unlikely to give her any improvement.  She states she has been told she has been cleared medically by her PCP.  Follow-Up Instructions: No follow-ups on file.   Orders:  No orders of the defined types were placed in this encounter.  No orders of the defined types were placed in this encounter.     Procedures: No procedures performed   Clinical Data: No additional findings.   Subjective: Chief Complaint  Patient presents with   Left Leg - Pain    HPI 89 year old female returns with left knee primary osteoarthritis.  Previous injections only gave her temporary relief she has recurrent fusion pronounced pain and is amatory with rolling walker.  She has an wrap on her leg for venous stasis problems left leg.  Right leg is gotten better now she is just in a TED hose.  She states she has been told she is not a candidate for knee arthroplasty due to compression fractures in the thoracic spine and her age.  Review of Systems 14 point update unchanged.  Previous hip arthroplasties left requiring revision by Dr. Cherly Anderson.   Objective: Vital Signs: BP (!) 156/68   Pulse 88   Ht 5\' 3"  (1.6 m)   Wt 137 lb (62.1 kg)   BMI 24.27 kg/m   Physical Exam Constitutional:      Appearance: She is well-developed.  HENT:     Head: Normocephalic.     Right Ear: External ear normal.     Left Ear: External ear normal. There is no impacted cerumen.  Eyes:     Pupils: Pupils are equal, round, and reactive to light.  Neck:     Thyroid: No thyromegaly.      Trachea: No tracheal deviation.  Cardiovascular:     Rate and Rhythm: Normal rate.  Pulmonary:     Effort: Pulmonary effort is normal.  Abdominal:     Palpations: Abdomen is soft.  Musculoskeletal:     Cervical back: No rigidity.  Skin:    General: Skin is warm and dry.  Neurological:     Mental Status: She is alert and oriented to person, place, and time.  Psychiatric:        Behavior: Behavior normal.     Ortho Exam negative logroll hips.  Left knee has 3-4+ effusion tenderness suprapatellar pouch positive patellofemoral grind test medial lateral joint line tenderness.  She lacks 10 degrees reaching full extension and ambulates with a knee flexed gait.  Due to boot on lower extremity pulses not palpable.  Specialty Comments:  No specialty comments available.  Imaging: No results found.   PMFS History: Patient Active Problem List   Diagnosis Date Noted   Skin tear of forearm without complication, initial encounter 12/13/2021   Left foot drop 02/05/2021   Pain of left lower leg 01/17/2021   Strain of calf muscle 01/17/2021   Bilateral  wrist pain 05/11/2020   Body mass index (BMI) 27.0-27.9, adult 05/04/2019   Thoracic spondylosis with myelopathy 05/04/2019   Proximal weakness of extremity 02/25/2019   CKD (chronic kidney disease) stage 3, GFR 30-59 ml/min (HCC)    Unilateral primary osteoarthritis, left knee 09/16/2018   Thoracic compression fracture, sequela 09/16/2018   Hx of total hip arthroplasty, bilateral 09/16/2018   Bilateral primary osteoarthritis of knee 08/04/2018   Essential hypertension 07/08/2018   Osteoarthritis of right knee 05/25/2018   Knee pain, right 12/08/2017   Osteopenia 09/13/2014   Fatty liver disease, nonalcoholic    Past Medical History:  Diagnosis Date   Cataract    CKD (chronic kidney disease) stage 3, GFR 30-59 ml/min (HCC)    Fatty liver disease, nonalcoholic    0981- admitted with encephalopathy unsure of cause, resolved  sponatneously   Hypertension     Family History  Problem Relation Age of Onset   Heart disease Mother    Cancer Father        metastatic unsure of primary   Early death Brother        died at 40 y/o   Liver cancer Maternal Grandmother    Healthy Son     Past Surgical History:  Procedure Laterality Date   FRACTURE SURGERY     right shoulder, both wrists    JOINT REPLACEMENT     HIP- bilateral   Social History   Occupational History   Not on file  Tobacco Use   Smoking status: Never   Smokeless tobacco: Never  Vaping Use   Vaping Use: Never used  Substance and Sexual Activity   Alcohol use: Not Currently   Drug use: Never   Sexual activity: Not Currently    Comment: raised tobacco, retired

## 2022-03-14 ENCOUNTER — Encounter: Payer: 59 | Attending: Physician Assistant | Admitting: Physician Assistant

## 2022-03-14 DIAGNOSIS — N183 Chronic kidney disease, stage 3 unspecified: Secondary | ICD-10-CM | POA: Insufficient documentation

## 2022-03-14 DIAGNOSIS — L97822 Non-pressure chronic ulcer of other part of left lower leg with fat layer exposed: Secondary | ICD-10-CM | POA: Insufficient documentation

## 2022-03-14 DIAGNOSIS — L97812 Non-pressure chronic ulcer of other part of right lower leg with fat layer exposed: Secondary | ICD-10-CM | POA: Diagnosis not present

## 2022-03-14 DIAGNOSIS — I129 Hypertensive chronic kidney disease with stage 1 through stage 4 chronic kidney disease, or unspecified chronic kidney disease: Secondary | ICD-10-CM | POA: Diagnosis not present

## 2022-03-14 DIAGNOSIS — I87331 Chronic venous hypertension (idiopathic) with ulcer and inflammation of right lower extremity: Secondary | ICD-10-CM | POA: Diagnosis not present

## 2022-03-14 DIAGNOSIS — G603 Idiopathic progressive neuropathy: Secondary | ICD-10-CM | POA: Diagnosis not present

## 2022-03-14 NOTE — Progress Notes (Addendum)
ANDRENE, SPOFFORD ([id number]) [id number] Page 1 of 7 Visit Report for 03/14/2022 Chief Complaint Document Details Patient Name: Date of Service: [name], [name] 03/14/2022 3:45 PM Medical Record Number: [id number] Patient Account Number: [id number] Date of Birth/Sex: Treating RN: [birth year] (89 y.o. [name] Primary Care Provider: Jenna Luo Other Clinician: Referring Provider: Treating Provider/Extender: Barbette Merino in Treatment: 9 Information Obtained from: Patient Chief Complaint Right LE Ulcer Electronic Signature(s) Signed: 03/14/2022 3:58:18 PM By: [name] PA-C Entered By: [name] on 03/14/2022 15:58:18 -------------------------------------------------------------------------------- HPI Details Patient Name: Date of Service: LEORA, BONA 03/14/2022 3:45 PM Medical Record Number: [id number] Patient Account Number: [id number] Date of Birth/Sex: Treating RN: 1931-03-05 (89 y.o. [name] Primary Care Provider: Jenna Luo Other Clinician: Referring Provider: Treating Provider/Extender: Barbette Merino in Treatment: 9 History of Present Illness HPI Description: 01-07-2022 upon evaluation today patient appears to be doing poorly in regard to the wound on the right lateral lower extremity. She tells me that this occurred after a fall that she sustained on and around Labor Day weekend. Subsequently following this fall she tells me that she had a wound that really has not improved significantly. She has been seeing her primary care provider and they have been attempting to do what they could to get this improving overall. With that being said the patient has been on Silvadene cream, Neosporin, and more recently soaking with peroxide then wet to dry gauze recommended by primary care provider. Lastly Santyl was advised but they have not been able to get that filled as of  yet. Patient does have a history of neuropathy, chronic kidney disease stage III, and hypertension along with chronic venous insufficiency otherwise she is fairly healthy and has nothing else that should affect her ability to heal. 01-14-2022 upon evaluation today patient appears to be doing well currently in regard to her wound. She has been tolerating the dressing changes without complication. With that being said she still has quite a bit of necrotic tissue buildup on the surface of the wound unfortunately. 01-21-2022 upon evaluation today patient actually is seeming to make excellent progress in regard to her wound. She is tolerating the dressing changes and the Iodoflex seems to be doing a great job at this point. Fortunately I do not see any signs of active infection at this time. 12/26; wound on the right lateral lower leg which was initially trauma is quite a bit better down to half a centimeter in both dimensions. It has no depth. We are using Iodoflex under 3 layer compression. On the left leg she has a skin tear we have been using Xeroform this also looks quite good. [name], [name] ([id number]) [id number] Page 2 of 7 The patient probably has chronic venous insufficiency although she also takes prednisone 10 mg a day adding to the overall fragility of her skin 02-12-2022 upon evaluation today patient appears to be doing well currently in regard to her wound. She has been tolerating the dressing changes without complication. The right leg is looking better the left leg is not feeling quite as much as we would like to see. I think that she has some necrotic tissue that needs to be cleaned away to be honest. Fortunately I do not see any signs of active infection locally nor systemically at this time which is great news. 02-19-2021 upon evaluation today patient's wounds actually are showing signs of improvement which is great news. Fortunately I do not  see any signs  of active infection locally or systemically which is great news. No fevers, chills, nausea, vomiting, or diarrhea. Upon inspection patient's wound actually is showing signs of improvement although she tells me that the left leg did have more discomfort after last week although did have to perform a fairly significant debridement last week. 02-26-2022 upon evaluation today patient's wounds actually are showing signs of improvement they are not draining nearly as much but this means they also have become a little bit more dry in general and therefore the dressing material was sticking unfortunately. I do think that the collagen is done well but I believe on the right leg this is doing so well that possibly the switch of the Xeroform could be helpful on the left leg I think the collagen is still the better way to go to be honest but we may need to do something to try to keep this from drying out. 03-05-2022 upon evaluation today patient appears to be doing well currently in regard to her wound. She has been tolerating the dressing changes without complication. Fortunately there does not appear to be any signs of infection locally nor systemically the right leg looks healed the left leg looks better. 03-14-2022 upon evaluation today patient appears to be doing pretty well currently in regard to her wound. This in fact is showing signs of improvement the right leg is still close the left leg is looking better in fact there is not even a need for sharp debridement today which is good news as well she really did not want me to perform any debridement whatsoever. She already said that before even got a chance to look at the wound. Electronic Signature(s) Signed: 03/14/2022 5:41:49 PM By: [name] PA-C Entered By: [name] on 03/14/2022 17:41:49 -------------------------------------------------------------------------------- Physical Exam Details Patient Name: Date of Service: [name], [name]  03/14/2022 3:45 PM Medical Record Number: [id number] Patient Account Number: [id number] Date of Birth/Sex: Treating RN: 1932-01-24 (89 y.o. [name] Primary Care Provider: Jenna Luo Other Clinician: Referring Provider: Treating Provider/Extender: Barbette Merino in Treatment: 9 Constitutional Well-nourished and well-hydrated in no acute distress. Respiratory normal breathing without difficulty. Psychiatric this patient is able to make decisions and demonstrates good insight into disease process. Alert and Oriented x 3. pleasant and cooperative. Notes Upon inspection patient's wound bed actually showed signs of good granulation epithelization at this point. I am actually very pleased with where things stand I do believe that she is headed in the right direction. No sharp debridement performed today as the wound actually appears to be very clean. The patient was also adamantly opposed to any further debridement. Electronic Signature(s) Signed: 03/14/2022 5:42:10 PM By: [name] PA-C Entered By: [name] on 03/14/2022 17:42:10 [name] ([id number]) [id number] Page 3 of 7 -------------------------------------------------------------------------------- Physician Orders Details Patient Name: Date of Service: [name], [name] 03/14/2022 3:45 PM Medical Record Number: [id number] Patient Account Number: [id number] Date of Birth/Sex: Treating RN: May 12, 1931 (89 y.o. [name] Primary Care Provider: Jenna Luo Other Clinician: Referring Provider: Treating Provider/Extender: Barbette Merino in Treatment: 9 Verbal / Phone Orders: No Diagnosis Coding ICD-10 Coding Code Description I87.331 Chronic venous hypertension (idiopathic) with ulcer and inflammation of right lower extremity G60.3 Idiopathic progressive neuropathy L97.812 Non-pressure chronic ulcer of other part of right lower leg  with fat layer exposed L97.822 Non-pressure chronic ulcer of other part of left lower leg with fat layer exposed  N18.30 Chronic kidney disease, stage 3 unspecified I10 Essential (primary) hypertension Follow-up Appointments Return Appointment in 1 week. Bathing/ Shower/ Hygiene May shower with wound dressing protected with water repellent cover or cast protector. No tub bath. Anesthetic (Use 'Patient Medications' Section for Anesthetic Order Entry) Lidocaine applied to wound bed Edema Control - Lymphedema / Segmental Compressive Device / Other Tubigrip single layer applied. - size D left leg Elevate, Exercise Daily and A void Standing for Long Periods of Time. Elevate legs to the level of the heart and pump ankles as often as possible Elevate leg(s) parallel to the floor when sitting. Wound Treatment Wound #3 - Lower Leg Wound Laterality: Left, Lateral Prim Dressing: Xeroform-HBD 2x2 (in/in) 3 x Per Week/30 Days ary Discharge Instructions: Apply Xeroform-HBD 2x2 (in/in) as directed Secondary Dressing: (BORDER) Zetuvit Plus SILICONE BORDER Dressing 4x4 (in/in) 3 x Per Week/30 Days Discharge Instructions: Please do not put silicone bordered dressings under wraps. Use non-bordered dressing only. Secured With: Tubigrip Size D, 3x10 (in/yd) 3 x Per Week/30 Days Discharge Instructions: single layer Electronic Signature(s) Signed: 03/14/2022 6:29:11 PM By: [name] PA-C Signed: 03/15/2022 12:08:06 PM By: Carlene Coria RN Entered By: Carlene Coria on 03/14/2022 16:40:20 [name] ([id number]) [id number] Page 4 of 7 -------------------------------------------------------------------------------- Problem List Details Patient Name: Date of Service: [name], [name] 03/14/2022 3:45 PM Medical Record Number: [id number] Patient Account Number: [id number] Date of Birth/Sex: Treating RN: [birth year] (89 y.o. [name] Primary Care Provider: Jenna Luo Other Clinician: Referring Provider: Treating Provider/Extender: Barbette Merino in Treatment: 9 Active Problems ICD-10 Encounter Code Description Active Date MDM Diagnosis I87.331 Chronic venous hypertension (idiopathic) with ulcer and inflammation of right 01/07/2022 No Yes lower extremity G60.3 Idiopathic progressive neuropathy 01/07/2022 No Yes L97.812 Non-pressure chronic ulcer of other part of right lower leg with fat layer 01/07/2022 No Yes exposed L97.822 Non-pressure chronic ulcer of other part of left lower leg with fat layer exposed1/23/2024 No Yes N18.30 Chronic kidney disease, stage 3 unspecified 01/07/2022 No Yes I10 Essential (primary) hypertension 01/07/2022 No Yes Inactive Problems Resolved Problems Electronic Signature(s) Signed: 03/14/2022 3:58:15 PM By: [name] PA-C Entered By: [name] on 03/14/2022 15:58:15 [name] ([id number]) [id number] Page 5 of 7 -------------------------------------------------------------------------------- Progress Note Details Patient Name: Date of Service: [name], [name] 03/14/2022 3:45 PM Medical Record Number: [id number] Patient Account Number: [id number] Date of Birth/Sex: Treating RN: [birth year] (89 y.o. [name] Primary Care Provider: Jenna Luo Other Clinician: Referring Provider: Treating Provider/Extender: Barbette Merino in Treatment: 9 Subjective Chief Complaint Information obtained from Patient Right LE Ulcer History of Present Illness (HPI) 01-07-2022 upon evaluation today patient appears to be doing poorly in regard to the wound on the right lateral lower extremity. She tells me that this occurred after a fall that she sustained on and around Labor Day weekend. Subsequently following this fall she tells me that she had a wound that really has not improved significantly. She has been seeing her primary care  provider and they have been attempting to do what they could to get this improving overall. With that being said the patient has been on Silvadene cream, Neosporin, and more recently soaking with peroxide then wet to dry gauze recommended by primary care provider. Lastly Santyl was advised but they have not been able to get that filled as of yet. Patient does have a history of neuropathy, chronic kidney disease stage III, and hypertension along with chronic venous insufficiency  otherwise she is fairly healthy and has nothing else that should affect her ability to heal. 01-14-2022 upon evaluation today patient appears to be doing well currently in regard to her wound. She has been tolerating the dressing changes without complication. With that being said she still has quite a bit of necrotic tissue buildup on the surface of the wound unfortunately. 01-21-2022 upon evaluation today patient actually is seeming to make excellent progress in regard to her wound. She is tolerating the dressing changes and the Iodoflex seems to be doing a great job at this point. Fortunately I do not see any signs of active infection at this time. 12/26; wound on the right lateral lower leg which was initially trauma is quite a bit better down to half a centimeter in both dimensions. It has no depth. We are using Iodoflex under 3 layer compression. On the left leg she has a skin tear we have been using Xeroform this also looks quite good. The patient probably has chronic venous insufficiency although she also takes prednisone 10 mg a day adding to the overall fragility of her skin 02-12-2022 upon evaluation today patient appears to be doing well currently in regard to her wound. She has been tolerating the dressing changes without complication. The right leg is looking better the left leg is not feeling quite as much as we would like to see. I think that she has some necrotic tissue that needs to be cleaned away to be honest.  Fortunately I do not see any signs of active infection locally nor systemically at this time which is great news. 02-19-2021 upon evaluation today patient's wounds actually are showing signs of improvement which is great news. Fortunately I do not see any signs of active infection locally or systemically which is great news. No fevers, chills, nausea, vomiting, or diarrhea. Upon inspection patient's wound actually is showing signs of improvement although she tells me that the left leg did have more discomfort after last week although did have to perform a fairly significant debridement last week. 02-26-2022 upon evaluation today patient's wounds actually are showing signs of improvement they are not draining nearly as much but this means they also have become a little bit more dry in general and therefore the dressing material was sticking unfortunately. I do think that the collagen is done well but I believe on the right leg this is doing so well that possibly the switch of the Xeroform could be helpful on the left leg I think the collagen is still the better way to go to be honest but we may need to do something to try to keep this from drying out. 03-05-2022 upon evaluation today patient appears to be doing well currently in regard to her wound. She has been tolerating the dressing changes without complication. Fortunately there does not appear to be any signs of infection locally nor systemically the right leg looks healed the left leg looks better. 03-14-2022 upon evaluation today patient appears to be doing pretty well currently in regard to her wound. This in fact is showing signs of improvement the right leg is still close the left leg is looking better in fact there is not even a need for sharp debridement today which is good news as well she really did not want me to perform any debridement whatsoever. She already said that before even got a chance to look at the  wound. Objective Constitutional Well-nourished and well-hydrated in no acute distress. Vitals Time Taken: 4:07 PM,  Weight: 140 lbs, Temperature: 98.4 F, Pulse: 76 bpm, Respiratory Rate: 18 breaths/min, Blood Pressure: 149/62 mmHg. Respiratory normal breathing without difficulty. Psychiatric this patient is able to make decisions and demonstrates good insight into disease process. Alert and Oriented x 3. pleasant and cooperative. General Notes: Upon inspection patient's wound bed actually showed signs of good granulation epithelization at this point. I am actually very pleased with where things stand I do believe that she is headed in the right direction. No sharp debridement performed today as the wound actually appears to be very clean. The patient was also adamantly opposed to any further debridement. Integumentary (Hair, Skin) Wound #3 status is Open. Original cause of wound was Skin Tear/Laceration. The date acquired was: 01/21/2022. The wound has been in treatment 7 weeks. [name], [name] ([id number]) [id number] Page 6 of 7 The wound is located on the Left,Lateral Lower Leg. The wound measures 2.3cm length x 1.1cm width x 0.2cm depth; 1.987cm^2 area and 0.397cm^3 volume. There is Fat Layer (Subcutaneous Tissue) exposed. There is a medium amount of serosanguineous drainage noted. There is no granulation within the wound bed. There is a medium (34-66%) amount of necrotic tissue within the wound bed including Eschar. Assessment Active Problems ICD-10 Chronic venous hypertension (idiopathic) with ulcer and inflammation of right lower extremity Idiopathic progressive neuropathy Non-pressure chronic ulcer of other part of right lower leg with fat layer exposed Non-pressure chronic ulcer of other part of left lower leg with fat layer exposed Chronic kidney disease, stage 3 unspecified Essential (primary) hypertension Plan Follow-up Appointments: Return  Appointment in 1 week. Bathing/ Shower/ Hygiene: May shower with wound dressing protected with water repellent cover or cast protector. No tub bath. Anesthetic (Use 'Patient Medications' Section for Anesthetic Order Entry): Lidocaine applied to wound bed Edema Control - Lymphedema / Segmental Compressive Device / Other: Tubigrip single layer applied. - size D left leg Elevate, Exercise Daily and Avoid Standing for Long Periods of Time. Elevate legs to the level of the heart and pump ankles as often as possible Elevate leg(s) parallel to the floor when sitting. WOUND #3: - Lower Leg Wound Laterality: Left, Lateral Prim Dressing: Xeroform-HBD 2x2 (in/in) 3 x Per Week/30 Days ary Discharge Instructions: Apply Xeroform-HBD 2x2 (in/in) as directed Secondary Dressing: (BORDER) Zetuvit Plus SILICONE BORDER Dressing 4x4 (in/in) 3 x Per Week/30 Days Discharge Instructions: Please do not put silicone bordered dressings under wraps. Use non-bordered dressing only. Secured With: Tubigrip Size D, 3x10 (in/yd) 3 x Per Week/30 Days Discharge Instructions: single layer 1. I am recommend currently that we have the patient continue to monitor for any signs of infection or worsening obviously based on what I am seeing right now I think we are on the right track I am hopeful we will be able to get this close shortly. 2. I am also can recommend that we continue with the Xeroform gauze and would recommend that we use a bordered Zetuvit to cover and then subsequently Tubigrip to secure in place. 3. I am also can recommend she should change it 3 times per week. We will see patient back for reevaluation in 1 week here in the clinic. If anything worsens or changes patient will contact our office for additional recommendations. Electronic Signature(s) Signed: 03/14/2022 5:42:43 PM By: [name] PA-C Entered By: [name] on 03/14/2022  17:42:43 -------------------------------------------------------------------------------- SuperBill Details Patient Name: Date of Service: [name], [name] 03/14/2022 Medical Record Number: [id number] Patient Account Number: [id number] Date of Birth/Sex: Treating RN: 1931-03-03 (  89 y.o. [name] Primary Care Provider: Jenna Luo Other Clinician: Referring Provider: Treating Provider/Extender: Veronia Beets [place], Michell Heinrich ([id number]) [phone].pdf Page 7 of 7 Weeks in Treatment: 9 Diagnosis Coding ICD-10 Codes Code Description I87.331 Chronic venous hypertension (idiopathic) with ulcer and inflammation of right lower extremity G60.3 Idiopathic progressive neuropathy L97.812 Non-pressure chronic ulcer of other part of right lower leg with fat layer exposed L97.822 Non-pressure chronic ulcer of other part of left lower leg with fat layer exposed N18.30 Chronic kidney disease, stage 3 unspecified I10 Essential (primary) hypertension Facility Procedures : CPT4 Code: [id number] Description: [id number] - WOUND CARE VISIT-LEV 2 EST PT Modifier: Quantity: 1 Physician Procedures : CPT4 Code Description Modifier [id number] - WC PHYS LEVEL 3 - EST PT ICD-10 Diagnosis Description I87.331 Chronic venous hypertension (idiopathic) with ulcer and inflammation of right lower extremity G60.3 Idiopathic progressive neuropathy L97.812  Non-pressure chronic ulcer of other part of right lower leg with fat layer exposed L97.822 Non-pressure chronic ulcer of other part of left lower leg with fat layer exposed Quantity: 1 Electronic Signature(s) Signed: 03/14/2022 5:42:54 PM By: [name] PA-C Entered By: [name] on 03/14/2022 17:42:54

## 2022-03-14 NOTE — Progress Notes (Addendum)
[name] ([id number]) [phone].pdf Page 1 of 9 Visit Report for 03/14/2022 Arrival Information Details Patient Name: Date of Service: [name], [name] 03/14/2022 3:45 PM Medical Record Number: [id number] Patient Account Number: [id number] Date of Birth/Sex: Treating RN: [name] (89 y.o. [name] Primary Care [name]: [name] Other Clinician: Referring [name]: Treating [name]/Extender: [name] in Treatment: 9 Visit Information History Since Last Visit Added or deleted any medications: No Patient Arrived: [name] Any new allergies or adverse reactions: No Arrival Time: 16:04 Had a fall or experienced change in No Accompanied By: self activities of daily living that Colleen affect Transfer Assistance: None risk of falls: Patient Identification Verified: Yes Signs or symptoms of abuse/neglect since last visito No Secondary Verification Process Completed: Yes Hospitalized since last visit: No Patient Requires Transmission-Based Precautions: No Implantable device outside of the clinic excluding No Patient Has Alerts: No cellular tissue based products placed in the center since last visit: Has Dressing in Place as Prescribed: Yes Has Compression in Place as Prescribed: Yes Pain Present Now: No Electronic Signature(s) Signed: 03/15/2022 12:08:06 PM By: Colleen Coria RN Entered By: [name] on 03/14/2022 16:07:22 -------------------------------------------------------------------------------- Clinic Level of Care Assessment Details Patient Name: Date of Service: [name], [name] 03/14/2022 3:45 PM Medical Record Number: [id number] Patient Account Number: [id number] Date of Birth/Sex: Treating RN: [name] (89 y.o. [name] Primary Care [name]: [name] Other Clinician: Referring [name]: Treating Aylee Littrell/Extender: [name] in Treatment: 9 Clinic Level of  Care Assessment Items TOOL 4 Quantity Score X- 1 0 Use when only an EandM is performed on FOLLOW-UP visit ASSESSMENTS - Nursing Assessment / Reassessment X- 1 10 Reassessment of Co-morbidities (includes updates in patient status) X- 1 5 Reassessment of Adherence to Treatment Plan [name], [name] ([id number]) [id number] Page 2 of 9 ASSESSMENTS - Wound and Skin A ssessment / Reassessment X - Simple Wound Assessment / Reassessment - one wound 1 5 []$  - 0 Complex Wound Assessment / Reassessment - multiple wounds []$  - 0 Dermatologic / Skin Assessment (not related to wound area) ASSESSMENTS - Focused Assessment []$  - 0 Circumferential Edema Measurements - multi extremities []$  - 0 Nutritional Assessment / Counseling / Intervention []$  - 0 Lower Extremity Assessment (monofilament, tuning fork, pulses) []$  - 0 Peripheral Arterial Disease Assessment (using hand held doppler) ASSESSMENTS - Ostomy and/or Continence Assessment and Care []$  - 0 Incontinence Assessment and Management []$  - 0 Ostomy Care Assessment and Management (repouching, etc.) PROCESS - Coordination of Care X - Simple Patient / Family Education for ongoing care 1 15 []$  - 0 Complex (extensive) Patient / Family Education for ongoing care []$  - 0 Staff obtains Programmer, systems, Records, T Results / Process Orders est []$  - 0 Staff telephones HHA, Nursing Homes / Clarify orders / etc []$  - 0 Routine Transfer to another Facility (non-emergent condition) []$  - 0 Routine Hospital Admission (non-emergent condition) []$  - 0 New Admissions / Biomedical engineer / Ordering NPWT Apligraf, etc. , []$  - 0 Emergency Hospital Admission (emergent condition) X- 1 10 Simple Discharge Coordination []$  - 0 Complex (extensive) Discharge Coordination PROCESS - Special Needs []$  - 0 Pediatric / Minor Patient Management []$  - 0 Isolation Patient Management []$  - 0 Hearing / Language / Visual special needs []$  -  0 Assessment of Community assistance (transportation, D/C planning, etc.) []$  - 0 Additional assistance / Altered mentation []$  - 0 Support Surface(s) Assessment (bed, cushion, seat, etc.) INTERVENTIONS - Wound Cleansing / Measurement X -  Simple Wound Cleansing - one wound 1 5 []$  - 0 Complex Wound Cleansing - multiple wounds X- 1 5 Wound Imaging (photographs - any number of wounds) []$  - 0 Wound Tracing (instead of photographs) X- 1 5 Simple Wound Measurement - one wound []$  - 0 Complex Wound Measurement - multiple wounds INTERVENTIONS - Wound Dressings X - Small Wound Dressing one or multiple wounds 1 10 []$  - 0 Medium Wound Dressing one or multiple wounds []$  - 0 Large Wound Dressing one or multiple wounds []$  - 0 Application of Medications - topical []$  - 0 Application of Medications - injection INTERVENTIONS - Miscellaneous []$  - 0 External ear exam [name], [name] ([id number]) [id number] Page 3 of 9 []$  - 0 Specimen Collection (cultures, biopsies, blood, body fluids, etc.) []$  - 0 Specimen(s) / Culture(s) sent or taken to Lab for analysis []$  - 0 Patient Transfer (multiple staff / Harrel Lemon Lift / Similar devices) []$  - 0 Simple Staple / Suture removal (25 or less) []$  - 0 Complex Staple / Suture removal (26 or more) []$  - 0 Hypo / Hyperglycemic Management (close monitor of Blood Glucose) []$  - 0 Ankle / Brachial Index (ABI) - do not check if billed separately X- 1 5 Vital Signs Has the patient been seen at the hospital within the last three years: Yes Total Score: 75 Level Of Care: New/Established - Level 2 Electronic Signature(s) Signed: 03/15/2022 12:08:06 PM By: Colleen Coria RN Entered By: [name] on 03/14/2022 16:38:50 -------------------------------------------------------------------------------- Encounter Discharge Information Details Patient Name: Date of Service: [name] 03/14/2022 3:45 PM Medical Record Number:  [id number] Patient Account Number: [id number] Date of Birth/Sex: Treating RN: [birth year] (89 y.o. [name] Primary Care Joangel Vanosdol: [name] Other Clinician: Referring Emilyn Ruble: Treating Tibor Lemmons/Extender: [name] in Treatment: 9 Encounter Discharge Information Items Discharge Condition: Stable Ambulatory Status: Walker Discharge Destination: Home Transportation: Private Auto Accompanied By: self Schedule Follow-up Appointment: Yes Clinical Summary of Care: Electronic Signature(s) Signed: 03/14/2022 4:41:06 PM By: Colleen Coria RN Entered By: [name] on 03/14/2022 16:41:06 Lower Extremity Assessment Details -------------------------------------------------------------------------------- [name] ([id number]) [id number] Page 4 of 9 Patient Name: Date of Service: [name], [name] 03/14/2022 3:45 PM Medical Record Number: [id number] Patient Account Number: [id number] Date of Birth/Sex: Treating RN: [birth year] (89 y.o. [name] Primary Care Kamori Barbier: [name] Other Clinician: Referring Chace Klippel: Treating Ura Yingling/Extender: Veronia Beets Weeks in Treatment: 9 Edema Assessment Left: Right: Assessed: No No Edema: Yes Yes Calf Left: Right: Point of Measurement: 32 cm From Medial Instep 36 cm 36 cm Ankle Left: Right: Point of Measurement: 11 cm From Medial Instep 22 cm 22 cm Vascular Assessment Left: Right: Pulses: Dorsalis Pedis Palpable: Yes Yes Electronic Signature(s) Signed: 03/15/2022 12:08:06 PM By: Colleen Coria RN Entered By: [name] on 03/14/2022 16:17:29 -------------------------------------------------------------------------------- Multi Wound Chart Details Patient Name: Date of Service: [name] 03/14/2022 3:45 PM Medical Record Number: [id number] Patient Account Number: [id number] Date of Birth/Sex: Treating RN: [birth year]Fox (89 y.o. [name] Primary Care Vaibhav Fogleman: [name] Other Clinician: Referring Carzell Saldivar: Treating Christoher Drudge/Extender: [name] in Treatment: 9 Vital Signs Height(in): Pulse(bpm): 76 Weight(lbs): 140 Blood Pressure(mmHg): 149/62 Body Mass Index(BMI): Temperature(F): 98.4 Respiratory Rate(breaths/min): 18 [3:Photos:] [N/A:N/A] Left, Lateral Lower Leg N/A N/A Wound Location: Skin Tear/Laceration N/A N/A Wounding Event: [name], [name] ([id number]) [id number] Page 5 of 9 Skin T ear N/A N/A Primary Etiology: Hypertension N/A N/A Comorbid History: 01/21/2022 N/A N/A Date Acquired: 7  N/A N/A Weeks of Treatment: Open N/A N/A Wound Status: No N/A N/A Wound Recurrence: 2.3x1.1x0.2 N/A N/A Measurements L x W x D (cm) 1.987 N/A N/A A (cm) : rea 0.397 N/A N/A Volume (cm) : 20.90% N/A N/A % Reduction in A rea: -58.20% N/A N/A % Reduction in Volume: Full Thickness Without Exposed N/A N/A Classification: Support Structures Medium N/A N/A Exudate A mount: Serosanguineous N/A N/A Exudate Type: red, brown N/A N/A Exudate Color: None Present (0%) N/A N/A Granulation Amount: Medium (34-66%) N/A N/A Necrotic Amount: Eschar N/A N/A Necrotic Tissue: Fat Layer (Subcutaneous Tissue): Yes N/A N/A Exposed Structures: Fascia: No Tendon: No Muscle: No Joint: No Bone: No None N/A N/A Epithelialization: Treatment Notes Electronic Signature(s) Signed: 03/15/2022 12:08:06 PM By: Colleen Coria RN Entered By: [name] on 03/14/2022 16:17:36 -------------------------------------------------------------------------------- Multi-Disciplinary Care Plan Details Patient Name: Date of Service: [name] 03/14/2022 3:45 PM Medical Record Number: [id number] Patient Account Number: [id number] Date of Birth/Sex: Treating RN: [birth year]Fox (89 y.o. [name] Primary Care Tiesha Marich: [name] Other Clinician: Referring  Dymir Neeson: Treating Earlean Fidalgo/Extender: [name] in Treatment: 9 Active Inactive Wound/Skin Impairment Nursing Diagnoses: Knowledge deficit related to ulceration/compromised skin integrity Goals: Patient/caregiver will verbalize understanding of skin care regimen Date Initiated: 01/07/2022 Target Resolution Date: 03/05/2022 Goal Status: Active Ulcer/skin breakdown will have a volume reduction of 30% by week 4 Date Initiated: 01/07/2022 Date Inactivated: 02/28/2022 Target Resolution Date: 02/07/2022 Goal Status: Met Ulcer/skin breakdown will have a volume reduction of 50% by week 8 Date Initiated: 01/07/2022 Date Inactivated: 03/14/2022 Target Resolution Date: 03/10/2022 Goal Status: Unmet Unmet Reason: comorbidities Ulcer/skin breakdown will have a volume reduction of 80% by week 12 Date Initiated: 01/07/2022 Target Resolution Date: 04/08/2022 [name], [name] ([id number]) [phone].pdf Page 6 of 9 Goal Status: Active Ulcer/skin breakdown will heal within 14 weeks Date Initiated: 01/07/2022 Target Resolution Date: 05/10/2022 Goal Status: Active Interventions: Assess patient/caregiver ability to obtain necessary supplies Assess patient/caregiver ability to perform ulcer/skin care regimen upon admission and as needed Assess ulceration(s) every visit Notes: Electronic Signature(s) Signed: 03/15/2022 12:08:06 PM By: Colleen Coria RN Entered By: [name] on 03/14/2022 16:18:23 -------------------------------------------------------------------------------- Pain Assessment Details Patient Name: Date of Service: [name], [name] 03/14/2022 3:45 PM Medical Record Number: [id number] Patient Account Number: [id number] Date of Birth/Sex: Treating RN: [birth year] (89 y.o. [name] Primary Care Wilsie Kern: [name] Other Clinician: Referring Enos Muhl: Treating Jamal Haskin/Extender: [name] in Treatment: 9 Active  Problems Location of Pain Severity and Description of Pain Patient Has Paino No Site Locations Pain Management and Medication Current Pain Management: Electronic Signature(s) Signed: 03/15/2022 12:08:06 PM By: Colleen Coria RN Entered By: [name] on 03/14/2022 16:08:56 [name] ([id number]) [id number] Page 7 of 9 -------------------------------------------------------------------------------- Patient/Caregiver Education Details Patient Name: Date of Service: [name], [name] 2/8/2024andnbsp3:45 PM Medical Record Number: [id number] Patient Account Number: [id number] Date of Birth/Gender: Treating RN: [birth year]Fox (89 y.o. [name] Primary Care Physician: [name] Other Clinician: Referring Physician: Treating Physician/Extender: [name] in Treatment: 9 Education Assessment Education Provided To: Patient Education Topics Provided Wound/Skin Impairment: Methods: Explain/Verbal Responses: State content correctly [employer]) Signed: 03/15/2022 12:08:06 PM By: Colleen Coria RN Entered By: [name] on 03/14/2022 16:17:49 -------------------------------------------------------------------------------- Wound Assessment Details Patient Name: Date of Service: [name], [name] 03/14/2022 3:45 PM Medical Record Number: [id number] Patient Account Number: [id number] Date of Birth/Sex: Treating RN: Colleen [birth year], Fox (89 y.o. [name] Primary Care Aritza Brunet: [name] Other Clinician:  Referring Kymberly Blomberg: Treating Diora Bellizzi/Extender: Veronia Beets Weeks in Treatment: 9 Wound Status Wound Number: 3 Primary Etiology: Skin Tear Wound Location: Left, Lateral Lower Leg Wound Status: Open Wounding Event: Skin Tear/Laceration Comorbid History: Hypertension Date Acquired: 01/21/2022 Weeks Of Treatment: 7 Clustered Wound: No Photos [name], [name] ([id number])  [id number] Page 8 of 9 Wound Measurements Length: (cm) 2.3 Width: (cm) 1.1 Depth: (cm) 0.2 Area: (cm) 1.987 Volume: (cm) 0.397 % Reduction in Area: 20.9% % Reduction in Volume: -58.2% Epithelialization: None Wound Description Classification: Full Thickness Without Exposed Support Structures Exudate Amount: Medium Exudate Type: Serosanguineous Exudate Color: red, brown Foul Odor After Cleansing: No Slough/Fibrino Yes Wound Bed Granulation Amount: None Present (0%) Exposed Structure Necrotic Amount: Medium (34-66%) Fascia Exposed: No Necrotic Quality: Eschar Fat Layer (Subcutaneous Tissue) Exposed: Yes Tendon Exposed: No Muscle Exposed: No Joint Exposed: No Bone Exposed: No Treatment Notes Wound #3 (Lower Leg) Wound Laterality: Left, Lateral Cleanser Peri-Wound Care Topical Primary Dressing Xeroform-HBD 2x2 (in/in) Discharge Instruction: Apply Xeroform-HBD 2x2 (in/in) as directed Secondary Dressing (BORDER) Zetuvit Plus SILICONE BORDER Dressing 4x4 (in/in) Discharge Instruction: Please do not put silicone bordered dressings under wraps. Use non-bordered dressing only. Secured With Tubigrip Size D, 3x10 (in/yd) Discharge Instruction: single layer Compression Wrap Compression Stockings Add-Ons Electronic Signature(s) Signed: 03/15/2022 12:08:06 PM By: Colleen Coria RN Entered By: [name] on 03/14/2022 16:15:56 [name] ([id number]) [id number] Page 9 of 9 -------------------------------------------------------------------------------- Vitals Details Patient Name: Date of Service: [name], [name] 03/14/2022 3:45 PM Medical Record Number: [id number] Patient Account Number: [id number] Date of Birth/Sex: Treating RN: [birth year]Fox (89 y.o. [name] Primary Care Elaina Cara: [name] Other Clinician: Referring Damita Eppard: Treating Shadai Mcclane/Extender: [name] in Treatment:  9 Vital Signs Time Taken: 16:07 Temperature (F): 98.4 Weight (lbs): 140 Pulse (bpm): 76 Respiratory Rate (breaths/min): 18 Blood Pressure (mmHg): 149/62 Reference Range: 80 - 120 mg / dl Electronic Signature(s) Signed: 03/15/2022 12:08:06 PM By: Colleen Coria RN Entered By: [name] on 03/14/2022 16:08:38

## 2022-03-22 ENCOUNTER — Ambulatory Visit: Payer: 59

## 2022-03-22 NOTE — Progress Notes (Signed)
Pt reschedule AWV to 04/09/22 at 9am

## 2022-03-25 ENCOUNTER — Encounter: Payer: 59 | Admitting: Physician Assistant

## 2022-03-25 DIAGNOSIS — I87331 Chronic venous hypertension (idiopathic) with ulcer and inflammation of right lower extremity: Secondary | ICD-10-CM | POA: Diagnosis not present

## 2022-03-25 DIAGNOSIS — I129 Hypertensive chronic kidney disease with stage 1 through stage 4 chronic kidney disease, or unspecified chronic kidney disease: Secondary | ICD-10-CM | POA: Diagnosis not present

## 2022-03-25 DIAGNOSIS — N183 Chronic kidney disease, stage 3 unspecified: Secondary | ICD-10-CM | POA: Diagnosis not present

## 2022-03-25 DIAGNOSIS — L97822 Non-pressure chronic ulcer of other part of left lower leg with fat layer exposed: Secondary | ICD-10-CM | POA: Diagnosis not present

## 2022-03-25 DIAGNOSIS — L97812 Non-pressure chronic ulcer of other part of right lower leg with fat layer exposed: Secondary | ICD-10-CM | POA: Diagnosis not present

## 2022-03-25 DIAGNOSIS — G603 Idiopathic progressive neuropathy: Secondary | ICD-10-CM | POA: Diagnosis not present

## 2022-03-25 NOTE — Progress Notes (Signed)
[name] ([id number]) [phone].pdf Page 1 of 8 Visit Report for 03/25/2022 Arrival Information Details Patient Name: Date of Service: [name], [name] 03/25/2022 2:15 PM Medical Record Number: [id number] Patient Account Number: [id number] Date of Birth/Sex: Treating RN: 1931-07-01 (89 y.o. [name] Primary Care [name]: [name] Other Clinician: Referring [name]: Treating [name]/Extender: [name] in Treatment: 11 Visit Information History Since Last Visit Added or deleted any medications: No Patient Arrived: [name] Any new allergies or adverse reactions: No Arrival Time: 14:24 Had a fall or experienced change in No Accompanied By: self activities of daily living that may affect Transfer Assistance: None risk of falls: Patient Identification Verified: Yes Hospitalized since last visit: No Secondary Verification Process Completed: Yes Has Dressing in Place as Prescribed: Yes Patient Requires Transmission-Based Precautions: No Pain Present Now: No Patient Has Alerts: No Electronic Signature(s) Unsigned Entered By: [name] on 03/25/2022 14:32:10 -------------------------------------------------------------------------------- Clinic Level of Care Assessment Details Patient Name: Date of Service: [name] 03/25/2022 2:15 PM Medical Record Number: [id number] Patient Account Number: [id number] Date of Birth/Sex: Treating RN: 08-11-1931 (89 y.o. [name] Primary Care [name]: [name] Other Clinician: Referring [name]: Treating [name]/Extender: [name] in Treatment: 11 Clinic Level of Care Assessment Items TOOL 1 Quantity Score []$  - 0 Use when EandM and Procedure is performed on INITIAL visit ASSESSMENTS - Nursing Assessment / Reassessment []$  - 0 General Physical Exam (combine w/ comprehensive assessment (listed just below) when performed on  new pt. evals) []$  - 0 Comprehensive Assessment (HX, ROS, Risk Assessments, Wounds Hx, etc.) ASSESSMENTS - Wound and Skin Assessment / Reassessment []$  - 0 Dermatologic / Skin Assessment (not related to wound area) [name], [name] ([id number].pdf Page 2 of 8 ASSESSMENTS - Ostomy and/or Continence Assessment and Care []$  - 0 Incontinence Assessment and Management []$  - 0 Ostomy Care Assessment and Management (repouching, etc.) PROCESS - Coordination of Care []$  - 0 Simple Patient / Family Education for ongoing care []$  - 0 Complex (extensive) Patient / Family Education for ongoing care []$  - 0 Staff obtains Programmer, systems, Records, T Results / Process Orders est []$  - 0 Staff telephones HHA, Nursing Homes / Clarify orders / etc []$  - 0 Routine Transfer to another Facility (non-emergent condition) []$  - 0 Routine Hospital Admission (non-emergent condition) []$  - 0 New Admissions / Biomedical engineer / Ordering NPWT Apligraf, etc. , []$  - 0 Emergency Hospital Admission (emergent condition) PROCESS - Special Needs []$  - 0 Pediatric / Minor Patient Management []$  - 0 Isolation Patient Management []$  - 0 Hearing / Language / Visual special needs []$  - 0 Assessment of Community assistance (transportation, D/C planning, etc.) []$  - 0 Additional assistance / Altered mentation []$  - 0 Support Surface(s) Assessment (bed, cushion, seat, etc.) INTERVENTIONS - Miscellaneous []$  - 0 External ear exam []$  - 0 Patient Transfer (multiple staff / Civil Service fast streamer / Similar devices) []$  - 0 Simple Staple / Suture removal (25 or less) []$  - 0 Complex Staple / Suture removal (26 or more) []$  - 0 Hypo/Hyperglycemic Management (do not check if billed separately) []$  - 0 Ankle / Brachial Index (ABI) - do not check if billed separately Has the patient been seen at the hospital within the last three years: Yes Total Score: 0 Level Of Care: ____ Electronic  Signature(s) Unsigned Entered By: [name] on 03/25/2022 14:53:40 -------------------------------------------------------------------------------- Encounter Discharge Information Details Patient Name: Date of Service: [name], [name] 03/25/2022 2:15 PM Medical Record  Number: [id number] Patient Account Number: [id number] Date of Birth/Sex: Treating RN: 1932/01/02 (89 y.o. [name] Primary Care [name]: [name] Other Clinician: Referring Kvion Shapley: Treating Nagee Goates/Extender: [name] in Treatment: [address], [place] ([id number]) [id number] Page 3 of 8 Encounter Discharge Information Items Post Procedure Vitals Discharge Condition: Stable Temperature (F): 98.0 Ambulatory Status: Walker Pulse (bpm): 86 Discharge Destination: Home Respiratory Rate (breaths/min): 16 Transportation: Private Auto Blood Pressure (mmHg): 131/62 Accompanied By: self Schedule Follow-up Appointment: Yes Clinical Summary of Care: Electronic Signature(s) Unsigned Entered By: [name] on 03/25/2022 14:55:48 -------------------------------------------------------------------------------- Lower Extremity Assessment Details Patient Name: Date of Service: [name], [name] 03/25/2022 2:15 PM Medical Record Number: [id number] Patient Account Number: [id number] Date of Birth/Sex: Treating RN: July 14, 1931 (89 y.o. [name] Primary Care Yaris Ferrell: [name] Other Clinician: Referring Raisha Brabender: Treating [name]/Extender: [name] Weeks in Treatment: 11 Edema Assessment Assessed: [name]: Yes] [name]: No] [Left: Edema] [Right: :] Calf Left: Right: Point of Measurement: 32 cm From Medial Instep 34.8 cm Ankle Left: Right: Point of Measurement: 11 cm From Medial Instep 23.2 cm Vascular Assessment Pulses: Dorsalis Pedis Palpable: [Left:Yes] Electronic Signature(s) Unsigned Entered By: [name] on  03/25/2022 14:39:30 Signature(s): [name] ([id number] Date(s): [id number].pdf Page 4 of 8 -------------------------------------------------------------------------------- Multi Wound Chart Details Patient Name: Date of Service: [name], [name] 03/25/2022 2:15 PM Medical Record Number: [id number] Patient Account Number: [id number] Date of Birth/Sex: Treating RN: Aug 08, 1931 (89 y.o. [name] Primary Care Neenah Canter: [name] Other Clinician: Referring Aneya Daddona: Treating Katyra Tomassetti/Extender: [name] in Treatment: 11 Vital Signs Height(in): Pulse(bpm): 86 Weight(lbs): 140 Blood Pressure(mmHg): 131/62 Body Mass Index(BMI): Temperature(F): 98.0 Respiratory Rate(breaths/min): 18 [3:Photos:] [N/A:N/A] Left, Lateral Lower Leg N/A N/A Wound Location: Skin T ear/Laceration N/A N/A Wounding Event: Skin T ear N/A N/A Primary Etiology: Hypertension N/A N/A Comorbid History: 01/21/2022 N/A N/A Date Acquired: 9 N/A N/A Weeks of Treatment: Open N/A N/A Wound Status: No N/A N/A Wound Recurrence: 2.2x0.2x0.2 N/A N/A Measurements L x W x D (cm) 0.346 N/A N/A A (cm) : rea 0.069 N/A N/A Volume (cm) : 86.20% N/A N/A % Reduction in A rea: 72.50% N/A N/A % Reduction in Volume: Full Thickness Without Exposed N/A N/A Classification: Support Structures Medium N/A N/A Exudate A mount: Serosanguineous N/A N/A Exudate Type: red, brown N/A N/A Exudate Color: None Present (0%) N/A N/A Granulation Amount: Medium (34-66%) N/A N/A Necrotic Amount: Eschar N/A N/A Necrotic Tissue: Fat Layer (Subcutaneous Tissue): Yes N/A N/A Exposed Structures: Fascia: No Tendon: No Muscle: No Joint: No Bone: No None N/A N/A Epithelialization: Treatment Notes Electronic Signature(s) Unsigned Entered By: [name] on 03/25/2022 14:40:31 Signature(s): [name] ([id number]) Date(s):  [id number].pdf Page 5 of 8 -------------------------------------------------------------------------------- Multi-Disciplinary Care Plan Details Patient Name: Date of Service: [name], [name] 03/25/2022 2:15 PM Medical Record Number: [id number] Patient Account Number: [id number] Date of Birth/Sex: Treating RN: July 09, 1931 (89 y.o. [name] Primary Care Gracelynn Bircher: [name] Other Clinician: Referring Shalise Rosado: Treating Allecia Bells/Extender: [name] in Treatment: 11 Active Inactive Wound/Skin Impairment Nursing Diagnoses: Knowledge deficit related to ulceration/compromised skin integrity Goals: Patient/caregiver will verbalize understanding of skin care regimen Date Initiated: 01/07/2022 Target Resolution Date: 03/05/2022 Goal Status: Active Ulcer/skin breakdown will have a volume reduction of 30% by week 4 Date Initiated: 01/07/2022 Date Inactivated: 02/28/2022 Target Resolution Date: 02/07/2022 Goal Status: Met Ulcer/skin breakdown will have a volume reduction of 50% by week 8 Date Initiated: 01/07/2022 Date Inactivated: 03/14/2022  Target Resolution Date: 03/10/2022 Goal Status: Unmet Unmet Reason: comorbidities Ulcer/skin breakdown will have a volume reduction of 80% by week 12 Date Initiated: 01/07/2022 Target Resolution Date: 04/08/2022 Goal Status: Active Ulcer/skin breakdown will heal within 14 weeks Date Initiated: 01/07/2022 Target Resolution Date: 05/10/2022 Goal Status: Active Interventions: Assess patient/caregiver ability to obtain necessary supplies Assess patient/caregiver ability to perform ulcer/skin care regimen upon admission and as needed Assess ulceration(s) every visit Notes: Electronic Signature(s) Unsigned Entered By: [name] on 03/25/2022 14:53:57 Signature(s): [name] ([id number]) [phone] Date(s): df Page 6 of  8 -------------------------------------------------------------------------------- Pain Assessment Details Patient Name: Date of Service: [name], [name] 03/25/2022 2:15 PM Medical Record Number: [id number] Patient Account Number: [id number] Date of Birth/Sex: Treating RN: 05/25/1931 (89 y.o. [name] Primary Care Conlee Sliter: [name] Other Clinician: Referring Prue Lingenfelter: Treating Mats Jeanlouis/Extender: [name] in Treatment: 11 Active Problems Location of Pain Severity and Description of Pain Patient Has Paino No Site Locations Pain Management and Medication Current Pain Management: Electronic Signature(s) Unsigned Entered By: [name] on 03/25/2022 14:34:21 -------------------------------------------------------------------------------- Patient/Caregiver Education Details Patient Name: Date of Service: [name], [name] 2/19/2024andnbsp2:15 PM Medical Record Number: [id number] Patient Account Number: [id number] Date of Birth/Gender: Treating RN: 1931-06-10 (89 y.o. [name] Primary Care Physician: [name] Other Clinician: Referring Physician: Treating Physician/Extender: [name] in Treatment: 11 Education Assessment Education Provided To: Patient Education Topics Provided Wound/Skin Impairment: Handouts: Caring for Your Ulcer [name], [name] ([id number]) [phone].pdf Page 7 of 8 Methods: Explain/Verbal Responses: State content correctly Electronic Signature(s) Unsigned Entered By: [name] on 03/25/2022 14:54:09 -------------------------------------------------------------------------------- Wound Assessment Details Patient Name: Date of Service: [name], [name] 03/25/2022 2:15 PM Medical Record Number: [id number] Patient Account Number: [id number] Date of Birth/Sex: Treating RN: 11-29-1931 (89 y.o. [name] Primary Care Brittain Smithey: [name]  Other Clinician: Referring Brandice Busser: Treating Adalida Garver/Extender: [name] Weeks in Treatment: 11 Wound Status Wound Number: 3 Primary Etiology: Skin Tear Wound Location: Left, Lateral Lower Leg Wound Status: Open Wounding Event: Skin Tear/Laceration Comorbid History: Hypertension Date Acquired: 01/21/2022 Weeks Of Treatment: 9 Clustered Wound: No Photos Wound Measurements Length: (cm) 2.2 Width: (cm) 0.2 Depth: (cm) 0.2 Area: (cm) 0.346 Volume: (cm) 0.069 % Reduction in Area: 86.2% % Reduction in Volume: 72.5% Epithelialization: None Wound Description Classification: Full Thickness Without Exposed Support Exudate Amount: Medium Exudate Type: Serosanguineous Exudate Color: red, brown Structures Foul Odor After Cleansing: No Slough/Fibrino Yes Wound Bed Granulation Amount: None Present (0%) Exposed Structure Necrotic Amount: Medium (34-66%) Fascia Exposed: No Necrotic Quality: Eschar Fat Layer (Subcutaneous Tissue) Exposed: Yes Tendon Exposed: No Muscle Exposed: No [name], [name] ([id number].pdf Page 8 of 8 Joint Exposed: No Bone Exposed: No Treatment Notes Wound #3 (Lower Leg) Wound Laterality: Left, Lateral Cleanser Peri-Wound Care Topical Primary Dressing Xeroform-HBD 2x2 (in/in) Discharge Instruction: Apply Xeroform-HBD 2x2 (in/in) as directed Secondary Dressing (BORDER) Zetuvit Plus SILICONE BORDER Dressing 4x4 (in/in) Discharge Instruction: Please do not put silicone bordered dressings under wraps. Use non-bordered dressing only. Secured With Compression Wrap Compression Stockings Add-Ons Electronic Signature(s) Unsigned Entered By: [name] on 03/25/2022 14:38:05 -------------------------------------------------------------------------------- Vitals Details Patient Name: Date of Service: [name], [name] 03/25/2022 2:15 PM Medical Record Number: [id number] Patient Account Number:  [id number] Date of Birth/Sex: Treating RN: 1932/01/27 (89 y.o. [name] Primary Care Markeita Alicia: [name] Other Clinician: Referring Tyrome Donatelli: Treating Ayn Domangue/Extender: [name] Weeks in Treatment: 11 Vital Signs Time Taken: 14:32 Temperature (F): 98.0 Weight (lbs):  140 Pulse (bpm): 86 Respiratory Rate (breaths/min): 18 Blood Pressure (mmHg): 131/62 Reference Range: 80 - 120 mg / dl Electronic Signature(s) Unsigned Entered By: [name] on 03/25/2022 14:34:16 Signature(s): Date(s):

## 2022-03-25 NOTE — Progress Notes (Signed)
SPARKLES, MOHRING ([id number]) [id number] Page 1 of 8 Visit Report for 03/25/2022 Chief Complaint Document Details Patient Name: Date of Service: [name], [name] 03/25/2022 2:15 PM Medical Record Number: [id number] Patient Account Number: [id number] Date of Birth/Sex: Treating RN: 03/20/1931 (89 y.o. [name] Primary Care Provider: Jenna Luo Other Clinician: Referring Provider: Treating Provider/Extender: Barbette Merino in Treatment: 11 Information Obtained from: Patient Chief Complaint Right LE Ulcer Electronic Signature(s) Signed: 03/25/2022 2:17:39 PM By: [name] PA-C Entered By: [name] on 03/25/2022 14:17:39 -------------------------------------------------------------------------------- Debridement Details Patient Name: Date of Service: [name] 03/25/2022 2:15 PM Medical Record Number: [id number] Patient Account Number: [id number] Date of Birth/Sex: Treating RN: [birth year] (89 y.o. [name] Primary Care Provider: Jenna Luo Other Clinician: Referring Provider: Treating Provider/Extender: Barbette Merino in Treatment: 11 Debridement Performed for Assessment: Wound #3 Left,Lateral Lower Leg Performed By: Physician Tommie Sams., PA-C Debridement Type: Debridement Level of Consciousness (Pre-procedure): Awake and Alert Pre-procedure Verification/Time Out No Taken: Start Time: 14:48 T Area Debrided (L x W): otal 2.2 (cm) x 0.2 (cm) = 0.44 (cm) Tissue and other material debrided: Slough, Subcutaneous, Biofilm, Slough Level: Skin/Subcutaneous Tissue Debridement Description: Excisional Instrument: Curette Bleeding: Minimum Hemostasis Achieved: Pressure Response to Treatment: Procedure was tolerated well Level of Consciousness (Post- Awake and Alert procedure): Post Debridement Measurements of Total Wound [name], [name] ([id number])  [id number] Page 2 of 8 Length: (cm) 0.9 Width: (cm) 0.5 Depth: (cm) 0.3 Volume: (cm) 0.106 Character of Wound/Ulcer Post Debridement: Stable Post Procedure Diagnosis Same as Pre-procedure Electronic Signature(s) Signed: 03/25/2022 4:31:31 PM By: [name] PA-C Signed: 03/26/2022 4:52:42 PM By: Rosalio Loud MSN RN CNS WTA Entered By: Rosalio Loud on 03/25/2022 14:49:47 -------------------------------------------------------------------------------- HPI Details Patient Name: Date of Service: [name] 03/25/2022 2:15 PM Medical Record Number: [id number] Patient Account Number: [id number] Date of Birth/Sex: Treating RN: May 10, 1931 (89 y.o. [name] Primary Care Provider: Jenna Luo Other Clinician: Referring Provider: Treating Provider/Extender: Barbette Merino in Treatment: 11 History of Present Illness HPI Description: 01-07-2022 upon evaluation today patient appears to be doing poorly in regard to the wound on the right lateral lower extremity. She tells me that this occurred after a fall that she sustained on and around Labor Day weekend. Subsequently following this fall she tells me that she had a wound that really has not improved significantly. She has been seeing her primary care provider and they have been attempting to do what they could to get this improving overall. With that being said the patient has been on Silvadene cream, Neosporin, and more recently soaking with peroxide then wet to dry gauze recommended by primary care provider. Lastly Santyl was advised but they have not been able to get that filled as of yet. Patient does have a history of neuropathy, chronic kidney disease stage III, and hypertension along with chronic venous insufficiency otherwise she is fairly healthy and has nothing else that should affect her ability to heal. 01-14-2022 upon evaluation today patient appears to be doing well  currently in regard to her wound. She has been tolerating the dressing changes without complication. With that being said she still has quite a bit of necrotic tissue buildup on the surface of the wound unfortunately. 01-21-2022 upon evaluation today patient actually is seeming to make excellent progress in regard to her wound. She is tolerating the dressing changes and the Iodoflex seems to be doing a great job at  this point. Fortunately I do not see any signs of active infection at this time. 12/26; wound on the right lateral lower leg which was initially trauma is quite a bit better down to half a centimeter in both dimensions. It has no depth. We are using Iodoflex under 3 layer compression. On the left leg she has a skin tear we have been using Xeroform this also looks quite good. The patient probably has chronic venous insufficiency although she also takes prednisone 10 mg a day adding to the overall fragility of her skin 02-12-2022 upon evaluation today patient appears to be doing well currently in regard to her wound. She has been tolerating the dressing changes without complication. The right leg is looking better the left leg is not feeling quite as much as we would like to see. I think that she has some necrotic tissue that needs to be cleaned away to be honest. Fortunately I do not see any signs of active infection locally nor systemically at this time which is great news. 02-19-2021 upon evaluation today patient's wounds actually are showing signs of improvement which is great news. Fortunately I do not see any signs of active infection locally or systemically which is great news. No fevers, chills, nausea, vomiting, or diarrhea. Upon inspection patient's wound actually is showing signs of improvement although she tells me that the left leg did have more discomfort after last week although did have to perform a fairly significant debridement last week. 02-26-2022 upon evaluation today  patient's wounds actually are showing signs of improvement they are not draining nearly as much but this means they also have become a little bit more dry in general and therefore the dressing material was sticking unfortunately. I do think that the collagen is done well but I believe on the right leg this is doing so well that possibly the switch of the Xeroform could be helpful on the left leg I think the collagen is still the better way to go to be honest but we may need to do something to try to keep this from drying out. 03-05-2022 upon evaluation today patient appears to be doing well currently in regard to her wound. She has been tolerating the dressing changes without complication. Fortunately there does not appear to be any signs of infection locally nor systemically the right leg looks healed the left leg looks better. 03-14-2022 upon evaluation today patient appears to be doing pretty well currently in regard to her wound. This in fact is showing signs of improvement the right leg is still close the left leg is looking better in fact there is not even a need for sharp debridement today which is good news as well she really did not want me to perform any debridement whatsoever. She already said that before even got a chance to look at the wound. 03-25-2022 upon evaluation today patient appears to be doing much better in regard to her leg ulcer. She is actually been tolerating the dressing changes without MIKALYNN, LONGTINE ([id number]) [id number] Page 3 of 8 complication and overall I am extremely pleased with where we stand today. Electronic Signature(s) Signed: 03/25/2022 2:51:35 PM By: [name] PA-C Entered By: [name] on 03/25/2022 14:51:35 -------------------------------------------------------------------------------- Physical Exam Details Patient Name: Date of Service: [name], [name] 03/25/2022 2:15 PM Medical Record Number:  [id number] Patient Account Number: [id number] Date of Birth/Sex: Treating RN: 07-Nov-1931 (89 y.o. [name] Primary Care Provider: Jenna Luo Other Clinician: Referring Provider: Treating  Provider/Extender: Veronia Beets Weeks in Treatment: 15 Constitutional Well-nourished and well-hydrated in no acute distress. Respiratory normal breathing without difficulty. Psychiatric this patient is able to make decisions and demonstrates good insight into disease process. Alert and Oriented x 3. pleasant and cooperative. Notes Patient's wound is showing signs of excellent granulation and epithelization at this point. There was some slough buildup I was able to clear this away and once cleared away this actually appears to be doing significantly better which is great news. There is no signs of infection at this point. In fact it looks smaller than it did prior to cleaning due to the fact that some of the slough buildup was over top of good skin that it healed underneath. Electronic Signature(s) Signed: 03/25/2022 2:52:02 PM By: [name] PA-C Entered By: [name] on 03/25/2022 14:52:02 -------------------------------------------------------------------------------- Physician Orders Details Patient Name: Date of Service: [name], [name] 03/25/2022 2:15 PM Medical Record Number: [id number] Patient Account Number: [id number] Date of Birth/Sex: Treating RN: 02/08/1931 (89 y.o. [name] Primary Care Provider: Jenna Luo Other Clinician: Referring Provider: Treating Provider/Extender: Barbette Merino in Treatment: 11 Verbal / Phone Orders: No Diagnosis Coding ICD-10 Coding [name], [name] ([id number]) [id number] Page 4 of 8 Code Description I87.331 Chronic venous hypertension (idiopathic) with ulcer and inflammation of right lower extremity G60.3 Idiopathic progressive neuropathy L97.812  Non-pressure chronic ulcer of other part of right lower leg with fat layer exposed L97.822 Non-pressure chronic ulcer of other part of left lower leg with fat layer exposed N18.30 Chronic kidney disease, stage 3 unspecified I10 Essential (primary) hypertension Follow-up Appointments Return Appointment in 2 weeks. Bathing/ Shower/ Hygiene May shower with wound dressing protected with water repellent cover or cast protector. No tub bath. Anesthetic (Use 'Patient Medications' Section for Anesthetic Order Entry) Lidocaine applied to wound bed Edema Control - Lymphedema / Segmental Compressive Device / Other Elevate, Exercise Daily and A void Standing for Long Periods of Time. Elevate legs to the level of the heart and pump ankles as often as possible Elevate leg(s) parallel to the floor when sitting. Wound Treatment Wound #3 - Lower Leg Wound Laterality: Left, Lateral Prim Dressing: Xeroform-HBD 2x2 (in/in) 3 x Per Week/30 Days ary Discharge Instructions: Apply Xeroform-HBD 2x2 (in/in) as directed Secondary Dressing: (BORDER) Zetuvit Plus SILICONE BORDER Dressing 4x4 (in/in) 3 x Per Week/30 Days Discharge Instructions: Please do not put silicone bordered dressings under wraps. Use non-bordered dressing only. Electronic Signature(s) Signed: 03/25/2022 4:31:31 PM By: [name] PA-C Signed: 03/26/2022 4:52:42 PM By: Rosalio Loud MSN RN CNS WTA Entered By: Rosalio Loud on 03/25/2022 14:53:33 -------------------------------------------------------------------------------- Problem List Details Patient Name: Date of Service: [name] 03/25/2022 2:15 PM Medical Record Number: [id number] Patient Account Number: [id number] Date of Birth/Sex: Treating RN: 02-22-1931 (89 y.o. [name] Primary Care Provider: Jenna Luo Other Clinician: Referring Provider: Treating Provider/Extender: Veronia Beets Weeks in Treatment: 11 Active  Problems ICD-10 Encounter Code Description Active Date MDM Diagnosis I87.331 Chronic venous hypertension (idiopathic) with ulcer and inflammation of right 01/07/2022 No Yes lower extremity G60.3 Idiopathic progressive neuropathy 01/07/2022 No Yes [name], [name] ([id number]) [id number] Page 5 of 8 [phone] Non-pressure chronic ulcer of other part of right lower leg with fat layer 01/07/2022 No Yes exposed L97.822 Non-pressure chronic ulcer of other part of left lower leg with fat layer exposed1/23/2024 No Yes N18.30 Chronic kidney disease, stage 3 unspecified 01/07/2022 No Yes I10 Essential (primary) hypertension 01/07/2022 No Yes  Inactive Problems Resolved Problems Electronic Signature(s) Signed: 03/25/2022 4:31:31 PM By: [name] PA-C Signed: 03/26/2022 4:52:42 PM By: Rosalio Loud MSN RN CNS WTA Previous Signature: 03/25/2022 2:17:36 PM Version By: [name] PA-C Entered By: Rosalio Loud on 03/25/2022 14:54:18 -------------------------------------------------------------------------------- Progress Note Details Patient Name: Date of Service: [name] 03/25/2022 2:15 PM Medical Record Number: [id number] Patient Account Number: [id number] Date of Birth/Sex: Treating RN: Jan 06, 1932 (89 y.o. [name] Primary Care Provider: Jenna Luo Other Clinician: Referring Provider: Treating Provider/Extender: Barbette Merino in Treatment: 11 Subjective Chief Complaint Information obtained from Patient Right LE Ulcer History of Present Illness (HPI) 01-07-2022 upon evaluation today patient appears to be doing poorly in regard to the wound on the right lateral lower extremity. She tells me that this occurred after a fall that she sustained on and around Labor Day weekend. Subsequently following this fall she tells me that she had a wound that really has not improved significantly. She has been seeing her primary care  provider and they have been attempting to do what they could to get this improving overall. With that being said the patient has been on Silvadene cream, Neosporin, and more recently soaking with peroxide then wet to dry gauze recommended by primary care provider. Lastly Santyl was advised but they have not been able to get that filled as of yet. Patient does have a history of neuropathy, chronic kidney disease stage III, and hypertension along with chronic venous insufficiency otherwise she is fairly healthy and has nothing else that should affect her ability to heal. 01-14-2022 upon evaluation today patient appears to be doing well currently in regard to her wound. She has been tolerating the dressing changes without complication. With that being said she still has quite a bit of necrotic tissue buildup on the surface of the wound unfortunately. 01-21-2022 upon evaluation today patient actually is seeming to make excellent progress in regard to her wound. She is tolerating the dressing changes and the Iodoflex seems to be doing a great job at this point. Fortunately I do not see any signs of active infection at this time. 12/26; wound on the right lateral lower leg which was initially trauma is quite a bit better down to half a centimeter in both dimensions. It has no depth. We are using Iodoflex under 3 layer compression. On the left leg she has a skin tear we have been using Xeroform this also looks quite good. The patient probably has chronic venous insufficiency although she also takes prednisone 10 mg a day adding to the overall fragility of her skin 02-12-2022 upon evaluation today patient appears to be doing well currently in regard to her wound. She has been tolerating the dressing changes without [name], [name] ([id number]) [id number] Page 6 of 8 complication. The right leg is looking better the left leg is not feeling quite as much as we would like to see. I  think that she has some necrotic tissue that needs to be cleaned away to be honest. Fortunately I do not see any signs of active infection locally nor systemically at this time which is great news. 02-19-2021 upon evaluation today patient's wounds actually are showing signs of improvement which is great news. Fortunately I do not see any signs of active infection locally or systemically which is great news. No fevers, chills, nausea, vomiting, or diarrhea. Upon inspection patient's wound actually is showing signs of improvement although she tells me that the left leg did  have more discomfort after last week although did have to perform a fairly significant debridement last week. 02-26-2022 upon evaluation today patient's wounds actually are showing signs of improvement they are not draining nearly as much but this means they also have become a little bit more dry in general and therefore the dressing material was sticking unfortunately. I do think that the collagen is done well but I believe on the right leg this is doing so well that possibly the switch of the Xeroform could be helpful on the left leg I think the collagen is still the better way to go to be honest but we may need to do something to try to keep this from drying out. 03-05-2022 upon evaluation today patient appears to be doing well currently in regard to her wound. She has been tolerating the dressing changes without complication. Fortunately there does not appear to be any signs of infection locally nor systemically the right leg looks healed the left leg looks better. 03-14-2022 upon evaluation today patient appears to be doing pretty well currently in regard to her wound. This in fact is showing signs of improvement the right leg is still close the left leg is looking better in fact there is not even a need for sharp debridement today which is good news as well she really did not want me to perform any debridement whatsoever. She already  said that before even got a chance to look at the wound. 03-25-2022 upon evaluation today patient appears to be doing much better in regard to her leg ulcer. She is actually been tolerating the dressing changes without complication and overall I am extremely pleased with where we stand today. Objective Constitutional Well-nourished and well-hydrated in no acute distress. Vitals Time Taken: 2:32 PM, Weight: 140 lbs, Temperature: 98.0 F, Pulse: 86 bpm, Respiratory Rate: 18 breaths/min, Blood Pressure: 131/62 mmHg. Respiratory normal breathing without difficulty. Psychiatric this patient is able to make decisions and demonstrates good insight into disease process. Alert and Oriented x 3. pleasant and cooperative. General Notes: Patient's wound is showing signs of excellent granulation and epithelization at this point. There was some slough buildup I was able to clear this away and once cleared away this actually appears to be doing significantly better which is great news. There is no signs of infection at this point. In fact it looks smaller than it did prior to cleaning due to the fact that some of the slough buildup was over top of good skin that it healed underneath. Integumentary (Hair, Skin) Wound #3 status is Open. Original cause of wound was Skin T ear/Laceration. The date acquired was: 01/21/2022. The wound has been in treatment 9 weeks. The wound is located on the Left,Lateral Lower Leg. The wound measures 2.2cm length x 0.2cm width x 0.2cm depth; 0.346cm^2 area and 0.069cm^3 volume. There is Fat Layer (Subcutaneous Tissue) exposed. There is a medium amount of serosanguineous drainage noted. There is no granulation within the wound bed. There is a medium (34-66%) amount of necrotic tissue within the wound bed including Eschar. Assessment Active Problems ICD-10 Chronic venous hypertension (idiopathic) with ulcer and inflammation of right lower extremity Idiopathic progressive  neuropathy Non-pressure chronic ulcer of other part of right lower leg with fat layer exposed Non-pressure chronic ulcer of other part of left lower leg with fat layer exposed Chronic kidney disease, stage 3 unspecified Essential (primary) hypertension Procedures Wound #3 Pre-procedure diagnosis of Wound #3 is a Skin T located on the Left,Lateral Lower Leg .  There was a Excisional Skin/Subcutaneous Tissue Debridement with ear a total area of 0.44 sq cm performed by Tommie Sams., PA-C. With the following instrument(s): Curette Material removed includes Subcutaneous Tissue, Slough, and Biofilm. No specimens were taken.A Minimum amount of bleeding was controlled with Pressure. The procedure was tolerated well. Post Debridement Measurements: 0.9cm length x 0.5cm width x 0.3cm depth; 0.106cm^3 volume. Character of Wound/Ulcer Post Debridement is stable. Post procedure Diagnosis Wound #3: Same as Pre-Procedure [name], [name] ([id number]) [id number] Page 7 of 8 Plan Follow-up Appointments: Return Appointment in 1 week. Bathing/ Shower/ Hygiene: May shower with wound dressing protected with water repellent cover or cast protector. No tub bath. Anesthetic (Use 'Patient Medications' Section for Anesthetic Order Entry): Lidocaine applied to wound bed Edema Control - Lymphedema / Segmental Compressive Device / Other: Tubigrip single layer applied. - size D left leg Elevate, Exercise Daily and Avoid Standing for Long Periods of Time. Elevate legs to the level of the heart and pump ankles as often as possible Elevate leg(s) parallel to the floor when sitting. WOUND #3: - Lower Leg Wound Laterality: Left, Lateral Prim Dressing: Xeroform-HBD 2x2 (in/in) 3 x Per Week/30 Days ary Discharge Instructions: Apply Xeroform-HBD 2x2 (in/in) as directed Secondary Dressing: (BORDER) Zetuvit Plus SILICONE BORDER Dressing 4x4 (in/in) 3 x Per Week/30 Days Discharge Instructions:  Please do not put silicone bordered dressings under wraps. Use non-bordered dressing only. Secured With: Tubigrip Size D, 3x10 (in/yd) 3 x Per Week/30 Days Discharge Instructions: single layer 1. I would recommend currently that we have the patient continue to monitor for any signs of infection or worsening. Will continue with the Xeroform which has done excellent for up to this point. 2. I am also can recommend that we have the patient continue with the bordered foam dressing to cover followed by the Tubigrip size D which is doing great. She is actually wearing her compression socks which I think is absolutely fine as well. We will see patient back for reevaluation in 1 week here in the clinic. If anything worsens or changes patient will contact our office for additional recommendations. Electronic Signature(s) Signed: 03/25/2022 2:52:27 PM By: [name] PA-C Entered By: [name] on 03/25/2022 14:52:27 -------------------------------------------------------------------------------- SuperBill Details Patient Name: Date of Service: [name], [name] 03/25/2022 Medical Record Number: [id number] Patient Account Number: [id number] Date of Birth/Sex: Treating RN: Nov 12, 1931 (89 y.o. [name] Primary Care Provider: Jenna Luo Other Clinician: Referring Provider: Treating Provider/Extender: Barbette Merino in Treatment: 11 Diagnosis Coding ICD-10 Codes Code Description [phone] Chronic venous hypertension (idiopathic) with ulcer and inflammation of right lower extremity G60.3 Idiopathic progressive neuropathy L97.812 Non-pressure chronic ulcer of other part of right lower leg with fat layer exposed L97.822 Non-pressure chronic ulcer of other part of left lower leg with fat layer exposed N18.30 Chronic kidney disease, stage 3 unspecified I10 Essential (primary) hypertension Facility Procedures : Marcelina Morel Code: [id number] R, Diamante L ([id number]  IC Description: 11042 - DEB SUBQ TISSUE 20 SQ CM/< 8) [phone] D-10 Diagnosis Description L97.822 Non-pressure chronic ulcer of other part of left lower leg with fat layer exposed Modifier: [id number] P Quantity: 1 age 49 of 10 Physician Procedures : CPT4 Code Description Modifier [id number] - WC PHYS SUBQ TISS 20 SQ CM ICD-10 Diagnosis Description L97.822 Non-pressure chronic ulcer of other part of left lower leg with fat layer exposed Quantity: 1 Electronic Signature(s) Signed: 03/25/2022 4:31:31 PM By: [name] PA-C Signed: 03/26/2022 4:52:42 PM By:  Rosalio Loud MSN RN CNS WTA Previous Signature: 03/25/2022 2:52:43 PM Version By: [name] PA-C Entered By: Rosalio Loud on 03/25/2022 14:53:49

## 2022-04-08 ENCOUNTER — Telehealth: Payer: Self-pay | Admitting: Family Medicine

## 2022-04-08 ENCOUNTER — Encounter: Payer: 59 | Attending: Internal Medicine | Admitting: Internal Medicine

## 2022-04-08 DIAGNOSIS — L97812 Non-pressure chronic ulcer of other part of right lower leg with fat layer exposed: Secondary | ICD-10-CM | POA: Diagnosis not present

## 2022-04-08 DIAGNOSIS — G603 Idiopathic progressive neuropathy: Secondary | ICD-10-CM | POA: Insufficient documentation

## 2022-04-08 DIAGNOSIS — I87331 Chronic venous hypertension (idiopathic) with ulcer and inflammation of right lower extremity: Secondary | ICD-10-CM | POA: Diagnosis present

## 2022-04-08 DIAGNOSIS — I872 Venous insufficiency (chronic) (peripheral): Secondary | ICD-10-CM | POA: Insufficient documentation

## 2022-04-08 DIAGNOSIS — I129 Hypertensive chronic kidney disease with stage 1 through stage 4 chronic kidney disease, or unspecified chronic kidney disease: Secondary | ICD-10-CM | POA: Diagnosis not present

## 2022-04-08 DIAGNOSIS — N183 Chronic kidney disease, stage 3 unspecified: Secondary | ICD-10-CM | POA: Diagnosis not present

## 2022-04-08 DIAGNOSIS — L97822 Non-pressure chronic ulcer of other part of left lower leg with fat layer exposed: Secondary | ICD-10-CM | POA: Insufficient documentation

## 2022-04-08 NOTE — Telephone Encounter (Signed)
Contacted Michell Heinrich Arps to schedule their annual wellness visit. Appointment made for 04/09/2022.  Thank you,  Colletta [place],  [facility] Direct Dial ??[id number]

## 2022-04-09 ENCOUNTER — Ambulatory Visit (INDEPENDENT_AMBULATORY_CARE_PROVIDER_SITE_OTHER): Payer: 59

## 2022-04-09 VITALS — Ht 63.0 in | Wt 135.0 lb

## 2022-04-09 DIAGNOSIS — Z0000 Encounter for general adult medical examination without abnormal findings: Secondary | ICD-10-CM | POA: Diagnosis not present

## 2022-04-09 NOTE — Progress Notes (Signed)
Subjective:   [name] is a 89 y.o. female who presents for Medicare Annual (Subsequent) preventive examination. I connected with  [name] on 04/09/22 by a audio enabled telemedicine application and verified that I am speaking with the correct person using two identifiers.  Patient Location: Home  Provider Location: Home Office  I discussed the limitations of evaluation and management by telemedicine. The patient expressed understanding and agreed to proceed.  Review of Systems     Cardiac Risk Factors include: advanced age (>106mn, >>92women);hypertension;dyslipidemia     Objective:    Today's Vitals   04/09/22 0938  Weight: 135 lb (61.2 kg)  Height: '5\' 3"'$  (1.6 m)   Body mass index is 23.91 kg/m.     04/09/2022    9:41 AM 03/16/2021    9:18 AM  Advanced Directives  Does Patient Have a Medical Advance Directive? Yes Yes  Type of [place]amed[place]  Copy of [place] Chart? No - copy requested No - copy requested    Current Medications (verified) Outpatient Encounter Medications as of 04/09/2022  Medication Sig   ALPRAZolam (XANAX) 0.5 MG tablet Take 1 tablet (0.5 mg total) by mouth 3 (three) times daily as needed. for anxiety   amLODipine (NORVASC) 5 MG tablet Take 1 tablet (5 mg total) by mouth daily.   collagenase (SANTYL) 250 UNIT/GM ointment Apply 1 Application topically daily.   denosumab (PROLIA) 60 MG/ML SOSY injection Inject 60 mg into the skin every 6 (six) months.   diclofenac Sodium (VOLTAREN) 1 % GEL Apply topically 4 (four) times daily.   escitalopram (LEXAPRO) 10 MG tablet Take 1 tablet (10 mg total) by mouth daily.   losartan (COZAAR) 50 MG tablet Take 1 tablet (50 mg total) by mouth daily.   predniSONE (DELTASONE) 10 MG tablet Take 1 tablet (10 mg total) by mouth daily with breakfast.   traMADol (ULTRAM) 50 MG tablet Take 1 tablet (50 mg  total) by mouth every 6 (six) hours as needed.   No facility-administered encounter medications on file as of 04/09/2022.    Allergies (verified) Pneumococcal vaccines   History: Past Medical History:  Diagnosis Date   Cataract    CKD (chronic kidney disease) stage 3, GFR 30-59 ml/min (HCC)    Fatty liver disease, nonalcoholic    [id number] admitted with encephalopathy unsure of cause, resolved sponatneously   Hypertension    Past Surgical History:  Procedure Laterality Date   FRACTURE SURGERY     right shoulder, both wrists    JOINT REPLACEMENT     HIP- bilateral   Family History  Problem Relation Age of Onset   Heart disease Mother    Cancer Father        metastatic unsure of primary   Early death Brother        died at 469y/o   Liver cancer Maternal Grandmother    Healthy Son    Social History   Socioeconomic History   Marital status: Widowed    Spouse name: Not on file   Number of children: 1   Years of education: Not on file   Highest education level: Not on file  Occupational History   Not on file  Tobacco Use   Smoking status: Never   Smokeless tobacco: Never  Vaping Use   Vaping Use: Never used  Substance and Sexual Activity   Alcohol use: Not Currently  Drug use: Never   Sexual activity: Not Currently    Comment: raised tobacco, retired  Other Topics Concern   Not on file  Social History Narrative   Lives alone   Right handed    Caffeine: 1/2-1 cup coffee in AM   Social Determinants of Health   Financial Resource Strain: Low Risk  (04/09/2022)   Overall Financial Resource Strain (CARDIA)    Difficulty of Paying Living Expenses: Not hard at all  Food Insecurity: No Food Insecurity (04/09/2022)   Hunger Vital Sign    Worried About Running Out of Food in the Last Year: Never true    Ran Out of Food in the Last Year: Never true  Transportation Needs: No Transportation Needs (04/09/2022)   PRAPARE - Hydrologist (Medical): No     Lack of Transportation (Non-Medical): No  Physical Activity: Inactive (04/09/2022)   Exercise Vital Sign    Days of Exercise per Week: 0 days    Minutes of Exercise per Session: 0 min  Stress: No Stress Concern Present (04/09/2022)   [place]    Feeling of Stress : Not at all  Social Connections: Moderately Isolated (04/09/2022)   Social Connection and Isolation Panel [NHANES]    Frequency of Communication with Friends and Family: More than three times a week    Frequency of Social Gatherings with Friends and Family: More than three times a week    Attends Religious Services: 1 to 4 times per year    Active Member of [employer] or Organizations: No    Attends Archivist Meetings: Never    Marital Status: Widowed    Tobacco Counseling Counseling given: Not Answered   Clinical Intake:  Pre-visit preparation completed: Yes  Pain : No/denies pain     Nutritional Risks: None Diabetes: No  How often do you need to have someone help you when you read instructions, pamphlets, or other written materials from your doctor or pharmacy?: 1 - Never  Diabetic?no   Interpreter Needed?: No  Information entered by :: Jadene Pierini, LPN   Activities of Daily Living    04/09/2022    9:42 AM  In your present state of health, do you have any difficulty performing the following activities:  Hearing? 0  Vision? 0  Difficulty concentrating or making decisions? 0  Walking or climbing stairs? 0  Dressing or bathing? 0  Doing errands, shopping? 0  Preparing Food and eating ? N  Using the Toilet? N  In the past six months, have you accidently leaked urine? N  Do you have problems with loss of bowel control? N  Managing your Medications? N  Managing your Finances? N  Housekeeping or managing your Housekeeping? N    Patient Care Team: Susy Frizzle, MD as PCP - General (Family Medicine) Edythe Clarity, [facility] as  Pharmacist (Pharmacist)  Indicate any recent Medical Services you may have received from other than [facility] providers in the past year (date may be approximate).     Assessment:   This is a routine wellness examination for [name].  Hearing/Vision screen Vision Screening - Comments:: Wears rx glasses - up to date with routine eye exams with  Dr.Bell  Dietary issues and exercise activities discussed: Current Exercise Habits: The patient does not participate in regular exercise at present, Exercise limited by: orthopedic condition(s)   Goals Addressed  This Visit's Progress    DIET - INCREASE WATER INTAKE         Depression Screen    04/09/2022    9:40 AM 02/25/2022    3:37 PM 11/22/2021    2:10 PM 11/12/2021    2:27 PM 10/01/2021    2:41 PM 03/16/2021    9:15 AM 11/08/2019   10:58 AM  PHQ 2/9 Scores  PHQ - 2 Score 0 0 0 0 0 0 0  PHQ- 9 Score   0 0       Fall Risk    04/09/2022    9:39 AM 02/25/2022    3:37 PM 11/22/2021    2:10 PM 11/12/2021    2:27 PM 10/19/2021   12:20 PM  Fall Risk   Falls in the past year? '1 1 1 '$ 0 1  Number falls in past yr: '1 1 1  '$ 0  Injury with Fall? '1 1 1  1  '$ Risk for fall due to : History of fall(s);Impaired balance/gait;Orthopedic patient History of fall(s);Impaired balance/gait;Orthopedic patient History of fall(s);Impaired balance/gait;Orthopedic patient    Follow up Education provided;Falls prevention discussed Education provided;Falls prevention discussed Education provided;Falls prevention discussed      FALL RISK PREVENTION PERTAINING TO THE HOME:  Any stairs in or around the home? No  If so, are there any without handrails? No  Home free of loose throw rugs in walkways, pet beds, electrical cords, etc? Yes  Adequate lighting in your home to reduce risk of falls? Yes   ASSISTIVE DEVICES UTILIZED TO PREVENT FALLS:  Life alert? No  Use of a cane, walker or w/c? Yes  Grab bars in the bathroom? Yes  Shower chair or bench in  shower? Yes  Elevated toilet seat or a handicapped toilet? No          04/09/2022    9:42 AM 03/16/2021    9:20 AM  6CIT Screen  What Year? 0 points 0 points  What month? 0 points 0 points  What time? 0 points 0 points  Count back from 20 0 points 2 points  Months in reverse 0 points 0 points  Repeat phrase 0 points 0 points  Total Score 0 points 2 points    Immunizations Immunization History  Administered Date(s) Administered   Fluad Quad(high Dose 65+) 12/01/2018, 11/08/2019, 12/18/2020, 11/20/2021   Influenza, High Dose Seasonal PF 11/07/2016, 12/18/2017   Influenza,inj,Quad PF,6+ Mos 12/27/2015   PFIZER(Purple Top)SARS-COV-2 Vaccination 02/26/2019, 03/19/2019, 11/18/2019   Pneumococcal Polysaccharide-23 07/12/2014   Tdap 10/25/2021    TDAP status: Up to date  Flu Vaccine status: Up to date  Pneumococcal vaccine status: Up to date  Covid-19 vaccine status: Completed vaccines  Qualifies for Shingles Vaccine? Yes   Zostavax completed No   Shingrix Completed?: No.    Education has been provided regarding the importance of this vaccine. Patient has been advised to call insurance company to determine out of pocket expense if they have not yet received this vaccine. Advised may also receive vaccine at local pharmacy or Health Dept. Verbalized acceptance and understanding.  Screening Tests Health Maintenance  Topic Date Due   Zoster Vaccines- Shingrix (1 of 2) Never done   COVID-19 Vaccine (4 - 2023-24 season) 06/13/2022 (Originally 10/05/2021)   Pneumonia Vaccine 7+ Years old (2 of 2 - PCV) 10/25/2024 (Originally 07/12/2015)   Medicare Annual Wellness (AWV)  04/09/2023   DTaP/Tdap/Td (2 - Td or Tdap) 10/26/2031   INFLUENZA VACCINE  Completed   DEXA SCAN  Completed   HPV VACCINES  Aged Out    Health Maintenance  Health Maintenance Due  Topic Date Due   Zoster Vaccines- Shingrix (1 of 2) Never done    Colorectal cancer screening: No longer required.   Mammogram  status: No longer required due to age.  Bone Density status: Completed 04/08/2019. Results reflect: Bone density results: OSTEOPENIA. Repeat every 5 years.  Lung Cancer Screening: (Low Dose CT Chest recommended if Age 62-80 years, 30 pack-year currently smoking OR have quit w/in 15years.) does not qualify.   Lung Cancer Screening Referral: n/a  Additional Screening:  Hepatitis C Screening: does not qualify;   Vision Screening: Recommended annual ophthalmology exams for early detection of glaucoma and other disorders of the eye. Is the patient up to date with their annual eye exam?  Yes  Who is the provider or what is the name of the office in which the patient attends annual eye exams? Dr.Bell  If pt is not established with a provider, would they like to be referred to a provider to establish care? No .   Dental Screening: Recommended annual dental exams for proper oral hygiene  Community Resource Referral / Chronic Care Management: CRR required this visit?  No   CCM required this visit?  No      Plan:     I have personally reviewed and noted the following in the patient's chart:   Medical and social history Use of alcohol, tobacco or illicit drugs  Current medications and supplements including opioid prescriptions. Patient is currently taking opioid prescriptions. Information provided to patient regarding non-opioid alternatives. Patient advised to discuss non-opioid treatment plan with their provider. Functional ability and status Nutritional status Physical activity Advanced directives List of other physicians Hospitalizations, surgeries, and ER visits in previous 12 months Vitals Screenings to include cognitive, depression, and falls Referrals and appointments  In addition, I have reviewed and discussed with patient certain preventive protocols, quality metrics, and best practice recommendations. A written personalized care plan for preventive services as well as  general preventive health recommendations were provided to patient.     Daphane Shepherd, LPN   [phone]   Nurse Notes: Lives alone , difficulty with ambulation

## 2022-04-09 NOTE — Patient Instructions (Signed)
[name] , Thank you for taking time to come for your Medicare Wellness Visit. I appreciate your ongoing commitment to your health goals. Please review the following plan we discussed and let me know if I can assist you in the future.   These are the goals we discussed:  Goals      DIET - INCREASE WATER INTAKE     Exercise 3x per week (30 min per time)     Try Chair exercises and gentle stretching to increase mobility.     Pharmacy Care Plan:     CARE PLAN ENTRY  Current Barriers:  Chronic Disease Management support, education, and care coordination needs related to Hypertension and Chronic Kidney Disease   Hypertension BP Readings from Last 3 Encounters:  07/01/19 140/60  06/23/19 (!) 146/72  06/15/19 130/74  Pharmacist Clinical Goal(s): Over the next 120 days, patient will work with PharmD and providers to maintain BP goal <140/90 Current regimen:  Amlodipine '5mg'$  daily Interventions: Comprehensive medication review Continue current therapy Patient self care activities - Over the next 120 days, patient will: Check BP if symptomatic, document, and provide at future appointments Ensure daily salt intake < 2300 mg/day  CKD Pharmacist Clinical Goal(s) Over the next 120 days, patient will work with PharmD and providers to optimize medication regimen as related to current kidney function Interventions: Evaluated safety of all medications based off current renal function Counseled on avoidance of NSAIDs Encouraged her to continue to use lowest effective dose of prednisone possible Patient self care activities - Over the next 120 days, patient will: Continue to take all medications as directed Contact PharmD or PCP with any medication related questions or concerns   Please see past updates related to this goal by clicking on the "Past Updates" button in the selected goal       Track and Manage My Blood Pressure-Hypertension     Timeframe:  Long-Range Goal Priority:   High Start Date:     10/05/20                        Expected End Date:  04/04/21                     Follow Up Date 02/02/21    - check blood pressure weekly - choose a place to take my blood pressure (home, clinic or office, retail store) - write blood pressure results in a log or diary    Why is this important?   You won't feel high blood pressure, but it can still hurt your blood vessels.  High blood pressure can cause heart or kidney problems. It can also cause a stroke.  Making lifestyle changes like losing a little weight or eating less salt will help.  Checking your blood pressure at home and at different times of the day can help to control blood pressure.  If the doctor prescribes medicine remember to take it the way the doctor ordered.  Call the office if you cannot afford the medicine or if there are questions about it.     Notes:         This is a list of the screening recommended for you and due dates:  Health Maintenance  Topic Date Due   Zoster (Shingles) Vaccine (1 of 2) Never done   COVID-19 Vaccine (4 - 2023-24 season) 06/13/2022*   Pneumonia Vaccine (2 of 2 - PCV) 10/25/2024*   Medicare Annual Wellness Visit  04/09/2023   DTaP/Tdap/Td vaccine (2 - Td or Tdap) 10/26/2031   Flu Shot  Completed   DEXA scan (bone density measurement)  Completed   HPV Vaccine  Aged Out  *Topic was postponed. The date shown is not the original due date.    Advanced directives: Please bring a copy of your health care power of attorney and living will to the office to be added to your chart at your convenience.   Conditions/risks identified: Aim for 30 minutes of exercise or brisk walking, 6-8 glasses of water, and 5 servings of fruits and vegetables each day.   Next appointment: Follow up in one year for your annual wellness visit    Preventive Care 65 Years and Older, Female Preventive care refers to lifestyle choices and visits with your health care provider that can  promote health and wellness. What does preventive care include? A yearly physical exam. This is also called an annual well check. Dental exams once or twice a year. Routine eye exams. Ask your health care provider how often you should have your eyes checked. Personal lifestyle choices, including: Daily care of your teeth and gums. Regular physical activity. Eating a healthy diet. Avoiding tobacco and drug use. Limiting alcohol use. Practicing safe sex. Taking low-dose aspirin every day. Taking vitamin and mineral supplements as recommended by your health care provider. What happens during an annual well check? The services and screenings done by your health care provider during your annual well check will depend on your age, overall health, lifestyle risk factors, and family history of disease. Counseling  Your health care provider may ask you questions about your: Alcohol use. Tobacco use. Drug use. Emotional well-being. Home and relationship well-being. Sexual activity. Eating habits. History of falls. Memory and ability to understand (cognition). Work and work Statistician. Reproductive health. Screening  You may have the following tests or measurements: Height, weight, and BMI. Blood pressure. Lipid and cholesterol levels. These may be checked every 5 years, or more frequently if you are over 87 years old. Skin check. Lung cancer screening. You may have this screening every year starting at age 87 if you have a 30-pack-year history of smoking and currently smoke or have quit within the past 15 years. Fecal occult blood test (FOBT) of the stool. You may have this test every year starting at age 87. Flexible sigmoidoscopy or colonoscopy. You may have a sigmoidoscopy every 5 years or a colonoscopy every 10 years starting at age 87. Hepatitis C blood test. Hepatitis B blood test. Sexually transmitted disease (STD) testing. Diabetes screening. This is done by checking your  blood sugar (glucose) after you have not eaten for a while (fasting). You may have this done every 1-3 years. Bone density scan. This is done to screen for osteoporosis. You may have this done starting at age 87. Mammogram. This may be done every 1-2 years. Talk to your health care provider about how often you should have regular mammograms. Talk with your health care provider about your test results, treatment options, and if necessary, the need for more tests. Vaccines  Your health care provider may recommend certain vaccines, such as: Influenza vaccine. This is recommended every year. Tetanus, diphtheria, and acellular pertussis (Tdap, Td) vaccine. You may need a Td booster every 10 years. Zoster vaccine. You may need this after age 20. Pneumococcal 13-valent conjugate (PCV13) vaccine. One dose is recommended after age 33. Pneumococcal polysaccharide (PPSV23) vaccine. One dose is recommended after age 80. Talk to your  health care provider about which screenings and vaccines you need and how often you need them. This information is not intended to replace advice given to you by your health care provider. Make sure you discuss any questions you have with your health care provider. Document Released: 02/17/2015 Document Revised: 10/11/2015 Document Reviewed: 11/22/2014 Elsevier Interactive Patient Education  2017 [place] Prevention in the Home Falls can cause injuries. They can happen to people of all ages. There are many things you can do to make your home safe and to help prevent falls. What can I do on the outside of my home? Regularly fix the edges of walkways and driveways and fix any cracks. Remove anything that might make you trip as you walk through a door, such as a raised step or threshold. Trim any bushes or trees on the path to your home. Use bright outdoor lighting. Clear any walking paths of anything that might make someone trip, such as rocks or tools. Regularly  check to see if handrails are loose or broken. Make sure that both sides of any steps have handrails. Any raised decks and porches should have guardrails on the edges. Have any leaves, snow, or ice cleared regularly. Use sand or salt on walking paths during winter. Clean up any spills in your garage right away. This includes oil or grease spills. What can I do in the bathroom? Use night lights. Install grab bars by the toilet and in the tub and shower. Do not use towel bars as grab bars. Use non-skid mats or decals in the tub or shower. If you need to sit down in the shower, use a plastic, non-slip stool. Keep the floor dry. Clean up any water that spills on the floor as soon as it happens. Remove soap buildup in the tub or shower regularly. Attach bath mats securely with double-sided non-slip rug tape. Do not have throw rugs and other things on the floor that can make you trip. What can I do in the bedroom? Use night lights. Make sure that you have a light by your bed that is easy to reach. Do not use any sheets or blankets that are too big for your bed. They should not hang down onto the floor. Have a firm chair that has side arms. You can use this for support while you get dressed. Do not have throw rugs and other things on the floor that can make you trip. What can I do in the kitchen? Clean up any spills right away. Avoid walking on wet floors. Keep items that you use a lot in easy-to-reach places. If you need to reach something above you, use a strong step stool that has a grab bar. Keep electrical cords out of the way. Do not use floor polish or wax that makes floors slippery. If you must use wax, use non-skid floor wax. Do not have throw rugs and other things on the floor that can make you trip. What can I do with my stairs? Do not leave any items on the stairs. Make sure that there are handrails on both sides of the stairs and use them. Fix handrails that are broken or loose.  Make sure that handrails are as long as the stairways. Check any carpeting to make sure that it is firmly attached to the stairs. Fix any carpet that is loose or worn. Avoid having throw rugs at the top or bottom of the stairs. If you do have throw rugs, attach them  to the floor with carpet tape. Make sure that you have a light switch at the top of the stairs and the bottom of the stairs. If you do not have them, ask someone to add them for you. What else can I do to help prevent falls? Wear shoes that: Do not have high heels. Have rubber bottoms. Are comfortable and fit you well. Are closed at the toe. Do not wear sandals. If you use a stepladder: Make sure that it is fully opened. Do not climb a closed stepladder. Make sure that both sides of the stepladder are locked into place. Ask someone to hold it for you, if possible. Clearly mark and make sure that you can see: Any grab bars or handrails. First and last steps. Where the edge of each step is. Use tools that help you move around (mobility aids) if they are needed. These include: Canes. Walkers. Scooters. Crutches. Turn on the lights when you go into a dark area. Replace any light bulbs as soon as they burn out. Set up your furniture so you have a clear path. Avoid moving your furniture around. If any of your floors are uneven, fix them. If there are any pets around you, be aware of where they are. Review your medicines with your doctor. Some medicines can make you feel dizzy. This can increase your chance of falling. Ask your doctor what other things that you can do to help prevent falls. This information is not intended to replace advice given to you by your health care provider. Make sure you discuss any questions you have with your health care provider. Document Released: 11/17/2008 Document Revised: 06/29/2015 Document Reviewed: 02/25/2014 Elsevier Interactive Patient Education  2017 [employer].

## 2022-04-12 NOTE — Progress Notes (Signed)
[name] ([id number]) [phone].pdf Page 1 of 6 Visit Report for 04/08/2022 HPI Details Patient Name: Date of Service: [name], [name] 04/08/2022 3:00 PM Medical Record Number: [id number] Patient Account Number: [id number] Date of Birth/Sex: Treating RN: 12/10/1931 (89 y.o. [name] Primary Care Provider: Jenna Luo Other Clinician: Referring Provider: Treating Provider/Extender: RO BSO N, MICHA EL Ovidio Kin in Treatment: 13 History of Present Illness HPI Description: 01-07-2022 upon evaluation today patient appears to be doing poorly in regard to the wound on the right lateral lower extremity. She tells me that this occurred after a fall that she sustained on and around Labor Day weekend. Subsequently following this fall she tells me that she had a wound that really has not improved significantly. She has been seeing her primary care provider and they have been attempting to do what they could to get this improving overall. With that being said the patient has been on Silvadene cream, Neosporin, and more recently soaking with peroxide then wet to dry gauze recommended by primary care provider. Lastly Santyl was advised but they have not been able to get that filled as of yet. Patient does have a history of neuropathy, chronic kidney disease stage III, and hypertension along with chronic venous insufficiency otherwise she is fairly healthy and has nothing else that should affect her ability to heal. 01-14-2022 upon evaluation today patient appears to be doing well currently in regard to her wound. She has been tolerating the dressing changes without complication. With that being said she still has quite a bit of necrotic tissue buildup on the surface of the wound unfortunately. 01-21-2022 upon evaluation today patient actually is seeming to make excellent progress in regard to her wound. She is tolerating the dressing changes and  the Iodoflex seems to be doing a great job at this point. Fortunately I do not see any signs of active infection at this time. 12/26; wound on the right lateral lower leg which was initially trauma is quite a bit better down to half a centimeter in both dimensions. It has no depth. We are using Iodoflex under 3 layer compression. On the left leg she has a skin tear we have been using Xeroform this also looks quite good. The patient probably has chronic venous insufficiency although she also takes prednisone 10 mg a day adding to the overall fragility of her skin 02-12-2022 upon evaluation today patient appears to be doing well currently in regard to her wound. She has been tolerating the dressing changes without complication. The right leg is looking better the left leg is not feeling quite as much as we would like to see. I think that she has some necrotic tissue that needs to be cleaned away to be honest. Fortunately I do not see any signs of active infection locally nor systemically at this time which is great news. 02-19-2021 upon evaluation today patient's wounds actually are showing signs of improvement which is great news. Fortunately I do not see any signs of active infection locally or systemically which is great news. No fevers, chills, nausea, vomiting, or diarrhea. Upon inspection patient's wound actually is showing signs of improvement although she tells me that the left leg did have more discomfort after last week although did have to perform a fairly significant debridement last week. 02-26-2022 upon evaluation today patient's wounds actually are showing signs of improvement they are not draining nearly as much but this means they also have become a little bit more dry  in general and therefore the dressing material was sticking unfortunately. I do think that the collagen is done well but I believe on the right leg this is doing so well that possibly the switch of the Xeroform could be  helpful on the left leg I think the collagen is still the better way to go to be honest but we may need to do something to try to keep this from drying out. 03-05-2022 upon evaluation today patient appears to be doing well currently in regard to her wound. She has been tolerating the dressing changes without complication. Fortunately there does not appear to be any signs of infection locally nor systemically the right leg looks healed the left leg looks better. 03-14-2022 upon evaluation today patient appears to be doing pretty well currently in regard to her wound. This in fact is showing signs of improvement the right leg is still close the left leg is looking better in fact there is not even a need for sharp debridement today which is good news as well she really did not want me to perform any debridement whatsoever. She already said that before even got a chance to look at the wound. 03-25-2022 upon evaluation today patient appears to be doing much better in regard to her leg ulcer. She is actually been tolerating the dressing changes without complication and overall I am extremely pleased with where we stand today. 3/4; the patient has a small but still open area on the distal wound on the left lower leg. Significant venous insufficiency. The patient is disappointed because the wound has not healed Electronic Signature(s) Signed: 04/08/2022 4:38:26 PM By: Linton Ham MD Entered By: Linton Ham on 04/08/2022 15:17:19 [name] ([id number].pdf Page 2 of 6 -------------------------------------------------------------------------------- Physical Exam Details Patient Name: Date of Service: [name], [name] 04/08/2022 3:00 PM Medical Record Number: [id number] Patient Account Number: [id number] Date of Birth/Sex: Treating RN: April 23, 1931 (89 y.o. [name] Primary Care Provider: Jenna Luo Other Clinician: Referring Provider: Treating  Provider/Extender: RO BSO N, [place] EL Ovidio Kin in Treatment: 13 Constitutional Sitting or standing Blood Pressure is within target range for patient.. Pulse regular and within target range for patient.Marland Kitchen Respirations regular, non-labored and within target range.. Temperature is normal and within the target range for the patient.Marland Kitchen appears in no distress. Notes Wound exam; the area on the first metatarsal head has good granulation. I was able to remove some slough. There is no exposed bone and no evidence of infection. Electronic Signature(s) Signed: 04/08/2022 4:38:26 PM By: Linton Ham MD Entered By: Linton Ham on 04/08/2022 15:18:15 -------------------------------------------------------------------------------- Physician Orders Details Patient Name: Date of Service: [name] 04/08/2022 3:00 PM Medical Record Number: [id number] Patient Account Number: [id number] Date of Birth/Sex: Treating RN: 30-Sep-1931 (89 y.o. [name] Primary Care Provider: Jenna Luo Other Clinician: Referring Provider: Treating Provider/Extender: RO BSO N, [place] EL Ovidio Kin in Treatment: 13 Verbal / Phone Orders: No Diagnosis Coding Follow-up Appointments Return Appointment in 2 weeks. Bathing/ Shower/ Hygiene May shower with wound dressing protected with water repellent cover or cast protector. No tub bath. Anesthetic (Use 'Patient Medications' Section for Anesthetic Order Entry) Lidocaine applied to wound bed Edema Control - Lymphedema / Segmental Compressive Device / Other Elevate, Exercise Daily and A void Standing for Long Periods of Time. Elevate legs to the level of the heart and pump ankles as often as possible Elevate leg(s) parallel to the floor when sitting. Wound Treatment Wound #3 -  Lower Leg Wound Laterality: Left, Lateral ISEL, TRUGLIO ([id number]) [phone].pdf Page 3 of 6 Prim Dressing: Xeroform-HBD 2x2  (in/in) 3 x Per Week/30 Days ary Discharge Instructions: Apply Xeroform-HBD 2x2 (in/in) as directed Secondary Dressing: (BORDER) Zetuvit Plus SILICONE BORDER Dressing 4x4 (in/in) 3 x Per Week/30 Days Discharge Instructions: Please do not put silicone bordered dressings under wraps. Use non-bordered dressing only. Electronic Signature(s) Signed: 04/08/2022 4:38:26 PM By: Linton Ham MD Signed: 04/10/2022 3:12:30 PM By: Carlene Coria RN Entered By: Carlene Coria on 04/08/2022 15:02:17 -------------------------------------------------------------------------------- Problem List Details Patient Name: Date of Service: TANAYA, GOGAN 04/08/2022 3:00 PM Medical Record Number: [id number] Patient Account Number: [id number] Date of Birth/Sex: Treating RN: 24-Apr-1931 (89 y.o. [name] Primary Care Provider: Jenna Luo Other Clinician: Referring Provider: Treating Provider/Extender: Eldridge Dace, MICHA EL Ovidio Kin in Treatment: 13 Active Problems ICD-10 Encounter Code Description Active Date MDM Diagnosis I87.331 Chronic venous hypertension (idiopathic) with ulcer and inflammation of right 01/07/2022 No Yes lower extremity G60.3 Idiopathic progressive neuropathy 01/07/2022 No Yes L97.812 Non-pressure chronic ulcer of other part of right lower leg with fat layer 01/07/2022 No Yes exposed L97.822 Non-pressure chronic ulcer of other part of left lower leg with fat layer exposed1/23/2024 No Yes N18.30 Chronic kidney disease, stage 3 unspecified 01/07/2022 No Yes I10 Essential (primary) hypertension 01/07/2022 No Yes Inactive Problems Resolved Problems Electronic Signature(s) Signed: 04/08/2022 4:38:26 PM By: Linton Ham MD [name] ([id number]) [phone].pdf Page 4 of 6 Entered By: Linton Ham on 04/08/2022 15:16:24 -------------------------------------------------------------------------------- Progress Note Details Patient Name:  Date of Service: [name], [name] 04/08/2022 3:00 PM Medical Record Number: [id number] Patient Account Number: [id number] Date of Birth/Sex: Treating RN: Sep 19, 1931 (89 y.o. [name] Primary Care Provider: Jenna Luo Other Clinician: Referring Provider: Treating Provider/Extender: RO BSO N, [place] EL Ovidio Kin in Treatment: 13 Subjective History of Present Illness (HPI) 01-07-2022 upon evaluation today patient appears to be doing poorly in regard to the wound on the right lateral lower extremity. She tells me that this occurred after a fall that she sustained on and around Labor Day weekend. Subsequently following this fall she tells me that she had a wound that really has not improved significantly. She has been seeing her primary care provider and they have been attempting to do what they could to get this improving overall. With that being said the patient has been on Silvadene cream, Neosporin, and more recently soaking with peroxide then wet to dry gauze recommended by primary care provider. Lastly Santyl was advised but they have not been able to get that filled as of yet. Patient does have a history of neuropathy, chronic kidney disease stage III, and hypertension along with chronic venous insufficiency otherwise she is fairly healthy and has nothing else that should affect her ability to heal. 01-14-2022 upon evaluation today patient appears to be doing well currently in regard to her wound. She has been tolerating the dressing changes without complication. With that being said she still has quite a bit of necrotic tissue buildup on the surface of the wound unfortunately. 01-21-2022 upon evaluation today patient actually is seeming to make excellent progress in regard to her wound. She is tolerating the dressing changes and the Iodoflex seems to be doing a great job at this point. Fortunately I do not see any signs of active infection at this time. 12/26; wound  on the right lateral lower leg which was initially trauma is quite a bit better down  to half a centimeter in both dimensions. It has no depth. We are using Iodoflex under 3 layer compression. On the left leg she has a skin tear we have been using Xeroform this also looks quite good. The patient probably has chronic venous insufficiency although she also takes prednisone 10 mg a day adding to the overall fragility of her skin 02-12-2022 upon evaluation today patient appears to be doing well currently in regard to her wound. She has been tolerating the dressing changes without complication. The right leg is looking better the left leg is not feeling quite as much as we would like to see. I think that she has some necrotic tissue that needs to be cleaned away to be honest. Fortunately I do not see any signs of active infection locally nor systemically at this time which is great news. 02-19-2021 upon evaluation today patient's wounds actually are showing signs of improvement which is great news. Fortunately I do not see any signs of active infection locally or systemically which is great news. No fevers, chills, nausea, vomiting, or diarrhea. Upon inspection patient's wound actually is showing signs of improvement although she tells me that the left leg did have more discomfort after last week although did have to perform a fairly significant debridement last week. 02-26-2022 upon evaluation today patient's wounds actually are showing signs of improvement they are not draining nearly as much but this means they also have become a little bit more dry in general and therefore the dressing material was sticking unfortunately. I do think that the collagen is done well but I believe on the right leg this is doing so well that possibly the switch of the Xeroform could be helpful on the left leg I think the collagen is still the better way to go to be honest but we may need to do something to try to keep this from  drying out. 03-05-2022 upon evaluation today patient appears to be doing well currently in regard to her wound. She has been tolerating the dressing changes without complication. Fortunately there does not appear to be any signs of infection locally nor systemically the right leg looks healed the left leg looks better. 03-14-2022 upon evaluation today patient appears to be doing pretty well currently in regard to her wound. This in fact is showing signs of improvement the right leg is still close the left leg is looking better in fact there is not even a need for sharp debridement today which is good news as well she really did not want me to perform any debridement whatsoever. She already said that before even got a chance to look at the wound. 03-25-2022 upon evaluation today patient appears to be doing much better in regard to her leg ulcer. She is actually been tolerating the dressing changes without complication and overall I am extremely pleased with where we stand today. 3/4; the patient has a small but still open area on the distal wound on the left lower leg. Significant venous insufficiency. The patient is disappointed because the wound has not healed Objective [name], [name] ([id number]) [phone].pdf Page 5 of 6 Constitutional Sitting or standing Blood Pressure is within target range for patient.. Pulse regular and within target range for patient.Marland Kitchen Respirations regular, non-labored and within target range.. Temperature is normal and within the target range for the patient.Marland Kitchen appears in no distress. Vitals Time Taken: 2:50 PM, Weight: 140 lbs, Temperature: 98.2 F, Pulse: 80 bpm, Respiratory Rate: 18 breaths/min, Blood Pressure: 130/70 mmHg.  General Notes: Wound exam; the area on the first metatarsal head has good granulation. I was able to remove some slough. There is no exposed bone and no evidence of infection. Integumentary (Hair, Skin) Wound #3 status is  Open. Original cause of wound was Skin T ear/Laceration. The date acquired was: 01/21/2022. The wound has been in treatment 11 weeks. The wound is located on the Left,Lateral Lower Leg. The wound measures 0.4cm length x 0.2cm width x 0.1cm depth; 0.063cm^2 area and 0.006cm^3 volume. There is Fat Layer (Subcutaneous Tissue) exposed. There is no tunneling or undermining noted. There is a medium amount of serosanguineous drainage noted. There is no granulation within the wound bed. There is a medium (34-66%) amount of necrotic tissue within the wound bed including Eschar. 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 Bathing/ Shower/ Hygiene: May shower with wound dressing protected with water repellent cover or cast protector. No tub bath. Anesthetic (Use 'Patient Medications' Section for Anesthetic Order Entry): Lidocaine applied to wound bed Edema Control - Lymphedema / Segmental Compressive Device / Other: Elevate, Exercise Daily and Avoid Standing for Long Periods of Time. Elevate legs to the level of the heart and pump ankles as often as possible Elevate leg(s) parallel to the floor when sitting. WOUND #3: - Lower Leg Wound Laterality: Left, Lateral Prim Dressing: Xeroform-HBD 2x2 (in/in) 3 x Per Week/30 Days ary Discharge Instructions: Apply Xeroform-HBD 2x2 (in/in) as directed Secondary Dressing: (BORDER) Zetuvit Plus SILICONE BORDER Dressing 4x4 (in/in) 3 x Per Week/30 Days Discharge Instructions: Please do not put silicone bordered dressings under wraps. Use  non-bordered dressing only. 1. As it to zetuvui, Xeroform. 2. Silicone border 3 Electronic Signature(s) Signed: 04/08/2022 4:38:26 PM By: Linton Ham MD Entered By: Linton Ham on 04/08/2022 15:20:22 [name] ([id number].pdf Page 6 of 6 -------------------------------------------------------------------------------- SuperBill Details Patient Name: Date of Service: [name], [name] 04/08/2022 Medical Record Number: [id number] Patient Account Number: [id number] Date of Birth/Sex: Treating RN: 1931-08-29 (89 y.o. [name] Primary Care Provider: Jenna Luo Other Clinician: Referring Provider: Treating Provider/Extender: Eldridge Dace, MICHA EL Ovidio Kin in Treatment: 13 Diagnosis Coding ICD-10 Codes Code Description [phone] Chronic venous hypertension (idiopathic) with ulcer and inflammation of right lower extremity G60.3 Idiopathic progressive neuropathy L97.812 Non-pressure chronic ulcer of other part of right lower leg with fat layer exposed L97.822 Non-pressure chronic ulcer of other part of left lower leg with fat layer exposed N18.30 Chronic kidney disease, stage 3 unspecified I10 Essential (primary) hypertension Facility Procedures : CPT4 Code: [id number] Description: [id number] - WOUND CARE VISIT-LEV 2 EST PT Modifier: Quantity: 1 Electronic Signature(s) Signed: 04/08/2022 4:38:26 PM By: Linton Ham MD Signed: 04/10/2022 3:12:30 PM By: Carlene Coria RN Entered By: Carlene Coria on 04/08/2022 15:03:25

## 2022-04-12 NOTE — Progress Notes (Signed)
AVY, SIMONYAN ([id number]) [phone].pdf Page 1 of 9 Visit Report for 04/08/2022 Arrival Information Details Patient Name: Date of Service: [name], [name] 04/08/2022 3:00 PM Medical Record Number: [id number] Patient Account Number: [id number] Date of Birth/Sex: Treating RN: 06/28/1931 (89 y.o. [name]: [name] Other Clinician: Referring [name]: Treating Colleen Blanchard/Extender: [name], MICHA Fox [name] in Treatment: 13 Visit Information History Since Last Visit Added or deleted any medications: No Patient Arrived: [name] Any new allergies or adverse reactions: No Arrival Time: 14:46 Had a fall or experienced change in No Accompanied By: self activities of daily living that may affect Transfer Assistance: None risk of falls: Patient Identification Verified: Yes Signs or symptoms of abuse/neglect since last visito No Secondary Verification Process Completed: Yes Hospitalized since last visit: No Patient Requires Transmission-Based Precautions: No Implantable device outside of the clinic excluding No Patient Has Alerts: No cellular tissue based products placed in the center since last visit: Has Dressing in Place as Prescribed: Yes Pain Present Now: No Electronic Signature(s) Signed: 04/10/2022 3:12:30 PM By: Colleen Coria RN Entered By: [name] on 04/08/2022 14:52:17 -------------------------------------------------------------------------------- Clinic Level of Care Assessment Details Patient Name: Date of Service: [name], [name] 04/08/2022 3:00 PM Medical Record Number: [id number] Patient Account Number: [id number] Date of Birth/Sex: Treating RN: 1931-05-05 (89 y.o. [name] Primary Care [name]: [name] Other Clinician: Referring [name]: Treating [name]: RO BSO N, MICHA Fox [name] in Treatment: 13 Clinic Level of Care Assessment Items TOOL 4  Quantity Score X- 1 0 Use when only an EandM is performed on FOLLOW-UP visit ASSESSMENTS - Nursing Assessment / Reassessment X- 1 10 Reassessment of Co-morbidities (includes updates in patient status) X- 1 5 Reassessment of Adherence to Treatment Plan [name] ([id number]) [phone].pdf Page 2 of 9 ASSESSMENTS - Wound and Skin A ssessment / Reassessment X - Simple Wound Assessment / Reassessment - one wound 1 5 '[]'$  - 0 Complex Wound Assessment / Reassessment - multiple wounds '[]'$  - 0 Dermatologic / Skin Assessment (not related to wound area) ASSESSMENTS - Focused Assessment '[]'$  - 0 Circumferential Edema Measurements - multi extremities '[]'$  - 0 Nutritional Assessment / Counseling / Intervention '[]'$  - 0 Lower Extremity Assessment (monofilament, tuning fork, pulses) '[]'$  - 0 Peripheral Arterial Disease Assessment (using hand held doppler) ASSESSMENTS - Ostomy and/or Continence Assessment and Care '[]'$  - 0 Incontinence Assessment and Management '[]'$  - 0 Ostomy Care Assessment and Management (repouching, etc.) PROCESS - Coordination of Care X - Simple Patient / Family Education for ongoing care 1 15 '[]'$  - 0 Complex (extensive) Patient / Family Education for ongoing care '[]'$  - 0 Staff obtains Programmer, systems, Records, T Results / Process Orders est '[]'$  - 0 Staff telephones HHA, Nursing Homes / Clarify orders / etc '[]'$  - 0 Routine Transfer to another Facility (non-emergent condition) '[]'$  - 0 Routine Hospital Admission (non-emergent condition) '[]'$  - 0 New Admissions / Biomedical engineer / Ordering NPWT Apligraf, etc. , '[]'$  - 0 Emergency Hospital Admission (emergent condition) X- 1 10 Simple Discharge Coordination '[]'$  - 0 Complex (extensive) Discharge Coordination PROCESS - Special Needs '[]'$  - 0 Pediatric / Minor Patient Management '[]'$  - 0 Isolation Patient Management '[]'$  - 0 Hearing / Language / Visual special needs '[]'$  - 0 Assessment of Community  assistance (transportation, D/C planning, etc.) '[]'$  - 0 Additional assistance / Altered mentation '[]'$  - 0 Support Surface(s) Assessment (bed, cushion, seat, etc.) INTERVENTIONS - Wound Cleansing / Measurement  X - Simple Wound Cleansing - one wound 1 5 '[]'$  - 0 Complex Wound Cleansing - multiple wounds X- 1 5 Wound Imaging (photographs - any number of wounds) '[]'$  - 0 Wound Tracing (instead of photographs) X- 1 5 Simple Wound Measurement - one wound '[]'$  - 0 Complex Wound Measurement - multiple wounds INTERVENTIONS - Wound Dressings X - Small Wound Dressing one or multiple wounds 1 10 '[]'$  - 0 Medium Wound Dressing one or multiple wounds '[]'$  - 0 Large Wound Dressing one or multiple wounds '[]'$  - 0 Application of Medications - topical '[]'$  - 0 Application of Medications - injection INTERVENTIONS - Miscellaneous '[]'$  - 0 External ear exam [name], [name] ([id number]) [id number].pdf Page 3 of 9 '[]'$  - 0 Specimen Collection (cultures, biopsies, blood, body fluids, etc.) '[]'$  - 0 Specimen(s) / Culture(s) sent or taken to Lab for analysis '[]'$  - 0 Patient Transfer (multiple staff / Harrel Lemon Lift / Similar devices) '[]'$  - 0 Simple Staple / Suture removal (25 or less) '[]'$  - 0 Complex Staple / Suture removal (26 or more) '[]'$  - 0 Hypo / Hyperglycemic Management (close monitor of Blood Glucose) '[]'$  - 0 Ankle / Brachial Index (ABI) - do not check if billed separately X- 1 5 Vital Signs Has the patient been seen at the hospital within the last three years: Yes Total Score: 75 Level Of Care: New/Established - Level 2 Electronic Signature(s) Signed: 04/10/2022 3:12:30 PM By: Colleen Coria RN Entered By: [name] on 04/08/2022 15:03:19 -------------------------------------------------------------------------------- Encounter Discharge Information Details Patient Name: Date of Service: [name] 04/08/2022 3:00 PM Medical Record Number: [id number] Patient Account Number:  [id number] Date of Birth/Sex: Treating RN: [birth year] (89 y.o. [name] Primary Care Jiya Kissinger: [name] Other Clinician: Referring Colleen Koslowski: Treating Noely Kuhnle/Extender: RO BSO Delane Ginger, MICHA Fox [name] in Treatment: 13 Encounter Discharge Information Items Discharge Condition: Stable Ambulatory Status: Ambulatory Discharge Destination: Home Transportation: Private Auto Accompanied By: self Schedule Follow-up Appointment: Yes Clinical Summary of Care: Electronic Signature(s) Signed: 04/10/2022 3:12:30 PM By: Colleen Coria RN Entered By: [name] on 04/08/2022 15:04:04 -------------------------------------------------------------------------------- Lower Extremity Assessment Details Patient Name: Date of Service: [name], [name] 04/08/2022 3:00 PM [name] ([id number].pdf Page 4 of 9 Medical Record Number: [id number] Patient Account Number: [id number] Date of Birth/Sex: Treating RN: 1931-04-24 (89 y.o. [name] Primary Care [place] Vanover: [name] Other Clinician: Referring Tabria Steines: Treating Edouard Gikas/Extender: RO BSO N, MICHA Fox [name] in Treatment: 13 Edema Assessment Assessed: [Left: No] [Right: No] Edema: [Left: Ye] [Right: s] Calf Left: Right: Point of Measurement: 32 cm From Medial Instep 34 cm Ankle Left: Right: Point of Measurement: 11 cm From Medial Instep 23 cm Electronic Signature(s) Signed: 04/10/2022 3:12:30 PM By: Colleen Coria RN Entered By: [name] on 04/08/2022 14:54:05 -------------------------------------------------------------------------------- Multi Wound Chart Details Patient Name: Date of Service: [name] 04/08/2022 3:00 PM Medical Record Number: [id number] Patient Account Number: [id number] Date of Birth/Sex: Treating RN: 09/20/1931 (89 y.o. [name] Primary Care Beldon Nowling: [name] Other Clinician: Referring  Nikko Quast: Treating Kariel Skillman/Extender: RO BSO N, MICHA Fox [name] in Treatment: 13 Vital Signs Height(in): Pulse(bpm): 80 Weight(lbs): 140 Blood Pressure(mmHg): 130/70 Body Mass Index(BMI): Temperature(F): 98.2 Respiratory Rate(breaths/min): 18 [3:Photos:] [N/A:N/A] Left, Lateral Lower Leg N/A N/A Wound Location: Skin Tear/Laceration N/A N/A Wounding Event: Skin Tear N/A N/A Primary Etiology: Hypertension N/A N/A Comorbid History: 01/21/2022 N/A N/A Date Acquired: 11 N/A N/A Weeks of Treatment: Open N/A N/A  Wound Status: No N/A N/A Wound Recurrence: 0.4x0.2x0.1 N/A N/A Measurements L x W x D (cm) 0.063 N/A N/A A (cm) : [name], [name] ([id number].pdf Page 5 of 9 0.006 N/A N/A Volume (cm) : 97.50% N/A N/A % Reduction in Area: 97.60% N/A N/A % Reduction in Volume: Full Thickness Without Exposed N/A N/A Classification: Support Structures Medium N/A N/A Exudate A mount: Serosanguineous N/A N/A Exudate Type: red, brown N/A N/A Exudate Color: None Present (0%) N/A N/A Granulation Amount: Medium (34-66%) N/A N/A Necrotic Amount: Eschar N/A N/A Necrotic Tissue: Fat Layer (Subcutaneous Tissue): Yes N/A N/A Exposed Structures: Fascia: No Tendon: No Muscle: No Joint: No Bone: No None N/A N/A Epithelialization: Treatment Notes Electronic Signature(s) Signed: 04/10/2022 3:12:30 PM By: Colleen Coria RN Entered By: [name] on 04/08/2022 14:54:09 -------------------------------------------------------------------------------- [place] Details Patient Name: Date of Service: [name] 04/08/2022 3:00 PM Medical Record Number: [id number] Patient Account Number: [id number] Date of Birth/Sex: Treating RN: [birth year] (89 y.o. [name] Primary Care [place] Catarino: [name] Other Clinician: Referring Zaevion Parke: Treating Yoanna Jurczyk/Extender: RO BSO N, MICHA Fox [name] in Treatment: 13 Active Inactive Wound/Skin Impairment Nursing Diagnoses: Knowledge deficit related to ulceration/compromised skin integrity Goals: Patient/caregiver will verbalize understanding of skin care regimen Date Initiated: 01/07/2022 Target Resolution Date: 05/04/2022 Goal Status: Active Ulcer/skin breakdown will have a volume reduction of 30% by week 4 Date Initiated: 01/07/2022 Date Inactivated: 02/28/2022 Target Resolution Date: 02/07/2022 Goal Status: Met Ulcer/skin breakdown will have a volume reduction of 50% by week 8 Date Initiated: 01/07/2022 Date Inactivated: 03/14/2022 Target Resolution Date: 03/10/2022 Goal Status: Unmet Unmet Reason: comorbidities Ulcer/skin breakdown will have a volume reduction of 80% by week 12 Date Initiated: 01/07/2022 Target Resolution Date: 04/08/2022 Goal Status: Active Ulcer/skin breakdown will heal within 14 weeks Date Initiated: 01/07/2022 Target Resolution Date: 05/10/2022 Goal Status: Active Interventions: Assess patient/caregiver ability to obtain necessary supplies Assess patient/caregiver ability to perform ulcer/skin care regimen upon admission and as needed [name], [name] ([id number]) [phone].pdf Page 6 of 9 Assess ulceration(s) every visit Notes: Electronic Signature(s) Signed: 04/10/2022 3:12:30 PM By: Colleen Coria RN Entered By: [name] on 04/08/2022 14:54:36 -------------------------------------------------------------------------------- Pain Assessment Details Patient Name: Date of Service: [name], [name] 04/08/2022 3:00 PM Medical Record Number: [id number] Patient Account Number: [id number] Date of Birth/Sex: Treating RN: 09/16/1931 (89 y.o. [name] Primary Care Terin Dierolf: [name] Other Clinician: Referring Alekzander Cardell: Treating Raymond Azure/Extender: RO BSO N, Clyde Fox [name] in Treatment: 13 Active Problems Location of Pain Severity and Description  of Pain Patient Has Paino No Site Locations Pain Management and Medication Current Pain Management: Electronic Signature(s) Signed: 04/10/2022 3:12:30 PM By: Colleen Coria RN Entered By: [name] on 04/08/2022 14:53:11 [name] ([id number] Page 7 of 9 -------------------------------------------------------------------------------- Patient/Caregiver Education Details Patient Name: Date of Service: [name], [name] 3/4/2024andnbsp3:00 PM Medical Record Number: [id number] Patient Account Number: [id number] Date of Birth/Gender: Treating RN: 1931-08-19 (89 y.o. [name] Primary Care Physician: [name] Other Clinician: Referring Physician: Treating Physician/Extender: RO BSO Delane Ginger, [place] Fox [name] in Treatment: 13 Education Assessment Education Provided To: Patient Education Topics Provided Wound/Skin Impairment: Methods: Explain/Verbal Responses: State content correctly [employer]) Signed: 04/10/2022 3:12:30 PM By: Colleen Coria RN Entered By: [name] on 04/08/2022 14:56:08 -------------------------------------------------------------------------------- Wound Assessment Details Patient Name: Date of Service: [name], [name] 04/08/2022 3:00 PM Medical Record Number: [id number] Patient Account Number: [id number] Date of Birth/Sex: Treating RN: October 19, 1931 (  89 y.o. [name] Primary Care Dorrien Grunder: [name] Other Clinician: Referring Jacqulynn Shappell: Treating [place] Leinen/Extender: RO BSO N, MICHA Fox [name] in Treatment: 13 Wound Status Wound Number: 3 Primary Etiology: Skin Tear Wound Location: Left, Lateral Lower Leg Wound Status: Open Wounding Event: Skin Tear/Laceration Comorbid History: Hypertension Date Acquired: 01/21/2022 Weeks Of Treatment: 11 Clustered Wound: No Photos [name], [name] ([id number]) [phone].pdf Page 8 of  9 Wound Measurements Length: (cm) 0.4 Width: (cm) 0.2 Depth: (cm) 0.1 Area: (cm) 0.063 Volume: (cm) 0.006 % Reduction in Area: 97.5% % Reduction in Volume: 97.6% Epithelialization: None Tunneling: No Undermining: No Wound Description Classification: Full Thickness Without Exposed Support Exudate Amount: Medium Exudate Type: Serosanguineous Exudate Color: red, brown Structures Foul Odor After Cleansing: No Slough/Fibrino Yes Wound Bed Granulation Amount: None Present (0%) Exposed Structure Necrotic Amount: Medium (34-66%) Fascia Exposed: No Necrotic Quality: Eschar Fat Layer (Subcutaneous Tissue) Exposed: Yes Tendon Exposed: No Muscle Exposed: No Joint Exposed: No Bone Exposed: No Treatment Notes Wound #3 (Lower Leg) Wound Laterality: Left, Lateral Cleanser Peri-Wound Care Topical Primary Dressing Xeroform-HBD 2x2 (in/in) Discharge Instruction: Apply Xeroform-HBD 2x2 (in/in) as directed Secondary Dressing (BORDER) Zetuvit Plus SILICONE BORDER Dressing 4x4 (in/in) Discharge Instruction: Please do not put silicone bordered dressings under wraps. Use non-bordered dressing only. Secured With Compression Wrap Compression Stockings Environmental education officer) Signed: 04/10/2022 3:12:30 PM By: Colleen Coria RN Entered By: [name] on 04/08/2022 14:53:40 [name] ([id number].pdf Page 9 of 9 -------------------------------------------------------------------------------- Vitals Details Patient Name: Date of Service: [name], [name] 04/08/2022 3:00 PM Medical Record Number: [id number] Patient Account Number: [id number] Date of Birth/Sex: Treating RN: 04/15/1931 (89 y.o. [name] Primary Care Shanaiya Bene: [name] Other Clinician: Referring Makya Yurko: Treating Emeka Lindner/Extender: RO BSO N, MICHA Fox [name] in Treatment: 13 Vital Signs Time Taken: 14:50 Temperature (F): 98.2 Weight (lbs):  140 Pulse (bpm): 80 Respiratory Rate (breaths/min): 18 Blood Pressure (mmHg): 130/70 Reference Range: 80 - 120 mg / dl Electronic Signature(s) Signed: 04/10/2022 3:12:30 PM By: Colleen Coria RN Entered By: [name] on 04/08/2022 14:53:04

## 2022-04-15 ENCOUNTER — Ambulatory Visit: Payer: 59 | Admitting: Internal Medicine

## 2022-04-16 ENCOUNTER — Other Ambulatory Visit: Payer: Self-pay | Admitting: Family Medicine

## 2022-04-22 ENCOUNTER — Ambulatory Visit: Payer: 59 | Admitting: Physician Assistant

## 2022-04-22 ENCOUNTER — Telehealth: Payer: Self-pay

## 2022-04-22 NOTE — Telephone Encounter (Signed)
Pt called and states she has been having issues with back pain, since a fall in December. Pt states pain has been "tolerable" but over the weekend the pain became worse.  Pt states she took 2 of her 10 mg Prednisone yesterday and 2 this morning and it seems to be helping with the pain. Pt asks if she can continue to take 2 until her back is better or does she need to come in? Thanks.

## 2022-04-27 ENCOUNTER — Other Ambulatory Visit: Payer: Self-pay | Admitting: Family Medicine

## 2022-04-27 DIAGNOSIS — F5101 Primary insomnia: Secondary | ICD-10-CM

## 2022-04-29 NOTE — Telephone Encounter (Signed)
Requested medications are due for refill today.  Provider to determine  Requested medications are on the active medications list.  yes  Last refill. varies  Future visit scheduled.   no  Notes to clinic.  Both refills are not delegated.    Requested Prescriptions  Pending Prescriptions Disp Refills   ALPRAZolam (XANAX) 0.5 MG tablet [Pharmacy Med Name: ALPRAZOLAM 0.5MG  TABLET] 30 tablet 0    Sig: TAKE ONE TABLET BY MOUTH THREE TIMES A DAY AS NEEDED FOR ANXIETY     Not Delegated - Psychiatry: Anxiolytics/Hypnotics 2 Failed - 04/29/2022  1:07 PM      Failed - This refill cannot be delegated      Failed - Urine Drug Screen completed in last 360 days      Failed - Valid encounter within last 6 months    Recent Outpatient Visits           11 months ago PMR (polymyalgia rheumatica) ([place])   [place] Pick[facility], Cammie Mcgee, MD   1 year ago PMR (polymyalgia rheumatica) ([place])   [place] Pickard, Cammie Mcgee, MD   2 years ago PMR (polymyalgia rheumatica) ([place])   [facility] Susy Frizzle, MD   2 years ago Stage 3b chronic kidney disease ([place])   [place] Pickard, Cammie Mcgee, MD   2 years ago Bilateral leg weakness   [place] Pick[facility], Cammie Mcgee, MD              Passed - Patient is not pregnant       predniSONE (DELTASONE) 10 MG tablet [Pharmacy Med Name: PREDNISONE 10MG  TABLET] 30 tablet 0    Sig: TAKE ONE TABLET BY MOUTH ONCE A DAY WITH BREAKFAST     Not Delegated - Endocrinology:  Oral Corticosteroids Failed - 04/29/2022  1:07 PM      Failed - This refill cannot be delegated      Failed - Manual Review: Eye exam for IOP if prolonged treatment      Failed - Glucose (serum) in normal range and within 180 days    Glucose, Bld  Date Value Ref Range Status  02/25/2022 123 (H) 65 - 99 mg/dL Final    Comment:    .            Fasting reference interval . For someone without known  diabetes, a glucose value between 100 and 125 mg/dL is consistent with prediabetes and should be confirmed with a follow-up test. .          Failed - Last BP in normal range    BP Readings from Last 1 Encounters:  03/12/22 (!) 156/68         Failed - Valid encounter within last 6 months    Recent Outpatient Visits           11 months ago PMR (polymyalgia rheumatica) ([place])   [place] Pickard, Cammie Mcgee, MD   1 year ago PMR (polymyalgia rheumatica) ([place])   [place] Susy Frizzle, MD   2 years ago PMR (polymyalgia rheumatica) ([place])   [place] Susy Frizzle, MD   2 years ago Stage 3b chronic kidney disease ([place])   [facility]y Frizzle, MD   2 years ago Bilateral leg weakness   [place] Pickard, Cammie Mcgee, MD  Failed - Bone Mineral Density or Dexa Scan completed in the last 2 years      Passed - K in normal range and within 180 days    Potassium  Date Value Ref Range Status  02/25/2022 4.1 3.5 - 5.3 mmol/L Final         Passed - Na in normal range and within 180 days    Sodium  Date Value Ref Range Status  02/25/2022 141 135 - 146 mmol/L Final

## 2022-05-02 NOTE — Telephone Encounter (Signed)
Requesting status of refill request. Please advise at [phone].

## 2022-05-07 ENCOUNTER — Encounter: Payer: 59 | Attending: Physician Assistant | Admitting: Physician Assistant

## 2022-05-07 DIAGNOSIS — L97812 Non-pressure chronic ulcer of other part of right lower leg with fat layer exposed: Secondary | ICD-10-CM | POA: Diagnosis not present

## 2022-05-07 DIAGNOSIS — W19XXXA Unspecified fall, initial encounter: Secondary | ICD-10-CM | POA: Insufficient documentation

## 2022-05-07 DIAGNOSIS — Z09 Encounter for follow-up examination after completed treatment for conditions other than malignant neoplasm: Secondary | ICD-10-CM | POA: Insufficient documentation

## 2022-05-07 DIAGNOSIS — I87331 Chronic venous hypertension (idiopathic) with ulcer and inflammation of right lower extremity: Secondary | ICD-10-CM | POA: Insufficient documentation

## 2022-05-07 DIAGNOSIS — G603 Idiopathic progressive neuropathy: Secondary | ICD-10-CM | POA: Diagnosis not present

## 2022-05-07 DIAGNOSIS — N183 Chronic kidney disease, stage 3 unspecified: Secondary | ICD-10-CM | POA: Diagnosis not present

## 2022-05-07 DIAGNOSIS — L97822 Non-pressure chronic ulcer of other part of left lower leg with fat layer exposed: Secondary | ICD-10-CM | POA: Diagnosis not present

## 2022-05-07 DIAGNOSIS — I129 Hypertensive chronic kidney disease with stage 1 through stage 4 chronic kidney disease, or unspecified chronic kidney disease: Secondary | ICD-10-CM | POA: Insufficient documentation

## 2022-05-07 DIAGNOSIS — S81812A Laceration without foreign body, left lower leg, initial encounter: Secondary | ICD-10-CM | POA: Diagnosis not present

## 2022-05-07 NOTE — Progress Notes (Addendum)
[name], [name] ([id number]) [phone].pdf Page 1 of 7 Visit Report for 05/07/2022 Arrival Information Details Patient Name: Date of Service: [name], [name] 05/07/2022 3:45 PM Medical Record Number: [id number] Patient Account Number: [id number] Date of Birth/Sex: Treating RN: November 27, 1931 (89 y.o. Valetta Close Primary Care Kiev Labrosse: Jenna Luo Other Clinician: Massie Kluver Referring Wentworth Edelen: Treating Maggie Senseney/Extender: Barbette Merino in Treatment: 17 Visit Information History Since Last Visit Added or deleted any medications: No Patient Arrived: [name] Any new allergies or adverse reactions: No Arrival Time: 15:55 Had a fall or experienced change in No Accompanied By: self activities of daily living that may affect Transfer Assistance: EasyPivot Patient Lift risk of falls: Patient Identification Verified: Yes Hospitalized since last visit: No Secondary Verification Process Completed: Yes Has Dressing in Place as Prescribed: Yes Patient Requires Transmission-Based Precautions: No Pain Present Now: No Patient Has Alerts: No Electronic Signature(s) Signed: 05/07/2022 4:27:12 PM By: Levora Dredge Entered By: Levora Dredge on 05/07/2022 15:59:46 -------------------------------------------------------------------------------- Clinic Level of Care Assessment Details Patient Name: Date of Service: [name], [name] 05/07/2022 3:45 PM Medical Record Number: [id number] Patient Account Number: [id number] Date of Birth/Sex: Treating RN: 1931/05/17 (89 y.o. Valetta Close Primary Care Naren Benally: Jenna Luo Other Clinician: Referring Mieke Brinley: Treating Jessi Pitstick/Extender: Barbette Merino in Treatment: 17 Clinic Level of Care Assessment Items TOOL 4 Quantity Score []  - 0 Use when only an EandM is performed on FOLLOW-UP visit ASSESSMENTS - Nursing Assessment / Reassessment X- 1 10 Reassessment of  Co-morbidities (includes updates in patient status) X- 1 5 Reassessment of Adherence to Treatment Plan ASSESSMENTS - Wound and Skin A ssessment / Reassessment X - Simple Wound Assessment / Reassessment - one wound 1 5 []  - 0 Complex Wound Assessment / Reassessment - multiple wounds []  - 0 Dermatologic / Skin Assessment (not related to wound area) ASSESSMENTS - Focused Assessment []  - 0 Circumferential Edema Measurements - multi extremities []  - 0 Nutritional Assessment / Counseling / Intervention []  - 0 Lower Extremity Assessment (monofilament, tuning fork, pulses) []  - 0 Peripheral Arterial Disease Assessment (using hand held doppler) ASSESSMENTS - Ostomy and/or Continence Assessment and Care []  - 0 Incontinence Assessment and Management []  - 0 Ostomy Care Assessment and Management (repouching, etc.) PROCESS - Coordination of Care X - Simple Patient / Family Education for ongoing care 1 15 []  - 0 Complex (extensive) Patient / Family Education for ongoing care [name], [name] ([id number]) [phone].pdf Page 2 of 7 X- 1 10 Staff obtains Consents, Records, T Results / Process Orders est []  - 0 Staff telephones HHA, Nursing Homes / Clarify orders / etc []  - 0 Routine Transfer to another Facility (non-emergent condition) []  - 0 Routine Hospital Admission (non-emergent condition) []  - 0 New Admissions / Biomedical engineer / Ordering NPWT Apligraf, etc. , []  - 0 Emergency Hospital Admission (emergent condition) X- 1 10 Simple Discharge Coordination []  - 0 Complex (extensive) Discharge Coordination PROCESS - Special Needs []  - 0 Pediatric / Minor Patient Management []  - 0 Isolation Patient Management []  - 0 Hearing / Language / Visual special needs []  - 0 Assessment of Community assistance (transportation, D/C planning, etc.) []  - 0 Additional assistance / Altered mentation []  - 0 Support Surface(s) Assessment (bed, cushion, seat,  etc.) INTERVENTIONS - Wound Cleansing / Measurement X - Simple Wound Cleansing - one wound 1 5 []  - 0 Complex Wound Cleansing - multiple wounds X- 1 5 Wound Imaging (photographs - any number of wounds) []  - 0  Wound Tracing (instead of photographs) X- 1 5 Simple Wound Measurement - one wound []  - 0 Complex Wound Measurement - multiple wounds INTERVENTIONS - Wound Dressings []  - 0 Small Wound Dressing one or multiple wounds []  - 0 Medium Wound Dressing one or multiple wounds []  - 0 Large Wound Dressing one or multiple wounds []  - 0 Application of Medications - topical []  - 0 Application of Medications - injection INTERVENTIONS - Miscellaneous []  - 0 External ear exam []  - 0 Specimen Collection (cultures, biopsies, blood, body fluids, etc.) []  - 0 Specimen(s) / Culture(s) sent or taken to Lab for analysis []  - 0 Patient Transfer (multiple staff / Civil Service fast streamer / Similar devices) []  - 0 Simple Staple / Suture removal (25 or less) []  - 0 Complex Staple / Suture removal (26 or more) []  - 0 Hypo / Hyperglycemic Management (close monitor of Blood Glucose) []  - 0 Ankle / Brachial Index (ABI) - do not check if billed separately X- 1 5 Vital Signs Has the patient been seen at the hospital within the last three years: Yes Total Score: 75 Level Of Care: New/Established - Level 2 Electronic Signature(s) Signed: 05/07/2022 4:27:12 PM By: Levora Dredge Entered By: Levora Dredge on 05/07/2022 16:20:48 [name] ([id number]) [id number] Page 3 of 7 -------------------------------------------------------------------------------- Encounter Discharge Information Details Patient Name: Date of Service: [name], [name] 05/07/2022 3:45 PM Medical Record Number: [id number] Patient Account Number: [id number] Date of Birth/Sex: Treating RN: April 14, 1931 (89 y.o. Valetta Close Primary Care Alegria Dominique: Jenna Luo Other Clinician: Referring  Jacee Enerson: Treating Luciann Gossett/Extender: Barbette Merino in Treatment: 17 Encounter Discharge Information Items Discharge Condition: Stable Ambulatory Status: Walker Discharge Destination: Home Transportation: Private Auto Accompanied By: family Schedule Follow-up Appointment: No Clinical Summary of Care: Electronic Signature(s) Signed: 05/07/2022 4:27:12 PM By: Levora Dredge Entered By: Levora Dredge on 05/07/2022 16:21:52 -------------------------------------------------------------------------------- Lower Extremity Assessment Details Patient Name: Date of Service: [name], [name] 05/07/2022 3:45 PM Medical Record Number: [id number] Patient Account Number: [id number] Date of Birth/Sex: Treating RN: 1931-09-19 (89 y.o. Valetta Close Primary Care Kristalyn Bergstresser: Jenna Luo Other Clinician: Referring Finnley Lewis: Treating Burtis Imhoff/Extender: Veronia Beets Weeks in Treatment: 17 Edema Assessment Assessed: [Left: No] [Right: No] Edema: [Left: Ye] [Right: s] Calf Left: Right: Point of Measurement: 32 cm From Medial Instep 34 cm Ankle Left: Right: Point of Measurement: 11 cm From Medial Instep 22.9 cm Vascular Assessment Pulses: Dorsalis Pedis Palpable: [Left:Yes] Posterior Tibial Palpable: [Left:Yes] Electronic Signature(s) Signed: 05/07/2022 4:27:12 PM By: Levora Dredge Entered By: Levora Dredge on 05/07/2022 16:03:10 -------------------------------------------------------------------------------- Multi Wound Chart Details Patient Name: Date of Service: [name] 05/07/2022 3:45 PM Medical Record Number: [id number] Patient Account Number: [id number] Date of Birth/Sex: Treating RN: 06-13-1931 (89 y.o. Valetta Close Primary Care Nichola Cieslinski: Jenna Luo Other Clinician: Referring Charl Wellen: Treating Genieve Ramaswamy/Extender: Barbette Merino in Treatment: [address] [place] ([id number])  [id number] Page 4 of 7 Vital Signs Height(in): Pulse(bpm): 86 Weight(lbs): 140 Blood Pressure(mmHg): 148/59 Body Mass Index(BMI): Temperature(F): 98.2 Respiratory Rate(breaths/min): 18 [3:Photos:] [N/A:N/A] Left, Lateral Lower Leg N/A N/A Wound Location: Skin T ear/Laceration N/A N/A Wounding Event: Skin T ear N/A N/A Primary Etiology: Hypertension N/A N/A Comorbid History: 01/21/2022 N/A N/A Date Acquired: 15 N/A N/A Weeks of Treatment: Open N/A N/A Wound Status: No N/A N/A Wound Recurrence: 0x0x0 N/A N/A Measurements L x W x D (cm) 0 N/A N/A A (cm) : rea 0 N/A N/A Volume (cm) : 100.00% N/A N/A % Reduction in  A rea: 100.00% N/A N/A % Reduction in Volume: Full Thickness Without Exposed N/A N/A Classification: Support Structures None Present N/A N/A Exudate Amount: None Present (0%) N/A N/A Granulation Amount: None Present (0%) N/A N/A Necrotic Amount: Fascia: No N/A N/A Exposed Structures: Fat Layer (Subcutaneous Tissue): No Tendon: No Muscle: No Joint: No Bone: No Large (67-100%) N/A N/A Epithelialization: Treatment Notes Wound #3 (Lower Leg) Wound Laterality: Left, Lateral Cleanser Peri-Wound Care Topical Primary Dressing Secondary Dressing Secured With Compression Wrap Compression Stockings Add-Ons Electronic Signature(s) Signed: 05/07/2022 4:25:26 PM By: Levora Dredge Entered By: Levora Dredge on 05/07/2022 16:25:26 -------------------------------------------------------------------------------- Multi-Disciplinary Care Plan Details Patient Name: Date of Service: [name] 05/07/2022 3:45 PM Medical Record Number: [id number] Patient Account Number: [id number] Date of Birth/Sex: Treating RN: 12-Mar-1931 (89 y.o. Valetta Close [place], [place] Carlean Jews ([id number]) [phone].pdf Page 5 of 7 Primary Care Chrishawna Farina: Jenna Luo Other Clinician: Referring Latrise Bowland: Treating  Shadoe Bethel/Extender: Barbette Merino in Treatment: 17 Active Inactive Electronic Signature(s) Signed: 05/07/2022 4:26:31 PM By: Levora Dredge Entered By: Levora Dredge on 05/07/2022 16:26:30 -------------------------------------------------------------------------------- Pain Assessment Details Patient Name: Date of Service: [name], [name] 05/07/2022 3:45 PM Medical Record Number: [id number] Patient Account Number: [id number] Date of Birth/Sex: Treating RN: [birth year] (89 y.o. Valetta Close Primary Care Titan Karner: Jenna Luo Other Clinician: Referring Mariadel Mruk: Treating Harleigh Civello/Extender: Barbette Merino in Treatment: 17 Active Problems Location of Pain Severity and Description of Pain Patient Has Paino No Site Locations Rate the pain. Current Pain Level: 0 Pain Management and Medication Current Pain Management: Electronic Signature(s) Signed: 05/07/2022 4:27:12 PM By: Levora Dredge Entered By: Levora Dredge on 05/07/2022 16:00:05 -------------------------------------------------------------------------------- Patient/Caregiver Education Details Patient Name: Date of Service: [name] 4/2/2024andnbsp3:45 PM Medical Record Number: [id number] Patient Account Number: [id number] Date of Birth/Gender: Treating RN: 1932-01-19 (89 y.o. Valetta Close Primary Care Physician: Jenna Luo Other Clinician: Referring Physician: Treating Physician/Extender: Barbette Merino in Treatment: 17 Education Assessment Education Provided To: Patient [name], [name] ([id number]) [id number] Page 6 of 7 Education Topics Provided Wound/Skin Impairment: Handouts: Other: care of healed wound Methods: Explain/Verbal Responses: State content correctly Electronic Signature(s) Signed: 05/07/2022 4:27:12 PM By: Levora Dredge Entered By: Levora Dredge on 05/07/2022  16:21:21 -------------------------------------------------------------------------------- Wound Assessment Details Patient Name: Date of Service: [name], [name] 05/07/2022 3:45 PM Medical Record Number: [id number] Patient Account Number: [id number] Date of Birth/Sex: Treating RN: Jul 06, 1931 (89 y.o. Valetta Close Primary Care Kelcey Korus: Jenna Luo Other Clinician: Referring Ica Daye: Treating Emalina Dubreuil/Extender: Veronia Beets Weeks in Treatment: 17 Wound Status Wound Number: 3 Primary Etiology: Skin Tear Wound Location: Left, Lateral Lower Leg Wound Status: Open Wounding Event: Skin Tear/Laceration Comorbid History: Hypertension Date Acquired: 01/21/2022 Weeks Of Treatment: 15 Clustered Wound: No Photos Wound Measurements Length: (cm) Width: (cm) Depth: (cm) Area: (cm) Volume: (cm) 0 % Reduction in Area: 100% 0 % Reduction in Volume: 100% 0 Epithelialization: Large (67-100%) 0 Tunneling: No 0 Undermining: No Wound Description Classification: Full Thickness Without Exposed Suppor Exudate Amount: None Present t Structures Foul Odor After Cleansing: No Slough/Fibrino No Wound Bed Granulation Amount: None Present (0%) Exposed Structure Necrotic Amount: None Present (0%) Fascia Exposed: No Fat Layer (Subcutaneous Tissue) Exposed: No Tendon Exposed: No Muscle Exposed: No Joint Exposed: No Bone Exposed: No Electronic Signature(s) Signed: 05/07/2022 4:27:12 PM By: Levora Dredge Entered By: Levora Dredge on 05/07/2022 16:04:09 [name] ([id number]) [id number] Page 7 of 7 -------------------------------------------------------------------------------- Vitals Details Patient Name: Date of Service: [name], [name]  05/07/2022 3:45 PM Medical Record Number: [id number] Patient Account Number: [id number] Date of Birth/Sex: Treating RN: 26-Nov-1931 (89 y.o. Valetta Close Primary Care Minah Axelrod: Jenna Luo Other Clinician: Referring Sylar Voong: Treating Marisa Hufstetler/Extender: Barbette Merino in Treatment: 17 Vital Signs Time Taken: 15:59 Temperature (F): 98.2 Weight (lbs): 140 Pulse (bpm): 86 Respiratory Rate (breaths/min): 18 Blood Pressure (mmHg): 148/59 Reference Range: 80 - 120 mg / dl Electronic Signature(s) Signed: 05/07/2022 4:27:12 PM By: Levora Dredge Entered By: Levora Dredge on 05/07/2022 16:00:00

## 2022-05-07 NOTE — Progress Notes (Addendum)
JARRETT, CHICOINE ([id number]) [id number] Page 1 of 6 Visit Report for 05/07/2022 Chief Complaint Document Details Patient Name: Date of Service: [name], [name] 05/07/2022 3:45 PM Medical Record Number: [id number] Patient Account Number: [id number] Date of Birth/Sex: Treating RN: 1931/09/09 (89 y.o. [name] Primary Care Provider: [name] Other Clinician: Betha Loa Referring Provider: Treating Provider/Extender: Trula Ore in Treatment: 17 Information Obtained from: Patient Chief Complaint Right LE Ulcer Electronic Signature(s) Signed: 05/07/2022 3:51:00 PM By: Lenda Kelp PA-C Entered By: Lenda Kelp on 05/07/2022 15:51:00 -------------------------------------------------------------------------------- HPI Details Patient Name: Date of Service: [name] 05/07/2022 3:45 PM Medical Record Number: [id number] Patient Account Number: [id number] Date of Birth/Sex: Treating RN: January 14, 1932 (89 y.o. Esmeralda Links Primary Care Provider: [name] Other Clinician: Referring Provider: Treating Provider/Extender: Trula Ore in Treatment: 17 History of Present Illness HPI Description: 01-07-2022 upon evaluation today patient appears to be doing poorly in regard to the wound on the right lateral lower extremity. She tells me that this occurred after a fall that she sustained on and around Labor Day weekend. Subsequently following this fall she tells me that she had a wound that really has not improved significantly. She has been seeing her primary care provider and they have been attempting to do what they could to get this improving overall. With that being said the patient has been on Silvadene cream, Neosporin, and more recently soaking with peroxide then wet to dry gauze recommended by primary care provider. Lastly Santyl was advised but they have not been able to get that  filled as of yet. Patient does have a history of neuropathy, chronic kidney disease stage III, and hypertension along with chronic venous insufficiency otherwise she is fairly healthy and has nothing else that should affect her ability to heal. 01-14-2022 upon evaluation today patient appears to be doing well currently in regard to her wound. She has been tolerating the dressing changes without complication. With that being said she still has quite a bit of necrotic tissue buildup on the surface of the wound unfortunately. 01-21-2022 upon evaluation today patient actually is seeming to make excellent progress in regard to her wound. She is tolerating the dressing changes and the Iodoflex seems to be doing a great job at this point. Fortunately I do not see any signs of active infection at this time. 12/26; wound on the right lateral lower leg which was initially trauma is quite a bit better down to half a centimeter in both dimensions. It has no depth. We are using Iodoflex under 3 layer compression. On the left leg she has a skin tear we have been using Xeroform this also looks quite good. The patient probably has chronic venous insufficiency although she also takes prednisone 10 mg a day adding to the overall fragility of her skin 02-12-2022 upon evaluation today patient appears to be doing well currently in regard to her wound. She has been tolerating the dressing changes without complication. The right leg is looking better the left leg is not feeling quite as much as we would like to see. I think that she has some necrotic tissue that needs to be cleaned away to be honest. Fortunately I do not see any signs of active infection locally nor systemically at this time which is great news. 02-19-2021 upon evaluation today patient's wounds actually are showing signs of improvement which is great news. Fortunately I do not see any signs of active infection locally  or systemically which is great news. No  fevers, chills, nausea, vomiting, or diarrhea. Upon inspection patient's wound actually is showing signs of improvement although she tells me that the left leg did have more discomfort after last week although did have to perform a fairly significant debridement last week. 02-26-2022 upon evaluation today patient's wounds actually are showing signs of improvement they are not draining nearly as much but this means they also have become a little bit more dry in general and therefore the dressing material was sticking unfortunately. I do think that the collagen is done well but I believe on the right leg this is doing so well that possibly the switch of the Xeroform could be helpful on the left leg I think the collagen is still the better way to go to be honest but we may need to do something to try to keep this from drying out. 03-05-2022 upon evaluation today patient appears to be doing well currently in regard to her wound. She has been tolerating the dressing changes without complication. Fortunately there does not appear to be any signs of infection locally nor systemically the right leg looks healed the left leg looks better. 03-14-2022 upon evaluation today patient appears to be doing pretty well currently in regard to her wound. This in fact is showing signs of improvement the right leg is still close the left leg is looking better in fact there is not even a need for sharp debridement today which is good news as well she really did not want me to perform any debridement whatsoever. She already said that before even got a chance to look at the wound. 03-25-2022 upon evaluation today patient appears to be doing much better in regard to her leg ulcer. She is actually been tolerating the dressing changes without [name], Mianna L ([id number]) [id number] Page 2 of 6 complication and overall I am extremely pleased with where we stand today. 3/4; the patient has a small but  still open area on the distal wound on the left lower leg. Significant venous insufficiency. The patient is disappointed because the wound has not healed 05-07-2022 upon evaluation today patient actually appears to be completely healed which is great news. Fortunately I do not see any signs of active infection which is also excellent and I believe that she is making great progress here. Electronic Signature(s) Signed: 05/09/2022 8:18:21 PM By: Lenda KelpStone III, Becky Berberian PA-C Previous Signature: 05/09/2022 8:17:47 PM Version By: Lenda KelpStone III, Rechelle Niebla PA-C Entered By: Lenda KelpStone III, Matan Steen on 05/09/2022 08:65:7820:18:21 -------------------------------------------------------------------------------- Physical Exam Details Patient Name: Date of Service: [name], Yoko L. 05/07/2022 3:45 PM Medical Record Number: [id number] Patient Account Number: [id number] Date of Birth/Sex: Treating RN: 05/17/1931 (89 y.o. Esmeralda LinksF) Gordon, Caitlin Primary Care Provider: Lynnea FerrierPickard, Warren Other Clinician: Referring Provider: Treating Provider/Extender: Wilma FlavinStone, Cheyeanne Roadcap Pickard, Warren Weeks in Treatment: 17 Constitutional Well-nourished and well-hydrated in no acute distress. Respiratory normal breathing without difficulty. Psychiatric this patient is able to make decisions and demonstrates good insight into disease process. Alert and Oriented x 3. pleasant and cooperative. Notes Upon inspection patient's wound bed actually showed signs of good granulation epithelization at this point. Fortunately I do not see any evidence of infection locally or systemically which is great news and overall I am extremely pleased with where we stand. I think she is completely healed which is awesome. Electronic Signature(s) Signed: 05/09/2022 8:18:04 PM By: Lenda KelpStone III, Sheniqua Carolan PA-C Entered By: Lenda KelpStone III, Peytan Andringa on 05/09/2022 20:18:04 -------------------------------------------------------------------------------- Physician Orders Details Patient Name: Date of  Service: SUTTYN, CRYDER 05/07/2022 3:45 PM Medical Record Number: [id number] Patient Account Number: [id number] Date of Birth/Sex: Treating RN: [birth year] (89 y.o. Esmeralda Links Primary Care Provider: [name] Other Clinician: Referring Provider: Treating Provider/Extender: Trula Ore in Treatment: 17 Verbal / Phone Orders: No Diagnosis Coding ICD-10 Coding Code Description I87.331 Chronic venous hypertension (idiopathic) with ulcer and inflammation of right lower extremity G60.3 Idiopathic progressive neuropathy L97.812 Non-pressure chronic ulcer of other part of right lower leg with fat layer exposed L97.822 Non-pressure chronic ulcer of other part of left lower leg with fat layer exposed N18.30 Chronic kidney disease, stage 3 unspecified I10 Essential (primary) hypertension Discharge From [facility] Discharge from Wound Care Center Treatment Complete - Please call with any issues to healed wound Wear compression garments daily. Put garments on first thing when you wake up and remove them before bed. Elevate, Exercise Daily and Avoid Standing for Long Periods of Time. Electronic Signature(s) SEPHORA, BOYAR ([id number]) [id number] Page 3 of 6 Signed: 05/07/2022 4:27:12 PM By: Angelina Pih Signed: 05/10/2022 8:47:01 AM By: Lenda Kelp PA-C Entered By: Angelina Pih on 05/07/2022 16:20:10 -------------------------------------------------------------------------------- Problem List Details Patient Name: Date of Service: DAYJA, LOVERIDGE 05/07/2022 3:45 PM Medical Record Number: [id number] Patient Account Number: [id number] Date of Birth/Sex: Treating RN: Sep 13, 1931 (89 y.o. [name] Primary Care Provider: [name] Other Clinician: Betha Loa Referring Provider: Treating Provider/Extender: Trula Ore in Treatment: 17 Active Problems ICD-10 Encounter Code  Description Active Date MDM Diagnosis I87.331 Chronic venous hypertension (idiopathic) with ulcer and inflammation of right 01/07/2022 No Yes lower extremity G60.3 Idiopathic progressive neuropathy 01/07/2022 No Yes L97.812 Non-pressure chronic ulcer of other part of right lower leg with fat layer 01/07/2022 No Yes exposed L97.822 Non-pressure chronic ulcer of other part of left lower leg with fat layer exposed1/23/2024 No Yes N18.30 Chronic kidney disease, stage 3 unspecified 01/07/2022 No Yes I10 Essential (primary) hypertension 01/07/2022 No Yes Inactive Problems Resolved Problems Electronic Signature(s) Signed: 05/07/2022 3:50:54 PM By: Lenda Kelp PA-C Entered By: Lenda Kelp on 05/07/2022 15:50:54 -------------------------------------------------------------------------------- Progress Note Details Patient Name: Date of Service: MATIE, DIMAANO 05/07/2022 3:45 PM Medical Record Number: [id number] Patient Account Number: [id number] Date of Birth/Sex: Treating RN: 1931/04/22 (89 y.o. Esmeralda Links Primary Care Provider: [name] Other Clinician: Referring Provider: Treating Provider/Extender: Trula Ore in Treatment: 17 Subjective Chief Complaint Information obtained from Patient Right LE Ulcer WYNETTA, SEITH ([id number]) [id number] Page 4 of 6 History of Present Illness (HPI) 01-07-2022 upon evaluation today patient appears to be doing poorly in regard to the wound on the right lateral lower extremity. She tells me that this occurred after a fall that she sustained on and around Labor Day weekend. Subsequently following this fall she tells me that she had a wound that really has not improved significantly. She has been seeing her primary care provider and they have been attempting to do what they could to get this improving overall. With that being said the patient has been on Silvadene cream, Neosporin, and  more recently soaking with peroxide then wet to dry gauze recommended by primary care provider. Lastly Santyl was advised but they have not been able to get that filled as of yet. Patient does have a history of neuropathy, chronic kidney disease stage III, and hypertension along with chronic venous insufficiency otherwise she is fairly healthy and has nothing else that should affect her ability to heal. 01-14-2022  upon evaluation today patient appears to be doing well currently in regard to her wound. She has been tolerating the dressing changes without complication. With that being said she still has quite a bit of necrotic tissue buildup on the surface of the wound unfortunately. 01-21-2022 upon evaluation today patient actually is seeming to make excellent progress in regard to her wound. She is tolerating the dressing changes and the Iodoflex seems to be doing a great job at this point. Fortunately I do not see any signs of active infection at this time. 12/26; wound on the right lateral lower leg which was initially trauma is quite a bit better down to half a centimeter in both dimensions. It has no depth. We are using Iodoflex under 3 layer compression. On the left leg she has a skin tear we have been using Xeroform this also looks quite good. The patient probably has chronic venous insufficiency although she also takes prednisone 10 mg a day adding to the overall fragility of her skin 02-12-2022 upon evaluation today patient appears to be doing well currently in regard to her wound. She has been tolerating the dressing changes without complication. The right leg is looking better the left leg is not feeling quite as much as we would like to see. I think that she has some necrotic tissue that needs to be cleaned away to be honest. Fortunately I do not see any signs of active infection locally nor systemically at this time which is great news. 02-19-2021 upon evaluation today patient's wounds  actually are showing signs of improvement which is great news. Fortunately I do not see any signs of active infection locally or systemically which is great news. No fevers, chills, nausea, vomiting, or diarrhea. Upon inspection patient's wound actually is showing signs of improvement although she tells me that the left leg did have more discomfort after last week although did have to perform a fairly significant debridement last week. 02-26-2022 upon evaluation today patient's wounds actually are showing signs of improvement they are not draining nearly as much but this means they also have become a little bit more dry in general and therefore the dressing material was sticking unfortunately. I do think that the collagen is done well but I believe on the right leg this is doing so well that possibly the switch of the Xeroform could be helpful on the left leg I think the collagen is still the better way to go to be honest but we may need to do something to try to keep this from drying out. 03-05-2022 upon evaluation today patient appears to be doing well currently in regard to her wound. She has been tolerating the dressing changes without complication. Fortunately there does not appear to be any signs of infection locally nor systemically the right leg looks healed the left leg looks better. 03-14-2022 upon evaluation today patient appears to be doing pretty well currently in regard to her wound. This in fact is showing signs of improvement the right leg is still close the left leg is looking better in fact there is not even a need for sharp debridement today which is good news as well she really did not want me to perform any debridement whatsoever. She already said that before even got a chance to look at the wound. 03-25-2022 upon evaluation today patient appears to be doing much better in regard to her leg ulcer. She is actually been tolerating the dressing changes without complication and overall I am  extremely pleased with where we stand today. 3/4; the patient has a small but still open area on the distal wound on the left lower leg. Significant venous insufficiency. The patient is disappointed because the wound has not healed 05-07-2022 upon evaluation today patient actually appears to be completely healed which is great news. Fortunately I do not see any signs of active infection which is also excellent and I believe that she is making great progress here. Objective Constitutional Well-nourished and well-hydrated in no acute distress. Vitals Time Taken: 3:59 PM, Weight: 140 lbs, Temperature: 98.2 F, Pulse: 86 bpm, Respiratory Rate: 18 breaths/min, Blood Pressure: 148/59 mmHg. Respiratory normal breathing without difficulty. Psychiatric this patient is able to make decisions and demonstrates good insight into disease process. Alert and Oriented x 3. pleasant and cooperative. General Notes: Upon inspection patient's wound bed actually showed signs of good granulation epithelization at this point. Fortunately I do not see any evidence of infection locally or systemically which is great news and overall I am extremely pleased with where we stand. I think she is completely healed which is awesome. Integumentary (Hair, Skin) Wound #3 status is Open. Original cause of wound was Skin T ear/Laceration. The date acquired was: 01/21/2022. The wound has been in treatment 15 weeks. The wound is located on the Left,Lateral Lower Leg. The wound measures 0cm length x 0cm width x 0cm depth; 0cm^2 area and 0cm^3 volume. There is no tunneling or undermining noted. There is a none present amount of drainage noted. There is no granulation within the wound bed. There is no necrotic tissue within the wound bed. Assessment SHENA, VINLUAN ([id number]) [id number] Page 5 of 6 Active Problems ICD-10 Chronic venous hypertension (idiopathic) with ulcer and inflammation of right  lower extremity Idiopathic progressive neuropathy Non-pressure chronic ulcer of other part of right lower leg with fat layer exposed Non-pressure chronic ulcer of other part of left lower leg with fat layer exposed Chronic kidney disease, stage 3 unspecified Essential (primary) hypertension Plan Discharge From [facility] Services: Discharge from Wound Care Center Treatment Complete - Please call with any issues to healed wound Wear compression garments daily. Put garments on first thing when you wake up and remove them before bed. Elevate, Exercise Daily and Avoid Standing for Long Periods of Time. 1. I would recommend that we have the patient continue to monitor for any signs of infection. Obviously if anything changes she knows to let me know but right now I really feel like we have made some good progress I think she is completely healed I think she is ready for discharge. 2. I am going to recommend that she continue with her compression stockings I think that is can be of utmost importance going forward. Will see her back for follow-up visit as needed. Electronic Signature(s) Signed: 05/09/2022 8:18:39 PM By: Lenda Kelp PA-C Entered By: Lenda Kelp on 05/09/2022 20:18:38 -------------------------------------------------------------------------------- SuperBill Details Patient Name: Date of Service: EMMELY, BITTINGER 05/07/2022 Medical Record Number: [id number] Patient Account Number: [id number] Date of Birth/Sex: Treating RN: 04-Apr-1931 (89 y.o. Esmeralda Links Primary Care Provider: [name] Other Clinician: Referring Provider: Treating Provider/Extender: Trula Ore in Treatment: 17 Diagnosis Coding ICD-10 Codes Code Description [phone] Chronic venous hypertension (idiopathic) with ulcer and inflammation of right lower extremity G60.3 Idiopathic progressive neuropathy L97.812 Non-pressure chronic ulcer of other part of right lower leg with fat  layer exposed L97.822 Non-pressure chronic ulcer of other part of left lower leg with fat layer  exposed N18.30 Chronic kidney disease, stage 3 unspecified I10 Essential (primary) hypertension Facility Procedures : CPT4 Code: [id number] Description: [id number] - WOUND CARE VISIT-LEV 2 EST PT Modifier: Quantity: 1 Physician Procedures : CPT4 Code Description Modifier [id number] - WC PHYS LEVEL 3 - EST PT ICD-10 Diagnosis Description I87.331 Chronic venous hypertension (idiopathic) with ulcer and inflammation of right lower extremity G60.3 Idiopathic progressive neuropathy L97.812  Non-pressure chronic ulcer of other part of right lower leg with fat layer exposed L97.822 Non-pressure chronic ulcer of other part of left lower leg with fat layer exposed Quantity: 1 Electronic Signature(s) [name], Martisha L ([id number]) [id number] Page 6 of 6 Signed: 05/09/2022 8:19:00 PM By: Lenda KelpStone III, Kemari Mares PA-C Previous Signature: 05/07/2022 4:27:12 PM Version By: Angelina PihGordon, Caitlin Entered By: Lenda KelpStone III, Elizabth Palka on 05/09/2022 20:19:00

## 2022-05-27 ENCOUNTER — Ambulatory Visit: Payer: 59

## 2022-05-28 ENCOUNTER — Other Ambulatory Visit: Payer: Self-pay | Admitting: Family Medicine

## 2022-05-28 DIAGNOSIS — F5101 Primary insomnia: Secondary | ICD-10-CM

## 2022-05-28 NOTE — Telephone Encounter (Signed)
Unable to refill per protocol, Rx request is too soon.  Requested Prescriptions  Pending Prescriptions Disp Refills   ALPRAZolam (XANAX) 0.5 MG tablet [Pharmacy Med Name: ALPRAZOLAM 0.5MG  TABLET] 30 tablet 0    Sig: TAKE ONE TABLET BY MOUTH THREE TIMES A DAY AS NEEDED FOR ANXIETY     Not Delegated - Psychiatry: Anxiolytics/Hypnotics 2 Failed - 05/28/2022  3:56 PM      Failed - This refill cannot be delegated      Failed - Urine Drug Screen completed in last 360 days      Failed - Valid encounter within last 6 months    Recent Outpatient Visits           1 year ago PMR (polymyalgia rheumatica) (HCC)   [facility] Pickard, Priscille Heidelberg, MD   1 year ago PMR (polymyalgia rheumatica) (HCC)   [facility] Pickard, Priscille Heidelberg, MD   2 years ago PMR (polymyalgia rheumatica) (HCC)   [facility] Donita Brooks, MD   2 years ago Stage 3b chronic kidney disease (HCC)   [facility] Pickard, Priscille Heidelberg, MD   2 years ago Bilateral leg weakness   [facility] Pickard, Priscille Heidelberg, MD              Passed - Patient is not pregnant       amLODipine (NORVASC) 5 MG tablet [Pharmacy Med Name: AMLODIPINE BESYLATE  TABLET] 90 tablet 1    Sig: TAKE ONE TABLET BY MOUTH ONCE A DAY     Cardiovascular: Calcium Channel Blockers 2 Failed - 05/28/2022  3:56 PM      Failed - Last BP in normal range    BP Readings from Last 1 Encounters:  03/12/22 (!) 156/68         Failed - Valid encounter within last 6 months    Recent Outpatient Visits           1 year ago PMR (polymyalgia rheumatica) (HCC)   [facility] Pickard, Priscille Heidelberg, MD   1 year ago PMR (polymyalgia rheumatica) (HCC)   [facility] Pickard, Priscille Heidelberg, MD   2 years ago PMR (polymyalgia rheumatica) (HCC)   [facility] Donita Brooks, MD   2 years ago Stage 3b chronic kidney disease (HCC)   [facility] Donita Brooks, MD   2 years ago Bilateral leg weakness   [facility] Pickard, Priscille Heidelberg, MD              Passed - Last Heart Rate in normal range    Pulse Readings from Last 1 Encounters:  03/12/22 88

## 2022-05-28 NOTE — Telephone Encounter (Signed)
Requested medication (s) are due for refill today: yes  Requested medication (s) are on the active medication list: yes  Last refill:  05/02/22  Future visit scheduled: yes  Notes to clinic:  Unable to refill per protocol, cannot delegate.      Requested Prescriptions  Pending Prescriptions Disp Refills   ALPRAZolam (XANAX) 0.5 MG tablet [Pharmacy Med Name: ALPRAZOLAM 0.5MG  TABLET] 30 tablet 0    Sig: TAKE ONE TABLET BY MOUTH THREE TIMES A DAY AS NEEDED FOR ANXIETY     Not Delegated - Psychiatry: Anxiolytics/Hypnotics 2 Failed - 05/28/2022  3:56 PM      Failed - This refill cannot be delegated      Failed - Urine Drug Screen completed in last 360 days      Failed - Valid encounter within last 6 months    Recent Outpatient Visits           1 year ago PMR (polymyalgia rheumatica) (HCC)   [facility] Pickard, Priscille Heidelberg, MD   1 year ago PMR (polymyalgia rheumatica) (HCC)   [facility] Pickard, Priscille Heidelberg, MD   2 years ago PMR (polymyalgia rheumatica) (HCC)   [facility] Donita Brooks, MD   2 years ago Stage 3b chronic kidney disease (HCC)   [facility] Pickard, Priscille Heidelberg, MD   2 years ago Bilateral leg weakness   [facility] Pickard, Priscille Heidelberg, MD              Passed - Patient is not pregnant      Signed Prescriptions Disp Refills   amLODipine (NORVASC) 5 MG tablet 90 tablet 1    Sig: TAKE ONE TABLET BY MOUTH ONCE A DAY     Cardiovascular: Calcium Channel Blockers 2 Failed - 05/28/2022  3:56 PM      Failed - Last BP in normal range    BP Readings from Last 1 Encounters:  03/12/22 (!) 156/68         Failed - Valid encounter within last 6 months    Recent Outpatient Visits           1 year ago PMR (polymyalgia rheumatica) (HCC)   [facility] Pickard, Priscille Heidelberg, MD   1 year ago PMR (polymyalgia rheumatica) (HCC)   [facility] Pickard, Priscille Heidelberg, MD    2 years ago PMR (polymyalgia rheumatica) (HCC)   [facility] Donita Brooks, MD   2 years ago Stage 3b chronic kidney disease (HCC)   [facility] Donita Brooks, MD   2 years ago Bilateral leg weakness   [facility] Pickard, Priscille Heidelberg, MD              Passed - Last Heart Rate in normal range    Pulse Readings from Last 1 Encounters:  03/12/22 88

## 2022-05-29 ENCOUNTER — Other Ambulatory Visit: Payer: Self-pay

## 2022-06-10 ENCOUNTER — Other Ambulatory Visit: Payer: Self-pay | Admitting: Family Medicine

## 2022-06-10 DIAGNOSIS — F5101 Primary insomnia: Secondary | ICD-10-CM

## 2022-06-10 MED ORDER — ALPRAZOLAM 0.5 MG PO TABS: 0.5000 mg | ORAL_TABLET | Freq: Three times a day (TID) | ORAL | 0 refills | Status: DC | PRN

## 2022-06-10 MED ORDER — ALPRAZOLAM 0.5 MG PO TABS: 0.5000 mg | ORAL_TABLET | Freq: Three times a day (TID) | ORAL | 2 refills | Status: DC | PRN

## 2022-06-19 ENCOUNTER — Other Ambulatory Visit: Payer: Self-pay

## 2022-06-19 DIAGNOSIS — M8580 Other specified disorders of bone density and structure, unspecified site: Secondary | ICD-10-CM

## 2022-06-19 DIAGNOSIS — M810 Age-related osteoporosis without current pathological fracture: Secondary | ICD-10-CM

## 2022-06-20 ENCOUNTER — Ambulatory Visit (INDEPENDENT_AMBULATORY_CARE_PROVIDER_SITE_OTHER): Payer: 59 | Admitting: Family Medicine

## 2022-06-20 ENCOUNTER — Encounter: Payer: Self-pay | Admitting: Family Medicine

## 2022-06-20 VITALS — BP 120/62 | HR 82 | Temp 98.0°F | Ht 63.0 in | Wt 133.7 lb

## 2022-06-20 DIAGNOSIS — M6289 Other specified disorders of muscle: Secondary | ICD-10-CM

## 2022-06-20 DIAGNOSIS — Z96643 Presence of artificial hip joint, bilateral: Secondary | ICD-10-CM

## 2022-06-20 DIAGNOSIS — N1832 Chronic kidney disease, stage 3b: Secondary | ICD-10-CM | POA: Diagnosis not present

## 2022-06-20 DIAGNOSIS — M810 Age-related osteoporosis without current pathological fracture: Secondary | ICD-10-CM

## 2022-06-20 DIAGNOSIS — M5136 Other intervertebral disc degeneration, lumbar region: Secondary | ICD-10-CM

## 2022-06-20 DIAGNOSIS — M1712 Unilateral primary osteoarthritis, left knee: Secondary | ICD-10-CM

## 2022-06-20 DIAGNOSIS — F5101 Primary insomnia: Secondary | ICD-10-CM

## 2022-06-20 DIAGNOSIS — M51369 Other intervertebral disc degeneration, lumbar region without mention of lumbar back pain or lower extremity pain: Secondary | ICD-10-CM

## 2022-06-20 DIAGNOSIS — I10 Essential (primary) hypertension: Secondary | ICD-10-CM | POA: Diagnosis not present

## 2022-06-20 DIAGNOSIS — M1711 Unilateral primary osteoarthritis, right knee: Secondary | ICD-10-CM | POA: Diagnosis not present

## 2022-06-20 LAB — CBC WITH DIFFERENTIAL/PLATELET
Basophils Absolute: 38 cells/uL (ref 0–200)
Basophils Relative: 0.4 %
HCT: 38.4 % (ref 35.0–45.0)
Neutro Abs: 8047 cells/uL — ABNORMAL HIGH (ref 1500–7800)
Neutrophils Relative %: 84.7 %
RDW: 13.1 % (ref 11.0–15.0)

## 2022-06-20 MED ORDER — PREDNISONE 10 MG PO TABS: ORAL_TABLET | ORAL | 6 refills | Status: DC

## 2022-06-20 MED ORDER — TRAMADOL HCL 50 MG PO TABS
50.0000 mg | ORAL_TABLET | Freq: Four times a day (QID) | ORAL | 2 refills | Status: DC | PRN
Start: 2022-06-20 — End: 2022-11-29

## 2022-06-20 MED ORDER — ALPRAZOLAM 0.5 MG PO TABS: 0.5000 mg | ORAL_TABLET | Freq: Three times a day (TID) | ORAL | 2 refills | Status: DC | PRN

## 2022-06-20 MED ORDER — DENOSUMAB 60 MG/ML ~~LOC~~ SOSY
60.0000 mg | PREFILLED_SYRINGE | Freq: Once | SUBCUTANEOUS | Status: AC
Start: 2022-06-20 — End: 2022-06-20
  Administered 2022-06-20: 60 mg via SUBCUTANEOUS

## 2022-06-20 NOTE — Progress Notes (Signed)
Subjective:    Patient ID: [name], female    DOB: December 12, 1931, 89 y.o.   MRN: [id number] Patient has a history of hypertension, stage III chronic kidney disease, and chronic pain in her back, hips, and knees due to degenerative disc disease and osteoarthritis.  She is currently on prednisone 10 mg a day because she cannot tolerate the pain and she is unable to take NSAIDs due to her chronic kidney disease.  This manages her pain sufficiently.  She also uses tramadol twice a day for breakthrough pain and takes 1 Xanax at night to help her rest.  She denies any chest pain, shortness of breath, dyspnea on exertion.  However she is requesting a lift chair.  Due to the severe pain in her back, the weakness in her legs related to deconditioning, and advanced age, and the pain in her knees due to osteoarthritis, the patient is unable to stand from a seated position.  This puts her at high fall risk.  She would benefit from a lift chair to help get her to a standing position where she could then walk using a cane or a walker Past Medical History:  Diagnosis Date   Cataract    CKD (chronic kidney disease) stage 3, GFR 30-59 ml/min (HCC)    Fatty liver disease, nonalcoholic    2007- admitted with encephalopathy unsure of cause, resolved sponatneously   Hypertension    Past Surgical History:  Procedure Laterality Date   FRACTURE SURGERY     right shoulder, both wrists    JOINT REPLACEMENT     HIP- bilateral   Current Outpatient Medications on File Prior to Visit  Medication Sig Dispense Refill   ALPRAZolam (XANAX) 0.5 MG tablet Take 1 tablet (0.5 mg total) by mouth 3 (three) times daily as needed. for anxiety 30 tablet 2   amLODipine (NORVASC) 5 MG tablet TAKE ONE TABLET BY MOUTH ONCE A DAY 90 tablet 1   collagenase (SANTYL) 250 UNIT/GM ointment Apply 1 Application topically daily. 90 g 0   denosumab (PROLIA) 60 MG/ML SOSY injection Inject 60 mg into the skin every 6 (six) months.      diclofenac Sodium (VOLTAREN) 1 % GEL Apply topically 4 (four) times daily.     escitalopram (LEXAPRO) 10 MG tablet Take 1 tablet (10 mg total) by mouth daily. 90 tablet 3   losartan (COZAAR) 50 MG tablet Take 1 tablet (50 mg total) by mouth daily. 90 tablet 3   predniSONE (DELTASONE) 10 MG tablet TAKE ONE TABLET BY MOUTH ONCE A DAY WITH BREAKFAST 30 tablet 0   traMADol (ULTRAM) 50 MG tablet Take 1 tablet (50 mg total) by mouth every 6 (six) hours as needed. 120 tablet 2   No current facility-administered medications on file prior to visit.     Allergies  Allergen Reactions   Pneumococcal Vaccines Other (See Comments)    Pt had localized reaction to injection of Pneumococcal 23 - Swelling and redness in upper arm that extend to but not beyond elbow   Social History   Socioeconomic History   Marital status: Widowed    Spouse name: Not on file   Number of children: 1   Years of education: Not on file   Highest education level: Not on file  Occupational History   Not on file  Tobacco Use   Smoking status: Never   Smokeless tobacco: Never  Vaping Use   Vaping Use: Never used  Substance and Sexual Activity  Alcohol use: Not Currently   Drug use: Never   Sexual activity: Not Currently    Comment: raised tobacco, retired  Other Topics Concern   Not on file  Social History Narrative   Lives alone   Right handed    Caffeine: 1/2-1 cup coffee in AM   Social Determinants of Health   Financial Resource Strain: Low Risk  (04/09/2022)   Overall Financial Resource Strain (CARDIA)    Difficulty of Paying Living Expenses: Not hard at all  Food Insecurity: No Food Insecurity (04/09/2022)   Hunger Vital Sign    Worried About Running Out of Food in the Last Year: Never true    Ran Out of Food in the Last Year: Never true  Transportation Needs: No Transportation Needs (04/09/2022)   PRAPARE - Administrator, Civil Service (Medical): No    Lack of Transportation (Non-Medical): No   Physical Activity: Inactive (04/09/2022)   Exercise Vital Sign    Days of Exercise per Week: 0 days    Minutes of Exercise per Session: 0 min  Stress: No Stress Concern Present (04/09/2022)   [employer] of Occupational Health - Occupational Stress Questionnaire    Feeling of Stress : Not at all  Social Connections: Moderately Isolated (04/09/2022)   Social Connection and Isolation Panel [NHANES]    Frequency of Communication with Friends and Family: More than three times a week    Frequency of Social Gatherings with Friends and Family: More than three times a week    Attends Religious Services: 1 to 4 times per year    Active Member of [employer] or Organizations: No    Attends Banker Meetings: Never    Marital Status: Widowed  Intimate Partner Violence: Not At Risk (04/09/2022)   Humiliation, Afraid, Rape, and Kick questionnaire    Fear of Current or Ex-Partner: No    Emotionally Abused: No    Physically Abused: No    Sexually Abused: No     Review of Systems  All other systems reviewed and are negative.      Objective:   Physical Exam Vitals reviewed.  Constitutional:      General: She is not in acute distress.    Appearance: She is well-developed and normal weight. She is not ill-appearing, toxic-appearing or diaphoretic.  HENT:     Mouth/Throat:     Mouth: Mucous membranes are dry.     Pharynx: No oropharyngeal exudate or posterior oropharyngeal erythema.  Eyes:     Extraocular Movements: Extraocular movements intact.     Conjunctiva/sclera: Conjunctivae normal.  Cardiovascular:     Rate and Rhythm: Regular rhythm. Tachycardia present.     Heart sounds: Normal heart sounds. No murmur heard.    No friction rub. No gallop.  Pulmonary:     Effort: Pulmonary effort is normal. No respiratory distress.     Breath sounds: Normal breath sounds. No stridor. No wheezing or rales.  Abdominal:     General: Bowel sounds are normal. There is no distension.      Palpations: Abdomen is soft.     Tenderness: There is no abdominal tenderness. There is no guarding or rebound.  Musculoskeletal:     Lumbar back: Tenderness present. Decreased range of motion.     Right hip: Decreased range of motion.     Left hip: Decreased range of motion.     Right knee: Decreased range of motion. No MCL or LCL tenderness.     Left  knee: Decreased range of motion.     Right lower leg: No edema.     Left lower leg: No edema.  Lymphadenopathy:     Cervical: No cervical adenopathy.  Skin:    Findings: No erythema.  Neurological:     General: No focal deficit present.     Mental Status: She is alert and oriented to person, place, and time.     Cranial Nerves: No cranial nerve deficit.     Motor: No weakness.     Coordination: Coordination normal.     Gait: Gait normal.     Deep Tendon Reflexes: Reflexes normal.  Psychiatric:        Attention and Perception: Attention and perception normal.        Mood and Affect: Mood is not elated. Affect is not labile, blunt, flat, angry or tearful.        Speech: Speech normal.        Behavior: Behavior is not agitated, slowed, aggressive, withdrawn, hyperactive or combative.         Assessment & Plan:  Benign essential HTN - Plan: CBC with Differential/Platelet, COMPLETE METABOLIC PANEL WITH GFR  Stage 3b chronic kidney disease (HCC)  Proximal weakness of extremity  Primary osteoarthritis of right knee  Unilateral primary osteoarthritis, left knee  Hx of total hip arthroplasty, bilateral I am very happy with her blood pressure today.  Check CBC and CMP to monitor chronic kidney disease.  Will continue to use prednisone 10 mg a day to help with her chronic pain due to her osteoarthritis in both knees, her osteoarthritis in both hips, and her low back pain.  She can also use tramadol twice a day for breakthrough pain and continue to use Xanax 1 tablet at night to help her sleep.  I will write a prescription for a  mechanical lift chair as the patient has weakness in her legs and pain in her lower back, hips, and knees.  This makes it difficult for her to rise to a standing position

## 2022-06-20 NOTE — Addendum Note (Signed)
Addended by: Venia Carbon K on: 06/20/2022 01:01 PM   Modules accepted: Orders

## 2022-06-21 LAB — CBC WITH DIFFERENTIAL/PLATELET
Absolute Monocytes: 409 cells/uL (ref 200–950)
Eosinophils Absolute: 10 cells/uL — ABNORMAL LOW (ref 15–500)
Eosinophils Relative: 0.1 %
Hemoglobin: 12.7 g/dL (ref 11.7–15.5)
Lymphs Abs: 998 cells/uL (ref 850–3900)
MCH: 30.7 pg (ref 27.0–33.0)
MCHC: 33.1 g/dL (ref 32.0–36.0)
MCV: 92.8 fL (ref 80.0–100.0)
MPV: 10.9 fL (ref 7.5–12.5)
Monocytes Relative: 4.3 %
Platelets: 233 10*3/uL (ref 140–400)
RBC: 4.14 10*6/uL (ref 3.80–5.10)
Total Lymphocyte: 10.5 %
WBC: 9.5 10*3/uL (ref 3.8–10.8)

## 2022-06-21 LAB — COMPLETE METABOLIC PANEL WITH GFR
AG Ratio: 1.9 (calc) (ref 1.0–2.5)
ALT: 14 U/L (ref 6–29)
AST: 17 U/L (ref 10–35)
Albumin: 4.2 g/dL (ref 3.6–5.1)
Alkaline phosphatase (APISO): 68 U/L (ref 37–153)
BUN/Creatinine Ratio: 25 (calc) — ABNORMAL HIGH (ref 6–22)
BUN: 42 mg/dL — ABNORMAL HIGH (ref 7–25)
CO2: 23 mmol/L (ref 20–32)
Calcium: 10 mg/dL (ref 8.6–10.4)
Chloride: 104 mmol/L (ref 98–110)
Creat: 1.67 mg/dL — ABNORMAL HIGH (ref 0.60–0.95)
Globulin: 2.2 g/dL (calc) (ref 1.9–3.7)
Glucose, Bld: 113 mg/dL — ABNORMAL HIGH (ref 65–99)
Potassium: 5 mmol/L (ref 3.5–5.3)
Sodium: 138 mmol/L (ref 135–146)
Total Bilirubin: 0.5 mg/dL (ref 0.2–1.2)
Total Protein: 6.4 g/dL (ref 6.1–8.1)
eGFR: 29 mL/min/{1.73_m2} — ABNORMAL LOW (ref >=60)

## 2022-10-21 ENCOUNTER — Other Ambulatory Visit: Payer: 59

## 2022-10-31 ENCOUNTER — Ambulatory Visit (INDEPENDENT_AMBULATORY_CARE_PROVIDER_SITE_OTHER): Payer: 59 | Admitting: Family Medicine

## 2022-10-31 VITALS — BP 120/72 | HR 85 | Temp 98.1°F | Ht 63.0 in | Wt 140.8 lb

## 2022-10-31 DIAGNOSIS — N1832 Chronic kidney disease, stage 3b: Secondary | ICD-10-CM

## 2022-10-31 DIAGNOSIS — I10 Essential (primary) hypertension: Secondary | ICD-10-CM

## 2022-10-31 DIAGNOSIS — R350 Frequency of micturition: Secondary | ICD-10-CM

## 2022-10-31 DIAGNOSIS — Z23 Encounter for immunization: Secondary | ICD-10-CM | POA: Diagnosis not present

## 2022-10-31 DIAGNOSIS — F5101 Primary insomnia: Secondary | ICD-10-CM | POA: Diagnosis not present

## 2022-10-31 LAB — CBC WITH DIFFERENTIAL/PLATELET
Absolute Monocytes: 902 cells/uL (ref 200–950)
Basophils Absolute: 47 cells/uL (ref 0–200)
Basophils Relative: 0.5 %
Eosinophils Absolute: 93 cells/uL (ref 15–500)
Eosinophils Relative: 1 %
HCT: 36.2 % (ref 35.0–45.0)
Hemoglobin: 11.8 g/dL (ref 11.7–15.5)
Lymphs Abs: 3004 cells/uL (ref 850–3900)
MCH: 30.9 pg (ref 27.0–33.0)
MCHC: 32.6 g/dL (ref 32.0–36.0)
MCV: 94.8 fL (ref 80.0–100.0)
MPV: 10 fL (ref 7.5–12.5)
Monocytes Relative: 9.7 %
Neutro Abs: 5255 cells/uL (ref 1500–7800)
Neutrophils Relative %: 56.5 %
Platelets: 210 10*3/uL (ref 140–400)
RBC: 3.82 10*6/uL (ref 3.80–5.10)
RDW: 12.6 % (ref 11.0–15.0)
Total Lymphocyte: 32.3 %
WBC: 9.3 10*3/uL (ref 3.8–10.8)

## 2022-10-31 MED ORDER — ALPRAZOLAM 0.5 MG PO TABS: 0.5000 mg | ORAL_TABLET | Freq: Three times a day (TID) | ORAL | 2 refills | Status: DC | PRN

## 2022-10-31 NOTE — Addendum Note (Signed)
Addended by: Venia Carbon K on: 10/31/2022 02:49 PM   Modules accepted: Orders

## 2022-10-31 NOTE — Progress Notes (Signed)
Subjective:    Patient ID: [name], female    DOB: [birth year], 89 y.o.   MRN: [id number] Patient has a history of hypertension, stage III chronic kidney disease, and chronic pain in her back, hips, and knees due to degenerative disc disease and osteoarthritis.  She is currently on prednisone 10 mg a day because she cannot tolerate the pain and she is unable to take NSAIDs due to her chronic kidney disease.  This manages her pain sufficiently.  She also uses tramadol twice a day for breakthrough pain and takes 1 Xanax at night to help her rest.  She is requesting a refill of Xanax today.  She denies any angina.  She denies any shortness of breath.  Her blood pressure today is excellent.  She does have severe limitations in her mobility due to her knee pain, her left posterior hip pain, and her low back pain.  She has a difficult time performing activities of daily living around the home however she is still able to cook and clean and feed herself and bathe.  She denies any memory loss.  She denies any confusion.  She is due for flu shot today. Past Medical History:  Diagnosis Date   Cataract    CKD (chronic kidney disease) stage 3, GFR 30-59 ml/min (HCC)    Fatty liver disease, nonalcoholic    2007- admitted with encephalopathy unsure of cause, resolved sponatneously   Hypertension    Past Surgical History:  Procedure Laterality Date   FRACTURE SURGERY     right shoulder, both wrists    JOINT REPLACEMENT     HIP- bilateral   Current Outpatient Medications on File Prior to Visit  Medication Sig Dispense Refill   ALPRAZolam (XANAX) 0.5 MG tablet Take 1 tablet (0.5 mg total) by mouth 3 (three) times daily as needed. for anxiety 30 tablet 2   amLODipine (NORVASC) 5 MG tablet TAKE ONE TABLET BY MOUTH ONCE A DAY 90 tablet 1   collagenase (SANTYL) 250 UNIT/GM ointment Apply 1 Application topically daily. 90 g 0   denosumab (PROLIA) 60 MG/ML SOSY injection Inject 60 mg into the skin every  6 (six) months.     diclofenac Sodium (VOLTAREN) 1 % GEL Apply topically 4 (four) times daily.     escitalopram (LEXAPRO) 10 MG tablet Take 1 tablet (10 mg total) by mouth daily. 90 tablet 3   losartan (COZAAR) 50 MG tablet Take 1 tablet (50 mg total) by mouth daily. 90 tablet 3   predniSONE (DELTASONE) 10 MG tablet TAKE ONE TABLET BY MOUTH ONCE A DAY WITH BREAKFAST 30 tablet 6   traMADol (ULTRAM) 50 MG tablet Take 1 tablet (50 mg total) by mouth every 6 (six) hours as needed. 120 tablet 2   No current facility-administered medications on file prior to visit.     Allergies  Allergen Reactions   Pneumococcal Vaccines Other (See Comments)    Pt had localized reaction to injection of Pneumococcal 23 - Swelling and redness in upper arm that extend to but not beyond elbow   Social History   Socioeconomic History   Marital status: Widowed    Spouse name: Not on file   Number of children: 1   Years of education: Not on file   Highest education level: Not on file  Occupational History   Not on file  Tobacco Use   Smoking status: Never   Smokeless tobacco: Never  Vaping Use   Vaping status: Never Used  Substance and Sexual Activity   Alcohol use: Not Currently   Drug use: Never   Sexual activity: Not Currently    Comment: raised tobacco, retired  Other Topics Concern   Not on file  Social History Narrative   Lives alone   Right handed    Caffeine: 1/2-1 cup coffee in AM   Social Determinants of Health   Financial Resource Strain: Low Risk  (04/09/2022)   Overall Financial Resource Strain (CARDIA)    Difficulty of Paying Living Expenses: Not hard at all  Food Insecurity: No Food Insecurity (04/09/2022)   Hunger Vital Sign    Worried About Running Out of Food in the Last Year: Never true    Ran Out of Food in the Last Year: Never true  Transportation Needs: No Transportation Needs (04/09/2022)   PRAPARE - Administrator, Civil Service (Medical): No    Lack of  Transportation (Non-Medical): No  Physical Activity: Inactive (04/09/2022)   Exercise Vital Sign    Days of Exercise per Week: 0 days    Minutes of Exercise per Session: 0 min  Stress: No Stress Concern Present (04/09/2022)   [employer] of Occupational Health - Occupational Stress Questionnaire    Feeling of Stress : Not at all  Social Connections: Moderately Isolated (04/09/2022)   Social Connection and Isolation Panel [NHANES]    Frequency of Communication with Friends and Family: More than three times a week    Frequency of Social Gatherings with Friends and Family: More than three times a week    Attends Religious Services: 1 to 4 times per year    Active Member of [employer] or Organizations: No    Attends Banker Meetings: Never    Marital Status: Widowed  Intimate Partner Violence: Not At Risk (04/09/2022)   Humiliation, Afraid, Rape, and Kick questionnaire    Fear of Current or Ex-Partner: No    Emotionally Abused: No    Physically Abused: No    Sexually Abused: No     Review of Systems  All other systems reviewed and are negative.      Objective:   Physical Exam Vitals reviewed.  Constitutional:      General: She is not in acute distress.    Appearance: She is well-developed and normal weight. She is not ill-appearing, toxic-appearing or diaphoretic.  HENT:     Mouth/Throat:     Mouth: Mucous membranes are dry.     Pharynx: No oropharyngeal exudate or posterior oropharyngeal erythema.  Eyes:     Extraocular Movements: Extraocular movements intact.     Conjunctiva/sclera: Conjunctivae normal.  Cardiovascular:     Rate and Rhythm: Regular rhythm. Tachycardia present.     Heart sounds: Normal heart sounds. No murmur heard.    No friction rub. No gallop.  Pulmonary:     Effort: Pulmonary effort is normal. No respiratory distress.     Breath sounds: Normal breath sounds. No stridor. No wheezing or rales.  Abdominal:     General: Bowel sounds are normal.  There is no distension.     Palpations: Abdomen is soft.     Tenderness: There is no abdominal tenderness. There is no guarding or rebound.  Musculoskeletal:     Lumbar back: Tenderness present. Decreased range of motion.     Right hip: Decreased range of motion.     Left hip: Decreased range of motion.     Right knee: Decreased range of motion. No MCL or LCL  tenderness.     Left knee: Decreased range of motion.     Right lower leg: No edema.     Left lower leg: No edema.  Lymphadenopathy:     Cervical: No cervical adenopathy.  Skin:    Findings: No erythema.  Neurological:     General: No focal deficit present.     Mental Status: She is alert and oriented to person, place, and time.     Cranial Nerves: No cranial nerve deficit.     Motor: No weakness.     Coordination: Coordination normal.     Gait: Gait normal.     Deep Tendon Reflexes: Reflexes normal.  Psychiatric:        Attention and Perception: Attention and perception normal.        Mood and Affect: Mood is not elated. Affect is not labile, blunt, flat, angry or tearful.        Speech: Speech normal.        Behavior: Behavior is not agitated, slowed, aggressive, withdrawn, hyperactive or combative.         Assessment & Plan:  Stage 3b chronic kidney disease (HCC) - Plan: CBC with Differential/Platelet, COMPLETE METABOLIC PANEL WITH GFR  Benign essential HTN - Plan: CBC with Differential/Platelet, COMPLETE METABOLIC PANEL WITH GFR  Primary insomnia - Plan: ALPRAZolam (XANAX) 0.5 MG tablet I am very happy with her blood pressure.  I will check baseline lab work today while the patient is here to monitor kidney function.  Continue to avoid NSAIDs.  I did refill her Xanax which she uses sparingly for insomnia.  I will be happy to refill her tramadol as needed that she takes for her hip pain, chronic bilateral knee pain, and degenerative disc disease in her lower back.

## 2022-11-01 ENCOUNTER — Encounter: Payer: Self-pay | Admitting: Family Medicine

## 2022-11-01 ENCOUNTER — Other Ambulatory Visit: Payer: Self-pay | Admitting: Family Medicine

## 2022-11-01 LAB — URINALYSIS, ROUTINE W REFLEX MICROSCOPIC
Bilirubin Urine: NEGATIVE
Glucose, UA: NEGATIVE
Hgb urine dipstick: NEGATIVE
Hyaline Cast: NONE SEEN /[LPF]
Ketones, ur: NEGATIVE
Nitrite: POSITIVE — AB
Protein, ur: NEGATIVE
RBC / HPF: NONE SEEN /[HPF] (ref 0–2)
Specific Gravity, Urine: 1.013 (ref 1.001–1.035)
Squamous Epithelial / HPF: NONE SEEN /[HPF] (ref <=5)
WBC, UA: >=60 /[HPF] — AB (ref 0–5)
pH: 5.5 (ref 5.0–8.0)

## 2022-11-01 LAB — COMPLETE METABOLIC PANEL WITH GFR
AG Ratio: 1.9 (calc) (ref 1.0–2.5)
ALT: 15 U/L (ref 6–29)
AST: 16 U/L (ref 10–35)
Albumin: 4 g/dL (ref 3.6–5.1)
Alkaline phosphatase (APISO): 44 U/L (ref 37–153)
BUN/Creatinine Ratio: 23 (calc) — ABNORMAL HIGH (ref 6–22)
BUN: 40 mg/dL — ABNORMAL HIGH (ref 7–25)
CO2: 28 mmol/L (ref 20–32)
Calcium: 9 mg/dL (ref 8.6–10.4)
Chloride: 105 mmol/L (ref 98–110)
Creat: 1.74 mg/dL — ABNORMAL HIGH (ref 0.60–0.95)
Globulin: 2.1 g/dL (ref 1.9–3.7)
Glucose, Bld: 82 mg/dL (ref 65–99)
Potassium: 5 mmol/L (ref 3.5–5.3)
Sodium: 138 mmol/L (ref 135–146)
Total Bilirubin: 0.4 mg/dL (ref 0.2–1.2)
Total Protein: 6.1 g/dL (ref 6.1–8.1)
eGFR: 28 mL/min/{1.73_m2} — ABNORMAL LOW (ref >=60)

## 2022-11-01 LAB — MICROSCOPIC MESSAGE

## 2022-11-01 MED ORDER — CEPHALEXIN 500 MG PO CAPS: 500.0000 mg | ORAL_CAPSULE | Freq: Three times a day (TID) | ORAL | 0 refills | Status: DC

## 2022-11-01 NOTE — Addendum Note (Signed)
Addended by: Venia Carbon K on: 11/01/2022 08:41 AM   Modules accepted: Orders

## 2022-11-04 ENCOUNTER — Telehealth: Payer: Self-pay | Admitting: Family Medicine

## 2022-11-04 NOTE — Telephone Encounter (Signed)
Patient called to follow up on UTI; stated she prescription hasn't been called in to pharmacy.   Requested pharmacy confirmed as:  [facility] [address], [place] - [address] [place] [address] Phone: [phone]  Fax: [phone]   Please advise at [phone].

## 2022-11-18 ENCOUNTER — Telehealth: Payer: Self-pay

## 2022-11-18 NOTE — Telephone Encounter (Signed)
Pt called and states that since having Flu vaccine last Thursday, she is having issues with shortness of breath with activity.  I tried to have patient make an appointment to be see but she wanted me to talk with you first. Thank you.

## 2022-11-19 ENCOUNTER — Ambulatory Visit: Payer: Self-pay | Admitting: *Deleted

## 2022-11-19 ENCOUNTER — Ambulatory Visit: Payer: Self-pay

## 2022-11-19 NOTE — Telephone Encounter (Signed)
Spoke with pt's son, Harvie Heck, and encouraged them to take pt to ER to be evaluated. Harvie Heck states they have tried to get pt to go to the ER but she has refused. Curlene Labrum if sx become worse to call 911. Harvie Heck verbalized understanding of all. Mjp,lpn

## 2022-11-19 NOTE — Telephone Encounter (Signed)
  Chief Complaint: SOB Symptoms: SOB with exertion, "Better after resting for an hour or so." Son states SOB has been worsening past several days. "She's felt bad since flu shot last Thursday." Able to lie flat at HS.  Frequency: Worsening over several days. Pertinent Negatives: Patient denies wheezing, edema. "Swelling no worse than usual."  Disposition: [] ED /[] Urgent Care (no appt availability in office) / [] Appointment(In office/virtual)/ []  [place] [facility]/ [] Home Care/ [x] Refused Recommended Disposition /[] [place] [facility]/ []  Follow-up with PCP Additional Notes:  Pt's speech faltering during call. Pt declines ED. Appt made for tomorrow prior to triage. Assured pt's son Harvie Heck NT would route to practice for PCPs review. States if needed please call his cell: [phone]  Reason for Disposition  [1] MILD difficulty breathing (e.g., minimal/no SOB at rest, SOB with walking, pulse <100) AND [2] NEW-onset or WORSE than normal    SOB with exertion, resolves after resting  Answer Assessment - Initial Assessment Questions 1. RESPIRATORY STATUS: "Describe your breathing?" (e.g., wheezing, shortness of breath, unable to speak, severe coughing)      SOB with exertion, worsening past several days 2. ONSET: "When did this breathing problem begin?"      With exertion 3. PATTERN "Does the difficult breathing come and go, or has it been constant since it started?"      Varies 4. SEVERITY: "How bad is your breathing?" (e.g., mild, moderate, severe)    - MILD: No SOB at rest, mild SOB with walking, speaks normally in sentences, can lie down, no retractions, pulse < 100.    - MODERATE: SOB at rest, SOB with minimal exertion and prefers to sit, cannot lie down flat, speaks in phrases, mild retractions, audible wheezing, pulse 100-120.    - SEVERE: Very SOB at rest, speaks in single words, struggling to breathe, sitting hunched forward, retractions, pulse > 120      Moderate 5. RECURRENT  SYMPTOM: "Have you had difficulty breathing before?" If Yes, ask: "When was the last time?" and "What happened that time?"      Yes 6. CARDIAC HISTORY: "Do you have any history of heart disease?" (e.g., heart attack, angina, bypass surgery, angioplasty)       7. LUNG HISTORY: "Do you have any history of lung disease?"  (e.g., pulmonary embolus, asthma, emphysema)      8. CAUSE: "What do you think is causing the breathing problem?"      Unsure 9. OTHER SYMPTOMS: "Do you have any other symptoms? (e.g., dizziness, runny nose, cough, chest pain, fever)     Not today, did yesterday.  After flu shot last Thursday 10. O2 SATURATION MONITOR:  "Do you use an oxygen saturation monitor (pulse oximeter) at home?" If Yes, ask: "What is your reading (oxygen level) today?" "What is your usual oxygen saturation reading?" (e.g., 95%)  Protocols used: Breathing Difficulty-A-AH

## 2022-11-19 NOTE — Telephone Encounter (Signed)
  Chief Complaint: Difficulty breathing, weakness - feels poorly Symptoms: above Frequency: Since the weekend Pertinent Negatives: Patient denies  Disposition: [x] ED /[] Urgent Care (no appt availability in office) / [] Appointment(In office/virtual)/ []  [place] [facility]/ [] Home Care/ [] Refused Recommended Disposition /[] [place] [facility]/ []  Follow-up with PCP Additional Notes: Spoke with daughter in law Vickie. Vickie states that pt is having a lot of trouble breathing, and is very weak. Pt has refused the ED or EMS. She wants to have an OV. Advised Vickie that this was not going to improve on it's own. Vickie will call EMS or take pt to ED.    Reason for Disposition  Sounds like a life-threatening emergency to the triager  Answer Assessment - Initial Assessment Questions 1. RESPIRATORY STATUS: "Describe your breathing?" (e.g., wheezing, shortness of breath, unable to speak, severe coughing)      Very SOB 2. ONSET: "When did this breathing problem begin?"      Over the weekend  - very waek 3. PATTERN "Does the difficult breathing come and go, or has it been constant since it started?"      constant 4. SEVERITY: "How bad is your breathing?" (e.g., mild, moderate, severe)    - MILD: No SOB at rest, mild SOB with walking, speaks normally in sentences, can lie down, no retractions, pulse < 100.    - MODERATE: SOB at rest, SOB with minimal exertion and prefers to sit, cannot lie down flat, speaks in phrases, mild retractions, audible wheezing, pulse 100-120.    - SEVERE: Very SOB at rest, speaks in single words, struggling to breathe, sitting hunched forward, retractions, pulse > 120      severe  Protocols used: Breathing Difficulty-A-AH

## 2022-11-20 ENCOUNTER — Inpatient Hospital Stay (HOSPITAL_BASED_OUTPATIENT_CLINIC_OR_DEPARTMENT_OTHER)
Admission: EM | Admit: 2022-11-20 | Discharge: 2022-11-23 | DRG: 175 | Disposition: A | Discharge status: Home / Self Care | Payer: 59 | Attending: Internal Medicine | Admitting: Internal Medicine

## 2022-11-20 ENCOUNTER — Ambulatory Visit: Payer: 59 | Admitting: Family Medicine

## 2022-11-20 ENCOUNTER — Other Ambulatory Visit: Payer: Self-pay

## 2022-11-20 ENCOUNTER — Emergency Department (HOSPITAL_BASED_OUTPATIENT_CLINIC_OR_DEPARTMENT_OTHER): Payer: 59 | Admitting: Radiology

## 2022-11-20 ENCOUNTER — Encounter: Payer: Self-pay | Admitting: Family Medicine

## 2022-11-20 VITALS — BP 112/60 | HR 84 | Temp 97.5°F | Ht 63.0 in | Wt 134.0 lb

## 2022-11-20 DIAGNOSIS — N179 Acute kidney failure, unspecified: Secondary | ICD-10-CM | POA: Diagnosis present

## 2022-11-20 DIAGNOSIS — I82432 Acute embolism and thrombosis of left popliteal vein: Secondary | ICD-10-CM | POA: Diagnosis present

## 2022-11-20 DIAGNOSIS — N189 Chronic kidney disease, unspecified: Secondary | ICD-10-CM

## 2022-11-20 DIAGNOSIS — R0602 Shortness of breath: Secondary | ICD-10-CM | POA: Insufficient documentation

## 2022-11-20 DIAGNOSIS — I10 Essential (primary) hypertension: Secondary | ICD-10-CM | POA: Diagnosis present

## 2022-11-20 DIAGNOSIS — N184 Chronic kidney disease, stage 4 (severe): Secondary | ICD-10-CM | POA: Diagnosis present

## 2022-11-20 DIAGNOSIS — Z7952 Long term (current) use of systemic steroids: Secondary | ICD-10-CM | POA: Diagnosis not present

## 2022-11-20 DIAGNOSIS — M170 Bilateral primary osteoarthritis of knee: Secondary | ICD-10-CM | POA: Diagnosis not present

## 2022-11-20 DIAGNOSIS — K760 Fatty (change of) liver, not elsewhere classified: Secondary | ICD-10-CM | POA: Diagnosis not present

## 2022-11-20 DIAGNOSIS — I2489 Other forms of acute ischemic heart disease: Secondary | ICD-10-CM | POA: Diagnosis present

## 2022-11-20 DIAGNOSIS — J9601 Acute respiratory failure with hypoxia: Principal | ICD-10-CM | POA: Diagnosis present

## 2022-11-20 DIAGNOSIS — Z800 Family history of malignant neoplasm of digestive organs: Secondary | ICD-10-CM | POA: Diagnosis not present

## 2022-11-20 DIAGNOSIS — I129 Hypertensive chronic kidney disease with stage 1 through stage 4 chronic kidney disease, or unspecified chronic kidney disease: Secondary | ICD-10-CM | POA: Diagnosis present

## 2022-11-20 DIAGNOSIS — I82442 Acute embolism and thrombosis of left tibial vein: Secondary | ICD-10-CM | POA: Diagnosis not present

## 2022-11-20 DIAGNOSIS — I82412 Acute embolism and thrombosis of left femoral vein: Secondary | ICD-10-CM | POA: Diagnosis not present

## 2022-11-20 DIAGNOSIS — I2602 Saddle embolus of pulmonary artery with acute cor pulmonale: Secondary | ICD-10-CM | POA: Diagnosis not present

## 2022-11-20 DIAGNOSIS — Z79899 Other long term (current) drug therapy: Secondary | ICD-10-CM | POA: Diagnosis not present

## 2022-11-20 DIAGNOSIS — Z8249 Family history of ischemic heart disease and other diseases of the circulatory system: Secondary | ICD-10-CM | POA: Diagnosis not present

## 2022-11-20 DIAGNOSIS — Z887 Allergy status to serum and vaccine status: Secondary | ICD-10-CM

## 2022-11-20 DIAGNOSIS — J9811 Atelectasis: Secondary | ICD-10-CM | POA: Diagnosis not present

## 2022-11-20 DIAGNOSIS — I2609 Other pulmonary embolism with acute cor pulmonale: Secondary | ICD-10-CM | POA: Diagnosis not present

## 2022-11-20 DIAGNOSIS — R0609 Other forms of dyspnea: Secondary | ICD-10-CM | POA: Diagnosis not present

## 2022-11-20 DIAGNOSIS — I771 Stricture of artery: Secondary | ICD-10-CM | POA: Diagnosis not present

## 2022-11-20 DIAGNOSIS — F39 Unspecified mood [affective] disorder: Secondary | ICD-10-CM | POA: Diagnosis present

## 2022-11-20 LAB — TROPONIN I (HIGH SENSITIVITY)
Troponin I (High Sensitivity): 166 ng/L (ref <18)
Troponin I (High Sensitivity): 164 ng/L (ref <18)

## 2022-11-20 LAB — CBC
HCT: 42.2 % (ref 36.0–46.0)
Hemoglobin: 13.6 g/dL (ref 12.0–15.0)
MCH: 30.8 pg (ref 26.0–34.0)
MCHC: 32.2 g/dL (ref 30.0–36.0)
MCV: 95.5 fL (ref 80.0–100.0)
Platelets: 118 10*3/uL — ABNORMAL LOW (ref 150–400)
RBC: 4.42 MIL/uL (ref 3.87–5.11)
RDW: 14.2 % (ref 11.5–15.5)
WBC: 8.5 10*3/uL (ref 4.0–10.5)
nRBC: 0 % (ref 0.0–0.2)

## 2022-11-20 LAB — BASIC METABOLIC PANEL
Anion gap: 15 (ref 5–15)
BUN: 59 mg/dL — ABNORMAL HIGH (ref 8–23)
CO2: 21 mmol/L — ABNORMAL LOW (ref 22–32)
Calcium: 9.5 mg/dL (ref 8.9–10.3)
Chloride: 101 mmol/L (ref 98–111)
Creatinine, Ser: 2.62 mg/dL — ABNORMAL HIGH (ref 0.44–1.00)
GFR, Estimated: 17 mL/min — ABNORMAL LOW (ref >60)
Glucose, Bld: 126 mg/dL — ABNORMAL HIGH (ref 70–99)
Potassium: 4.9 mmol/L (ref 3.5–5.1)
Sodium: 137 mmol/L (ref 135–145)

## 2022-11-20 LAB — PROTIME-INR
INR: 1.1 (ref 0.8–1.2)
Prothrombin Time: 14.8 s (ref 11.4–15.2)

## 2022-11-20 LAB — D-DIMER, QUANTITATIVE: D-Dimer, Quant: >20 ug{FEU}/mL — ABNORMAL HIGH (ref 0.00–0.50)

## 2022-11-20 LAB — APTT: aPTT: 31 s (ref 24–36)

## 2022-11-20 MED ORDER — SODIUM CHLORIDE 0.9 % IV BOLUS
1000.0000 mL | Freq: Once | INTRAVENOUS | Status: AC
  Administered 2022-11-20: 1000 mL via INTRAVENOUS

## 2022-11-20 MED ORDER — HEPARIN (PORCINE) 25000 UT/250ML-% IV SOLN
1000.0000 [IU]/h | INTRAVENOUS | Status: DC
  Administered 2022-11-20: 1000 [IU]/h via INTRAVENOUS
  Filled 2022-11-20: qty 250

## 2022-11-20 MED ORDER — HEPARIN BOLUS VIA INFUSION
4000.0000 [IU] | Freq: Once | INTRAVENOUS | Status: AC
  Administered 2022-11-20: 4000 [IU] via INTRAVENOUS

## 2022-11-20 NOTE — Progress Notes (Signed)
ANTICOAGULATION CONSULT NOTE - Initial Consult  Pharmacy Consult for Heparin Indication:  Suspected PE  Allergies  Allergen Reactions   Pneumococcal Vaccines Other (See Comments)    Pt had localized reaction to injection of Pneumococcal 23 - Swelling and redness in upper arm that extend to but not beyond elbow    Patient Measurements:   Heparin Dosing Weight: 60.8 kg  Vital Signs: Temp: 97.4 F (36.3 C) (10/16 1710) Temp Source: Oral (10/16 1710) BP: 128/77 (10/16 2000) Pulse Rate: 78 (10/16 2000)  Labs: Recent Labs    11/20/22 1720 11/20/22 1911  HGB 13.6  --   HCT 42.2  --   PLT 118*  --   CREATININE 2.62*  --   TROPONINIHS 166* 164*    Estimated Creatinine Clearance: 12.3 mL/min (A) (by C-G formula based on SCr of 2.62 mg/dL (H)).   Medical History: Past Medical History:  Diagnosis Date   Cataract    CKD (chronic kidney disease) stage 3, GFR 30-59 ml/min (HCC)    Fatty liver disease, nonalcoholic    2007- admitted with encephalopathy unsure of cause, resolved sponatneously   Hypertension     Medications:  (Not in a hospital admission)  Scheduled:   heparin  4,000 Units Intravenous Once   Infusions:   heparin     PRN:   Assessment: 29 yof with a history of CKD, Fatty liver disease (non-alcoholic), HTN. Patient is presenting with SOB since last Thursday. Heparin per pharmacy consult placed for  Suspected PE .  Patient is not on anticoagulation prior to arrival.  Hgb 13.6; plt 118 D-Dimer > 20.00  Goal of Therapy:  Heparin level 0.3-0.7 units/ml Monitor platelets by anticoagulation protocol: Yes   Plan:  Give IV heparin 4000 units bolus x 1 Start heparin infusion at 1000 units/hr Check anti-Xa level in 8 hours and daily while on heparin Continue to monitor H&H and platelets  Delmar Landau, PharmD, BCPS 11/20/2022 9:57 PM ED Clinical Pharmacist -  [phone]

## 2022-11-20 NOTE — Progress Notes (Signed)
89 year old female with history of CKD, hypertension, and more presents the ED with a chief complaint of dyspnea.  She has had a few days of ongoing dyspnea.  It is worse with exertion.  She can even walk across the room without stopping for taking a breath.  This is very abnormal for her.  She has no associated chest pain no cough no fever.  She did have elevated troponins that are likely demand ischemia.  She also has an AKI with a creatinine of 2.62.  Reportedly, nephro recommended against a CTA due to her AKI.  She has a D-dimer greater than 20.  She will be admitted with heparin drip, requiring 2-3 L nasal cannula, for pulmonary embolism rule out with VQ scan.  Blood pressure stable.  Patient is maintaining oxygen sats on 2-3 L nasal cannula.  Continue heparin drip.  TRH to assume care when patient arrives in hospital.

## 2022-11-20 NOTE — ED Provider Notes (Signed)
[place] EMERGENCY DEPARTMENT AT [facility] Provider Note   CSN: [id number] Arrival date & time: 11/20/22  1654     History  Chief Complaint  Patient presents with   Shortness of Breath    [name] is a 89 y.o. female.  With a past medical history of CKD, hypertension and osteoarthritis who presents to the ED for shortness of breath.  Patient reports 3 days of ongoing shortness of breath made worse with minimal exertion.  She has become fatigued when walking short distances in her home.  She saw her PCP for this reason today who instructed to come to the ED for further evaluation given finding of hypoxia in the 80s on room air.  Denies chest pain, diaphoresis, nausea, vomiting, abdominal pain and diarrhea.  No fevers chills or upper respiratory symptoms.  No prior history of venous thromboembolism.  Patient is not on anticoagulation   Shortness of Breath      Home Medications Prior to Admission medications   Medication Sig Start Date End Date Taking? Authorizing Provider  ALPRAZolam Prudy Feeler) 0.5 MG tablet Take 1 tablet (0.5 mg total) by mouth 3 (three) times daily as needed. for anxiety 10/31/22   Donita Brooks, MD  amLODipine (NORVASC) 5 MG tablet TAKE ONE TABLET BY MOUTH ONCE A DAY 05/28/22   Donita Brooks, MD  cephALEXin (KEFLEX) 500 MG capsule Take 1 capsule (500 mg total) by mouth 3 (three) times daily. 11/01/22   Donita Brooks, MD  collagenase (SANTYL) 250 UNIT/GM ointment Apply 1 Application topically daily. 10/26/21   Donita Brooks, MD  denosumab (PROLIA) 60 MG/ML SOSY injection Inject 60 mg into the skin every 6 (six) months.    [provider]  diclofenac Sodium (VOLTAREN) 1 % GEL Apply topically 4 (four) times daily.    [provider]  escitalopram (LEXAPRO) 10 MG tablet Take 1 tablet (10 mg total) by mouth daily. 02/25/22   Donita Brooks, MD  losartan (COZAAR) 50 MG tablet Take 1 tablet (50 mg total) by mouth daily.  02/25/22   Donita Brooks, MD  predniSONE (DELTASONE) 10 MG tablet TAKE ONE TABLET BY MOUTH ONCE A DAY WITH BREAKFAST 06/20/22   Donita Brooks, MD  traMADol (ULTRAM) 50 MG tablet Take 1 tablet (50 mg total) by mouth every 6 (six) hours as needed. 06/20/22   Donita Brooks, MD      Allergies    Pneumococcal vaccines    Review of Systems   Review of Systems  Respiratory:  Positive for shortness of breath.     Physical Exam Updated Vital Signs BP (!) 141/63 (BP Location: Right Arm)   Pulse 79   Temp 97.8 F (36.6 C)   Resp 18   SpO2 94%  Physical Exam Vitals and nursing note reviewed.  HENT:     Head: Normocephalic and atraumatic.  Eyes:     Pupils: Pupils are equal, round, and reactive to light.  Cardiovascular:     Rate and Rhythm: Normal rate and regular rhythm.  Pulmonary:     Effort: Pulmonary effort is normal.     Breath sounds: Normal breath sounds.  Abdominal:     Palpations: Abdomen is soft.     Tenderness: There is no abdominal tenderness.  Skin:    General: Skin is warm and dry.  Neurological:     Mental Status: She is alert.  Psychiatric:        Mood and Affect: Mood  normal.     ED Results / Procedures / Treatments   Labs (all labs ordered are listed, but only abnormal results are displayed) Labs Reviewed  BASIC METABOLIC PANEL - Abnormal; Notable for the following components:      Result Value   CO2 21 (*)    Glucose, Bld 126 (*)    BUN 59 (*)    Creatinine, Ser 2.62 (*)    GFR, Estimated 17 (*)    All other components within normal limits  CBC - Abnormal; Notable for the following components:   Platelets 118 (*)    All other components within normal limits  D-DIMER, QUANTITATIVE - Abnormal; Notable for the following components:   D-Dimer, Quant >20.00 (*)    All other components within normal limits  TROPONIN I (HIGH SENSITIVITY) - Abnormal; Notable for the following components:   Troponin I (High Sensitivity) 166 (*)    All other  components within normal limits  TROPONIN I (HIGH SENSITIVITY) - Abnormal; Notable for the following components:   Troponin I (High Sensitivity) 164 (*)    All other components within normal limits  APTT  PROTIME-INR  PROTIME-INR  HEPARIN LEVEL (UNFRACTIONATED)    EKG EKG Interpretation Date/Time:  Wednesday November 20 2022 17:12:47 EDT Ventricular Rate:  83 PR Interval:  176 QRS Duration:  76 QT Interval:  376 QTC Calculation: 441 R Axis:   73  Text Interpretation: Normal sinus rhythm Low voltage QRS Nonspecific ST and T wave abnormality Abnormal ECG When compared with ECG of 26-May-2007 12:22, Questionable change in QRS axis Nonspecific T wave abnormality, worse in Inferior leads Nonspecific T wave abnormality now evident in Anterior leads LOW VOLTAGE is now present Confirmed by Dione Booze ([id number]) on 11/20/2022 11:17:00 PM  Radiology DG Chest 2 View  Result Date: 11/20/2022 CLINICAL DATA:  Shortness of breath since flu shot last week. EXAM: CHEST - 2 VIEW COMPARISON:  May 26, 2007 FINDINGS: The heart size and mediastinal contours are within normal limits. There is moderate severity calcification of the aortic arch and tortuosity of the descending thoracic aorta. Mild, bilateral infrahilar atelectatic changes are noted. There is no evidence of acute infiltrate, pleural effusion or pneumothorax. Degenerative changes are seen involving both shoulders with postoperative changes noted within the proximal right humerus. Additional multilevel degenerative changes seen throughout the thoracic spine. IMPRESSION: Mild bilateral infrahilar atelectasis. Electronically Signed   By: Aram Candela M.D.   On: 11/20/2022 20:52    Procedures Ultrasound ED Echo  Date/Time: 11/20/2022 7:15 PM  Performed by: Royanne Foots, DO Authorized by: Royanne Foots, DO   Procedure details:    Indications: dyspnea     Views: subxiphoid, parasternal long axis view, parasternal short axis view and  apical 4 chamber view     Images: archived     Limitations:  Positioning Findings:    Pericardium: no pericardial effusion     LV Function: depressed (30 - 50%)     RV Diameter: normal     IVC comment:  Unable to visualize Impression:    Impression: decreased contractility   .Critical Care  Performed by: Royanne Foots, DO Authorized by: Royanne Foots, DO   Critical care provider statement:    Critical care time (minutes):  55   Critical care time was exclusive of:  Separately billable procedures and treating other patients   Critical care was necessary to treat or prevent imminent or life-threatening deterioration of the following conditions:  Respiratory failure  Critical care was time spent personally by me on the following activities:  Development of treatment plan with patient or surrogate, discussions with consultants, evaluation of patient's response to treatment, examination of patient, ordering and review of laboratory studies, ordering and review of radiographic studies, ordering and performing treatments and interventions, pulse oximetry, re-evaluation of patient's condition and review of old charts   I assumed direction of critical care for this patient from another provider in my specialty: no     Care discussed with: admitting provider       Medications Ordered in ED Medications  heparin ADULT infusion 100 units/mL (25000 units/250mL) (1,000 Units/hr Intravenous New Bag/Given 11/20/22 2222)  sodium chloride 0.9 % bolus 1,000 mL (0 mLs Intravenous Stopped 11/20/22 2013)  heparin bolus via infusion 4,000 Units (4,000 Units Intravenous Bolus from Bag 11/20/22 2222)    ED Course/ Medical Decision Making/ A&P Clinical Course as of 11/20/22 2322  Wed Nov 20, 2022  1958 Laboratory workup notable for significantly elevated high-sensitivity troponin of 166 as well as increased BUN/creatinine from baseline.  No ischemic changes on EKG.  Discussed this patient and troponin  results with cardiologist (Dr.Berge) who agrees with plan for delta troponin.  No need for cardiology intervention at this time.  No active chest pain [MP]  2114 D-dimer is significantly elevated greater than 20.  Most likely etiology of her shortness of breath would certainly be pulmonary embolism however given her acute worsening of renal function CTA of the chest with contrast may result in further significant renal dysfunction.  I discussed this with Dr. Marisue Humble (nephrology) who shares my concern for worsening kidney injury with contrast.  We will plan for admission to [facility] and to obtain a VQ scan during her admission.  With no other clear etiology of her dyspnea on exertion or hypoxemia, will initiate heparin therapy now prior to her transfer.  She has remained stable on 2 L nasal cannula and she has also remained normotensive [MP]  2202 Discussed with admitting hospitalist who accepts patient for admission.  Patient will be transferred via EMS to [facility] [MP]    Clinical Course User Index [MP] Royanne Foots, DO                                 Medical Decision Making 89 year old female with history as above presenting to the ED for shortness of breath and hypoxia.  Shortness of breath made worse with exertion.  No chest pain fevers or chills.  No adventitious lung sounds on my exam.  Vital signs notable for hypoxia in the low 90s on room air.  Afebrile and normotensive.  Differential diagnosis includes pulmonary embolism, ACS, pneumonia, viral respiratory illness  Will obtain laboratory workup including high-sensitivity troponin, D-dimer, CBC, CMP, along with EKG and chest x-ray  Amount and/or Complexity of Data Reviewed Labs: ordered. Radiology: ordered.  Risk Prescription drug management. Decision regarding hospitalization.           Final Clinical Impression(s) / ED Diagnoses Final diagnoses:  Acute hypoxemic respiratory failure (HCC)  Chronic kidney disease, unspecified  CKD stage  Acute kidney injury [facility])    Rx / DC Orders ED Discharge Orders     None         Royanne Foots, DO 11/20/22 2322

## 2022-11-20 NOTE — ED Notes (Signed)
Received reports from [place], Charity fundraiser. Assumed care at this time.

## 2022-11-20 NOTE — Assessment & Plan Note (Signed)
Patient arrived today short of breath while ambulating. No acute distress but is dyspneic on exertion with rales to BLL. Her oxygen saturation was as low as 86% on exam but maintaining 90% while sitting and ambulating to her car. I advised her to proceed directly to the ED via EMS but she refused and requested her daughter in law drive her. I ambulated with her to her vehicle to ensure oxygen sustained at 90% and advised to proceed directly to ED.

## 2022-11-20 NOTE — ED Notes (Signed)
ED TO INPATIENT HANDOFF REPORT  ED Nurse Name and Phone #: Lynnae Prude, RN [phone]  S Name/Age/Gender [name] 89 y.o. female Room/Bed: DB011/DB011  Code Status   Code Status: Not on file  Home/SNF/Other Home Patient oriented to: self, place, time, and situation Is this baseline? Yes   Triage Complete: Triage complete  Chief Complaint Acute respiratory failure with hypoxia (HCC) [J96.01]  Triage Note Reports shortness of breath since flu shot last week. Saw PCP today- told her oxygen was in the 80's and left sided lung congestion. RT in triage for eval- lung sounds clear. 93% on RA in triage. Fatigued, SOB at rest, -n/-V/-D, denies CP.   Allergies Allergies  Allergen Reactions   Pneumococcal Vaccines Other (See Comments)    Pt had localized reaction to injection of Pneumococcal 23 - Swelling and redness in upper arm that extend to but not beyond elbow    Level of Care/Admitting Diagnosis ED Disposition     ED Disposition  Admit   Condition  --   Comment  Hospital Area: [facility] [[id number]]  Level of Care: Telemetry Medical [104]  May admit patient to [facility] or [facility] if equivalent level of care is available:: No  Interfacility transfer: Yes  Covid Evaluation: Symptomatic Person Under Investigation (PUI) or recent exposure (last 10 days) *Testing Required*  Diagnosis: Acute respiratory failure with hypoxia [facility]) [010272]  Admitting Physician: Lilyan Gilford [[id number]]  Attending Physician: Lilyan Gilford [[id number]]  Certification:: I certify this patient will need inpatient services for at least 2 midnights  Expected Medical Readiness: 11/22/2022          B Medical/Surgery History Past Medical History:  Diagnosis Date   Cataract    CKD (chronic kidney disease) stage 3, GFR 30-59 ml/min (HCC)    Fatty liver disease, nonalcoholic    2007- admitted with encephalopathy unsure of cause, resolved sponatneously    Hypertension    Past Surgical History:  Procedure Laterality Date   FRACTURE SURGERY     right shoulder, both wrists    JOINT REPLACEMENT     HIP- bilateral     A IV Location/Drains/Wounds Patient Lines/Drains/Airways Status     Active Line/Drains/Airways     Name Placement date Placement time Site Days   Peripheral IV 11/20/22 20 G Right Antecubital 11/20/22  1721  Antecubital  less than 1            Intake/Output Last 24 hours  Intake/Output Summary (Last 24 hours) at 11/20/2022 2211 Last data filed at 11/20/2022 2013 Gross per 24 hour  Intake 1000 ml  Output --  Net 1000 ml    Labs/Imaging Results for orders placed or performed during the hospital encounter of 11/20/22 (from the past 48 hour(s))  Basic metabolic panel     Status: Abnormal   Collection Time: 11/20/22  5:20 PM  Result Value Ref Range   Sodium 137 135 - 145 mmol/L   Potassium 4.9 3.5 - 5.1 mmol/L   Chloride 101 98 - 111 mmol/L   CO2 21 (L) 22 - 32 mmol/L   Glucose, Bld 126 (H) 70 - 99 mg/dL    Comment: Glucose reference range applies only to samples taken after fasting for at least 8 hours.   BUN 59 (H) 8 - 23 mg/dL   Creatinine, Ser 5.63 (H) 0.44 - 1.00 mg/dL   Calcium 9.5 8.9 - 87.5 mg/dL   GFR, Estimated 17 (L) >60 mL/min    Comment: (  NOTE) Calculated using the CKD-EPI Creatinine Equation (2021)    Anion gap 15 5 - 15    Comment: Performed at [facility], [address] [place], [place] [address]  CBC     Status: Abnormal   Collection Time: 11/20/22  5:20 PM  Result Value Ref Range   WBC 8.5 4.0 - 10.5 K/uL   RBC 4.42 3.87 - 5.11 MIL/uL   Hemoglobin 13.6 12.0 - 15.0 g/dL   HCT 60.4 54.0 - 98.1 %   MCV 95.5 80.0 - 100.0 fL   MCH 30.8 26.0 - 34.0 pg   MCHC 32.2 30.0 - 36.0 g/dL   RDW 19.1 47.8 - 29.5 %   Platelets 118 (L) 150 - 400 K/uL   nRBC 0.0 0.0 - 0.2 %    Comment: Performed at [facility], [address] [place], [place] [address]   Troponin I (High Sensitivity)     Status: Abnormal   Collection Time: 11/20/22  5:20 PM  Result Value Ref Range   Troponin I (High Sensitivity) 166 (HH) <18 ng/L    Comment: CRITICAL RESULT CALLED TO, READ BACK BY AND VERIFIED WITH:  Josefa Half, RN AT 18:15 ON 11/20/22 BY J. FALICKI (NOTE) Elevated high sensitivity troponin I (hsTnI) values and significant  changes across serial measurements may suggest ACS but many other  chronic and acute conditions are known to elevate hsTnI results.  Refer to the Links section for chest pain algorithms and additional  guidance. Performed at [facility], [address] [place], [place] [address]   D-dimer, quantitative     Status: Abnormal   Collection Time: 11/20/22  6:50 PM  Result Value Ref Range   D-Dimer, Quant >20.00 (H) 0.00 - 0.50 ug/mL-FEU    Comment: REPEATED TO VERIFY (NOTE) At the manufacturer cut-off value of 0.5 g/mL FEU, this assay has a negative predictive value of 95-100%.This assay is intended for use in conjunction with a clinical pretest probability (PTP) assessment model to exclude pulmonary embolism (PE) and deep venous thrombosis (DVT) in outpatients suspected of PE or DVT. Results should be correlated with clinical presentation. Performed at [facility], [address] [place], [place] [address]   Troponin I (High Sensitivity)     Status: Abnormal   Collection Time: 11/20/22  7:11 PM  Result Value Ref Range   Troponin I (High Sensitivity) 164 (HH) <18 ng/L    Comment: CRITICAL VALUE NOTED.  VALUE IS CONSISTENT WITH PREVIOUSLY REPORTED AND CALLED VALUE. (NOTE) Elevated high sensitivity troponin I (hsTnI) values and significant  changes across serial measurements may suggest ACS but many other  chronic and acute conditions are known to elevate hsTnI results.  Refer to the Links section for chest pain algorithms and additional  guidance. Performed at [facility], [address], [place], [place] [address]    DG Chest 2 View  Result Date: 11/20/2022 CLINICAL DATA:  Shortness of breath since flu shot last week. EXAM: CHEST - 2 VIEW COMPARISON:  May 26, 2007 FINDINGS: The heart size and mediastinal contours are within normal limits. There is moderate severity calcification of the aortic arch and tortuosity of the descending thoracic aorta. Mild, bilateral infrahilar atelectatic changes are noted. There is no evidence of acute infiltrate, pleural effusion or pneumothorax. Degenerative changes are seen involving both shoulders with postoperative changes noted within the proximal right humerus. Additional multilevel degenerative changes seen throughout the thoracic spine. IMPRESSION: Mild bilateral infrahilar atelectasis. Electronically Signed  By: Aram Candela M.D.   On: 11/20/2022 20:52    Pending Labs Unresulted Labs (From admission, onward)     Start     Ordered   11/22/22 0500  Heparin level (unfractionated)  Daily,   R      11/20/22 2156   11/21/22 0600  Heparin level (unfractionated)  Once-Timed,   URGENT        11/20/22 2156   11/21/22 0500  Protime-INR  Daily,   R      11/20/22 2154   11/20/22 2155  APTT  ONCE - STAT,   STAT        11/20/22 2154   11/20/22 2155  Protime-INR  ONCE - STAT,   STAT        11/20/22 2154            Vitals/Pain Today's Vitals   11/20/22 1945 11/20/22 2000 11/20/22 2001 11/20/22 2101  BP: (!) 154/77 128/77    Pulse: 78 78    Resp: 20 18    Temp:      TempSrc:      SpO2: 96% 97%    PainSc:   0-No pain 0-No pain    Isolation Precautions No active isolations  Medications Medications  heparin ADULT infusion 100 units/mL (25000 units/241mL) (has no administration in time range)  heparin bolus via infusion 4,000 Units (has no administration in time range)  sodium chloride 0.9 % bolus 1,000 mL (0 mLs Intravenous Stopped 11/20/22 2013)    Mobility walks     Focused  Assessments Cardiac Assessment Handoff:    Lab Results  Component Value Date   CKTOTAL 47 02/25/2019   Lab Results  Component Value Date   DDIMER >20.00 (H) 11/20/2022   Does the Patient currently have chest pain? No    R Recommendations: See Admitting Provider Note  Report given to:   Additional Notes: Patient here for SOB. On 2L [place]. No chest pain, no shortness of breath. Will be starting heparin. AAOx4,GCS 15, NAD.

## 2022-11-20 NOTE — ED Notes (Signed)
Pt SPO2 86-88%. Placed on 2LNC, SPO2 95%. Elayne Snare DO made aware

## 2022-11-20 NOTE — ED Triage Notes (Addendum)
Reports shortness of breath since flu shot last week. Saw PCP today- told her oxygen was in the 80's and left sided lung congestion. RT in triage for eval- lung sounds clear. 93% on RA in triage. Fatigued, SOB at rest, -n/-V/-D, denies CP.

## 2022-11-20 NOTE — ED Notes (Signed)
Called for transport spoke with Ruby @10 :14

## 2022-11-20 NOTE — Progress Notes (Signed)
Subjective:  HPI: [name] is a 89 y.o. female presenting on 11/20/2022 for Follow-up (SOB since Thursday, weakness )   HPI Patient is in today for shortness of breath since last Thursday. She reports she did get a flu shot on Thursday and began feeling short of breath that evening. Has been short of breath since that day with difficulty speaking and ambulating. She denies chest pain, palpitations, lightheadedness, dizziness, wheezing, swelling of lower extremities, fever, chills, body aches. No recent travel. No recent viral illness. Was advised to be seen 2 days ago and advised yesterday to proceed to ED via EMS but she declines.  Review of Systems  All other systems reviewed and are negative.   Relevant past medical history reviewed and updated as indicated.   Past Medical History:  Diagnosis Date   Cataract    CKD (chronic kidney disease) stage 3, GFR 30-59 ml/min (HCC)    Fatty liver disease, nonalcoholic    2007- admitted with encephalopathy unsure of cause, resolved sponatneously   Hypertension      Past Surgical History:  Procedure Laterality Date   FRACTURE SURGERY     right shoulder, both wrists    JOINT REPLACEMENT     HIP- bilateral    Allergies and medications reviewed and updated.   Current Outpatient Medications:    ALPRAZolam (XANAX) 0.5 MG tablet, Take 1 tablet (0.5 mg total) by mouth 3 (three) times daily as needed. for anxiety, Disp: 30 tablet, Rfl: 2   amLODipine (NORVASC) 5 MG tablet, TAKE ONE TABLET BY MOUTH ONCE A DAY, Disp: 90 tablet, Rfl: 1   cephALEXin (KEFLEX) 500 MG capsule, Take 1 capsule (500 mg total) by mouth 3 (three) times daily., Disp: 21 capsule, Rfl: 0   collagenase (SANTYL) 250 UNIT/GM ointment, Apply 1 Application topically daily., Disp: 90 g, Rfl: 0   denosumab (PROLIA) 60 MG/ML SOSY injection, Inject 60 mg into the skin every 6 (six) months., Disp: , Rfl:    diclofenac Sodium (VOLTAREN) 1 % GEL, Apply topically 4 (four)  times daily., Disp: , Rfl:    escitalopram (LEXAPRO) 10 MG tablet, Take 1 tablet (10 mg total) by mouth daily., Disp: 90 tablet, Rfl: 3   losartan (COZAAR) 50 MG tablet, Take 1 tablet (50 mg total) by mouth daily., Disp: 90 tablet, Rfl: 3   predniSONE (DELTASONE) 10 MG tablet, TAKE ONE TABLET BY MOUTH ONCE A DAY WITH BREAKFAST, Disp: 30 tablet, Rfl: 6   traMADol (ULTRAM) 50 MG tablet, Take 1 tablet (50 mg total) by mouth every 6 (six) hours as needed., Disp: 120 tablet, Rfl: 2  Allergies  Allergen Reactions   Pneumococcal Vaccines Other (See Comments)    Pt had localized reaction to injection of Pneumococcal 23 - Swelling and redness in upper arm that extend to but not beyond elbow    Objective:   BP 112/60   Pulse 84   Temp (!) 97.5 F (36.4 C) (Oral)   Ht 5\' 3"  (1.6 m)   Wt 134 lb (60.8 kg)   SpO2 (!) 86%   BMI 23.74 kg/m      11/20/2022    3:57 PM 10/31/2022   11:58 AM 06/20/2022   12:11 PM  Vitals with BMI  Height 5\' 3"  5\' 3"  5\' 3"   Weight 134 lbs 140 lbs 13 oz 133 lbs 11 oz  BMI 23.74 24.95 23.69  Systolic 112 120 782  Diastolic 60 72 62  Pulse 84 85 82  Physical Exam Vitals and nursing note reviewed.  Constitutional:      General: She is not in acute distress.    Appearance: Normal appearance. She is normal weight.  HENT:     Head: Normocephalic and atraumatic.  Cardiovascular:     Rate and Rhythm: Normal rate and regular rhythm.     Pulses: Normal pulses.     Heart sounds: Normal heart sounds.  Pulmonary:     Effort: Pulmonary effort is normal. Tachypnea present.     Breath sounds: Examination of the right-lower field reveals rhonchi. Examination of the left-lower field reveals rhonchi. Rhonchi present.  Skin:    General: Skin is warm and dry.  Neurological:     General: No focal deficit present.     Mental Status: She is alert and oriented to person, place, and time. Mental status is at baseline.  Psychiatric:        Mood and Affect: Mood normal.         Behavior: Behavior normal.        Thought Content: Thought content normal.        Judgment: Judgment normal.     Assessment & Plan:  Shortness of breath Assessment & Plan: Patient arrived today short of breath while ambulating. No acute distress but is dyspneic on exertion with rales to BLL. Her oxygen saturation was as low as 86% on exam but maintaining 90% while sitting and ambulating to her car. I advised her to proceed directly to the ED via EMS but she refused and requested her daughter in law drive her. I ambulated with her to her vehicle to ensure oxygen sustained at 90% and advised to proceed directly to ED.      Follow up plan: No follow-ups on file.  Park Meo, FNP

## 2022-11-21 ENCOUNTER — Encounter (HOSPITAL_COMMUNITY): Payer: Self-pay | Admitting: Family Medicine

## 2022-11-21 ENCOUNTER — Inpatient Hospital Stay (HOSPITAL_COMMUNITY): Payer: 59

## 2022-11-21 DIAGNOSIS — J9601 Acute respiratory failure with hypoxia: Secondary | ICD-10-CM

## 2022-11-21 DIAGNOSIS — I2489 Other forms of acute ischemic heart disease: Secondary | ICD-10-CM | POA: Diagnosis present

## 2022-11-21 DIAGNOSIS — I10 Essential (primary) hypertension: Secondary | ICD-10-CM | POA: Diagnosis not present

## 2022-11-21 DIAGNOSIS — N184 Chronic kidney disease, stage 4 (severe): Secondary | ICD-10-CM | POA: Diagnosis present

## 2022-11-21 DIAGNOSIS — N179 Acute kidney failure, unspecified: Secondary | ICD-10-CM | POA: Diagnosis present

## 2022-11-21 DIAGNOSIS — I82442 Acute embolism and thrombosis of left tibial vein: Secondary | ICD-10-CM | POA: Diagnosis present

## 2022-11-21 DIAGNOSIS — I129 Hypertensive chronic kidney disease with stage 1 through stage 4 chronic kidney disease, or unspecified chronic kidney disease: Secondary | ICD-10-CM | POA: Diagnosis present

## 2022-11-21 DIAGNOSIS — I2609 Other pulmonary embolism with acute cor pulmonale: Secondary | ICD-10-CM | POA: Diagnosis present

## 2022-11-21 DIAGNOSIS — I82432 Acute embolism and thrombosis of left popliteal vein: Secondary | ICD-10-CM | POA: Diagnosis present

## 2022-11-21 DIAGNOSIS — M170 Bilateral primary osteoarthritis of knee: Secondary | ICD-10-CM

## 2022-11-21 DIAGNOSIS — I82412 Acute embolism and thrombosis of left femoral vein: Secondary | ICD-10-CM | POA: Diagnosis present

## 2022-11-21 DIAGNOSIS — Z8249 Family history of ischemic heart disease and other diseases of the circulatory system: Secondary | ICD-10-CM | POA: Diagnosis not present

## 2022-11-21 DIAGNOSIS — K760 Fatty (change of) liver, not elsewhere classified: Secondary | ICD-10-CM | POA: Diagnosis present

## 2022-11-21 DIAGNOSIS — Z800 Family history of malignant neoplasm of digestive organs: Secondary | ICD-10-CM | POA: Diagnosis not present

## 2022-11-21 DIAGNOSIS — Z887 Allergy status to serum and vaccine status: Secondary | ICD-10-CM | POA: Diagnosis not present

## 2022-11-21 DIAGNOSIS — I2602 Saddle embolus of pulmonary artery with acute cor pulmonale: Secondary | ICD-10-CM | POA: Diagnosis not present

## 2022-11-21 DIAGNOSIS — Z7952 Long term (current) use of systemic steroids: Secondary | ICD-10-CM | POA: Diagnosis not present

## 2022-11-21 DIAGNOSIS — R0609 Other forms of dyspnea: Secondary | ICD-10-CM | POA: Diagnosis not present

## 2022-11-21 DIAGNOSIS — Z79899 Other long term (current) drug therapy: Secondary | ICD-10-CM | POA: Diagnosis not present

## 2022-11-21 DIAGNOSIS — M7989 Other specified soft tissue disorders: Secondary | ICD-10-CM | POA: Diagnosis not present

## 2022-11-21 DIAGNOSIS — I2699 Other pulmonary embolism without acute cor pulmonale: Secondary | ICD-10-CM | POA: Diagnosis not present

## 2022-11-21 DIAGNOSIS — F39 Unspecified mood [affective] disorder: Secondary | ICD-10-CM | POA: Diagnosis present

## 2022-11-21 LAB — ECHOCARDIOGRAM COMPLETE
AR max vel: 1.63 cm2
AV Area VTI: 1.62 cm2
AV Area mean vel: 1.51 cm2
AV Mean grad: 14.6 mm[Hg]
AV Peak grad: 28.8 mm[Hg]
Ao pk vel: 2.68 m/s
Area-P 1/2: 2.95 cm2
Calc EF: 80.5 %
Est EF: >75
Height: 63 in
MV VTI: 2.27 cm2
S' Lateral: 1.9 cm
Single Plane A2C EF: 84.5 %
Single Plane A4C EF: 75.4 %
Weight: 2145.34 [oz_av]

## 2022-11-21 LAB — HEPARIN LEVEL (UNFRACTIONATED)
Heparin Unfractionated: 0.26 [IU]/mL — ABNORMAL LOW (ref 0.30–0.70)
Heparin Unfractionated: <0.1 [IU]/mL — ABNORMAL LOW (ref 0.30–0.70)
Heparin Unfractionated: >1.1 [IU]/mL — ABNORMAL HIGH (ref 0.30–0.70)

## 2022-11-21 LAB — PROTIME-INR
INR: 1.1 (ref 0.8–1.2)
Prothrombin Time: 14.5 s (ref 11.4–15.2)

## 2022-11-21 MED ORDER — HEPARIN BOLUS VIA INFUSION
4000.0000 [IU] | Freq: Once | INTRAVENOUS | Status: AC
  Administered 2022-11-21: 4000 [IU] via INTRAVENOUS
  Filled 2022-11-21: qty 4000

## 2022-11-21 MED ORDER — HEPARIN (PORCINE) 25000 UT/250ML-% IV SOLN
1000.0000 [IU]/h | INTRAVENOUS | Status: DC
  Administered 2022-11-21: 1000 [IU]/h via INTRAVENOUS

## 2022-11-21 MED ORDER — PREDNISONE 10 MG PO TABS
10.0000 mg | ORAL_TABLET | Freq: Every day | ORAL | Status: DC
  Administered 2022-11-21 – 2022-11-23 (×3): 10 mg via ORAL
  Filled 2022-11-21 (×3): qty 1

## 2022-11-21 MED ORDER — ESCITALOPRAM OXALATE 10 MG PO TABS
10.0000 mg | ORAL_TABLET | Freq: Every day | ORAL | Status: DC
  Administered 2022-11-21 – 2022-11-23 (×3): 10 mg via ORAL
  Filled 2022-11-21 (×3): qty 1

## 2022-11-21 MED ORDER — HYDRALAZINE HCL 20 MG/ML IJ SOLN: 10.0000 mg | Freq: Four times a day (QID) | INTRAMUSCULAR | Status: DC | PRN

## 2022-11-21 MED ORDER — ONDANSETRON HCL 4 MG/2ML IJ SOLN: 4.0000 mg | Freq: Four times a day (QID) | INTRAMUSCULAR | Status: DC | PRN

## 2022-11-21 MED ORDER — TRAMADOL HCL 50 MG PO TABS
50.0000 mg | ORAL_TABLET | Freq: Four times a day (QID) | ORAL | Status: DC | PRN
  Administered 2022-11-22: 50 mg via ORAL
  Filled 2022-11-21: qty 1

## 2022-11-21 MED ORDER — ONDANSETRON HCL 4 MG PO TABS: 4.0000 mg | ORAL_TABLET | Freq: Four times a day (QID) | ORAL | Status: DC | PRN

## 2022-11-21 MED ORDER — ACETAMINOPHEN 650 MG RE SUPP: 650.0000 mg | Freq: Four times a day (QID) | RECTAL | Status: DC | PRN

## 2022-11-21 MED ORDER — HEPARIN (PORCINE) 25000 UT/250ML-% IV SOLN
900.0000 [IU]/h | INTRAVENOUS | Status: DC
  Administered 2022-11-22: 900 [IU]/h via INTRAVENOUS
  Filled 2022-11-21: qty 250

## 2022-11-21 MED ORDER — TECHNETIUM TO 99M ALBUMIN AGGREGATED
4.0000 | Freq: Once | INTRAVENOUS | Status: AC | PRN
  Administered 2022-11-21: 4 via INTRAVENOUS

## 2022-11-21 MED ORDER — ALPRAZOLAM 0.5 MG PO TABS
0.5000 mg | ORAL_TABLET | Freq: Three times a day (TID) | ORAL | Status: DC | PRN
  Administered 2022-11-21 – 2022-11-22 (×2): 0.5 mg via ORAL
  Filled 2022-11-21 (×2): qty 1

## 2022-11-21 MED ORDER — SODIUM CHLORIDE 0.9% FLUSH: 3.0000 mL | INTRAVENOUS | Status: DC | PRN

## 2022-11-21 MED ORDER — DICLOFENAC SODIUM 1 % EX GEL
4.0000 g | Freq: Four times a day (QID) | CUTANEOUS | Status: DC
  Filled 2022-11-21: qty 100

## 2022-11-21 MED ORDER — ACETAMINOPHEN 325 MG PO TABS: 650.0000 mg | ORAL_TABLET | Freq: Four times a day (QID) | ORAL | Status: DC | PRN

## 2022-11-21 MED ORDER — LOSARTAN POTASSIUM 50 MG PO TABS: 50.0000 mg | ORAL_TABLET | Freq: Every day | ORAL | Status: DC

## 2022-11-21 MED ORDER — SODIUM CHLORIDE 0.9% FLUSH
3.0000 mL | Freq: Two times a day (BID) | INTRAVENOUS | Status: DC
  Administered 2022-11-21 – 2022-11-23 (×4): 3 mL via INTRAVENOUS

## 2022-11-21 MED ORDER — ALBUTEROL SULFATE (2.5 MG/3ML) 0.083% IN NEBU: 2.5000 mg | INHALATION_SOLUTION | RESPIRATORY_TRACT | Status: DC | PRN

## 2022-11-21 MED ORDER — AMLODIPINE BESYLATE 5 MG PO TABS
5.0000 mg | ORAL_TABLET | Freq: Every day | ORAL | Status: DC
  Administered 2022-11-21: 5 mg via ORAL
  Filled 2022-11-21: qty 1

## 2022-11-21 NOTE — Plan of Care (Signed)
  Problem: Pain Managment: Goal: General experience of comfort will improve Outcome: Progressing   Problem: Safety: Goal: Ability to remain free from injury will improve Outcome: Progressing   Problem: Skin Integrity: Goal: Risk for impaired skin integrity will decrease Outcome: Progressing   

## 2022-11-21 NOTE — ED Notes (Signed)
[facility] here to transport patient to [facility].

## 2022-11-21 NOTE — H&P (Signed)
History and Physical    Patient: [name]:[id number] DOB: Dec 09, 1931 DOA: 11/20/2022 DOS: the patient was seen and examined on 11/21/2022 PCP: Donita Brooks, MD  Patient coming from: Home  Chief Complaint:  Chief Complaint  Patient presents with   Shortness of Breath   HPI: [name] is a 89 y.o. female with medical history significant of HTN, CKD 4, chronic prednisone-for ? probable OA presenting to the ED for evaluation of exertional dyspnea.  Per patient-for the past 1 week or so-she has noted a gradual onset dyspnea.  Dyspnea is mostly on exertion-she does not have dyspnea at rest-does not have orthopnea.  There is no swelling of the legs.  Per patient-she noted most of these symptoms after she had a flu shot approximately 10-14 days back.  Patient denies any fever, cough.  No recent sick contacts  Because of persistent exertional dyspnea-she went to her PCPs office-and was promptly referred to the ED.  While at the ED-patient to be underwent workup-D-dimer was> 20, chest x-ray did not show any significant abnormalities.  IV heparin was started-VQ scan was ordered-and patient was admitted to the hospitalist service.  My evaluation-patient has no complaints-lying comfortably in bed-she is lying flat without any major issues.  No fever No headache No nausea No vomiting No diarrhea no abdominal pain No dysuria    Review of Systems: As mentioned in the history of present illness. All other systems reviewed and are negative. Past Medical History:  Diagnosis Date   Cataract    CKD (chronic kidney disease) stage 3, GFR 30-59 ml/min (HCC)    Fatty liver disease, nonalcoholic    2007- admitted with encephalopathy unsure of cause, resolved sponatneously   Hypertension    Past Surgical History:  Procedure Laterality Date   FRACTURE SURGERY     right shoulder, both wrists    JOINT REPLACEMENT     HIP- bilateral   Social History:  reports that she  has never smoked. She has never used smokeless tobacco. She reports that she does not currently use alcohol. She reports that she does not use drugs.  Allergies  Allergen Reactions   Pneumococcal Vaccines Other (See Comments)    Pt had localized reaction to injection of Pneumococcal 23 - Swelling and redness in upper arm that extend to but not beyond elbow    Family History  Problem Relation Age of Onset   Heart disease Mother    Cancer Father        metastatic unsure of primary   Early death Brother        died at 87 y/o   Liver cancer Maternal Grandmother    Healthy Son     Prior to Admission medications   Medication Sig Start Date End Date Taking? Authorizing Provider  ALPRAZolam Prudy Feeler) 0.5 MG tablet Take 1 tablet (0.5 mg total) by mouth 3 (three) times daily as needed. for anxiety 10/31/22   Donita Brooks, MD  amLODipine (NORVASC) 5 MG tablet TAKE ONE TABLET BY MOUTH ONCE A DAY 05/28/22   Donita Brooks, MD  cephALEXin (KEFLEX) 500 MG capsule Take 1 capsule (500 mg total) by mouth 3 (three) times daily. 11/01/22   Donita Brooks, MD  collagenase (SANTYL) 250 UNIT/GM ointment Apply 1 Application topically daily. 10/26/21   Donita Brooks, MD  denosumab (PROLIA) 60 MG/ML SOSY injection Inject 60 mg into the skin every 6 (six) months.    [provider]  diclofenac Sodium (  VOLTAREN) 1 % GEL Apply topically 4 (four) times daily.    [provider]  escitalopram (LEXAPRO) 10 MG tablet Take 1 tablet (10 mg total) by mouth daily. 02/25/22   Donita Brooks, MD  losartan (COZAAR) 50 MG tablet Take 1 tablet (50 mg total) by mouth daily. 02/25/22   Donita Brooks, MD  predniSONE (DELTASONE) 10 MG tablet TAKE ONE TABLET BY MOUTH ONCE A DAY WITH BREAKFAST 06/20/22   Donita Brooks, MD  traMADol (ULTRAM) 50 MG tablet Take 1 tablet (50 mg total) by mouth every 6 (six) hours as needed. 06/20/22   Donita Brooks, MD    Physical Exam: Vitals:   11/21/22 0145  11/21/22 0307 11/21/22 0308 11/21/22 0730  BP: 136/62   134/63  Pulse: 74   75  Resp: 18   16  Temp: 97.6 F (36.4 C)   98.7 F (37.1 C)  TempSrc: Oral   Oral  SpO2: 97%   94%  Weight:  60.8 kg    Height:   5\' 3"  (1.6 m)     Gen Exam:Alert awake-not in any distress HEENT:atraumatic, normocephalic Chest: B/L clear to auscultation anteriorly CVS:S1S2 regular Abdomen:soft non tender, non distended Extremities:trace edema Neurology: Non focal Skin: no rash  Data Reviewed:     Latest Ref Rng & Units 11/20/2022    5:20 PM 10/31/2022   12:21 PM 06/20/2022   12:34 PM  CBC  WBC 4.0 - 10.5 K/uL 8.5  9.3  9.5   Hemoglobin 12.0 - 15.0 g/dL 16.1  09.6  04.5   Hematocrit 36.0 - 46.0 % 42.2  36.2  38.4   Platelets 150 - 400 K/uL 118  210  233         Latest Ref Rng & Units 11/20/2022    5:20 PM 10/31/2022   12:21 PM 06/20/2022   12:34 PM  BMP  Glucose 70 - 99 mg/dL 409  82  811   BUN 8 - 23 mg/dL 59  40  42   Creatinine 0.44 - 1.00 mg/dL 9.14  7.82  9.56   BUN/Creat Ratio 6 - 22 (calc)  23  25   Sodium 135 - 145 mmol/L 137  138  138   Potassium 3.5 - 5.1 mmol/L 4.9  5.0  5.0   Chloride 98 - 111 mmol/L 101  105  104   CO2 22 - 32 mmol/L 21  28  23    Calcium 8.9 - 10.3 mg/dL 9.5  9.0  21.3      Chest x-ray (personally reviewed) no pneumonia  Twelve-lead EKG: Poor R wave progression-sinus rhythm-nonspecific ST changes  Assessment and Plan: Exertional dyspnea Ongoing for the past 7 days Clinical picture is not consistent with pneumonia, CHF or COPD CXR without any obvious infiltrates She has severe OA-and has limited mobility-and may be somewhat sedentary-putting her at risk for VTE.  She has no travel-no recent surgery. At this point-plan is to continue IV heparin Await VQ scan Get Dopplers of lower extremity and echo Once above studies obtained-we can then formulate definitive plan of care.  Minimally weighted troponin: Trend is flat-suspect this is demand  ischemia-likely in the setting of PE Has some exertional dyspnea but concern currently is for PE If VTE is ruled out-May need further workup but will await echo Cardiology has been consulted by Epting MD  CKD 4 Some mild worsening of creatinine than usual baseline Holding ARB Volume status appears reasonable-encourage oral intake-repeat labs in a.m.  HTN BP reasonable Continue zotepine Hold ARB  Severe OA of bilateral knees-other joints Appears to be on chronic prednisone which will be resumed  Mood disorder Lexapro Debility/deconditioning PT/OT eval   Advance Care Planning:   Code Status: Full Code   Consults: None  Family Communication: None at bedside  Severity of Illness: The appropriate patient status for this patient is OBSERVATION. Observation status is judged to be reasonable and necessary in order to provide the required intensity of service to ensure the patient's safety. The patient's presenting symptoms, physical exam findings, and initial radiographic and laboratory data in the context of their medical condition is felt to place them at decreased risk for further clinical deterioration. Furthermore, it is anticipated that the patient will be medically stable for discharge from the hospital within 2 midnights of admission.   Author: Jeoffrey Massed, MD 11/21/2022 8:01 AM  For on call review [email or web address].

## 2022-11-21 NOTE — Progress Notes (Signed)
Patient arrived to 2W16 from [facility]. [facility] Admits notified via page.

## 2022-11-21 NOTE — Progress Notes (Signed)
Bilateral lower extremity venous duplex has been completed. Preliminary results can be found in CV Proc through chart review.  Results were given to the patient's nurse, Deepa.  11/21/22 4:25 PM Olen Cordial RVT

## 2022-11-21 NOTE — Consult Note (Signed)
CARDIOLOGY CONSULT NOTE       Patient ID: [name] MRN: [id number] DOB/AGE: 02-12-1931 89 y.o.  Admit date: 11/20/2022 Referring Physician: Candise Che Primary Physician: Donita Brooks, MD Primary Cardiologist: New Reason for Consultation: Dyspnea  Principal Problem:   Acute respiratory failure with hypoxia [facility]) Active Problems:   Essential hypertension   Bilateral primary osteoarthritis of knee   CKD (chronic kidney disease) stage 4, GFR 15-29 ml/min (HCC)   HPI:  89 y.o. history of HTN, CKD, on prednisone for OA transferred from [facility] for dyspnea. Progressive over a week. No PND/orthopnea or edema. Started after flu shot about 2 weeks ago No fever. No prior cardiac history No chest pain D dimer 20 CXR NAD V/Q pending this am. On heparin Troponin flat 155-164. ECG no acute changes SR rate 88 low voltage CXR with no CE only bilateral atelectasis Laying flat in bed with no dyspnea and good sats this am Hct 42.2 Cr elevated 2.6 with BUN 59 K 4.9 azotemia  ROS All other systems reviewed and negative except as noted above  Past Medical History:  Diagnosis Date   Cataract    CKD (chronic kidney disease) stage 3, GFR 30-59 ml/min (HCC)    Fatty liver disease, nonalcoholic    2007- admitted with encephalopathy unsure of cause, resolved sponatneously   Hypertension     Family History  Problem Relation Age of Onset   Heart disease Mother    Cancer Father        metastatic unsure of primary   Early death Brother        died at 62 y/o   Liver cancer Maternal Grandmother    Healthy Son     Social History   Socioeconomic History   Marital status: Widowed    Spouse name: Not on file   Number of children: 1   Years of education: Not on file   Highest education level: Not on file  Occupational History   Not on file  Tobacco Use   Smoking status: Never   Smokeless tobacco: Never  Vaping Use   Vaping status: Never Used  Substance and Sexual Activity   Alcohol use: Not  Currently   Drug use: Never   Sexual activity: Not Currently    Comment: raised tobacco, retired  Other Topics Concern   Not on file  Social History Narrative   Lives alone   Right handed    Caffeine: 1/2-1 cup coffee in AM   Social Determinants of Health   Financial Resource Strain: Low Risk  (04/09/2022)   Overall Financial Resource Strain (CARDIA)    Difficulty of Paying Living Expenses: Not hard at all  Food Insecurity: No Food Insecurity (11/21/2022)   Hunger Vital Sign    Worried About Running Out of Food in the Last Year: Never true    Ran Out of Food in the Last Year: Never true  Transportation Needs: No Transportation Needs (11/21/2022)   PRAPARE - Administrator, Civil Service (Medical): No    Lack of Transportation (Non-Medical): No  Physical Activity: Inactive (04/09/2022)   Exercise Vital Sign    Days of Exercise per Week: 0 days    Minutes of Exercise per Session: 0 min  Stress: No Stress Concern Present (04/09/2022)   [employer] of Occupational Health - Occupational Stress Questionnaire    Feeling of Stress : Not at all  Social Connections: Moderately Isolated (04/09/2022)   Social Connection and Isolation Panel [NHANES]  Frequency of Communication with Friends and Family: More than three times a week    Frequency of Social Gatherings with Friends and Family: More than three times a week    Attends Religious Services: 1 to 4 times per year    Active Member of [employer] or Organizations: No    Attends Banker Meetings: Never    Marital Status: Widowed  Intimate Partner Violence: Not At Risk (11/21/2022)   Humiliation, Afraid, Rape, and Kick questionnaire    Fear of Current or Ex-Partner: No    Emotionally Abused: No    Physically Abused: No    Sexually Abused: No    Past Surgical History:  Procedure Laterality Date   FRACTURE SURGERY     right shoulder, both wrists    JOINT REPLACEMENT     HIP- bilateral      Current  Facility-Administered Medications:    acetaminophen (TYLENOL) tablet 650 mg, 650 mg, Oral, Q6H PRN **OR** acetaminophen (TYLENOL) suppository 650 mg, 650 mg, Rectal, Q6H PRN, Ghimire, Shanker M, MD   albuterol (PROVENTIL) (2.5 MG/3ML) 0.083% nebulizer solution 2.5 mg, 2.5 mg, Nebulization, Q2H PRN, Ghimire, Werner Lean, MD   ALPRAZolam Prudy Feeler) tablet 0.5 mg, 0.5 mg, Oral, TID PRN, Ghimire, Werner Lean, MD   amLODipine (NORVASC) tablet 5 mg, 5 mg, Oral, Daily, Ghimire, Werner Lean, MD   diclofenac Sodium (VOLTAREN) 1 % topical gel 4 g, 4 g, Topical, QID, Ghimire, Werner Lean, MD   escitalopram (LEXAPRO) tablet 10 mg, 10 mg, Oral, Daily, Ghimire, Werner Lean, MD   hydrALAZINE (APRESOLINE) injection 10 mg, 10 mg, Intravenous, Q6H PRN, Ghimire, Werner Lean, MD   ondansetron (ZOFRAN) tablet 4 mg, 4 mg, Oral, Q6H PRN **OR** ondansetron (ZOFRAN) injection 4 mg, 4 mg, Intravenous, Q6H PRN, Ghimire, Werner Lean, MD   Melene Muller ON 11/22/2022] predniSONE (DELTASONE) tablet 10 mg, 10 mg, Oral, Q breakfast, Ghimire, Shanker M, MD   sodium chloride flush (NS) 0.9 % injection 3 mL, 3 mL, Intravenous, Q12H, Ghimire, Shanker M, MD, 3 mL at 11/21/22 1029   sodium chloride flush (NS) 0.9 % injection 3 mL, 3 mL, Intravenous, PRN, Ghimire, Werner Lean, MD   traMADol (ULTRAM) tablet 50 mg, 50 mg, Oral, Q6H PRN, Ghimire, Werner Lean, MD  amLODipine  5 mg Oral Daily   diclofenac Sodium  4 g Topical QID   escitalopram  10 mg Oral Daily   [START ON 11/22/2022] predniSONE  10 mg Oral Q breakfast   sodium chloride flush  3 mL Intravenous Q12H     Physical Exam: Blood pressure 134/63, pulse 75, temperature 98.7 F (37.1 C), temperature source Oral, resp. rate 16, height 5\' 3"  (1.6 m), weight 60.8 kg, SpO2 94%.   Affect appropriate Elderly female  HEENT: normal Neck supple with no adenopathy JVP normal no bruits no thyromegaly Lungs clear with no wheezing and good diaphragmatic motion Heart:  S1/S2 no murmur, no rub, gallop or  click PMI normal Abdomen: benighn, BS positve, no tenderness, no AAA no bruit.  No HSM or HJR Distal pulses intact with no bruits No edema Neuro non-focal Skin warm and dry No muscular weakness   Labs:   Lab Results  Component Value Date   WBC 8.5 11/20/2022   HGB 13.6 11/20/2022   HCT 42.2 11/20/2022   MCV 95.5 11/20/2022   PLT 118 (L) 11/20/2022    Recent Labs  Lab 11/20/22 1720  NA 137  K 4.9  CL 101  CO2 21*  BUN 59*  CREATININE 2.62*  CALCIUM 9.5  GLUCOSE 126*   Lab Results  Component Value Date   CKTOTAL 47 02/25/2019    Lab Results  Component Value Date   CHOL 195 06/05/2017   CHOL 220 (H) 07/14/2014   Lab Results  Component Value Date   HDL 44 (L) 06/05/2017   HDL 57 07/14/2014   Lab Results  Component Value Date   LDLCALC 129 (H) 06/05/2017   LDLCALC 142 (H) 07/14/2014   Lab Results  Component Value Date   TRIG 116 06/05/2017   TRIG 104 07/14/2014   Lab Results  Component Value Date   CHOLHDL 4.4 06/05/2017   CHOLHDL 3.9 07/14/2014   No results found for: "LDLDIRECT"    Radiology: DG Chest 2 View  Result Date: 11/20/2022 CLINICAL DATA:  Shortness of breath since flu shot last week. EXAM: CHEST - 2 VIEW COMPARISON:  May 26, 2007 FINDINGS: The heart size and mediastinal contours are within normal limits. There is moderate severity calcification of the aortic arch and tortuosity of the descending thoracic aorta. Mild, bilateral infrahilar atelectatic changes are noted. There is no evidence of acute infiltrate, pleural effusion or pneumothorax. Degenerative changes are seen involving both shoulders with postoperative changes noted within the proximal right humerus. Additional multilevel degenerative changes seen throughout the thoracic spine. IMPRESSION: Mild bilateral infrahilar atelectasis. Electronically Signed   By: Aram Candela M.D.   On: 11/20/2022 20:52    EKG: see HPI   ASSESSMENT AND PLAN:   Dyspnea:  biggest concern is  PE given elevated d dimer with no signs of CHF and No abnormalities on CXR. TTE to assess RV/LV EF. V/Q pending as renal function too poor for CTA continue heparin for now  CRF:  would hydrate and follow  Elevated Troponin:  no chest pain ? From PE ECG non acute if TTE shows normal EF and no RWMA would not need inpatient ischemic evaluation  HTN:  continue Norvasc    Signed: Charlton Haws 11/21/2022, 10:33 AM

## 2022-11-21 NOTE — Progress Notes (Signed)
  Echocardiogram 2D Echocardiogram has been performed.  [name] 11/21/2022, 2:52 PM

## 2022-11-21 NOTE — Progress Notes (Signed)
PHARMACY - ANTICOAGULATION CONSULT NOTE  Pharmacy Consult for Heparin  Indication: VQ scan positive for bilateral PE, venous duplex positive for LLE DVT  Allergies  Allergen Reactions   Pneumococcal Vaccines Other (See Comments)    Pt had localized reaction to injection of Pneumococcal 23 - Swelling and redness in upper arm that extend to but not beyond elbow    Patient Measurements: Height: 5\' 3"  (160 cm) Weight: 60.8 kg (134 lb 1.3 oz) IBW/kg (Calculated) : 52.4   Vital Signs: Temp: 98.1 F (36.7 C) (10/17 2121) Temp Source: Oral (10/17 2121) BP: 143/66 (10/17 2121) Pulse Rate: 84 (10/17 2121)  Labs: Recent Labs    11/20/22 1720 11/20/22 1911 11/20/22 2220 11/21/22 0607 11/21/22 1239 11/21/22 2232  HGB 13.6  --   --   --   --   --   HCT 42.2  --   --   --   --   --   PLT 118*  --   --   --   --   --   APTT  --   --  31  --   --   --   LABPROT  --   --  14.8 14.5  --   --   INR  --   --  1.1 1.1  --   --   HEPARINUNFRC  --   --   --  0.26* <0.10* >1.10*  CREATININE 2.62*  --   --   --   --   --   TROPONINIHS 166* 164*  --   --   --   --     Estimated Creatinine Clearance: 12.3 mL/min (A) (by C-G formula based on SCr of 2.62 mg/dL (H)).   Medical History: Past Medical History:  Diagnosis Date   Cataract    CKD (chronic kidney disease) stage 3, GFR 30-59 ml/min (HCC)    Fatty liver disease, nonalcoholic    2007- admitted with encephalopathy unsure of cause, resolved sponatneously   Hypertension    Assessment: Was on heparin for rule out PE  PE and DVT have been confirmed  Heparin level elevated. Drawn from arm opposite heparin infusion.   Goal of Therapy:  Heparin level 0.3-0.7 units/ml Monitor platelets by anticoagulation protocol: Yes   Plan:  Hold heparin x 1 hr Re-start heparin at 900 units/hr Re-check heparin level in 8 hours  Abran Duke, PharmD, BCPS Clinical Pharmacist Phone: [phone]

## 2022-11-21 NOTE — Plan of Care (Signed)

## 2022-11-21 NOTE — ED Notes (Signed)
Heparin infusing at transport. Patient in NAD, AAOx4.

## 2022-11-22 ENCOUNTER — Encounter (HOSPITAL_COMMUNITY): Payer: Self-pay | Admitting: Family Medicine

## 2022-11-22 ENCOUNTER — Other Ambulatory Visit (HOSPITAL_COMMUNITY): Payer: Self-pay

## 2022-11-22 DIAGNOSIS — I2609 Other pulmonary embolism with acute cor pulmonale: Secondary | ICD-10-CM | POA: Diagnosis not present

## 2022-11-22 DIAGNOSIS — J9601 Acute respiratory failure with hypoxia: Secondary | ICD-10-CM | POA: Diagnosis not present

## 2022-11-22 LAB — CBC
HCT: 39.2 % (ref 36.0–46.0)
Hemoglobin: 12.5 g/dL (ref 12.0–15.0)
MCH: 30.3 pg (ref 26.0–34.0)
MCHC: 31.9 g/dL (ref 30.0–36.0)
MCV: 95.1 fL (ref 80.0–100.0)
Platelets: 144 10*3/uL — ABNORMAL LOW (ref 150–400)
RBC: 4.12 MIL/uL (ref 3.87–5.11)
RDW: 13.9 % (ref 11.5–15.5)
WBC: 9.7 10*3/uL (ref 4.0–10.5)
nRBC: 0 % (ref 0.0–0.2)

## 2022-11-22 LAB — BASIC METABOLIC PANEL
Anion gap: 13 (ref 5–15)
BUN: 41 mg/dL — ABNORMAL HIGH (ref 8–23)
CO2: 21 mmol/L — ABNORMAL LOW (ref 22–32)
Calcium: 9.1 mg/dL (ref 8.9–10.3)
Chloride: 105 mmol/L (ref 98–111)
Creatinine, Ser: 2.09 mg/dL — ABNORMAL HIGH (ref 0.44–1.00)
GFR, Estimated: 22 mL/min — ABNORMAL LOW (ref >60)
Glucose, Bld: 102 mg/dL — ABNORMAL HIGH (ref 70–99)
Potassium: 5 mmol/L (ref 3.5–5.1)
Sodium: 139 mmol/L (ref 135–145)

## 2022-11-22 MED ORDER — METOPROLOL TARTRATE 12.5 MG HALF TABLET
12.5000 mg | ORAL_TABLET | Freq: Two times a day (BID) | ORAL | Status: DC
  Administered 2022-11-22 – 2022-11-23 (×3): 12.5 mg via ORAL
  Filled 2022-11-22 (×3): qty 1

## 2022-11-22 MED ORDER — AMLODIPINE BESYLATE 10 MG PO TABS
10.0000 mg | ORAL_TABLET | Freq: Every day | ORAL | Status: DC
  Administered 2022-11-22 – 2022-11-23 (×2): 10 mg via ORAL
  Filled 2022-11-22 (×2): qty 1

## 2022-11-22 MED ORDER — APIXABAN 5 MG PO TABS
10.0000 mg | ORAL_TABLET | Freq: Two times a day (BID) | ORAL | Status: DC
  Administered 2022-11-22 – 2022-11-23 (×3): 10 mg via ORAL
  Filled 2022-11-22 (×3): qty 2

## 2022-11-22 MED ORDER — APIXABAN 5 MG PO TABS: 5.0000 mg | ORAL_TABLET | Freq: Two times a day (BID) | ORAL | Status: DC

## 2022-11-22 NOTE — Progress Notes (Signed)
FV DistalPartial        Yes      Yes                  Acute          +---------+---------------+---------+-----------+----------+--------------+ PFV      Full                                                        +---------+---------------+---------+-----------+----------+--------------+ POP      None           No       No                   Acute          +---------+---------------+---------+-----------+----------+--------------+ PTV      Partial                                      Acute          +---------+---------------+---------+-----------+----------+--------------+ PERO     Full                                                        +---------+---------------+---------+-----------+----------+--------------+     Summary: RIGHT: - There is no evidence of deep vein thrombosis in the lower extremity.  - No cystic structure found in  the popliteal fossa.  LEFT: - Findings consistent with acute deep vein thrombosis involving the left femoral vein, left popliteal vein, and left posterior tibial veins.  - No cystic structure found in the popliteal fossa.  *See table(s) above for measurements and observations. Electronically signed by Heath Lark on 11/21/2022 at 4:33:30 PM.    Final    ECHOCARDIOGRAM COMPLETE  Result Date: 11/21/2022    ECHOCARDIOGRAM REPORT   Patient Name:   [name] Date of Exam: 11/21/2022 Medical Rec #:  [id number]         Height:       63.0 in Accession #:    [id number]        Weight:       134.1 lb Date of Birth:  Feb 27, 1931         BSA:          1.631 m Patient Age:    89 years          BP:           129/74 mmHg Patient Gender: F                 HR:           76 bpm. Exam Location:  Inpatient Procedure: 2D Echo, Cardiac Doppler and Color Doppler Indications:    I26.02 Pulmonary embolus  History:        Patient has prior history of Echocardiogram examinations, most                 recent 03/06/2005. Signs/Symptoms:Shortness of Breath; Risk                 Factors:Hypertension. Hypoxia.  Sonographer:  Subjective:  Dyspnea about same  Objective:  Vitals:   11/22/22 0130 11/22/22 0600 11/22/22 0815 11/22/22 0843  BP: (!) 148/72 (!) 135/97 (!) 158/80 (!) 156/68  Pulse: 73 69 83 79  Resp: 17 15 18 18   Temp: (!) 97.5 F (36.4 C) 98.3 F (36.8 C) 98.4 F (36.9 C) 97.9 F (36.6 C)  TempSrc: Axillary Oral Oral Oral  SpO2: 99% 100% 94% 100%  Weight:      Height:        Intake/Output from previous day:  Intake/Output Summary (Last 24 hours) at 11/22/2022 0945 Last data filed at 11/22/2022 0901 Gross per 24 hour  Intake 755.69 ml  Output --  Net 755.69 ml    Physical Exam:  Affect appropriate Chronically ill elderly female  HEENT: normal Neck supple with no adenopathy JVP normal no bruits no thyromegaly Lungs clear with no wheezing and good diaphragmatic motion Heart:  S1/S2 SEM  murmur, no rub, gallop or click PMI normal Abdomen: benighn, BS positve, no tenderness, no AAA no bruit.  No HSM or HJR Distal pulses intact with no bruits No edema Neuro non-focal Skin warm and dry No muscular weakness   Lab Results: Basic Metabolic Panel: Recent Labs    11/20/22 1720  NA 137  K 4.9  CL 101  CO2 21*  GLUCOSE 126*  BUN 59*  CREATININE 2.62*  CALCIUM 9.5    CBC: Recent Labs    11/20/22 1720  WBC 8.5  HGB 13.6  HCT 42.2  MCV 95.5  PLT 118*    BNP: Invalid input(s): "POCBNP" D-Dimer: Recent Labs    11/20/22 1850  DDIMER >20.00*    Imaging: VAS [place] LOWER EXTREMITY VENOUS (DVT)  Result Date: 11/21/2022  Lower Venous DVT Study Patient Name:  [name]  Date of Exam:   11/21/2022 Medical Rec #: [id number]          Accession #:    [id number] Date of Birth: 12/20/1931          Patient Gender: F Patient Age:   70 years Exam Location:  [facility] Procedure:      VAS [place] LOWER EXTREMITY VENOUS (DVT) Referring Phys: Jeoffrey Massed --------------------------------------------------------------------------------  Indications: Swelling.   Risk Factors: None identified. Limitations: Poor ultrasound/tissue interface. Comparison Study: No prior studies. Performing Technologist: Chanda Busing RVT  Examination Guidelines: A complete evaluation includes B-mode imaging, spectral Doppler, color Doppler, and power Doppler as needed of all accessible portions of each vessel. Bilateral testing is considered an integral part of a complete examination. Limited examinations for reoccurring indications may be performed as noted. The reflux portion of the exam is performed with the patient in reverse Trendelenburg.  +---------+---------------+---------+-----------+----------+--------------+ RIGHT    CompressibilityPhasicitySpontaneityPropertiesThrombus Aging +---------+---------------+---------+-----------+----------+--------------+ CFV      Full           Yes      Yes                                 +---------+---------------+---------+-----------+----------+--------------+ SFJ      Full                                                        +---------+---------------+---------+-----------+----------+--------------+ FV Prox  Full                                                        +---------+---------------+---------+-----------+----------+--------------+  Subjective:  Dyspnea about same  Objective:  Vitals:   11/22/22 0130 11/22/22 0600 11/22/22 0815 11/22/22 0843  BP: (!) 148/72 (!) 135/97 (!) 158/80 (!) 156/68  Pulse: 73 69 83 79  Resp: 17 15 18 18   Temp: (!) 97.5 F (36.4 C) 98.3 F (36.8 C) 98.4 F (36.9 C) 97.9 F (36.6 C)  TempSrc: Axillary Oral Oral Oral  SpO2: 99% 100% 94% 100%  Weight:      Height:        Intake/Output from previous day:  Intake/Output Summary (Last 24 hours) at 11/22/2022 0945 Last data filed at 11/22/2022 0901 Gross per 24 hour  Intake 755.69 ml  Output --  Net 755.69 ml    Physical Exam:  Affect appropriate Chronically ill elderly female  HEENT: normal Neck supple with no adenopathy JVP normal no bruits no thyromegaly Lungs clear with no wheezing and good diaphragmatic motion Heart:  S1/S2 SEM  murmur, no rub, gallop or click PMI normal Abdomen: benighn, BS positve, no tenderness, no AAA no bruit.  No HSM or HJR Distal pulses intact with no bruits No edema Neuro non-focal Skin warm and dry No muscular weakness   Lab Results: Basic Metabolic Panel: Recent Labs    11/20/22 1720  NA 137  K 4.9  CL 101  CO2 21*  GLUCOSE 126*  BUN 59*  CREATININE 2.62*  CALCIUM 9.5    CBC: Recent Labs    11/20/22 1720  WBC 8.5  HGB 13.6  HCT 42.2  MCV 95.5  PLT 118*    BNP: Invalid input(s): "POCBNP" D-Dimer: Recent Labs    11/20/22 1850  DDIMER >20.00*    Imaging: VAS [place] LOWER EXTREMITY VENOUS (DVT)  Result Date: 11/21/2022  Lower Venous DVT Study Patient Name:  [name]  Date of Exam:   11/21/2022 Medical Rec #: [id number]          Accession #:    [id number] Date of Birth: 12/20/1931          Patient Gender: F Patient Age:   70 years Exam Location:  [facility] Procedure:      VAS [place] LOWER EXTREMITY VENOUS (DVT) Referring Phys: Jeoffrey Massed --------------------------------------------------------------------------------  Indications: Swelling.   Risk Factors: None identified. Limitations: Poor ultrasound/tissue interface. Comparison Study: No prior studies. Performing Technologist: Chanda Busing RVT  Examination Guidelines: A complete evaluation includes B-mode imaging, spectral Doppler, color Doppler, and power Doppler as needed of all accessible portions of each vessel. Bilateral testing is considered an integral part of a complete examination. Limited examinations for reoccurring indications may be performed as noted. The reflux portion of the exam is performed with the patient in reverse Trendelenburg.  +---------+---------------+---------+-----------+----------+--------------+ RIGHT    CompressibilityPhasicitySpontaneityPropertiesThrombus Aging +---------+---------------+---------+-----------+----------+--------------+ CFV      Full           Yes      Yes                                 +---------+---------------+---------+-----------+----------+--------------+ SFJ      Full                                                        +---------+---------------+---------+-----------+----------+--------------+ FV Prox  Full                                                        +---------+---------------+---------+-----------+----------+--------------+  FV DistalPartial        Yes      Yes                  Acute          +---------+---------------+---------+-----------+----------+--------------+ PFV      Full                                                        +---------+---------------+---------+-----------+----------+--------------+ POP      None           No       No                   Acute          +---------+---------------+---------+-----------+----------+--------------+ PTV      Partial                                      Acute          +---------+---------------+---------+-----------+----------+--------------+ PERO     Full                                                        +---------+---------------+---------+-----------+----------+--------------+     Summary: RIGHT: - There is no evidence of deep vein thrombosis in the lower extremity.  - No cystic structure found in  the popliteal fossa.  LEFT: - Findings consistent with acute deep vein thrombosis involving the left femoral vein, left popliteal vein, and left posterior tibial veins.  - No cystic structure found in the popliteal fossa.  *See table(s) above for measurements and observations. Electronically signed by Heath Lark on 11/21/2022 at 4:33:30 PM.    Final    ECHOCARDIOGRAM COMPLETE  Result Date: 11/21/2022    ECHOCARDIOGRAM REPORT   Patient Name:   [name] Date of Exam: 11/21/2022 Medical Rec #:  [id number]         Height:       63.0 in Accession #:    [id number]        Weight:       134.1 lb Date of Birth:  Feb 27, 1931         BSA:          1.631 m Patient Age:    89 years          BP:           129/74 mmHg Patient Gender: F                 HR:           76 bpm. Exam Location:  Inpatient Procedure: 2D Echo, Cardiac Doppler and Color Doppler Indications:    I26.02 Pulmonary embolus  History:        Patient has prior history of Echocardiogram examinations, most                 recent 03/06/2005. Signs/Symptoms:Shortness of Breath; Risk                 Factors:Hypertension. Hypoxia.  Sonographer:  Subjective:  Dyspnea about same  Objective:  Vitals:   11/22/22 0130 11/22/22 0600 11/22/22 0815 11/22/22 0843  BP: (!) 148/72 (!) 135/97 (!) 158/80 (!) 156/68  Pulse: 73 69 83 79  Resp: 17 15 18 18   Temp: (!) 97.5 F (36.4 C) 98.3 F (36.8 C) 98.4 F (36.9 C) 97.9 F (36.6 C)  TempSrc: Axillary Oral Oral Oral  SpO2: 99% 100% 94% 100%  Weight:      Height:        Intake/Output from previous day:  Intake/Output Summary (Last 24 hours) at 11/22/2022 0945 Last data filed at 11/22/2022 0901 Gross per 24 hour  Intake 755.69 ml  Output --  Net 755.69 ml    Physical Exam:  Affect appropriate Chronically ill elderly female  HEENT: normal Neck supple with no adenopathy JVP normal no bruits no thyromegaly Lungs clear with no wheezing and good diaphragmatic motion Heart:  S1/S2 SEM  murmur, no rub, gallop or click PMI normal Abdomen: benighn, BS positve, no tenderness, no AAA no bruit.  No HSM or HJR Distal pulses intact with no bruits No edema Neuro non-focal Skin warm and dry No muscular weakness   Lab Results: Basic Metabolic Panel: Recent Labs    11/20/22 1720  NA 137  K 4.9  CL 101  CO2 21*  GLUCOSE 126*  BUN 59*  CREATININE 2.62*  CALCIUM 9.5    CBC: Recent Labs    11/20/22 1720  WBC 8.5  HGB 13.6  HCT 42.2  MCV 95.5  PLT 118*    BNP: Invalid input(s): "POCBNP" D-Dimer: Recent Labs    11/20/22 1850  DDIMER >20.00*    Imaging: VAS [place] LOWER EXTREMITY VENOUS (DVT)  Result Date: 11/21/2022  Lower Venous DVT Study Patient Name:  [name]  Date of Exam:   11/21/2022 Medical Rec #: [id number]          Accession #:    [id number] Date of Birth: 12/20/1931          Patient Gender: F Patient Age:   70 years Exam Location:  [facility] Procedure:      VAS [place] LOWER EXTREMITY VENOUS (DVT) Referring Phys: Jeoffrey Massed --------------------------------------------------------------------------------  Indications: Swelling.   Risk Factors: None identified. Limitations: Poor ultrasound/tissue interface. Comparison Study: No prior studies. Performing Technologist: Chanda Busing RVT  Examination Guidelines: A complete evaluation includes B-mode imaging, spectral Doppler, color Doppler, and power Doppler as needed of all accessible portions of each vessel. Bilateral testing is considered an integral part of a complete examination. Limited examinations for reoccurring indications may be performed as noted. The reflux portion of the exam is performed with the patient in reverse Trendelenburg.  +---------+---------------+---------+-----------+----------+--------------+ RIGHT    CompressibilityPhasicitySpontaneityPropertiesThrombus Aging +---------+---------------+---------+-----------+----------+--------------+ CFV      Full           Yes      Yes                                 +---------+---------------+---------+-----------+----------+--------------+ SFJ      Full                                                        +---------+---------------+---------+-----------+----------+--------------+ FV Prox  Full                                                        +---------+---------------+---------+-----------+----------+--------------+  FV DistalPartial        Yes      Yes                  Acute          +---------+---------------+---------+-----------+----------+--------------+ PFV      Full                                                        +---------+---------------+---------+-----------+----------+--------------+ POP      None           No       No                   Acute          +---------+---------------+---------+-----------+----------+--------------+ PTV      Partial                                      Acute          +---------+---------------+---------+-----------+----------+--------------+ PERO     Full                                                        +---------+---------------+---------+-----------+----------+--------------+     Summary: RIGHT: - There is no evidence of deep vein thrombosis in the lower extremity.  - No cystic structure found in  the popliteal fossa.  LEFT: - Findings consistent with acute deep vein thrombosis involving the left femoral vein, left popliteal vein, and left posterior tibial veins.  - No cystic structure found in the popliteal fossa.  *See table(s) above for measurements and observations. Electronically signed by Heath Lark on 11/21/2022 at 4:33:30 PM.    Final    ECHOCARDIOGRAM COMPLETE  Result Date: 11/21/2022    ECHOCARDIOGRAM REPORT   Patient Name:   [name] Date of Exam: 11/21/2022 Medical Rec #:  [id number]         Height:       63.0 in Accession #:    [id number]        Weight:       134.1 lb Date of Birth:  Feb 27, 1931         BSA:          1.631 m Patient Age:    89 years          BP:           129/74 mmHg Patient Gender: F                 HR:           76 bpm. Exam Location:  Inpatient Procedure: 2D Echo, Cardiac Doppler and Color Doppler Indications:    I26.02 Pulmonary embolus  History:        Patient has prior history of Echocardiogram examinations, most                 recent 03/06/2005. Signs/Symptoms:Shortness of Breath; Risk                 Factors:Hypertension. Hypoxia.  Sonographer:

## 2022-11-22 NOTE — Progress Notes (Signed)
Transition of Care [facility]) - Inpatient Brief Assessment   Patient Details  Name: TERRILL TROMPETER MRN: [id number] Date of Birth: [birth year]  Transition of Care [facility]) CM/SW Contact:    Janae Bridgeman, RN Phone Number: 11/22/2022, 12:01 PM   Clinical Narrative: Patient admitted for Acute Respiratory failure with hypoxia.  The patient lives at home alone and is currently on RA.  The patient lives alone but have family available to assist.  Patient states that she is still driving in the community.  DME at the home includes Rolator, RW and shower chair.  PT/OT evaluation pending at this time.  The patient's daughter in law called to discuss patient's likely need for personal care services in the home.   I asked that the daughter call the PCP office and schedule a hospital follow up in the next week and the PCP can complete personal care services forms and have [employer] RN evaluate for needs at the home outside of home health services.  Patient's daughter in law will provide transportation to home when patient is medically stable for discharge.   Transition of Care Asessment: Insurance and Status: (P) Insurance coverage has been reviewed Patient has primary care physician: (P) Yes Home environment has been reviewed: (P) Home alone Prior level of function:: (P) Independent - continues to drive in the community Prior/Current Home Services: (P) No current home services Social Determinants of Health Reivew: (P) SDOH reviewed needs interventions Readmission risk has been reviewed: (P) Yes Transition of care needs: (P) transition of care needs identified, TOC will continue to follow

## 2022-11-22 NOTE — Progress Notes (Signed)
FV DistalPartial        Yes      Yes                  Acute          +---------+---------------+---------+-----------+----------+--------------+ PFV      Full                                                        +---------+---------------+---------+-----------+----------+--------------+ POP      None           No       No                   Acute          +---------+---------------+---------+-----------+----------+--------------+ PTV      Partial                                      Acute          +---------+---------------+---------+-----------+----------+--------------+ PERO     Full                                                        +---------+---------------+---------+-----------+----------+--------------+     Summary: RIGHT: - There is no evidence of deep vein thrombosis in the lower extremity.  - No cystic structure found in the popliteal fossa.  LEFT: - Findings consistent with acute deep vein thrombosis involving the left femoral vein, left popliteal vein, and left  posterior tibial veins.  - No cystic structure found in the popliteal fossa.  *See table(s) above for measurements and observations. Electronically signed by Heath Lark on 11/21/2022 at 4:33:30 PM.    Final    ECHOCARDIOGRAM COMPLETE  Result Date: 11/21/2022    ECHOCARDIOGRAM REPORT   Patient Name:   [name] Date of Exam: 11/21/2022 Medical Rec #:  [id number]         Height:       63.0 in Accession #:    [id number]        Weight:       134.1 lb Date of Birth:  July 12, 1931         BSA:          1.631 m Patient Age:    89 years          BP:           129/74 mmHg Patient Gender: F                 HR:           76 bpm. Exam Location:  Inpatient Procedure: 2D Echo, Cardiac Doppler and Color Doppler Indications:    I26.02 Pulmonary embolus  History:        Patient has prior history of Echocardiogram examinations, most                 recent 03/06/2005. Signs/Symptoms:Shortness of Breath; Risk                 Factors:Hypertension. Hypoxia.  Sonographer:  PROGRESS NOTE    [name]  ZOX:[id number] DOB: 1931-09-02 DOA: 11/20/2022 PCP: Donita Brooks, MD    Brief Narrative:  89 year old with severe osteoarthritis on chronic prednisone therapy, essential hypertension, CKD stage IV presented with about 1 week of gradual onset dyspnea.  In the emergency room creatinine 2.6.  D-dimer more than 20.  No other explanation of shortness of breath.  VQ scan and duplex is positive for blood clot.  Subjective: Patient seen and examined.  Her son was at the bedside.  Patient denied any overnight events.  She has not ambulated.  Patient tells me that she has limitation on walking and has a lot of pain on her left knee.  Denies any chest pain or shortness of breath. Discussed with patient and her son about oral anticoagulation, findings on VQ scan and also with DVT.  They agreed to go on Eliquis. Patient is also worried about skin tear that happened with a blood pressure cuff yesterday in the ER.   Assessment & Plan:   Bilateral pulmonary embolism Left lower extremity acute DVT: Patient is on chronic prednisone therapy, also mostly immobile.   Echocardiogram with normal ejection fraction, she does have some evidence of right ventricular strain however with patient's stability, she will be best served with anticoagulation therapy.  Will not suggest any thrombolytics. Stop heparin.  Start Eliquis.  Mobilize with PT OT to evaluate safe disposition.  Troponin elevation: Due to above.  Acute coronary syndrome ruled out.  CKD stage IV: Repeat renal function test.  Likely her new baseline.  Essential hypertension: Blood pressure elevated.  Increase amlodipine from 5 mg to 10 mg.  Will monitor.  Osteoarthritis, severe and debilitating: On chronic prednisone therapy.  Continue.  Mobilize with PT OT.  Mood disorder: On Lexapro.  Skin tear right upper arm: Dry dressing.     DVT prophylaxis:  apixaban (ELIQUIS) tablet 10 mg  apixaban (ELIQUIS)  tablet 5 mg   Code Status: Full code Family Communication: Son at the bedside Disposition Plan: Status is: Inpatient Remains inpatient appropriate because: Acute PE and DVT, monitoring on therapeutics     Consultants:  None  Procedures:  None  Antimicrobials:  None     Objective: Vitals:   11/22/22 0130 11/22/22 0600 11/22/22 0815 11/22/22 0843  BP: (!) 148/72 (!) 135/97 (!) 158/80 (!) 156/68  Pulse: 73 69 83 79  Resp: 17 15 18 18   Temp: (!) 97.5 F (36.4 C) 98.3 F (36.8 C) 98.4 F (36.9 C) 97.9 F (36.6 C)  TempSrc: Axillary Oral Oral Oral  SpO2: 99% 100% 94% 100%  Weight:      Height:        Intake/Output Summary (Last 24 hours) at 11/22/2022 1052 Last data filed at 11/22/2022 0901 Gross per 24 hour  Intake 755.69 ml  Output --  Net 755.69 ml   Filed Weights   11/21/22 0307  Weight: 60.8 kg    Examination:  General exam: Appears calm and comfortable  Comfortable to interaction.  Pleasant. Respiratory system: Clear to auscultation. Respiratory effort normal.  No added sounds.  Mostly on room air. SpO2: 100 % O2 Flow Rate (L/min): 2 L/min FiO2 (%): (!) 2 %  Cardiovascular system: S1 & S2 heard, RRR.  Gastrointestinal system: Abdomen is nondistended, soft and nontender. No organomegaly or masses felt. Normal bowel sounds heard. Central nervous system: Alert and oriented. No focal neurological deficits. Extremities: Symmetric 5 x 5 power. Skin: Skin tear right upper arm. No  FV DistalPartial        Yes      Yes                  Acute          +---------+---------------+---------+-----------+----------+--------------+ PFV      Full                                                        +---------+---------------+---------+-----------+----------+--------------+ POP      None           No       No                   Acute          +---------+---------------+---------+-----------+----------+--------------+ PTV      Partial                                      Acute          +---------+---------------+---------+-----------+----------+--------------+ PERO     Full                                                        +---------+---------------+---------+-----------+----------+--------------+     Summary: RIGHT: - There is no evidence of deep vein thrombosis in the lower extremity.  - No cystic structure found in the popliteal fossa.  LEFT: - Findings consistent with acute deep vein thrombosis involving the left femoral vein, left popliteal vein, and left  posterior tibial veins.  - No cystic structure found in the popliteal fossa.  *See table(s) above for measurements and observations. Electronically signed by Heath Lark on 11/21/2022 at 4:33:30 PM.    Final    ECHOCARDIOGRAM COMPLETE  Result Date: 11/21/2022    ECHOCARDIOGRAM REPORT   Patient Name:   [name] Date of Exam: 11/21/2022 Medical Rec #:  [id number]         Height:       63.0 in Accession #:    [id number]        Weight:       134.1 lb Date of Birth:  July 12, 1931         BSA:          1.631 m Patient Age:    89 years          BP:           129/74 mmHg Patient Gender: F                 HR:           76 bpm. Exam Location:  Inpatient Procedure: 2D Echo, Cardiac Doppler and Color Doppler Indications:    I26.02 Pulmonary embolus  History:        Patient has prior history of Echocardiogram examinations, most                 recent 03/06/2005. Signs/Symptoms:Shortness of Breath; Risk                 Factors:Hypertension. Hypoxia.  Sonographer:  edema.  Negative Homans' sign.    Data Reviewed: I have personally reviewed following labs and imaging studies  CBC: Recent Labs  Lab 11/20/22 1720  WBC 8.5  HGB 13.6  HCT 42.2  MCV 95.5  PLT 118*   Basic Metabolic Panel: Recent Labs  Lab 11/20/22 1720  NA 137  K 4.9  CL 101  CO2 21*  GLUCOSE 126*  BUN 59*  CREATININE 2.62*  CALCIUM 9.5   GFR: Estimated Creatinine Clearance: 12.3 mL/min (A) (by C-G formula based on SCr of 2.62 mg/dL (H)). Liver Function  Tests: No results for input(s): "AST", "ALT", "ALKPHOS", "BILITOT", "PROT", "ALBUMIN" in the last 168 hours. No results for input(s): "LIPASE", "AMYLASE" in the last 168 hours. No results for input(s): "AMMONIA" in the last 168 hours. Coagulation Profile: Recent Labs  Lab 11/20/22 2220 11/21/22 0607  INR 1.1 1.1   Cardiac Enzymes: No results for input(s): "CKTOTAL", "CKMB", "CKMBINDEX", "TROPONINI" in the last 168 hours. BNP (last 3 results) No results for input(s): "PROBNP" in the last 8760 hours. HbA1C: No results for input(s): "HGBA1C" in the last 72 hours. CBG: No results for input(s): "GLUCAP" in the last 168 hours. Lipid Profile: No results for input(s): "CHOL", "HDL", "LDLCALC", "TRIG", "CHOLHDL", "LDLDIRECT" in the last 72 hours. Thyroid Function Tests: No results for input(s): "TSH", "T4TOTAL", "FREET4", "T3FREE", "THYROIDAB" in the last 72 hours. Anemia Panel: No results for input(s): "VITAMINB12", "FOLATE", "FERRITIN", "TIBC", "IRON", "RETICCTPCT" in the last 72 hours. Sepsis Labs: No results for input(s): "PROCALCITON", "LATICACIDVEN" in the last 168 hours.  No results found for this or any previous visit (from the past 240 hour(s)).       Radiology Studies: VAS [place] LOWER EXTREMITY VENOUS (DVT)  Result Date: 11/21/2022  Lower Venous DVT Study Patient Name:  [name]  Date of Exam:   11/21/2022 Medical Rec #: [id number]          Accession #:    [id number] Date of Birth: 09-23-1931          Patient Gender: F Patient Age:   17 years Exam Location:  [facility] Procedure:      VAS [place] LOWER EXTREMITY VENOUS (DVT) Referring Phys: Jeoffrey Massed --------------------------------------------------------------------------------  Indications: Swelling.  Risk Factors: None identified. Limitations: Poor ultrasound/tissue interface. Comparison Study: No prior studies. Performing Technologist: Chanda Busing RVT  Examination Guidelines: A complete evaluation  includes B-mode imaging, spectral Doppler, color Doppler, and power Doppler as needed of all accessible portions of each vessel. Bilateral testing is considered an integral part of a complete examination. Limited examinations for reoccurring indications may be performed as noted. The reflux portion of the exam is performed with the patient in reverse Trendelenburg.  +---------+---------------+---------+-----------+----------+--------------+ RIGHT    CompressibilityPhasicitySpontaneityPropertiesThrombus Aging +---------+---------------+---------+-----------+----------+--------------+ CFV      Full           Yes      Yes                                 +---------+---------------+---------+-----------+----------+--------------+ SFJ      Full                                                        +---------+---------------+---------+-----------+----------+--------------+ FV Prox  Full                                                        +---------+---------------+---------+-----------+----------+--------------+  PROGRESS NOTE    [name]  ZOX:[id number] DOB: 1931-09-02 DOA: 11/20/2022 PCP: Donita Brooks, MD    Brief Narrative:  89 year old with severe osteoarthritis on chronic prednisone therapy, essential hypertension, CKD stage IV presented with about 1 week of gradual onset dyspnea.  In the emergency room creatinine 2.6.  D-dimer more than 20.  No other explanation of shortness of breath.  VQ scan and duplex is positive for blood clot.  Subjective: Patient seen and examined.  Her son was at the bedside.  Patient denied any overnight events.  She has not ambulated.  Patient tells me that she has limitation on walking and has a lot of pain on her left knee.  Denies any chest pain or shortness of breath. Discussed with patient and her son about oral anticoagulation, findings on VQ scan and also with DVT.  They agreed to go on Eliquis. Patient is also worried about skin tear that happened with a blood pressure cuff yesterday in the ER.   Assessment & Plan:   Bilateral pulmonary embolism Left lower extremity acute DVT: Patient is on chronic prednisone therapy, also mostly immobile.   Echocardiogram with normal ejection fraction, she does have some evidence of right ventricular strain however with patient's stability, she will be best served with anticoagulation therapy.  Will not suggest any thrombolytics. Stop heparin.  Start Eliquis.  Mobilize with PT OT to evaluate safe disposition.  Troponin elevation: Due to above.  Acute coronary syndrome ruled out.  CKD stage IV: Repeat renal function test.  Likely her new baseline.  Essential hypertension: Blood pressure elevated.  Increase amlodipine from 5 mg to 10 mg.  Will monitor.  Osteoarthritis, severe and debilitating: On chronic prednisone therapy.  Continue.  Mobilize with PT OT.  Mood disorder: On Lexapro.  Skin tear right upper arm: Dry dressing.     DVT prophylaxis:  apixaban (ELIQUIS) tablet 10 mg  apixaban (ELIQUIS)  tablet 5 mg   Code Status: Full code Family Communication: Son at the bedside Disposition Plan: Status is: Inpatient Remains inpatient appropriate because: Acute PE and DVT, monitoring on therapeutics     Consultants:  None  Procedures:  None  Antimicrobials:  None     Objective: Vitals:   11/22/22 0130 11/22/22 0600 11/22/22 0815 11/22/22 0843  BP: (!) 148/72 (!) 135/97 (!) 158/80 (!) 156/68  Pulse: 73 69 83 79  Resp: 17 15 18 18   Temp: (!) 97.5 F (36.4 C) 98.3 F (36.8 C) 98.4 F (36.9 C) 97.9 F (36.6 C)  TempSrc: Axillary Oral Oral Oral  SpO2: 99% 100% 94% 100%  Weight:      Height:        Intake/Output Summary (Last 24 hours) at 11/22/2022 1052 Last data filed at 11/22/2022 0901 Gross per 24 hour  Intake 755.69 ml  Output --  Net 755.69 ml   Filed Weights   11/21/22 0307  Weight: 60.8 kg    Examination:  General exam: Appears calm and comfortable  Comfortable to interaction.  Pleasant. Respiratory system: Clear to auscultation. Respiratory effort normal.  No added sounds.  Mostly on room air. SpO2: 100 % O2 Flow Rate (L/min): 2 L/min FiO2 (%): (!) 2 %  Cardiovascular system: S1 & S2 heard, RRR.  Gastrointestinal system: Abdomen is nondistended, soft and nontender. No organomegaly or masses felt. Normal bowel sounds heard. Central nervous system: Alert and oriented. No focal neurological deficits. Extremities: Symmetric 5 x 5 power. Skin: Skin tear right upper arm. No  edema.  Negative Homans' sign.    Data Reviewed: I have personally reviewed following labs and imaging studies  CBC: Recent Labs  Lab 11/20/22 1720  WBC 8.5  HGB 13.6  HCT 42.2  MCV 95.5  PLT 118*   Basic Metabolic Panel: Recent Labs  Lab 11/20/22 1720  NA 137  K 4.9  CL 101  CO2 21*  GLUCOSE 126*  BUN 59*  CREATININE 2.62*  CALCIUM 9.5   GFR: Estimated Creatinine Clearance: 12.3 mL/min (A) (by C-G formula based on SCr of 2.62 mg/dL (H)). Liver Function  Tests: No results for input(s): "AST", "ALT", "ALKPHOS", "BILITOT", "PROT", "ALBUMIN" in the last 168 hours. No results for input(s): "LIPASE", "AMYLASE" in the last 168 hours. No results for input(s): "AMMONIA" in the last 168 hours. Coagulation Profile: Recent Labs  Lab 11/20/22 2220 11/21/22 0607  INR 1.1 1.1   Cardiac Enzymes: No results for input(s): "CKTOTAL", "CKMB", "CKMBINDEX", "TROPONINI" in the last 168 hours. BNP (last 3 results) No results for input(s): "PROBNP" in the last 8760 hours. HbA1C: No results for input(s): "HGBA1C" in the last 72 hours. CBG: No results for input(s): "GLUCAP" in the last 168 hours. Lipid Profile: No results for input(s): "CHOL", "HDL", "LDLCALC", "TRIG", "CHOLHDL", "LDLDIRECT" in the last 72 hours. Thyroid Function Tests: No results for input(s): "TSH", "T4TOTAL", "FREET4", "T3FREE", "THYROIDAB" in the last 72 hours. Anemia Panel: No results for input(s): "VITAMINB12", "FOLATE", "FERRITIN", "TIBC", "IRON", "RETICCTPCT" in the last 72 hours. Sepsis Labs: No results for input(s): "PROCALCITON", "LATICACIDVEN" in the last 168 hours.  No results found for this or any previous visit (from the past 240 hour(s)).       Radiology Studies: VAS [place] LOWER EXTREMITY VENOUS (DVT)  Result Date: 11/21/2022  Lower Venous DVT Study Patient Name:  [name]  Date of Exam:   11/21/2022 Medical Rec #: [id number]          Accession #:    [id number] Date of Birth: 09-23-1931          Patient Gender: F Patient Age:   17 years Exam Location:  [facility] Procedure:      VAS [place] LOWER EXTREMITY VENOUS (DVT) Referring Phys: Jeoffrey Massed --------------------------------------------------------------------------------  Indications: Swelling.  Risk Factors: None identified. Limitations: Poor ultrasound/tissue interface. Comparison Study: No prior studies. Performing Technologist: Chanda Busing RVT  Examination Guidelines: A complete evaluation  includes B-mode imaging, spectral Doppler, color Doppler, and power Doppler as needed of all accessible portions of each vessel. Bilateral testing is considered an integral part of a complete examination. Limited examinations for reoccurring indications may be performed as noted. The reflux portion of the exam is performed with the patient in reverse Trendelenburg.  +---------+---------------+---------+-----------+----------+--------------+ RIGHT    CompressibilityPhasicitySpontaneityPropertiesThrombus Aging +---------+---------------+---------+-----------+----------+--------------+ CFV      Full           Yes      Yes                                 +---------+---------------+---------+-----------+----------+--------------+ SFJ      Full                                                        +---------+---------------+---------+-----------+----------+--------------+ FV Prox  Full                                                        +---------+---------------+---------+-----------+----------+--------------+  PROGRESS NOTE    [name]  ZOX:[id number] DOB: 1931-09-02 DOA: 11/20/2022 PCP: Donita Brooks, MD    Brief Narrative:  89 year old with severe osteoarthritis on chronic prednisone therapy, essential hypertension, CKD stage IV presented with about 1 week of gradual onset dyspnea.  In the emergency room creatinine 2.6.  D-dimer more than 20.  No other explanation of shortness of breath.  VQ scan and duplex is positive for blood clot.  Subjective: Patient seen and examined.  Her son was at the bedside.  Patient denied any overnight events.  She has not ambulated.  Patient tells me that she has limitation on walking and has a lot of pain on her left knee.  Denies any chest pain or shortness of breath. Discussed with patient and her son about oral anticoagulation, findings on VQ scan and also with DVT.  They agreed to go on Eliquis. Patient is also worried about skin tear that happened with a blood pressure cuff yesterday in the ER.   Assessment & Plan:   Bilateral pulmonary embolism Left lower extremity acute DVT: Patient is on chronic prednisone therapy, also mostly immobile.   Echocardiogram with normal ejection fraction, she does have some evidence of right ventricular strain however with patient's stability, she will be best served with anticoagulation therapy.  Will not suggest any thrombolytics. Stop heparin.  Start Eliquis.  Mobilize with PT OT to evaluate safe disposition.  Troponin elevation: Due to above.  Acute coronary syndrome ruled out.  CKD stage IV: Repeat renal function test.  Likely her new baseline.  Essential hypertension: Blood pressure elevated.  Increase amlodipine from 5 mg to 10 mg.  Will monitor.  Osteoarthritis, severe and debilitating: On chronic prednisone therapy.  Continue.  Mobilize with PT OT.  Mood disorder: On Lexapro.  Skin tear right upper arm: Dry dressing.     DVT prophylaxis:  apixaban (ELIQUIS) tablet 10 mg  apixaban (ELIQUIS)  tablet 5 mg   Code Status: Full code Family Communication: Son at the bedside Disposition Plan: Status is: Inpatient Remains inpatient appropriate because: Acute PE and DVT, monitoring on therapeutics     Consultants:  None  Procedures:  None  Antimicrobials:  None     Objective: Vitals:   11/22/22 0130 11/22/22 0600 11/22/22 0815 11/22/22 0843  BP: (!) 148/72 (!) 135/97 (!) 158/80 (!) 156/68  Pulse: 73 69 83 79  Resp: 17 15 18 18   Temp: (!) 97.5 F (36.4 C) 98.3 F (36.8 C) 98.4 F (36.9 C) 97.9 F (36.6 C)  TempSrc: Axillary Oral Oral Oral  SpO2: 99% 100% 94% 100%  Weight:      Height:        Intake/Output Summary (Last 24 hours) at 11/22/2022 1052 Last data filed at 11/22/2022 0901 Gross per 24 hour  Intake 755.69 ml  Output --  Net 755.69 ml   Filed Weights   11/21/22 0307  Weight: 60.8 kg    Examination:  General exam: Appears calm and comfortable  Comfortable to interaction.  Pleasant. Respiratory system: Clear to auscultation. Respiratory effort normal.  No added sounds.  Mostly on room air. SpO2: 100 % O2 Flow Rate (L/min): 2 L/min FiO2 (%): (!) 2 %  Cardiovascular system: S1 & S2 heard, RRR.  Gastrointestinal system: Abdomen is nondistended, soft and nontender. No organomegaly or masses felt. Normal bowel sounds heard. Central nervous system: Alert and oriented. No focal neurological deficits. Extremities: Symmetric 5 x 5 power. Skin: Skin tear right upper arm. No

## 2022-11-22 NOTE — Plan of Care (Signed)
[facility] Tonsillectomy/Adenoidectomy, Postoperative PEDS care plan entered in error.

## 2022-11-22 NOTE — Plan of Care (Signed)
  Problem: Clinical Measurements: Goal: Ability to maintain clinical measurements within normal limits will improve Outcome: Progressing Goal: Respiratory complications will improve Outcome: Progressing   Problem: Nutrition: Goal: Adequate nutrition will be maintained Outcome: Progressing   Problem: Coping: Goal: Level of anxiety will decrease Outcome: Progressing   Problem: Safety: Goal: Ability to remain free from injury will improve Outcome: Progressing

## 2022-11-22 NOTE — Evaluation (Signed)
Physical Therapy Evaluation Patient Details Name: [name] MRN: [id number] DOB: 01-18-1932 Today's Date: 11/22/2022  History of Present Illness  89 y.o. female presents to [facility] hospital on 11/20/2022 with dyspnea. Pt found to have bilateral PE and LLE DVT. PMH includes OA, HTN, CKD IV.  Clinical Impression  Pt presents to PT with mild deficits in activity tolerance. Pt is able to ambulate for household distances with support of RW, demonstrating good oxygen saturation on room air. Pt reports improvement in joint pain compared to baseline, attributing this to the hospital bed mattress. Pt will benefit from continued frequent mobilization in an effort to improve activity tolerance and to maintain independence. Pt has no post-acute PT or DME needs at this time.        If plan is discharge home, recommend the following: Assistance with cooking/housework;Assist for transportation   Can travel by private vehicle        Equipment Recommendations None recommended by PT  Recommendations for Other Services       Functional Status Assessment Patient has had a recent decline in their functional status and demonstrates the ability to make significant improvements in function in a reasonable and predictable amount of time.     Precautions / Restrictions Precautions Precautions: Other (comment) Precaution Comments: monitor sats Restrictions Weight Bearing Restrictions: No      Mobility  Bed Mobility Overal bed mobility: Modified Independent                  Transfers Overall transfer level: Modified independent Equipment used: Rolling walker (2 wheels)                    Ambulation/Gait Ambulation/Gait assistance: Modified independent (Device/Increase time) Gait Distance (Feet): 150 Feet Assistive device: Rolling walker (2 wheels) Gait Pattern/deviations: Step-through pattern Gait velocity: reduced Gait velocity interpretation: 1.31 - 2.62 ft/sec, indicative  of limited community ambulator   General Gait Details: slowed step-through gait, reduced stance time on LLE with hyperextension in stance phase  Stairs            Wheelchair Mobility     Tilt Bed    Modified Rankin (Stroke Patients Only)       Balance Overall balance assessment: Needs assistance Sitting-balance support: No upper extremity supported, Feet supported Sitting balance-Leahy Scale: Good     Standing balance support: Single extremity supported, Reliant on assistive device for balance Standing balance-Leahy Scale: Poor                               Pertinent Vitals/Pain Pain Assessment Pain Assessment: Faces Faces Pain Scale: Hurts even more Pain Location: chronic pain from OA, primarily in L knee Pain Descriptors / Indicators: Aching Pain Intervention(s): Monitored during session    Home Living Family/patient expects to be discharged to:: Private residence Living Arrangements: Alone Available Help at Discharge: Family Type of Home: House Home Access: Ramped entrance       Home Layout: One level Home Equipment: Educational psychologist (4 wheels)      Prior Function Prior Level of Function : Independent/Modified Independent;Driving             Mobility Comments: ambulatory with rollator       Extremity/Trunk Assessment   Upper Extremity Assessment Upper Extremity Assessment: Overall WFL for tasks assessed    Lower Extremity Assessment Lower Extremity Assessment: LLE deficits/detail LLE Deficits / Details: chronic knee OA with excess hyperextension  at joint in stance phase of gait as well as varus at rest    Cervical / Trunk Assessment Cervical / Trunk Assessment: Normal  Communication   Communication Communication: Hearing impairment Cueing Techniques: Verbal cues  Cognition Arousal: Alert Behavior During Therapy: WFL for tasks assessed/performed Overall Cognitive Status: Within Functional Limits for tasks assessed                                           General Comments General comments (skin integrity, edema, etc.): pt on RA, SpO2 in mid 90s when ambulating    Exercises     Assessment/Plan    PT Assessment Patient needs continued PT services  PT Problem List Cardiopulmonary status limiting activity       PT Treatment Interventions DME instruction;Gait training;Functional mobility training;Therapeutic exercise;Therapeutic activities;Balance training;Patient/family education    PT Goals (Current goals can be found in the Care Plan section)  Acute Rehab PT Goals Patient Stated Goal: to return home, remain independent PT Goal Formulation: With patient Time For Goal Achievement: 12/06/22 Potential to Achieve Goals: Good Additional Goals Additional Goal #1: Pt will deny DOE when ambulating for >250' to demonstrate improved activity tolerance    Frequency Min 1X/week     Co-evaluation               AM-PAC PT "6 Clicks" Mobility  Outcome Measure Help needed turning from your back to your side while in a flat bed without using bedrails?: None Help needed moving from lying on your back to sitting on the side of a flat bed without using bedrails?: None Help needed moving to and from a bed to a chair (including a wheelchair)?: None Help needed standing up from a chair using your arms (e.g., wheelchair or bedside chair)?: None Help needed to walk in hospital room?: None Help needed climbing 3-5 steps with a railing? : A Little 6 Click Score: 23    End of Session   Activity Tolerance: Patient tolerated treatment well Patient left: in bed;with call bell/phone within reach Nurse Communication: Mobility status PT Visit Diagnosis: Other abnormalities of gait and mobility (R26.89)    Time: 0981-1914 PT Time Calculation (min) (ACUTE ONLY): 17 min   Charges:   PT Evaluation $PT Eval Low Complexity: 1 Low   PT General Charges $$ ACUTE PT VISIT: 1 Visit          Arlyss Gandy, PT, DPT Acute Rehabilitation Office [phone]   Arlyss Gandy 11/22/2022, 4:37 PM

## 2022-11-22 NOTE — Discharge Instructions (Signed)
Information on my medicine - ELIQUIS (apixaban)  This medication education was reviewed with me or my healthcare representative as part of my discharge preparation.    Why was Eliquis prescribed for you? Eliquis was prescribed to treat blood clots that may have been found in the veins of your legs (deep vein thrombosis) or in your lungs (pulmonary embolism) and to reduce the risk of them occurring again.  What do You need to know about Eliquis ? The starting dose is 10 mg (two 5 mg tablets) taken TWICE daily for the FIRST SEVEN (7) DAYS, then on (enter date)  11/29/22  the dose is reduced to ONE 5 mg tablet taken TWICE daily.  Eliquis may be taken with or without food.   Try to take the dose about the same time in the morning and in the evening. If you have difficulty swallowing the tablet whole please discuss with your pharmacist how to take the medication safely.  Take Eliquis exactly as prescribed and DO NOT stop taking Eliquis without talking to the doctor who prescribed the medication.  Stopping may increase your risk of developing a new blood clot.  Refill your prescription before you run out.  After discharge, you should have regular check-up appointments with your healthcare provider that is prescribing your Eliquis.    What do you do if you miss a dose? If a dose of ELIQUIS is not taken at the scheduled time, take it as soon as possible on the same day and twice-daily administration should be resumed. The dose should not be doubled to make up for a missed dose.  Important Safety Information A possible side effect of Eliquis is bleeding. You should call your healthcare provider right away if you experience any of the following: Bleeding from an injury or your nose that does not stop. Unusual colored urine (red or dark brown) or unusual colored stools (red or black). Unusual bruising for unknown reasons. A serious fall or if you hit your head (even if there is no  bleeding).  Some medicines may interact with Eliquis and might increase your risk of bleeding or clotting while on Eliquis. To help avoid this, consult your healthcare provider or pharmacist prior to using any new prescription or non-prescription medications, including herbals, vitamins, non-steroidal anti-inflammatory drugs (NSAIDs) and supplements.  This website has more information on Eliquis (apixaban): http://www.eliquis.com/eliquis/home

## 2022-11-22 NOTE — Plan of Care (Signed)

## 2022-11-22 NOTE — TOC Benefit Eligibility Note (Signed)
Patient Product/process development scientist completed.    The patient is insured through [facility]. Patient has Medicare and is not eligible for a copay card, but may be able to apply for patient assistance, if available.    Ran test claim for Eliquis 5 mg and the current 30 day co-pay is $0.00.   This test claim was processed through [facility]- copay amounts may vary at other pharmacies due to pharmacy/plan contracts, or as the patient moves through the different stages of their insurance plan.     Roland Earl, CPHT Pharmacy Technician III Certified Patient Advocate [facility] Pharm[facility] Patient Advocate Team Direct Number: [phone]  Fax: [phone]

## 2022-11-23 DIAGNOSIS — J9601 Acute respiratory failure with hypoxia: Secondary | ICD-10-CM | POA: Diagnosis not present

## 2022-11-23 MED ORDER — METOPROLOL TARTRATE 25 MG PO TABS: 12.5000 mg | ORAL_TABLET | Freq: Two times a day (BID) | ORAL | 0 refills | Status: DC

## 2022-11-23 MED ORDER — APIXABAN 5 MG PO TABS: ORAL_TABLET | ORAL | 0 refills | Status: DC

## 2022-11-23 NOTE — Plan of Care (Signed)
  Problem: Education: Goal: Knowledge of General Education information will improve Description: Including pain rating scale, medication(s)/side effects and non-pharmacologic comfort measures 11/23/2022 1222 by Redgie Grayer, RN Outcome: Adequate for Discharge 11/23/2022 1222 by Redgie Grayer, RN Outcome: Adequate for Discharge   Problem: Health Behavior/Discharge Planning: Goal: Ability to manage health-related needs will improve 11/23/2022 1222 by Redgie Grayer, RN Outcome: Adequate for Discharge 11/23/2022 1222 by Redgie Grayer, RN Outcome: Adequate for Discharge   Problem: Clinical Measurements: Goal: Ability to maintain clinical measurements within normal limits will improve 11/23/2022 1222 by Redgie Grayer, RN Outcome: Adequate for Discharge 11/23/2022 1222 by Redgie Grayer, RN Outcome: Adequate for Discharge Goal: Will remain free from infection 11/23/2022 1222 by Redgie Grayer, RN Outcome: Adequate for Discharge 11/23/2022 1222 by Redgie Grayer, RN Outcome: Adequate for Discharge Goal: Diagnostic test results will improve 11/23/2022 1222 by Redgie Grayer, RN Outcome: Adequate for Discharge 11/23/2022 1222 by Redgie Grayer, RN Outcome: Adequate for Discharge Goal: Respiratory complications will improve 11/23/2022 1222 by Redgie Grayer, RN Outcome: Adequate for Discharge 11/23/2022 1222 by Redgie Grayer, RN Outcome: Adequate for Discharge Goal: Cardiovascular complication will be avoided 11/23/2022 1222 by Redgie Grayer, RN Outcome: Adequate for Discharge 11/23/2022 1222 by Redgie Grayer, RN Outcome: Adequate for Discharge   Problem: Activity: Goal: Risk for activity intolerance will decrease 11/23/2022 1222 by Redgie Grayer, RN Outcome: Adequate for Discharge 11/23/2022 1222 by Redgie Grayer, RN Outcome: Adequate for Discharge   Problem: Nutrition: Goal: Adequate nutrition will be maintained 11/23/2022 1222 by Redgie Grayer, RN Outcome: Adequate for  Discharge 11/23/2022 1222 by Redgie Grayer, RN Outcome: Adequate for Discharge   Problem: Coping: Goal: Level of anxiety will decrease 11/23/2022 1222 by Redgie Grayer, RN Outcome: Adequate for Discharge 11/23/2022 1222 by Redgie Grayer, RN Outcome: Adequate for Discharge   Problem: Elimination: Goal: Will not experience complications related to bowel motility 11/23/2022 1222 by Redgie Grayer, RN Outcome: Adequate for Discharge 11/23/2022 1222 by Redgie Grayer, RN Outcome: Adequate for Discharge Goal: Will not experience complications related to urinary retention 11/23/2022 1222 by Redgie Grayer, RN Outcome: Adequate for Discharge 11/23/2022 1222 by Redgie Grayer, RN Outcome: Adequate for Discharge   Problem: Pain Managment: Goal: General experience of comfort will improve 11/23/2022 1222 by Redgie Grayer, RN Outcome: Adequate for Discharge 11/23/2022 1222 by Redgie Grayer, RN Outcome: Adequate for Discharge   Problem: Safety: Goal: Ability to remain free from injury will improve 11/23/2022 1222 by Redgie Grayer, RN Outcome: Adequate for Discharge 11/23/2022 1222 by Redgie Grayer, RN Outcome: Adequate for Discharge   Problem: Skin Integrity: Goal: Risk for impaired skin integrity will decrease 11/23/2022 1222 by Redgie Grayer, RN Outcome: Adequate for Discharge 11/23/2022 1222 by Redgie Grayer, RN Outcome: Adequate for Discharge

## 2022-11-23 NOTE — Plan of Care (Signed)

## 2022-11-23 NOTE — Discharge Summary (Signed)
tablet Commonly known as: ELIQUIS Take 2 tablets (10 mg total) by mouth 2 (two) times daily for 6 days, THEN 1 tablet (5 mg total) 2 (two) times daily for 25 days. Start taking on: November 23, 2022   denosumab 60 MG/ML Sosy injection Commonly known as: PROLIA Inject 60 mg into the skin every 6 (six) months.   diclofenac Sodium 1 % Gel Commonly known as: VOLTAREN Apply topically 4 (four) times daily.   escitalopram 10 MG tablet Commonly known as: LEXAPRO Take 1 tablet (10 mg total) by mouth daily.   metoprolol tartrate 25 MG tablet Commonly known as: LOPRESSOR Take 0.5 tablets (12.5 mg total) by mouth 2 (two) times daily.   predniSONE 10 MG tablet Commonly known as: DELTASONE TAKE ONE TABLET BY MOUTH ONCE A DAY WITH BREAKFAST What changed:  how much to take how to take this when to take this additional instructions   traMADol 50 MG tablet Commonly known as: ULTRAM Take 1 tablet (50 mg total) by mouth every 6 (six) hours as needed.         Follow-up Information     Donita Brooks, MD. Schedule an appointment as soon as possible for a visit.   Specialty: Family Medicine Why: Call the office and schedule a hospital follow up in the next week. Contact information: [address] [place] [address] [phone]                 Major procedures and Radiology Reports - PLEASE review detailed and final reports thoroughly  -       VAS [place] LOWER EXTREMITY VENOUS (DVT)  Result Date: 11/21/2022  Lower Venous DVT Study Patient Name:  [name]  Date of Exam:   11/21/2022 Medical Rec #: [id number]          Accession #:    [id number] Date of Birth: May 30, 1931          Patient Gender: F Patient Age:   89 years Exam Location:  [facility] Procedure:      VAS [place] LOWER EXTREMITY VENOUS (DVT)  Referring Phys: Jeoffrey Massed --------------------------------------------------------------------------------  Indications: Swelling.  Risk Factors: None identified. Limitations: Poor ultrasound/tissue interface. Comparison Study: No prior studies. Performing Technologist: Chanda Busing RVT  Examination Guidelines: A complete evaluation includes B-mode imaging, spectral Doppler, color Doppler, and power Doppler as needed of all accessible portions of each vessel. Bilateral testing is considered an integral part of a complete examination. Limited examinations for reoccurring indications may be performed as noted. The reflux portion of the exam is performed with the patient in reverse Trendelenburg.  +---------+---------------+---------+-----------+----------+--------------+ RIGHT    CompressibilityPhasicitySpontaneityPropertiesThrombus Aging +---------+---------------+---------+-----------+----------+--------------+ CFV      Full           Yes      Yes                                 +---------+---------------+---------+-----------+----------+--------------+ SFJ      Full                                                        +---------+---------------+---------+-----------+----------+--------------+ FV Prox  Full                                                        +---------+---------------+---------+-----------+----------+--------------+  FV DistalPartial        Yes      Yes                  Acute          +---------+---------------+---------+-----------+----------+--------------+ PFV      Full                                                        +---------+---------------+---------+-----------+----------+--------------+ POP      None           No       No                   Acute          +---------+---------------+---------+-----------+----------+--------------+ PTV      Partial                                      Acute          +---------+---------------+---------+-----------+----------+--------------+ PERO     Full                                                         +---------+---------------+---------+-----------+----------+--------------+     Summary: RIGHT: - There is no evidence of deep vein thrombosis in the lower extremity.  - No cystic structure found in the popliteal fossa.  LEFT: - Findings consistent with acute deep vein thrombosis involving the left femoral vein, left popliteal vein, and left posterior tibial veins.  - No cystic structure found in the popliteal fossa.  *See table(s) above for measurements and observations. Electronically signed by Heath Lark on 11/21/2022 at 4:33:30 PM.    Final    ECHOCARDIOGRAM COMPLETE  Result Date: 11/21/2022    ECHOCARDIOGRAM REPORT   Patient Name:   [name] Date of Exam: 11/21/2022 Medical Rec #:  [id number]         Height:       63.0 in Accession #:    [id number]        Weight:       134.1 lb Date of Birth:  January 19, 1932         BSA:          1.631 m Patient Age:    89 years          BP:           129/74 mmHg Patient Gender: F                 HR:           76 bpm. Exam Location:  Inpatient Procedure: 2D Echo, Cardiac Doppler and Color Doppler Indications:    I26.02 Pulmonary embolus  History:        Patient has prior history of Echocardiogram examinations, most                 recent 03/06/2005. Signs/Symptoms:Shortness of Breath; Risk                 Factors:Hypertension. Hypoxia.  Sonographer:  FV DistalPartial        Yes      Yes                  Acute          +---------+---------------+---------+-----------+----------+--------------+ PFV      Full                                                        +---------+---------------+---------+-----------+----------+--------------+ POP      None           No       No                   Acute          +---------+---------------+---------+-----------+----------+--------------+ PTV      Partial                                      Acute          +---------+---------------+---------+-----------+----------+--------------+ PERO     Full                                                         +---------+---------------+---------+-----------+----------+--------------+     Summary: RIGHT: - There is no evidence of deep vein thrombosis in the lower extremity.  - No cystic structure found in the popliteal fossa.  LEFT: - Findings consistent with acute deep vein thrombosis involving the left femoral vein, left popliteal vein, and left posterior tibial veins.  - No cystic structure found in the popliteal fossa.  *See table(s) above for measurements and observations. Electronically signed by Heath Lark on 11/21/2022 at 4:33:30 PM.    Final    ECHOCARDIOGRAM COMPLETE  Result Date: 11/21/2022    ECHOCARDIOGRAM REPORT   Patient Name:   [name] Date of Exam: 11/21/2022 Medical Rec #:  [id number]         Height:       63.0 in Accession #:    [id number]        Weight:       134.1 lb Date of Birth:  January 19, 1932         BSA:          1.631 m Patient Age:    89 years          BP:           129/74 mmHg Patient Gender: F                 HR:           76 bpm. Exam Location:  Inpatient Procedure: 2D Echo, Cardiac Doppler and Color Doppler Indications:    I26.02 Pulmonary embolus  History:        Patient has prior history of Echocardiogram examinations, most                 recent 03/06/2005. Signs/Symptoms:Shortness of Breath; Risk                 Factors:Hypertension. Hypoxia.  Sonographer:  tablet Commonly known as: ELIQUIS Take 2 tablets (10 mg total) by mouth 2 (two) times daily for 6 days, THEN 1 tablet (5 mg total) 2 (two) times daily for 25 days. Start taking on: November 23, 2022   denosumab 60 MG/ML Sosy injection Commonly known as: PROLIA Inject 60 mg into the skin every 6 (six) months.   diclofenac Sodium 1 % Gel Commonly known as: VOLTAREN Apply topically 4 (four) times daily.   escitalopram 10 MG tablet Commonly known as: LEXAPRO Take 1 tablet (10 mg total) by mouth daily.   metoprolol tartrate 25 MG tablet Commonly known as: LOPRESSOR Take 0.5 tablets (12.5 mg total) by mouth 2 (two) times daily.   predniSONE 10 MG tablet Commonly known as: DELTASONE TAKE ONE TABLET BY MOUTH ONCE A DAY WITH BREAKFAST What changed:  how much to take how to take this when to take this additional instructions   traMADol 50 MG tablet Commonly known as: ULTRAM Take 1 tablet (50 mg total) by mouth every 6 (six) hours as needed.         Follow-up Information     Donita Brooks, MD. Schedule an appointment as soon as possible for a visit.   Specialty: Family Medicine Why: Call the office and schedule a hospital follow up in the next week. Contact information: [address] [place] [address] [phone]                 Major procedures and Radiology Reports - PLEASE review detailed and final reports thoroughly  -       VAS [place] LOWER EXTREMITY VENOUS (DVT)  Result Date: 11/21/2022  Lower Venous DVT Study Patient Name:  [name]  Date of Exam:   11/21/2022 Medical Rec #: [id number]          Accession #:    [id number] Date of Birth: May 30, 1931          Patient Gender: F Patient Age:   89 years Exam Location:  [facility] Procedure:      VAS [place] LOWER EXTREMITY VENOUS (DVT)  Referring Phys: Jeoffrey Massed --------------------------------------------------------------------------------  Indications: Swelling.  Risk Factors: None identified. Limitations: Poor ultrasound/tissue interface. Comparison Study: No prior studies. Performing Technologist: Chanda Busing RVT  Examination Guidelines: A complete evaluation includes B-mode imaging, spectral Doppler, color Doppler, and power Doppler as needed of all accessible portions of each vessel. Bilateral testing is considered an integral part of a complete examination. Limited examinations for reoccurring indications may be performed as noted. The reflux portion of the exam is performed with the patient in reverse Trendelenburg.  +---------+---------------+---------+-----------+----------+--------------+ RIGHT    CompressibilityPhasicitySpontaneityPropertiesThrombus Aging +---------+---------------+---------+-----------+----------+--------------+ CFV      Full           Yes      Yes                                 +---------+---------------+---------+-----------+----------+--------------+ SFJ      Full                                                        +---------+---------------+---------+-----------+----------+--------------+ FV Prox  Full                                                        +---------+---------------+---------+-----------+----------+--------------+  tablet Commonly known as: ELIQUIS Take 2 tablets (10 mg total) by mouth 2 (two) times daily for 6 days, THEN 1 tablet (5 mg total) 2 (two) times daily for 25 days. Start taking on: November 23, 2022   denosumab 60 MG/ML Sosy injection Commonly known as: PROLIA Inject 60 mg into the skin every 6 (six) months.   diclofenac Sodium 1 % Gel Commonly known as: VOLTAREN Apply topically 4 (four) times daily.   escitalopram 10 MG tablet Commonly known as: LEXAPRO Take 1 tablet (10 mg total) by mouth daily.   metoprolol tartrate 25 MG tablet Commonly known as: LOPRESSOR Take 0.5 tablets (12.5 mg total) by mouth 2 (two) times daily.   predniSONE 10 MG tablet Commonly known as: DELTASONE TAKE ONE TABLET BY MOUTH ONCE A DAY WITH BREAKFAST What changed:  how much to take how to take this when to take this additional instructions   traMADol 50 MG tablet Commonly known as: ULTRAM Take 1 tablet (50 mg total) by mouth every 6 (six) hours as needed.         Follow-up Information     Donita Brooks, MD. Schedule an appointment as soon as possible for a visit.   Specialty: Family Medicine Why: Call the office and schedule a hospital follow up in the next week. Contact information: [address] [place] [address] [phone]                 Major procedures and Radiology Reports - PLEASE review detailed and final reports thoroughly  -       VAS [place] LOWER EXTREMITY VENOUS (DVT)  Result Date: 11/21/2022  Lower Venous DVT Study Patient Name:  [name]  Date of Exam:   11/21/2022 Medical Rec #: [id number]          Accession #:    [id number] Date of Birth: May 30, 1931          Patient Gender: F Patient Age:   89 years Exam Location:  [facility] Procedure:      VAS [place] LOWER EXTREMITY VENOUS (DVT)  Referring Phys: Jeoffrey Massed --------------------------------------------------------------------------------  Indications: Swelling.  Risk Factors: None identified. Limitations: Poor ultrasound/tissue interface. Comparison Study: No prior studies. Performing Technologist: Chanda Busing RVT  Examination Guidelines: A complete evaluation includes B-mode imaging, spectral Doppler, color Doppler, and power Doppler as needed of all accessible portions of each vessel. Bilateral testing is considered an integral part of a complete examination. Limited examinations for reoccurring indications may be performed as noted. The reflux portion of the exam is performed with the patient in reverse Trendelenburg.  +---------+---------------+---------+-----------+----------+--------------+ RIGHT    CompressibilityPhasicitySpontaneityPropertiesThrombus Aging +---------+---------------+---------+-----------+----------+--------------+ CFV      Full           Yes      Yes                                 +---------+---------------+---------+-----------+----------+--------------+ SFJ      Full                                                        +---------+---------------+---------+-----------+----------+--------------+ FV Prox  Full                                                        +---------+---------------+---------+-----------+----------+--------------+  tablet Commonly known as: ELIQUIS Take 2 tablets (10 mg total) by mouth 2 (two) times daily for 6 days, THEN 1 tablet (5 mg total) 2 (two) times daily for 25 days. Start taking on: November 23, 2022   denosumab 60 MG/ML Sosy injection Commonly known as: PROLIA Inject 60 mg into the skin every 6 (six) months.   diclofenac Sodium 1 % Gel Commonly known as: VOLTAREN Apply topically 4 (four) times daily.   escitalopram 10 MG tablet Commonly known as: LEXAPRO Take 1 tablet (10 mg total) by mouth daily.   metoprolol tartrate 25 MG tablet Commonly known as: LOPRESSOR Take 0.5 tablets (12.5 mg total) by mouth 2 (two) times daily.   predniSONE 10 MG tablet Commonly known as: DELTASONE TAKE ONE TABLET BY MOUTH ONCE A DAY WITH BREAKFAST What changed:  how much to take how to take this when to take this additional instructions   traMADol 50 MG tablet Commonly known as: ULTRAM Take 1 tablet (50 mg total) by mouth every 6 (six) hours as needed.         Follow-up Information     Donita Brooks, MD. Schedule an appointment as soon as possible for a visit.   Specialty: Family Medicine Why: Call the office and schedule a hospital follow up in the next week. Contact information: [address] [place] [address] [phone]                 Major procedures and Radiology Reports - PLEASE review detailed and final reports thoroughly  -       VAS [place] LOWER EXTREMITY VENOUS (DVT)  Result Date: 11/21/2022  Lower Venous DVT Study Patient Name:  [name]  Date of Exam:   11/21/2022 Medical Rec #: [id number]          Accession #:    [id number] Date of Birth: May 30, 1931          Patient Gender: F Patient Age:   89 years Exam Location:  [facility] Procedure:      VAS [place] LOWER EXTREMITY VENOUS (DVT)  Referring Phys: Jeoffrey Massed --------------------------------------------------------------------------------  Indications: Swelling.  Risk Factors: None identified. Limitations: Poor ultrasound/tissue interface. Comparison Study: No prior studies. Performing Technologist: Chanda Busing RVT  Examination Guidelines: A complete evaluation includes B-mode imaging, spectral Doppler, color Doppler, and power Doppler as needed of all accessible portions of each vessel. Bilateral testing is considered an integral part of a complete examination. Limited examinations for reoccurring indications may be performed as noted. The reflux portion of the exam is performed with the patient in reverse Trendelenburg.  +---------+---------------+---------+-----------+----------+--------------+ RIGHT    CompressibilityPhasicitySpontaneityPropertiesThrombus Aging +---------+---------------+---------+-----------+----------+--------------+ CFV      Full           Yes      Yes                                 +---------+---------------+---------+-----------+----------+--------------+ SFJ      Full                                                        +---------+---------------+---------+-----------+----------+--------------+ FV Prox  Full                                                        +---------+---------------+---------+-----------+----------+--------------+  FV DistalPartial        Yes      Yes                  Acute          +---------+---------------+---------+-----------+----------+--------------+ PFV      Full                                                        +---------+---------------+---------+-----------+----------+--------------+ POP      None           No       No                   Acute          +---------+---------------+---------+-----------+----------+--------------+ PTV      Partial                                      Acute          +---------+---------------+---------+-----------+----------+--------------+ PERO     Full                                                         +---------+---------------+---------+-----------+----------+--------------+     Summary: RIGHT: - There is no evidence of deep vein thrombosis in the lower extremity.  - No cystic structure found in the popliteal fossa.  LEFT: - Findings consistent with acute deep vein thrombosis involving the left femoral vein, left popliteal vein, and left posterior tibial veins.  - No cystic structure found in the popliteal fossa.  *See table(s) above for measurements and observations. Electronically signed by Heath Lark on 11/21/2022 at 4:33:30 PM.    Final    ECHOCARDIOGRAM COMPLETE  Result Date: 11/21/2022    ECHOCARDIOGRAM REPORT   Patient Name:   [name] Date of Exam: 11/21/2022 Medical Rec #:  [id number]         Height:       63.0 in Accession #:    [id number]        Weight:       134.1 lb Date of Birth:  January 19, 1932         BSA:          1.631 m Patient Age:    89 years          BP:           129/74 mmHg Patient Gender: F                 HR:           76 bpm. Exam Location:  Inpatient Procedure: 2D Echo, Cardiac Doppler and Color Doppler Indications:    I26.02 Pulmonary embolus  History:        Patient has prior history of Echocardiogram examinations, most                 recent 03/06/2005. Signs/Symptoms:Shortness of Breath; Risk                 Factors:Hypertension. Hypoxia.  Sonographer:  tablet Commonly known as: ELIQUIS Take 2 tablets (10 mg total) by mouth 2 (two) times daily for 6 days, THEN 1 tablet (5 mg total) 2 (two) times daily for 25 days. Start taking on: November 23, 2022   denosumab 60 MG/ML Sosy injection Commonly known as: PROLIA Inject 60 mg into the skin every 6 (six) months.   diclofenac Sodium 1 % Gel Commonly known as: VOLTAREN Apply topically 4 (four) times daily.   escitalopram 10 MG tablet Commonly known as: LEXAPRO Take 1 tablet (10 mg total) by mouth daily.   metoprolol tartrate 25 MG tablet Commonly known as: LOPRESSOR Take 0.5 tablets (12.5 mg total) by mouth 2 (two) times daily.   predniSONE 10 MG tablet Commonly known as: DELTASONE TAKE ONE TABLET BY MOUTH ONCE A DAY WITH BREAKFAST What changed:  how much to take how to take this when to take this additional instructions   traMADol 50 MG tablet Commonly known as: ULTRAM Take 1 tablet (50 mg total) by mouth every 6 (six) hours as needed.         Follow-up Information     Donita Brooks, MD. Schedule an appointment as soon as possible for a visit.   Specialty: Family Medicine Why: Call the office and schedule a hospital follow up in the next week. Contact information: [address] [place] [address] [phone]                 Major procedures and Radiology Reports - PLEASE review detailed and final reports thoroughly  -       VAS [place] LOWER EXTREMITY VENOUS (DVT)  Result Date: 11/21/2022  Lower Venous DVT Study Patient Name:  [name]  Date of Exam:   11/21/2022 Medical Rec #: [id number]          Accession #:    [id number] Date of Birth: May 30, 1931          Patient Gender: F Patient Age:   89 years Exam Location:  [facility] Procedure:      VAS [place] LOWER EXTREMITY VENOUS (DVT)  Referring Phys: Jeoffrey Massed --------------------------------------------------------------------------------  Indications: Swelling.  Risk Factors: None identified. Limitations: Poor ultrasound/tissue interface. Comparison Study: No prior studies. Performing Technologist: Chanda Busing RVT  Examination Guidelines: A complete evaluation includes B-mode imaging, spectral Doppler, color Doppler, and power Doppler as needed of all accessible portions of each vessel. Bilateral testing is considered an integral part of a complete examination. Limited examinations for reoccurring indications may be performed as noted. The reflux portion of the exam is performed with the patient in reverse Trendelenburg.  +---------+---------------+---------+-----------+----------+--------------+ RIGHT    CompressibilityPhasicitySpontaneityPropertiesThrombus Aging +---------+---------------+---------+-----------+----------+--------------+ CFV      Full           Yes      Yes                                 +---------+---------------+---------+-----------+----------+--------------+ SFJ      Full                                                        +---------+---------------+---------+-----------+----------+--------------+ FV Prox  Full                                                        +---------+---------------+---------+-----------+----------+--------------+

## 2022-11-25 ENCOUNTER — Telehealth: Payer: Self-pay

## 2022-11-25 NOTE — Transitions of Care (Post Inpatient/ED Visit) (Signed)
11/25/2022  Name: [name] MRN: [id number] DOB: [birth year]  Today's TOC FU Call Status: Today's TOC FU Call Status:: Unsuccessful Call (1st Attempt) Unsuccessful Call (1st Attempt) Date: 11/25/22  Attempted to reach the patient regarding the most recent Inpatient/ED visit.  Follow Up Plan: Additional outreach attempts will be made to reach the patient to complete the Transitions of Care (Post Inpatient/ED visit) call.   Lonia Chimera, RN, BSN, CEN [employer]- Transition of Care Team.  [facility] [phone]

## 2022-11-26 ENCOUNTER — Inpatient Hospital Stay: Payer: 59 | Admitting: Family Medicine

## 2022-11-29 ENCOUNTER — Encounter: Payer: Self-pay | Admitting: Family Medicine

## 2022-11-29 ENCOUNTER — Telehealth: Payer: Self-pay

## 2022-11-29 ENCOUNTER — Ambulatory Visit (INDEPENDENT_AMBULATORY_CARE_PROVIDER_SITE_OTHER): Payer: 59 | Admitting: Family Medicine

## 2022-11-29 VITALS — BP 122/54 | HR 64 | Temp 98.5°F | Ht 63.0 in | Wt 137.0 lb

## 2022-11-29 DIAGNOSIS — F5101 Primary insomnia: Secondary | ICD-10-CM

## 2022-11-29 DIAGNOSIS — I2699 Other pulmonary embolism without acute cor pulmonale: Secondary | ICD-10-CM | POA: Insufficient documentation

## 2022-11-29 DIAGNOSIS — I10 Essential (primary) hypertension: Secondary | ICD-10-CM

## 2022-11-29 DIAGNOSIS — M51369 Other intervertebral disc degeneration, lumbar region without mention of lumbar back pain or lower extremity pain: Secondary | ICD-10-CM | POA: Diagnosis not present

## 2022-11-29 DIAGNOSIS — N1832 Chronic kidney disease, stage 3b: Secondary | ICD-10-CM

## 2022-11-29 MED ORDER — TRAMADOL HCL 50 MG PO TABS: 50.0000 mg | ORAL_TABLET | Freq: Four times a day (QID) | ORAL | 2 refills | Status: DC | PRN

## 2022-11-29 MED ORDER — APIXABAN 2.5 MG PO TABS: 2.5000 mg | ORAL_TABLET | Freq: Two times a day (BID) | ORAL | 11 refills | Status: DC

## 2022-11-29 MED ORDER — PREDNISONE 10 MG PO TABS: ORAL_TABLET | ORAL | 6 refills | Status: DC

## 2022-11-29 MED ORDER — ALPRAZOLAM 0.5 MG PO TABS: 0.5000 mg | ORAL_TABLET | Freq: Three times a day (TID) | ORAL | 2 refills | Status: DC | PRN

## 2022-11-29 NOTE — Transitions of Care (Post Inpatient/ED Visit) (Signed)
11/29/2022  Name: [name] MRN: [id number] DOB: [birth year]  Today's TOC FU Call Status: Today's TOC FU Call Status:: Unsuccessful Call (2nd Attempt) Unsuccessful Call (2nd Attempt) Date: 11/29/22  Attempted to reach the patient regarding the most recent Inpatient/ED visit.  Follow Up Plan: Additional outreach attempts will be made to reach the patient to complete the Transitions of Care (Post Inpatient/ED visit) call.   Lonia Chimera, RN, BSN, CEN [employer]- Transition of Care Team.  [facility] [phone]

## 2022-11-29 NOTE — Progress Notes (Signed)
Subjective:    Patient ID: [name], female    DOB: [birth year], 89 y.o.   MRN: [id number]  dmit date: 11/20/2022  Discharge date: 11/23/2022   Admitted From: Home   disposition: Home     Recommendations for Outpatient Follow-up:    Follow up with PCP in 1 week to follow-up on blood pressure given initiation of metoprolol and discontinuation of losartan, to continue monitoring of DOAC/anticoagulation and to follow up on kidney function.   PCP Please obtain BMP with particular attention to creatinine.     Home Health: N/A Equipment/Devices: At home Consultations: Cardiology Discharge Condition: Improved CODE STATUS: Full Diet Recommendation: Heart Healthy    Diet Order                  Diet - low sodium heart healthy             Diet Heart Room service appropriate? Yes; Fluid consistency: Thin  Diet effective now                            Chief Complaint  Patient presents with   Shortness of Breath      Brief history of present illness from the day of admission and additional interim summary          [name] is a 89 y.o. female with medical history significant of HTN, CKD 4, chronic prednisone-for ? probable OA presenting to the ED for evaluation of exertional dyspnea.   Per patient-for the past 1 week or so-she has noted a gradual onset dyspnea.  Dyspnea is mostly on exertion-she does not have dyspnea at rest-does not have orthopnea.  There is no swelling of the legs.  Per patient-she noted most of these symptoms after she had a flu shot approximately 10-14 days back. Patient denies any fever, cough. No recent sick contacts   Because of persistent exertional dyspnea-she went to her PCPs office-and was promptly referred to the ED.   While at the ED-patient to be underwent workup-D-dimer was> 20, chest x-ray did not show any significant abnormalities.  VQ scan was positive as was her duplex Doppler.  IV heparin was started.                                                                   Hospital Course    Patient was admitted with bilateral pulmonary embolism and left lower extremity acute DVT.  Patient is mostly immobile and is on chronic prednisone therapy.  Echocardiogram shows normal EF with some evidence of right RV strain but no indication for thrombolytics.  Patient received IV heparin for 24 hours and was transitioned over to Eliquis.  Patient was weaned off any supplemental oxygen.  Patient was seen by cardiology for DOE but with diagnosis of positive PE they signed off.  They did however change her Norvasc to low-dose beta-blocker given hyperdynamic LV with cavity gradient.  Patient is discharged home to follow-up with her PCP for ongoing management of anticoagulation for bilateral PE and for blood pressure management with change in her antihypertensives.    Bilateral pulmonary embolism Left lower extremity acute DVT: Patient is on chronic prednisone therapy, also mostly immobile.  Echocardiogram with normal ejection fraction, she does have some evidence of right ventricular strain however with patient's stability, she will be best served with anticoagulation therapy.  Will not suggest any thrombolytics. Stop heparin.  Start Eliquis.     Troponin elevation: Due to above.  Acute coronary syndrome ruled out.   CKD stage IV: Repeat renal function test as an outpatient.  Likely her new baseline.   Essential hypertension: Losartan was discontinued.  Patient started on beta-blocker to be titrated up as an outpatient.   Osteoarthritis, severe and debilitating: On chronic prednisone therapy.  Ongoing outpatient therapy   Mood disorder: On Lexapro.         Discharge diagnosis       Principal Problem:   Acute respiratory failure with hypoxia (HCC) Active Problems:   Essential hypertension   Bilateral primary osteoarthritis of knee   CKD (chronic kidney disease) stage 4, GFR 15-29 ml/min (HCC)     Patient is here today  with her son for follow-up.  She denies any shortness of breath but she states that she has been having severe pain in her left knee that she attributed to her osteoarthritis however that improved after starting to take the blood thinner.  Therefore I believe that may have been the originating spot of the blood clot.  She denies any fevers or chills.  She denies any pleurisy or hemoptysis.  She denies any dysuria or urgency.  Her creatinine in the hospital was greater than 2.  She is 89 years old.  Therefore the recommended dose of Eliquis would be 2-1/2 mg twice daily.  She is currently on 5 mg twice daily due to the presence of a pulmonary embolism Past Medical History:  Diagnosis Date   Cataract    CKD (chronic kidney disease) stage 3, GFR 30-59 ml/min (HCC)    Fatty liver disease, nonalcoholic    2007- admitted with encephalopathy unsure of cause, resolved sponatneously   Hypertension    Past Surgical History:  Procedure Laterality Date   FRACTURE SURGERY     right shoulder, both wrists    JOINT REPLACEMENT     HIP- bilateral   Current Outpatient Medications on File Prior to Visit  Medication Sig Dispense Refill   ALPRAZolam (XANAX) 0.5 MG tablet Take 1 tablet (0.5 mg total) by mouth 3 (three) times daily as needed. for anxiety 30 tablet 2   amLODipine (NORVASC) 5 MG tablet TAKE ONE TABLET BY MOUTH ONCE A DAY 90 tablet 1   apixaban (ELIQUIS) 5 MG TABS tablet Take 2 tablets (10 mg total) by mouth 2 (two) times daily for 6 days, THEN 1 tablet (5 mg total) 2 (two) times daily for 25 days. 74 tablet 0   denosumab (PROLIA) 60 MG/ML SOSY injection Inject 60 mg into the skin every 6 (six) months.     diclofenac Sodium (VOLTAREN) 1 % GEL Apply topically 4 (four) times daily.     escitalopram (LEXAPRO) 10 MG tablet Take 1 tablet (10 mg total) by mouth daily. 90 tablet 3   metoprolol tartrate (LOPRESSOR) 25 MG tablet Take 0.5 tablets (12.5 mg total) by mouth 2 (two) times daily. 30 tablet 0    predniSONE (DELTASONE) 10 MG tablet TAKE ONE TABLET BY MOUTH ONCE A DAY WITH BREAKFAST (Patient taking differently: Take 10 mg by mouth daily with breakfast.) 30 tablet 6   traMADol (ULTRAM) 50 MG tablet Take 1 tablet (50 mg total) by mouth every 6 (six) hours as needed. 120 tablet  2   No current facility-administered medications on file prior to visit.       Allergies  Allergen Reactions   Pneumococcal Vaccines Other (See Comments)    Pt had localized reaction to injection of Pneumococcal 23 - Swelling and redness in upper arm that extend to but not beyond elbow   Social History   Socioeconomic History   Marital status: Widowed    Spouse name: Not on file   Number of children: 1   Years of education: Not on file   Highest education level: Not on file  Occupational History   Not on file  Tobacco Use   Smoking status: Never   Smokeless tobacco: Never  Vaping Use   Vaping status: Never Used  Substance and Sexual Activity   Alcohol use: Not Currently   Drug use: Never   Sexual activity: Not Currently    Comment: raised tobacco, retired  Other Topics Concern   Not on file  Social History Narrative   Lives alone   Right handed    Caffeine: 1/2-1 cup coffee in AM   Social Determinants of Health   Financial Resource Strain: Low Risk  (04/09/2022)   Overall Financial Resource Strain (CARDIA)    Difficulty of Paying Living Expenses: Not hard at all  Food Insecurity: No Food Insecurity (11/21/2022)   Hunger Vital Sign    Worried About Running Out of Food in the Last Year: Never true    Ran Out of Food in the Last Year: Never true  Transportation Needs: No Transportation Needs (11/21/2022)   PRAPARE - Administrator, Civil Service (Medical): No    Lack of Transportation (Non-Medical): No  Physical Activity: Inactive (04/09/2022)   Exercise Vital Sign    Days of Exercise per Week: 0 days    Minutes of Exercise per Session: 0 min  Stress: No Stress Concern Present  (04/09/2022)   [employer] of Occupational Health - Occupational Stress Questionnaire    Feeling of Stress : Not at all  Social Connections: Moderately Isolated (04/09/2022)   Social Connection and Isolation Panel [NHANES]    Frequency of Communication with Friends and Family: More than three times a week    Frequency of Social Gatherings with Friends and Family: More than three times a week    Attends Religious Services: 1 to 4 times per year    Active Member of [employer] or Organizations: No    Attends Banker Meetings: Never    Marital Status: Widowed  Intimate Partner Violence: Not At Risk (11/21/2022)   Humiliation, Afraid, Rape, and Kick questionnaire    Fear of Current or Ex-Partner: No    Emotionally Abused: No    Physically Abused: No    Sexually Abused: No     Review of Systems  All other systems reviewed and are negative.      Objective:   Physical Exam Vitals reviewed.  Constitutional:      General: She is not in acute distress.    Appearance: She is well-developed and normal weight. She is not ill-appearing, toxic-appearing or diaphoretic.  HENT:     Mouth/Throat:     Mouth: Mucous membranes are dry.     Pharynx: No oropharyngeal exudate or posterior oropharyngeal erythema.  Eyes:     Extraocular Movements: Extraocular movements intact.     Conjunctiva/sclera: Conjunctivae normal.  Cardiovascular:     Rate and Rhythm: Regular rhythm. Tachycardia present.     Heart sounds: Normal heart  sounds. No murmur heard.    No friction rub. No gallop.  Pulmonary:     Effort: Pulmonary effort is normal. No respiratory distress.     Breath sounds: Normal breath sounds. No stridor. No wheezing or rales.  Abdominal:     General: Bowel sounds are normal. There is no distension.     Palpations: Abdomen is soft.     Tenderness: There is no abdominal tenderness. There is no guarding or rebound.  Musculoskeletal:     Lumbar back: Tenderness present. Decreased  range of motion.     Right hip: Decreased range of motion.     Left hip: Decreased range of motion.     Right knee: Decreased range of motion. No MCL or LCL tenderness.     Left knee: Decreased range of motion.     Right lower leg: No edema.     Left lower leg: No edema.  Lymphadenopathy:     Cervical: No cervical adenopathy.  Skin:    Findings: No erythema.  Neurological:     General: No focal deficit present.     Mental Status: She is alert and oriented to person, place, and time.     Cranial Nerves: No cranial nerve deficit.     Motor: No weakness.     Coordination: Coordination normal.     Gait: Gait normal.     Deep Tendon Reflexes: Reflexes normal.  Psychiatric:        Attention and Perception: Attention and perception normal.        Mood and Affect: Mood is not elated. Affect is not labile, blunt, flat, angry or tearful.        Speech: Speech normal.        Behavior: Behavior is not agitated, slowed, aggressive, withdrawn, hyperactive or combative.        Assessment & Plan:  Pulmonary embolism, unspecified chronicity, unspecified pulmonary embolism type, unspecified whether acute cor pulmonale present (HCC) - Plan: COMPLETE METABOLIC PANEL WITH GFR, CBC with Differential/Platelet  Primary insomnia - Plan: ALPRAZolam (XANAX) 0.5 MG tablet  DDD (degenerative disc disease), lumbar - Concern for worsening stenosis in setting of DDD lumbar spine, has had plain films, conservative managment by orthopedics, Prednisone, ultram, topicals - Plan: traMADol (ULTRAM) 50 MG tablet  Stage 3b chronic kidney disease (HCC)  Benign essential HTN Patient has been taking metoprolol and amlodipine for her blood pressure.  Her blood pressure today is well-controlled.  She denies any dizziness.  She denies any lightheadedness.  Her heart rate today is well above 60.  Therefore I will continue the patient on amlodipine and metoprolol for her hypertension.  I recommended that she complete the  current month supply of Eliquis.  Due to her age and her renal function I have recommended that we scaled down to 2-1/2 mg twice daily next month.  We discussed stopping the medication after 3 months versus continuing it indefinitely.  Given that the situation that triggered the blood clot is irreversible (her immobility), we have decided to continue the Eliquis indefinitely for the present time.  She is taking Xanax for sleep.  We discussed the risk of falls with this.  I want her to use tramadol and Xanax very judiciously.  The patient has severe pain in her back and her hips and her knees.  We are using prednisone due to her chronic kidney disease because we have no other available option.  She has delirium with higher dose narcotics.  Therefore we will  continue prednisone with the tramadol for her chronic back pain and knee pain.  She can use Xanax at night to help her sleep.

## 2022-11-30 LAB — COMPLETE METABOLIC PANEL WITH GFR
AG Ratio: 1.7 (calc) (ref 1.0–2.5)
ALT: 18 U/L (ref 6–29)
AST: 20 U/L (ref 10–35)
Albumin: 3.7 g/dL (ref 3.6–5.1)
Alkaline phosphatase (APISO): 49 U/L (ref 37–153)
BUN/Creatinine Ratio: 19 (calc) (ref 6–22)
BUN: 36 mg/dL — ABNORMAL HIGH (ref 7–25)
CO2: 23 mmol/L (ref 20–32)
Calcium: 9.3 mg/dL (ref 8.6–10.4)
Chloride: 105 mmol/L (ref 98–110)
Creat: 1.87 mg/dL — ABNORMAL HIGH (ref 0.60–0.95)
Globulin: 2.2 g/dL (ref 1.9–3.7)
Glucose, Bld: 207 mg/dL — ABNORMAL HIGH (ref 65–99)
Potassium: 4.8 mmol/L (ref 3.5–5.3)
Sodium: 137 mmol/L (ref 135–146)
Total Bilirubin: 0.4 mg/dL (ref 0.2–1.2)
Total Protein: 5.9 g/dL — ABNORMAL LOW (ref 6.1–8.1)
eGFR: 26 mL/min/{1.73_m2} — ABNORMAL LOW (ref >=60)

## 2022-11-30 LAB — CBC WITH DIFFERENTIAL/PLATELET
Absolute Lymphocytes: 468 {cells}/uL — ABNORMAL LOW (ref 850–3900)
Absolute Monocytes: 182 {cells}/uL — ABNORMAL LOW (ref 200–950)
Basophils Absolute: 0 {cells}/uL (ref 0–200)
Basophils Relative: 0 %
Eosinophils Absolute: 0 {cells}/uL — ABNORMAL LOW (ref 15–500)
Eosinophils Relative: 0 %
HCT: 36.9 % (ref 35.0–45.0)
Hemoglobin: 11.9 g/dL (ref 11.7–15.5)
MCH: 30.7 pg (ref 27.0–33.0)
MCHC: 32.2 g/dL (ref 32.0–36.0)
MCV: 95.1 fL (ref 80.0–100.0)
MPV: 10.2 fL (ref 7.5–12.5)
Monocytes Relative: 2.8 %
Neutro Abs: 5850 {cells}/uL (ref 1500–7800)
Neutrophils Relative %: 90 %
Platelets: 211 10*3/uL (ref 140–400)
RBC: 3.88 10*6/uL (ref 3.80–5.10)
RDW: 12.9 % (ref 11.0–15.0)
Total Lymphocyte: 7.2 %
WBC: 6.5 10*3/uL (ref 3.8–10.8)

## 2022-12-02 ENCOUNTER — Telehealth: Payer: Self-pay

## 2022-12-02 ENCOUNTER — Other Ambulatory Visit: Payer: Self-pay | Admitting: Family Medicine

## 2022-12-02 NOTE — Transitions of Care (Post Inpatient/ED Visit) (Signed)
12/02/2022  Name: [name] MRN: [id number] DOB: September 19, 1931  Today's TOC FU Call Status: Today's TOC FU Call Status:: Successful TOC FU Call Completed TOC FU Call Complete Date: 12/02/22 Patient's Name and Date of Birth confirmed.  Transition Care Management Follow-up Telephone Call Discharge Facility: [facility]) Type of Discharge: Inpatient Admission Primary Inpatient Discharge Diagnosis:: PE and DVT How have you been since you were released from the hospital?: Better (Reports no problems.) Any questions or concerns?: No  Items Reviewed: Did you receive and understand the discharge instructions provided?: Yes Medications obtained,verified, and reconciled?: Yes (Medications Reviewed) Any new allergies since your discharge?: No Dietary orders reviewed?: No Do you have support at home?: Yes People in Home: child(ren), adult Name of Support/Comfort Primary Source: Son lives across the street  Medications Reviewed Today: Medications Reviewed Today     Reviewed by Earlie Server, RN (Registered Nurse) on 12/02/22 at 0915  Med List Status: <None>   Medication Order Taking? Sig Documenting Provider Last Dose Status Informant  ALPRAZolam (XANAX) 0.5 MG tablet [id number] Yes Take 1 tablet (0.5 mg total) by mouth 3 (three) times daily as needed. for anxiety Donita Brooks, MD Taking Active   amLODipine (NORVASC) 5 MG tablet [id number] Yes TAKE ONE TABLET BY MOUTH ONCE A DAY Donita Brooks, MD Taking Active Self, Pharmacy Records  apixaban (ELIQUIS) 2.5 MG TABS tablet [id number] No Take 1 tablet (2.5 mg total) by mouth 2 (two) times daily.  Patient not taking: Reported on 12/02/2022   Donita Brooks, MD Not Taking Active   apixaban (ELIQUIS) 5 MG TABS tablet [id number] Yes Take 2 tablets (10 mg total) by mouth 2 (two) times daily for 6 days, THEN 1 tablet (5 mg total) 2 (two) times daily for 25 days. Pieter Partridge, MD Taking Active   denosumab (PROLIA) 60  MG/ML SOSY injection [id number] Yes Inject 60 mg into the skin every 6 (six) months. [provider] Taking Active Self, Pharmacy Records  diclofenac Sodium (VOLTAREN) 1 % GEL [id number] Yes Apply topically 4 (four) times daily. [provider] Taking Active Self, Pharmacy Records  escitalopram (LEXAPRO) 10 MG tablet [id number] Yes Take 1 tablet (10 mg total) by mouth daily. Donita Brooks, MD Taking Active Self, Pharmacy Records  metoprolol tartrate (LOPRESSOR) 25 MG tablet [id number] Yes Take 0.5 tablets (12.5 mg total) by mouth 2 (two) times daily. Pieter Partridge, MD Taking Active   predniSONE (DELTASONE) 10 MG tablet [id number] Yes TAKE ONE TABLET BY MOUTH ONCE A DAY WITH BREAKFAST Donita Brooks, MD Taking Active   traMADol (ULTRAM) 50 MG tablet [id number] Yes Take 1 tablet (50 mg total) by mouth every 6 (six) hours as needed. Donita Brooks, MD Taking Active             Home Care and Equipment/Supplies: Were Home Health Services Ordered?: No Any new equipment or medical supplies ordered?: No (Patient wants a new rolling walker.)  Functional Questionnaire: Do you need assistance with bathing/showering or dressing?: No Do you need assistance with meal preparation?: No (Reports she got MOMs meals) Do you need assistance with eating?: No Do you have difficulty maintaining continence: No Do you need assistance with getting out of bed/getting out of a chair/moving?: Yes (uses a walker.) Do you have difficulty managing or taking your medications?: No  Follow up appointments reviewed: PCP Follow-up appointment confirmed?: Yes Date of PCP follow-up appointment?: 11/29/22 Follow-up Provider: [facility] Follow-up appointment confirmed?: No  Reason Specialist Follow-Up Not Confirmed: Patient has Specialist Provider Number and will Call for Appointment Do you need transportation to your follow-up appointment?: No Do you understand care options  if your condition(s) worsen?: Yes-patient verbalized understanding  SDOH Interventions Today    Flowsheet Row Most Recent Value  SDOH Interventions   Food Insecurity Interventions Intervention Not Indicated  Transportation Interventions Intervention Not Indicated      Reviewed 30 day transition of care call with patient and she declines. Patient reports that her breathing is the best that it has been for a long time.  States that she needs another small rollator and would like the RX to be sent to [facility].  Message sent to MD to send RX to [facility].  I provided my contact information to patient is she needs assistance in the future.    Lonia Chimera, RN, BSN, CEN [employer]- Transition of Care Team.  [facility] [phone]

## 2022-12-02 NOTE — Telephone Encounter (Signed)
Donita Brooks, MD  Darral Dash, LPN They are not on epic.  Can we fax an order for this to them.       Previous Messages    ----- Message ----- From: Colleen Server, RN Sent: 12/02/2022   9:27 AM EDT To: Donita Brooks, MD  Good morning, I was able to complete TOC call with patient this am.  She needs a RX for a small rollator. Reports that her brakes are not working with her small rollator that she has at home and that it is 87 years old. She would like you to send in RX to [facility] in [place].  Thank you, Lonia Chimera, RN, BSN, CEN Population Health- Transition of Care Team. [facility] [phone]

## 2022-12-16 ENCOUNTER — Other Ambulatory Visit: Payer: Self-pay

## 2022-12-16 ENCOUNTER — Encounter: Payer: Self-pay | Admitting: Emergency Medicine

## 2022-12-16 ENCOUNTER — Emergency Department: Payer: 59

## 2022-12-16 ENCOUNTER — Emergency Department
Admission: EM | Admit: 2022-12-16 | Discharge: 2022-12-16 | Disposition: A | Discharge status: Home / Self Care | Payer: 59 | Attending: Emergency Medicine | Admitting: Emergency Medicine

## 2022-12-16 ENCOUNTER — Telehealth: Payer: Self-pay

## 2022-12-16 DIAGNOSIS — M7989 Other specified soft tissue disorders: Secondary | ICD-10-CM | POA: Diagnosis not present

## 2022-12-16 DIAGNOSIS — D649 Anemia, unspecified: Secondary | ICD-10-CM | POA: Diagnosis not present

## 2022-12-16 DIAGNOSIS — S8011XA Contusion of right lower leg, initial encounter: Secondary | ICD-10-CM | POA: Diagnosis not present

## 2022-12-16 DIAGNOSIS — R58 Hemorrhage, not elsewhere classified: Secondary | ICD-10-CM | POA: Insufficient documentation

## 2022-12-16 DIAGNOSIS — Z86718 Personal history of other venous thrombosis and embolism: Secondary | ICD-10-CM | POA: Diagnosis not present

## 2022-12-16 DIAGNOSIS — M7121 Synovial cyst of popliteal space [Baker], right knee: Secondary | ICD-10-CM | POA: Diagnosis not present

## 2022-12-16 LAB — COMPREHENSIVE METABOLIC PANEL
ALT: 26 U/L (ref 0–44)
AST: 30 U/L (ref 15–41)
Albumin: 3.8 g/dL (ref 3.5–5.0)
Alkaline Phosphatase: 44 U/L (ref 38–126)
Anion gap: 8 (ref 5–15)
BUN: 40 mg/dL — ABNORMAL HIGH (ref 8–23)
CO2: 24 mmol/L (ref 22–32)
Calcium: 8.6 mg/dL — ABNORMAL LOW (ref 8.9–10.3)
Chloride: 105 mmol/L (ref 98–111)
Creatinine, Ser: 1.74 mg/dL — ABNORMAL HIGH (ref 0.44–1.00)
GFR, Estimated: 28 mL/min — ABNORMAL LOW (ref >60)
Glucose, Bld: 86 mg/dL (ref 70–99)
Potassium: 3.8 mmol/L (ref 3.5–5.1)
Sodium: 137 mmol/L (ref 135–145)
Total Bilirubin: 0.8 mg/dL (ref <1.2)
Total Protein: 6.5 g/dL (ref 6.5–8.1)

## 2022-12-16 LAB — CBC WITH DIFFERENTIAL/PLATELET
Abs Immature Granulocytes: 0.25 10*3/uL — ABNORMAL HIGH (ref 0.00–0.07)
Basophils Absolute: 0 10*3/uL (ref 0.0–0.1)
Basophils Relative: 0 %
Eosinophils Absolute: 0 10*3/uL (ref 0.0–0.5)
Eosinophils Relative: 0 %
HCT: 32.5 % — ABNORMAL LOW (ref 36.0–46.0)
Hemoglobin: 10.5 g/dL — ABNORMAL LOW (ref 12.0–15.0)
Immature Granulocytes: 2 %
Lymphocytes Relative: 25 %
Lymphs Abs: 3 10*3/uL (ref 0.7–4.0)
MCH: 31.4 pg (ref 26.0–34.0)
MCHC: 32.3 g/dL (ref 30.0–36.0)
MCV: 97.3 fL (ref 80.0–100.0)
Monocytes Absolute: 1.1 10*3/uL — ABNORMAL HIGH (ref 0.1–1.0)
Monocytes Relative: 10 %
Neutro Abs: 7.3 10*3/uL (ref 1.7–7.7)
Neutrophils Relative %: 63 %
Platelets: 219 10*3/uL (ref 150–400)
RBC: 3.34 MIL/uL — ABNORMAL LOW (ref 3.87–5.11)
RDW: 13.6 % (ref 11.5–15.5)
WBC: 11.8 10*3/uL — ABNORMAL HIGH (ref 4.0–10.5)
nRBC: 0 % (ref 0.0–0.2)

## 2022-12-16 NOTE — ED Provider Notes (Signed)
[facility] Provider Note    Event Date/Time   First MD Initiated Contact with Patient 12/16/22 [phone]     (approximate)   History   Leg Swelling   HPI  [name] is a 89 y.o. female who presents to the emergency department today because of concerns for right leg swelling and or bruising.  The patient noticed this 2 days ago.  She denies any preceding trauma or unusual activity.  Of note a couple of weeks ago the patient was in the hospital for DVT and PE and was started on blood thinners.  Patient denies any pain in her leg.  Denies any new shortness of breath or chest pain.  Denies any fatigue.     Physical Exam   Triage Vital Signs: ED Triage Vitals [12/16/22 0847]  Encounter Vitals Group     BP 138/74     Systolic BP Percentile      Diastolic BP Percentile      Pulse Rate 73     Resp 16     Temp 98.3 F (36.8 C)     Temp Source Oral     SpO2 97 %     Weight 135 lb (61.2 kg)     Height 5\' 3"  (1.6 m)     Head Circumference      Peak Flow      Pain Score 0     Pain Loc      Pain Education      Exclude from Growth Chart     Most recent vital signs: Vitals:   12/16/22 0847  BP: 138/74  Pulse: 73  Resp: 16  Temp: 98.3 F (36.8 C)  SpO2: 97%    General: Awake, alert, oriented. CV:  Good peripheral perfusion.  Resp:  Normal effort.  Abd:  No distention.  Other:  Right leg with some edema, large amount of ecchymosis. DP 2+.   ED Results / Procedures / Treatments   Labs (all labs ordered are listed, but only abnormal results are displayed) Labs Reviewed  CBC WITH DIFFERENTIAL/PLATELET - Abnormal; Notable for the following components:      Result Value   WBC 11.8 (*)    RBC 3.34 (*)    Hemoglobin 10.5 (*)    HCT 32.5 (*)    Monocytes Absolute 1.1 (*)    Abs Immature Granulocytes 0.25 (*)    All other components within normal limits  COMPREHENSIVE METABOLIC PANEL     EKG  None   RADIOLOGY I independently  interpreted and visualized the RLE [place]. My interpretation: No clot Radiology interpretation:  IMPRESSION:  1. No evidence of femoropopliteal DVT or superficial  thrombophlebitis within the RIGHT lower extremity.  2. Small, 1 cm RIGHT popliteal fossa/Baker cyst.      PROCEDURES:  Critical Care performed: No  MEDICATIONS ORDERED IN ED: Medications - No data to display   IMPRESSION / MDM / ASSESSMENT AND PLAN / ED COURSE  I reviewed the triage vital signs and the nursing notes.                              Differential diagnosis includes, but is not limited to, ecchymosis, DVT  Patient's presentation is most consistent with acute presentation with potential threat to life or bodily function.   Patient presented to the emergency department today because of concerns for right leg swelling and bruising.  On exam patient  does have palpable dorsalis pedal pulse.  Did obtain ultrasound which not show any new DVT.  At this time I think patient likely suffered an unknown trauma.  She denies any shortness of breath or weakness that would suggest a significant blood loss.  Blood work does show slight anemia.  I do think is reasonable for patient to be discharged at this time.  Discussed blood loss return precautions.     FINAL CLINICAL IMPRESSION(S) / ED DIAGNOSES   Final diagnoses:  Ecchymosis       Note:  This document was prepared using Dragon voice recognition software and may include unintentional dictation errors.    Phineas Semen, MD 12/16/22 [phone]

## 2022-12-16 NOTE — ED Triage Notes (Signed)
Was admitted end oct for PE/DVT.  Has been taking blood thinners.  Did not take it last night.  Saturday night noticed RLE was swollen and discolored.  Bruising noted around entire calf and up thigh as high as RN can see. Pt reports goes to groin.  Palpable DP pulse.  No SHOB

## 2022-12-16 NOTE — Telephone Encounter (Signed)
Copied from CRM [id number]. Topic: Clinical - Medication Question >> Dec 16, 2022  8:48 AM Herbert Seta B wrote: Reason for CRM: Marcelino Duster from [employer] for 2nd time to get list of current medications patient is taking.  Call [phone] Fax [phone]

## 2022-12-17 ENCOUNTER — Telehealth: Payer: Self-pay

## 2022-12-17 NOTE — Transitions of Care (Post Inpatient/ED Visit) (Signed)
12/17/2022  Name: [name] MRN: [id number] DOB: 05-19-1931  Today's TOC FU Call Status: Today's TOC FU Call Status:: Successful TOC FU Call Completed TOC FU Call Complete Date: 12/17/22 Patient's Name and Date of Birth confirmed.  Transition Care Management Follow-up Telephone Call Date of Discharge: 12/16/22 Discharge Facility: [facility]) Type of Discharge: Emergency Department Primary Inpatient Discharge Diagnosis:: hemorrhage Reason for ED Visit: Other: (hemorrhage) How have you been since you were released from the hospital?: Same Any questions or concerns?: No  Items Reviewed: Did you receive and understand the discharge instructions provided?: Yes Medications obtained,verified, and reconciled?: Yes (Medications Reviewed) Any new allergies since your discharge?: No Dietary orders reviewed?: Yes Do you have support at home?: No  Medications Reviewed Today: Medications Reviewed Today     Reviewed by Karena Addison, LPN (Licensed Practical Nurse) on 12/17/22 at [phone]  Med List Status: <None>   Medication Order Taking? Sig Documenting Provider Last Dose Status Informant  ALPRAZolam (XANAX) 0.5 MG tablet [id number] No Take 1 tablet (0.5 mg total) by mouth 3 (three) times daily as needed. for anxiety Donita Brooks, MD Taking Active   amLODipine (NORVASC) 5 MG tablet [id number] No TAKE ONE TABLET BY MOUTH ONCE A DAY Donita Brooks, MD Taking Active Self, Pharmacy Records  apixaban (ELIQUIS) 2.5 MG TABS tablet [id number] No Take 1 tablet (2.5 mg total) by mouth 2 (two) times daily.  Patient not taking: Reported on 12/02/2022   Donita Brooks, MD Not Taking Active   apixaban (ELIQUIS) 5 MG TABS tablet [id number] No Take 2 tablets (10 mg total) by mouth 2 (two) times daily for 6 days, THEN 1 tablet (5 mg total) 2 (two) times daily for 25 days. Pieter Partridge, MD Taking Active   denosumab (PROLIA) 60 MG/ML SOSY injection  [id number] No Inject 60 mg into the skin every 6 (six) months. [provider] Taking Active Self, Pharmacy Records  diclofenac Sodium (VOLTAREN) 1 % GEL [id number] No Apply topically 4 (four) times daily. [provider] Taking Active Self, Pharmacy Records  escitalopram (LEXAPRO) 10 MG tablet [id number] No Take 1 tablet (10 mg total) by mouth daily. Donita Brooks, MD Taking Active Self, Pharmacy Records  metoprolol tartrate (LOPRESSOR) 25 MG tablet [id number] No Take 0.5 tablets (12.5 mg total) by mouth 2 (two) times daily. Pieter Partridge, MD Taking Active   predniSONE (DELTASONE) 10 MG tablet [id number] No TAKE ONE TABLET BY MOUTH ONCE A DAY WITH BREAKFAST Donita Brooks, MD Taking Active   traMADol (ULTRAM) 50 MG tablet [id number] No Take 1 tablet (50 mg total) by mouth every 6 (six) hours as needed. Donita Brooks, MD Taking Active             Home Care and Equipment/Supplies: Were Home Health Services Ordered?: NA Any new equipment or medical supplies ordered?: NA  Functional Questionnaire: Do you need assistance with bathing/showering or dressing?: No Do you need assistance with meal preparation?: No Do you need assistance with eating?: No Do you have difficulty maintaining continence: No Do you need assistance with getting out of bed/getting out of a chair/moving?: No Do you have difficulty managing or taking your medications?: No  Follow up appointments reviewed: PCP Follow-up appointment confirmed?: Yes Date of PCP follow-up appointment?: 12/24/22 Follow-up Provider: [facility] Follow-up appointment confirmed?: NA Do you need transportation to your follow-up appointment?: No Do you understand care options if your condition(s) worsen?: Yes-patient verbalized understanding  SIGNATURE Karena Addison, LPN [facility] Nurse Health Advisor Direct Dial [phone]

## 2022-12-23 ENCOUNTER — Other Ambulatory Visit: Payer: Self-pay | Admitting: Family Medicine

## 2022-12-24 ENCOUNTER — Ambulatory Visit (INDEPENDENT_AMBULATORY_CARE_PROVIDER_SITE_OTHER): Payer: 59 | Admitting: Family Medicine

## 2022-12-24 ENCOUNTER — Encounter: Payer: Self-pay | Admitting: Family Medicine

## 2022-12-24 VITALS — BP 122/60 | HR 56 | Temp 98.4°F | Ht 63.0 in | Wt 133.6 lb

## 2022-12-24 DIAGNOSIS — D649 Anemia, unspecified: Secondary | ICD-10-CM

## 2022-12-24 DIAGNOSIS — I2699 Other pulmonary embolism without acute cor pulmonale: Secondary | ICD-10-CM | POA: Diagnosis not present

## 2022-12-24 DIAGNOSIS — N1832 Chronic kidney disease, stage 3b: Secondary | ICD-10-CM | POA: Diagnosis not present

## 2022-12-24 MED ORDER — AMLODIPINE BESYLATE 5 MG PO TABS: 5.0000 mg | ORAL_TABLET | Freq: Every day | ORAL | 1 refills | Status: DC

## 2022-12-24 MED ORDER — PREDNISONE 10 MG PO TABS: ORAL_TABLET | ORAL | 6 refills | Status: DC

## 2022-12-24 MED ORDER — ESCITALOPRAM OXALATE 10 MG PO TABS: 10.0000 mg | ORAL_TABLET | Freq: Every day | ORAL | 3 refills | Status: DC

## 2022-12-24 MED ORDER — PREDNISONE 20 MG PO TABS: ORAL_TABLET | ORAL | 0 refills | Status: DC

## 2022-12-24 NOTE — Telephone Encounter (Signed)
Refused amlodipine and Lexapro because they were signed and sent earlier today.   This is a duplicate request.

## 2022-12-24 NOTE — Progress Notes (Signed)
Subjective:    Patient ID: [name], female    DOB: January 10, 1932, 89 y.o.   MRN: [id number]  Patient is currently taking Eliquis 0.5 20 due to her recent diagnosis of a pulmonary embolism.  On November 11, the hospital due to profuse bruising in her right leg.  She states that she woke up that morning and her entire right leg was black from her thigh down to her knee all the way into her calf.  It was not actually black to dark purple.  This signifies a hematoma.  In the hospital, her white blood cell count was 11.8.  Her hemoglobin had dropped from 11.9-10.5.  They repeated an ultrasound of her right leg which showed no evidence of the DVT.  She was diagnosed with a hematoma and discharged home.  She states that since she has been home, the bruising has improved in the right leg.  She still has not scheduled ecchymosis on her anterior right thigh just above the elbow down to her lateral right calf.  There are 2 large vesicles.  1 is on her posterior right calf.  One is on her lateral right shin.  They are roughly 3 cm in length and 2 cm in width.  They are full of bloody serous fluid. Past Medical History:  Diagnosis Date   Cataract    CKD (chronic kidney disease) stage 3, GFR 30-59 ml/min (HCC)    Fatty liver disease, nonalcoholic    2007- admitted with encephalopathy unsure of cause, resolved sponatneously   Hypertension    Pulmonary embolism (HCC)    Past Surgical History:  Procedure Laterality Date   FRACTURE SURGERY     right shoulder, both wrists    JOINT REPLACEMENT     HIP- bilateral   Current Outpatient Medications on File Prior to Visit  Medication Sig Dispense Refill   ALPRAZolam (XANAX) 0.5 MG tablet Take 1 tablet (0.5 mg total) by mouth 3 (three) times daily as needed. for anxiety 30 tablet 2   amLODipine (NORVASC) 5 MG tablet TAKE ONE TABLET BY MOUTH ONCE A DAY 90 tablet 1   apixaban (ELIQUIS) 2.5 MG TABS tablet Take 1 tablet (2.5 mg total) by mouth 2 (two) times  daily. (Patient not taking: Reported on 12/02/2022) 60 tablet 11   apixaban (ELIQUIS) 5 MG TABS tablet Take 2 tablets (10 mg total) by mouth 2 (two) times daily for 6 days, THEN 1 tablet (5 mg total) 2 (two) times daily for 25 days. 74 tablet 0   denosumab (PROLIA) 60 MG/ML SOSY injection Inject 60 mg into the skin every 6 (six) months.     diclofenac Sodium (VOLTAREN) 1 % GEL Apply topically 4 (four) times daily.     escitalopram (LEXAPRO) 10 MG tablet Take 1 tablet (10 mg total) by mouth daily. 90 tablet 3   metoprolol tartrate (LOPRESSOR) 25 MG tablet Take 0.5 tablets (12.5 mg total) by mouth 2 (two) times daily. 30 tablet 0   predniSONE (DELTASONE) 10 MG tablet TAKE ONE TABLET BY MOUTH ONCE A DAY WITH BREAKFAST 30 tablet 6   traMADol (ULTRAM) 50 MG tablet Take 1 tablet (50 mg total) by mouth every 6 (six) hours as needed. 120 tablet 2   No current facility-administered medications on file prior to visit.       Allergies  Allergen Reactions   Pneumococcal Vaccines Other (See Comments)    Pt had localized reaction to injection of Pneumococcal 23 - Swelling and redness in  upper arm that extend to but not beyond elbow   Social History   Socioeconomic History   Marital status: Widowed    Spouse name: Not on file   Number of children: 1   Years of education: Not on file   Highest education level: Not on file  Occupational History   Not on file  Tobacco Use   Smoking status: Never   Smokeless tobacco: Never  Vaping Use   Vaping status: Never Used  Substance and Sexual Activity   Alcohol use: Not Currently   Drug use: Never   Sexual activity: Not Currently    Comment: raised tobacco, retired  Other Topics Concern   Not on file  Social History Narrative   Lives alone   Right handed    Caffeine: 1/2-1 cup coffee in AM   Social Determinants of Health   Financial Resource Strain: Low Risk  (04/09/2022)   Overall Financial Resource Strain (CARDIA)    Difficulty of Paying  Living Expenses: Not hard at all  Food Insecurity: No Food Insecurity (12/02/2022)   Hunger Vital Sign    Worried About Running Out of Food in the Last Year: Never true    Ran Out of Food in the Last Year: Never true  Transportation Needs: No Transportation Needs (12/02/2022)   PRAPARE - Administrator, Civil Service (Medical): No    Lack of Transportation (Non-Medical): No  Physical Activity: Inactive (04/09/2022)   Exercise Vital Sign    Days of Exercise per Week: 0 days    Minutes of Exercise per Session: 0 min  Stress: No Stress Concern Present (04/09/2022)   [employer] of Occupational Health - Occupational Stress Questionnaire    Feeling of Stress : Not at all  Social Connections: Moderately Isolated (04/09/2022)   Social Connection and Isolation Panel [NHANES]    Frequency of Communication with Friends and Family: More than three times a week    Frequency of Social Gatherings with Friends and Family: More than three times a week    Attends Religious Services: 1 to 4 times per year    Active Member of [employer] or Organizations: No    Attends Banker Meetings: Never    Marital Status: Widowed  Intimate Partner Violence: Not At Risk (11/21/2022)   Humiliation, Afraid, Rape, and Kick questionnaire    Fear of Current or Ex-Partner: No    Emotionally Abused: No    Physically Abused: No    Sexually Abused: No     Review of Systems  All other systems reviewed and are negative.      Objective:   Physical Exam Vitals reviewed.  Constitutional:      General: She is not in acute distress.    Appearance: She is well-developed and normal weight. She is not ill-appearing, toxic-appearing or diaphoretic.  HENT:     Mouth/Throat:     Mouth: Mucous membranes are dry.     Pharynx: No oropharyngeal exudate or posterior oropharyngeal erythema.  Eyes:     Extraocular Movements: Extraocular movements intact.     Conjunctiva/sclera: Conjunctivae normal.   Cardiovascular:     Rate and Rhythm: Regular rhythm. Tachycardia present.     Heart sounds: Normal heart sounds. No murmur heard.    No friction rub. No gallop.  Pulmonary:     Effort: Pulmonary effort is normal. No respiratory distress.     Breath sounds: Normal breath sounds. No stridor. No wheezing or rales.  Abdominal:  General: Bowel sounds are normal. There is no distension.     Palpations: Abdomen is soft.     Tenderness: There is no abdominal tenderness. There is no guarding or rebound.  Musculoskeletal:        General: Swelling present.     Lumbar back: Tenderness present. Decreased range of motion.     Right hip: Decreased range of motion.     Left hip: Decreased range of motion.     Right knee: Decreased range of motion. No MCL or LCL tenderness.     Left knee: Decreased range of motion.     Right lower leg: Edema present.     Left lower leg: No edema.  Lymphadenopathy:     Cervical: No cervical adenopathy.  Skin:    Findings: Bruising, ecchymosis and lesion present. No erythema.       Neurological:     General: No focal deficit present.     Mental Status: She is alert and oriented to person, place, and time.     Cranial Nerves: No cranial nerve deficit.     Motor: No weakness.     Coordination: Coordination normal.     Gait: Gait normal.     Deep Tendon Reflexes: Reflexes normal.  Psychiatric:        Attention and Perception: Attention and perception normal.        Mood and Affect: Mood is not elated. Affect is not labile, blunt, flat, angry or tearful.        Speech: Speech normal.        Behavior: Behavior is not agitated, slowed, aggressive, withdrawn, hyperactive or combative.     Purple line on the diagram represents the extent of the bruising.  Red circle signifies the location of the vesicles describes.  The present illness.  These are blood blisters    Assessment & Plan:  Stage 3b chronic kidney disease (HCC)  Pulmonary embolism, unspecified  chronicity, unspecified pulmonary embolism type, unspecified whether acute cor pulmonale present (HCC)  Anemia, unspecified type - Plan: Vitamin B12, CBC with Differential/Platelet, Iron, BASIC METABOLIC PANEL WITH GFR Patient has a large hematoma on the right leg.  This was likely due to an unknown trauma.  Will repeat CBC to ensure that the hemoglobin is not dropping further.  It has gradually trended down ever since she has been on Eliquis.  I believe the most recent drop is due to the hematoma.  I want to ensure that this is stable.  I will also check an iron and a B12 level to evaluate for any vitamin deficiencies that may be contributing to her anemia.  If the hemoglobin is even lower, I will check her stool for blood.  The hematoma on the right leg will resolve gradually on its own.  I did wrap the leg from the ankle to the thigh with an Ace bandage.  I recommended that she do this to help decrease the swelling and the bruising and prevent further vesicle formation until healed.

## 2022-12-25 LAB — BASIC METABOLIC PANEL WITHOUT GFR
BUN/Creatinine Ratio: 18 (calc) (ref 6–22)
BUN: 31 mg/dL — ABNORMAL HIGH (ref 7–25)
CO2: 27 mmol/L (ref 20–32)
Calcium: 9.7 mg/dL (ref 8.6–10.4)
Chloride: 103 mmol/L (ref 98–110)
Creat: 1.69 mg/dL — ABNORMAL HIGH (ref 0.60–0.95)
Glucose, Bld: 111 mg/dL — ABNORMAL HIGH (ref 65–99)
Potassium: 4.9 mmol/L (ref 3.5–5.3)
Sodium: 139 mmol/L (ref 135–146)
eGFR: 29 mL/min/{1.73_m2} — ABNORMAL LOW

## 2022-12-25 LAB — CBC WITH DIFFERENTIAL/PLATELET
Absolute Lymphocytes: 846 {cells}/uL — ABNORMAL LOW (ref 850–3900)
Absolute Monocytes: 359 {cells}/uL (ref 200–950)
Basophils Absolute: 18 {cells}/uL (ref 0–200)
Basophils Relative: 0.2 %
Eosinophils Absolute: 18 {cells}/uL (ref 15–500)
Eosinophils Relative: 0.2 %
HCT: 34.4 % — ABNORMAL LOW (ref 35.0–45.0)
Hemoglobin: 11.1 g/dL — ABNORMAL LOW (ref 11.7–15.5)
MCH: 30.7 pg (ref 27.0–33.0)
MCHC: 32.3 g/dL (ref 32.0–36.0)
MCV: 95.3 fL (ref 80.0–100.0)
MPV: 10.3 fL (ref 7.5–12.5)
Monocytes Relative: 3.9 %
Neutro Abs: 7958 {cells}/uL — ABNORMAL HIGH (ref 1500–7800)
Neutrophils Relative %: 86.5 %
Platelets: 221 10*3/uL (ref 140–400)
RBC: 3.61 Million/uL — ABNORMAL LOW (ref 3.80–5.10)
RDW: 12.8 % (ref 11.0–15.0)
Total Lymphocyte: 9.2 %
WBC: 9.2 10*3/uL (ref 3.8–10.8)

## 2022-12-25 LAB — IRON: Iron: 96 ug/dL (ref 45–160)

## 2022-12-25 LAB — VITAMIN B12: Vitamin B-12: 222 pg/mL (ref 200–1100)

## 2023-01-23 ENCOUNTER — Other Ambulatory Visit: Payer: Self-pay | Admitting: Family Medicine

## 2023-01-23 MED ORDER — METOPROLOL TARTRATE 25 MG PO TABS: 12.5000 mg | ORAL_TABLET | Freq: Two times a day (BID) | ORAL | 3 refills | Status: DC

## 2023-01-23 NOTE — Telephone Encounter (Signed)
Copied from CRM [id number]. Topic: Clinical - Medication Refill >> Jan 23, 2023 11:09 AM Desma Mcgregor wrote: Most Recent Primary Care Visit:  Provider: [name] T  Department: [facility]  Visit Type: HOSPITAL FU  Date: 12/24/2022  Medication: metoprolol tartrate (LOPRESSOR) 25 MG tablet   Has the patient contacted their pharmacy? Yes (Agent: If no, request that the patient contact the pharmacy for the refill. If patient does not wish to contact the pharmacy document the reason why and proceed with request.) (Agent: If yes, when and what did the pharmacy advise?) Pharmacy sent request, but havent heard back  Is this the correct pharmacy for this prescription? Yes If no, delete pharmacy and type the correct one.  This is the patient's preferred pharmacy:  [facility] [address] [place] - [address] [place] [address] Phone: [phone] Fax: [phone]   Has the prescription been filled recently? Yes  Is the patient out of the medication? Yes  Has the patient been seen for an appointment in the last year OR does the patient have an upcoming appointment? Yes  Can we respond through MyChart? No  Agent: Please be advised that Rx refills may take up to 3 business days. We ask that you follow-up with your pharmacy.

## 2023-02-25 ENCOUNTER — Telehealth: Payer: Self-pay

## 2023-02-25 NOTE — Telephone Encounter (Signed)
Copied from CRM [id number]. Topic: Clinical - Medication Question >> Feb 25, 2023  2:15 PM Carloyn Manner C wrote: Reason for CRM:   02/25/23  Print med list and fax to  Marcelino Duster - Nurse Case Manager with [facility] [phone] extension [phone] is requesting an updated list of medications to be faxed [phone]

## 2023-02-25 NOTE — Telephone Encounter (Signed)
Copied from CRM [id number]. Topic: Clinical - Medication Question >> Feb 25, 2023  2:15 PM Carloyn Manner C wrote: Reason for CRM: Marcelino Duster - Nurse Case Manager with [facility] [phone] extension [phone] is requesting an updated list of medications to be faxed [phone]

## 2023-02-26 ENCOUNTER — Telehealth: Payer: Self-pay | Admitting: Family Medicine

## 2023-02-26 NOTE — Telephone Encounter (Signed)
Copied from CRM [id number]. Topic: Clinical - Medication Question >> Feb 26, 2023  1:44 PM Fuller Mandril wrote: Reason for CRM: Patient called requesting provider increase predniSONE (DELTASONE). She is currently taking 10 MG tablet and would like to know if he is able to increase dose before next refill date. Thank You

## 2023-02-27 ENCOUNTER — Telehealth: Payer: Self-pay

## 2023-02-27 NOTE — Telephone Encounter (Signed)
Attempted to fax med list a few times w/fax error. Call Michelle-case mgr to confirm fax number, no answer. LVM for return call to verify fax #

## 2023-02-27 NOTE — Telephone Encounter (Signed)
Pt's son came into office to drop off ppw for pt to be completed by pcp. Pt's son stated ppw is for pt to start assistance with home care. Pt's son asks if ppw can be faxed to number provided on ppw, and then called pt's son when ppw has been completed and faxed. Forms and ppw placed in nurse's folder.  Cb#: [id number] son Carmyn Spoonamore

## 2023-02-28 ENCOUNTER — Ambulatory Visit: Payer: 59 | Admitting: Family Medicine

## 2023-03-03 ENCOUNTER — Encounter: Payer: Self-pay | Admitting: Family Medicine

## 2023-03-11 ENCOUNTER — Other Ambulatory Visit: Payer: Self-pay | Admitting: Family Medicine

## 2023-03-11 DIAGNOSIS — M51369 Other intervertebral disc degeneration, lumbar region without mention of lumbar back pain or lower extremity pain: Secondary | ICD-10-CM

## 2023-03-12 NOTE — Telephone Encounter (Signed)
 Requested medication (s) are due for refill today: yes   Requested medication (s) are on the active medication list: yes   Last refill:  11/29/22 #120 2 refills  Future visit scheduled: no   Notes to clinic:  not delegated per protocol. Do you want to refill Rx?     Requested Prescriptions  Pending Prescriptions Disp Refills   traMADol  (ULTRAM ) 50 MG tablet [Pharmacy Med Name: traMADol  HCl 50 MG Oral Tablet] 120 tablet 0    Sig: TAKE 1 TABLET BY MOUTH EVERY 6 HOURS AS NEEDED     Not Delegated - Analgesics:  Opioid Agonists Failed - 03/12/2023  8:26 AM      Failed - This refill cannot be delegated      Failed - Urine Drug Screen completed in last 360 days      Failed - Valid encounter within last 3 months    Recent Outpatient Visits           1 year ago PMR (polymyalgia rheumatica) (HCC)   [facility] Pickard, Butler DASEN, MD   2 years ago PMR (polymyalgia rheumatica) (HCC)   [facility] Duanne Butler DASEN, MD   2 years ago PMR (polymyalgia rheumatica) (HCC)   [facility] Duanne Butler DASEN, MD   3 years ago Stage 3b chronic kidney disease (HCC)   [facility] Duanne Butler DASEN, MD   3 years ago Bilateral leg weakness   [facility] Pickard, Butler DASEN, MD

## 2023-03-21 ENCOUNTER — Telehealth: Payer: Self-pay | Admitting: Family Medicine

## 2023-03-21 ENCOUNTER — Other Ambulatory Visit: Payer: Self-pay | Admitting: Family Medicine

## 2023-03-21 DIAGNOSIS — F5101 Primary insomnia: Secondary | ICD-10-CM

## 2023-03-21 MED ORDER — ALPRAZOLAM 0.5 MG PO TABS: 0.5000 mg | ORAL_TABLET | Freq: Three times a day (TID) | ORAL | 2 refills | Status: DC | PRN

## 2023-03-21 NOTE — Telephone Encounter (Signed)
Copied from CRM [id number]. Topic: Clinical - Prescription Issue >> Mar 21, 2023 11:33 AM Clayton Bibles wrote: Reason for CRM: Lissett has called 3 times to try and get her traMADol (ULTRAM) 50 MG tablet refilled at [facility] at [employer] in [place]. Pharmacy has never received the refill and I do not see any notes from Doctor Please call Larene Beach (daughter in law) back at [phone] as soon as possible.

## 2023-03-22 ENCOUNTER — Encounter: Payer: Self-pay | Admitting: Family Medicine

## 2023-03-24 ENCOUNTER — Other Ambulatory Visit: Payer: Self-pay | Admitting: Family Medicine

## 2023-03-24 DIAGNOSIS — M51369 Other intervertebral disc degeneration, lumbar region without mention of lumbar back pain or lower extremity pain: Secondary | ICD-10-CM

## 2023-03-24 MED ORDER — TRAMADOL HCL 50 MG PO TABS: 50.0000 mg | ORAL_TABLET | Freq: Four times a day (QID) | ORAL | 2 refills | Status: DC | PRN

## 2023-03-31 ENCOUNTER — Other Ambulatory Visit: Payer: Self-pay | Admitting: Family Medicine

## 2023-04-08 ENCOUNTER — Other Ambulatory Visit: Admitting: Family Medicine

## 2023-04-08 ENCOUNTER — Other Ambulatory Visit: Payer: Self-pay | Admitting: Family Medicine

## 2023-04-08 MED ORDER — PREDNISONE 20 MG PO TABS: ORAL_TABLET | ORAL | 0 refills | Status: DC

## 2023-04-08 MED ORDER — METOPROLOL TARTRATE 25 MG PO TABS: 12.5000 mg | ORAL_TABLET | Freq: Two times a day (BID) | ORAL | 3 refills | Status: DC

## 2023-04-08 NOTE — Telephone Encounter (Signed)
 Copied from CRM [id number]. Topic: Clinical - Medication Refill >> Apr 08, 2023  4:53 PM Fuller Mandril wrote: Most Recent Primary Care Visit:  Provider: [name] T  Department: [facility]  Visit Type: HOSPITAL FU  Date: 12/24/2022  Medication: apixaban (ELIQUIS) 2.5 MG TABS tablet  Has the patient contacted their pharmacy? Yes (Agent: If no, request that the patient contact the pharmacy for the refill. If patient does not wish to contact the pharmacy document the reason why and proceed with request.) (Agent: If yes, when and what did the pharmacy advise?) can't be ordered   Is this the correct pharmacy for this prescription? Yes If no, delete pharmacy and type the correct one.  This is the patient's preferred pharmacy:  [facility] [address], [place] - [address] [place] [address] Phone: [phone] Fax: [phone]   Has the prescription been filled recently? N/A  Is the patient out of the medication? Yes  Has the patient been seen for an appointment in the last year OR does the patient have an upcoming appointment? Yes  Can we respond through MyChart? Yes  Agent: Please be advised that Rx refills may take up to 3 business days. We ask that you follow-up with your pharmacy.

## 2023-04-09 ENCOUNTER — Ambulatory Visit: Payer: Self-pay | Admitting: Family Medicine

## 2023-04-09 NOTE — Telephone Encounter (Signed)
 Daughter in law Vickie called after receiving Prednisone prescription that had been sent in by PCP. Vickie had intended on getting a refill for patient's Prednisone 10mg  Daily but received Prednisone 20mg  taper dose. There may have been some confusion. Daughter in law may need clarification from staff. [facility] called to verify and there are three refills available for prednisone prescription. Family will be unable to pick up Prednisone 10mg  prescription until 04/23/2023 due to having picked up Prednisone 20 mg prescription. [facility] pharmacy states pills can be split in half for patient to maintain 10mg  dosage daily. Vickie was called back after speaking with [facility] and told that the 20 mg Prednisone pills can be split in half. Vickie was also instructed to speak with [facility] about refill process through pharmacy. Family will need follow up call for office staff for order clarification   Copied from CRM [id number]. Topic: Clinical - Prescription Issue >> Apr 09, 2023  4:59 PM Everette C wrote: Reason for CRM: The patient's daughter in law has called for clarity regarding their prescription for predniSONE (DELTASONE) 20 MG tablet [[id number]]  The patient would like to know how many they're supposed to take daily   Please contact further when possible Reason for Disposition  [1] Caller has NON-URGENT medicine question about med that PCP prescribed AND [2] triager unable to answer question  Answer Assessment - Initial Assessment Questions 1. NAME of MEDICINE: "What medicine(s) are you calling about?"     Prednisone 2. QUESTION: "What is your question?" (e.g., double dose of medicine, side effect)     Patient has been taking Prednisone 10mg  daily. Daughter in law ordered medication and Prednisone 20mg  taper dose was ordered. Patient does have 3 refills available at her [facility] 3. PRESCRIBER: "Who prescribed the medicine?" Reason: if prescribed by specialist, call should be referred to that  group.     [facility] 4. SYMPTOMS: "Do you have any symptoms?" If Yes, ask: "What symptoms are you having?"  "How bad are the symptoms (e.g., mild, moderate, severe)     No symptoms  Protocols used: Medication Question Call-A-AH

## 2023-04-09 NOTE — Telephone Encounter (Signed)
 Rx 11/29/22 #60 11RF- should have RF on file Requested Prescriptions  Pending Prescriptions Disp Refills   apixaban (ELIQUIS) 2.5 MG TABS tablet 60 tablet 11    Sig: Take 1 tablet (2.5 mg total) by mouth 2 (two) times daily.     Hematology:  Anticoagulants - apixaban Failed - 04/09/2023  2:06 PM      Failed - HGB in normal range and within 360 days    Hemoglobin  Date Value Ref Range Status  12/24/2022 11.1 (L) 11.7 - 15.5 g/dL Final  57/84/6962 95.2 11.1 - 15.9 g/dL Final         Failed - HCT in normal range and within 360 days    HCT  Date Value Ref Range Status  12/24/2022 34.4 (L) 35.0 - 45.0 % Final   Hematocrit  Date Value Ref Range Status  02/25/2019 43.1 34.0 - 46.6 % Final         Failed - Cr in normal range and within 360 days    Creat  Date Value Ref Range Status  12/24/2022 1.69 (H) 0.60 - 0.95 mg/dL Final         Failed - Valid encounter within last 12 months    Recent Outpatient Visits           1 year ago PMR (polymyalgia rheumatica) (HCC)   [facility] Donita Brooks, MD   2 years ago PMR (polymyalgia rheumatica) (HCC)   [facility] Pickard, Priscille Heidelberg, MD   2 years ago PMR (polymyalgia rheumatica) (HCC)   [facility] Donita Brooks, MD   3 years ago Stage 3b chronic kidney disease (HCC)   [facility] Donita Brooks, MD   3 years ago Bilateral leg weakness   [facility] Pickard, Priscille Heidelberg, MD              Passed - PLT in normal range and within 360 days    Platelets  Date Value Ref Range Status  12/24/2022 221 140 - 400 Thousand/uL Final  02/25/2019 253 150 - 450 x10E3/uL Final         Passed - AST in normal range and within 360 days    AST  Date Value Ref Range Status  12/16/2022 30 15 - 41 U/L Final         Passed - ALT in normal range and within 360 days    ALT  Date Value Ref Range Status  12/16/2022 26 0 - 44 U/L Final

## 2023-05-23 ENCOUNTER — Telehealth: Payer: Self-pay | Admitting: Family Medicine

## 2023-05-23 NOTE — Telephone Encounter (Signed)
 Copied from CRM [id number]. Topic: General - Other >> May 22, 2023  4:01 PM Donald Frost wrote: Reason for CRM: Danielle with [facility] is re faxing a form about de prescribing benzodiazepine therapy. Please assist further

## 2023-06-03 DIAGNOSIS — H905 Unspecified sensorineural hearing loss: Secondary | ICD-10-CM | POA: Diagnosis not present

## 2023-06-04 ENCOUNTER — Ambulatory Visit

## 2023-06-04 VITALS — Ht 63.0 in | Wt 133.0 lb

## 2023-06-04 DIAGNOSIS — Z0000 Encounter for general adult medical examination without abnormal findings: Secondary | ICD-10-CM

## 2023-06-04 NOTE — Patient Instructions (Signed)
 Ms. Benyo , Thank you for taking time to come for your Medicare Wellness Visit. I appreciate your ongoing commitment to your health goals. Please review the following plan we discussed and let me know if I can assist you in the future.   Referrals/Orders/Follow-Ups/Clinician Recommendations: Aim for 30 minutes of exercise or brisk walking, 6-8 glasses of water, and 5 servings of fruits and vegetables each day.  This is a list of the screening recommended for you and due dates:  Health Maintenance  Topic Date Due   Zoster (Shingles) Vaccine (1 of 2) Never done   COVID-19 Vaccine (4 - 2024-25 season) 10/06/2022   Pneumonia Vaccine (2 of 2 - PCV) 10/25/2024*   Flu Shot  09/05/2023   Medicare Annual Wellness Visit  06/03/2024   DTaP/Tdap/Td vaccine (2 - Td or Tdap) 10/26/2031   DEXA scan (bone density measurement)  Completed   HPV Vaccine  Aged Out   Meningitis B Vaccine  Aged Out  *Topic was postponed. The date shown is not the original due date.    Advanced directives: (ACP Link)Information on Advanced Care Planning can be found at [place]  Secretary of [facility] Advance Health Care Directives Advance Health Care Directives. [email or web address]   Next Medicare Annual Wellness Visit scheduled for next year: Yes

## 2023-06-04 NOTE — Progress Notes (Signed)
 Subjective:   [name] is a 89 y.o. who presents for a Medicare Wellness preventive visit.  Visit Complete: Virtual I connected with  [name] on 06/04/23 by a audio enabled telemedicine application and verified that I am speaking with the correct person using two identifiers.  Patient Location: Home  Provider Location: Home Office  I discussed the limitations of evaluation and management by telemedicine. The patient expressed understanding and agreed to proceed.  Vital Signs: Because this visit was a virtual/telehealth visit, some criteria may be missing or patient reported. Any vitals not documented were not able to be obtained and vitals that have been documented are patient reported.  VideoDeclined- This patient declined Librarian, academic. Therefore the visit was completed with audio only.  Persons Participating in Visit: Patient.  AWV Questionnaire: No: Patient Medicare AWV questionnaire was not completed prior to this visit.  Cardiac Risk Factors include: advanced age (>32men, >68 women);dyslipidemia;hypertension     Objective:    Today's Vitals   06/04/23 1223  Weight: 133 lb (60.3 kg)  Height: 5\' 3"  (1.6 m)   Body mass index is 23.56 kg/m.     06/04/2023   12:41 PM 12/16/2022    8:48 AM 11/21/2022    2:21 AM 11/20/2022    5:10 PM 04/09/2022    9:41 AM 03/16/2021    9:18 AM  Advanced Directives  Does Patient Have a Medical Advance Directive? No Yes  No Yes Yes  Type of Special educational needs teacher of [place];Living will   Healthcare Power of [place];Living will Healthcare Power of [place];Living will  Copy of Healthcare Power of Attorney in Chart?     No - copy requested No - copy requested  Would patient like information on creating a medical advance directive? Yes (MAU/Ambulatory/Procedural Areas - Information given)  No - Patient declined       Current Medications (verified) Outpatient Encounter  Medications as of 06/04/2023  Medication Sig   ALPRAZolam  (XANAX ) 0.5 MG tablet Take 1 tablet (0.5 mg total) by mouth 3 (three) times daily as needed. for anxiety   amLODipine  (NORVASC ) 5 MG tablet Take 1 tablet by mouth once daily   apixaban  (ELIQUIS ) 2.5 MG TABS tablet Take 1 tablet (2.5 mg total) by mouth 2 (two) times daily.   denosumab  (PROLIA ) 60 MG/ML SOSY injection Inject 60 mg into the skin every 6 (six) months.   diclofenac  Sodium (VOLTAREN ) 1 % GEL Apply topically 4 (four) times daily.   escitalopram  (LEXAPRO ) 10 MG tablet Take 1 tablet (10 mg total) by mouth daily.   predniSONE  (DELTASONE ) 10 MG tablet TAKE ONE TABLET BY MOUTH ONCE A DAY WITH BREAKFAST   traMADol  (ULTRAM ) 50 MG tablet Take 1 tablet (50 mg total) by mouth every 6 (six) hours as needed.   metoprolol  tartrate (LOPRESSOR ) 25 MG tablet Take 0.5 tablets (12.5 mg total) by mouth 2 (two) times daily.   [DISCONTINUED] predniSONE  (DELTASONE ) 20 MG tablet 3 tabs poqday 1-2, 2 tabs poqday 3-4, 1 tab poqday 5-6   No facility-administered encounter medications on file as of 06/04/2023.    Allergies (verified) Pneumococcal vaccines   History: Past Medical History:  Diagnosis Date   Cataract    CKD (chronic kidney disease) stage 3, GFR 30-59 ml/min (HCC)    DDD (degenerative disc disease), lumbar    Fatty liver disease, nonalcoholic    2007- admitted with encephalopathy unsure of cause, resolved sponatneously   Hypertension    Osteoarthritis,  knee    Pulmonary embolism (HCC)    Past Surgical History:  Procedure Laterality Date   FRACTURE SURGERY     right shoulder, both wrists    JOINT REPLACEMENT     HIP- bilateral   Family History  Problem Relation Age of Onset   Heart disease Mother    Cancer Father        metastatic unsure of primary   Early death Brother        died at 88 y/o   Liver cancer Maternal Grandmother    Healthy Son    Social History   Socioeconomic History   Marital status: Widowed     Spouse name: Not on file   Number of children: 1   Years of education: Not on file   Highest education level: Not on file  Occupational History   Not on file  Tobacco Use   Smoking status: Never   Smokeless tobacco: Never  Vaping Use   Vaping status: Never Used  Substance and Sexual Activity   Alcohol use: Not Currently   Drug use: Never   Sexual activity: Not Currently    Comment: raised tobacco, retired  Other Topics Concern   Not on file  Social History Narrative   Lives alone   Right handed    Caffeine: 1/2-1 cup coffee in AM   Social Drivers of Health   Financial Resource Strain: Low Risk  (06/04/2023)   Overall Financial Resource Strain (CARDIA)    Difficulty of Paying Living Expenses: Not hard at all  Food Insecurity: No Food Insecurity (06/04/2023)   Hunger Vital Sign    Worried About Running Out of Food in the Last Year: Never true    Ran Out of Food in the Last Year: Never true  Transportation Needs: No Transportation Needs (06/04/2023)   PRAPARE - Administrator, Civil Service (Medical): No    Lack of Transportation (Non-Medical): No  Physical Activity: Inactive (06/04/2023)   Exercise Vital Sign    Days of Exercise per Week: 0 days    Minutes of Exercise per Session: 0 min  Stress: No Stress Concern Present (06/04/2023)   [employer] of Occupational Health - Occupational Stress Questionnaire    Feeling of Stress : Not at all  Social Connections: Moderately Isolated (06/04/2023)   Social Connection and Isolation Panel [NHANES]    Frequency of Communication with Friends and Family: More than three times a week    Frequency of Social Gatherings with Friends and Family: Three times a week    Attends Religious Services: 1 to 4 times per year    Active Member of Clubs or Organizations: No    Attends Banker Meetings: Never    Marital Status: Widowed    Tobacco Counseling Counseling given: Not Answered    Clinical  Intake:  Pre-visit preparation completed: Yes  Pain : No/denies pain     Diabetes: No  No results found for: "HGBA1C"   How often do you need to have someone help you when you read instructions, pamphlets, or other written materials from your doctor or pharmacy?: 1 - Never  Interpreter Needed?: No  Information entered by :: Seabron Cypress LPN   Activities of Daily Living     06/04/2023   12:41 PM 11/21/2022    2:00 AM  In your present state of health, do you have any difficulty performing the following activities:  Hearing? 1 1  Vision? 0 0  Difficulty concentrating  or making decisions? 0 0  Walking or climbing stairs? 1   Dressing or bathing? 0   Doing errands, shopping? 0 0  Preparing Food and eating ? N   Using the Toilet? N   In the past six months, have you accidently leaked urine? Y   Do you have problems with loss of bowel control? N   Managing your Medications? N   Managing your Finances? N   Housekeeping or managing your Housekeeping? Y     Patient Care Team: Austine Lefort, MD as PCP - General (Family Medicine) Myrle Aspen, [facility] (Inactive) as Pharmacist (Pharmacist)  Indicate any recent Medical Services you may have received from other than [facility] providers in the past year (date may be approximate).     Assessment:   This is a routine wellness examination for Neviyah.  Hearing/Vision screen Hearing Screening - Comments:: Some hearing loss; followed by audiology and wears hearing aids   Vision Screening - Comments:: No vision problems; will schedule routine eye exam soon     Goals Addressed             This Visit's Progress    Prevent falls         Depression Screen     06/04/2023   12:40 PM 04/09/2022    9:40 AM 02/25/2022    3:37 PM 11/22/2021    2:10 PM 11/12/2021    2:27 PM 10/01/2021    2:41 PM 03/16/2021    9:15 AM  PHQ 2/9 Scores  PHQ - 2 Score 0 0 0 0 0 0 0  PHQ- 9 Score    0 0      Fall Risk     06/04/2023   12:41 PM  04/09/2022    9:39 AM 02/25/2022    3:37 PM 11/22/2021    2:10 PM 11/12/2021    2:27 PM  Fall Risk   Falls in the past year? 0 1 1 1  0  Number falls in past yr: 0 1 1 1    Injury with Fall? 0 1 1 1    Risk for fall due to : No Fall Risks History of fall(s);Impaired balance/gait;Orthopedic patient History of fall(s);Impaired balance/gait;Orthopedic patient History of fall(s);Impaired balance/gait;Orthopedic patient   Follow up Falls prevention discussed;Education provided;Falls evaluation completed Education provided;Falls prevention discussed Education provided;Falls prevention discussed Education provided;Falls prevention discussed     MEDICARE RISK AT HOME:  Medicare Risk at Home Any stairs in or around the home?: No If so, are there any without handrails?: No Home free of loose throw rugs in walkways, pet beds, electrical cords, etc?: Yes Adequate lighting in your home to reduce risk of falls?: Yes Life alert?: No Use of a cane, walker or w/c?: Yes Grab bars in the bathroom?: Yes Shower chair or bench in shower?: Yes Elevated toilet seat or a handicapped toilet?: Yes  TIMED UP AND GO:  Was the test performed?  No  Cognitive Function: 6CIT completed        06/04/2023   12:41 PM 04/09/2022    9:42 AM 03/16/2021    9:20 AM  6CIT Screen  What Year? 0 points 0 points 0 points  What month? 0 points 0 points 0 points  What time? 0 points 0 points 0 points  Count back from 20 0 points 0 points 2 points  Months in reverse 0 points 0 points 0 points  Repeat phrase 0 points 0 points 0 points  Total Score 0 points 0 points  2 points    Immunizations Immunization History  Administered Date(s) Administered   Fluad Quad(high Dose 65+) 12/01/2018, 11/08/2019, 12/18/2020, 11/20/2021   Fluad Trivalent(High Dose 65+) 10/31/2022   Influenza, High Dose Seasonal PF 11/07/2016, 12/18/2017   Influenza,inj,Quad PF,6+ Mos 12/27/2015   PFIZER(Purple Top)SARS-COV-2 Vaccination 02/26/2019,  03/19/2019, 11/18/2019   Pneumococcal Polysaccharide-23 07/12/2014   Tdap 10/25/2021    Screening Tests Health Maintenance  Topic Date Due   Zoster Vaccines- Shingrix (1 of 2) Never done   COVID-19 Vaccine (4 - 2024-25 season) 10/06/2022   Pneumonia Vaccine 61+ Years old (2 of 2 - PCV) 10/25/2024 (Originally 07/12/2015)   INFLUENZA VACCINE  09/05/2023   Medicare Annual Wellness (AWV)  06/03/2024   DTaP/Tdap/Td (2 - Td or Tdap) 10/26/2031   DEXA SCAN  Completed   HPV VACCINES  Aged Out   Meningococcal B Vaccine  Aged Out    Health Maintenance  Health Maintenance Due  Topic Date Due   Zoster Vaccines- Shingrix (1 of 2) Never done   COVID-19 Vaccine (4 - 2024-25 season) 10/06/2022    Additional Screening:  Vision Screening: Recommended annual ophthalmology exams for early detection of glaucoma and other disorders of the eye.  Dental Screening: Recommended annual dental exams for proper oral hygiene  Community Resource Referral / Chronic Care Management: CRR required this visit?  No   CCM required this visit?  No     Plan:     I have personally reviewed and noted the following in the patient's chart:   Medical and social history Use of alcohol, tobacco or illicit drugs  Current medications and supplements including opioid prescriptions. Patient is not currently taking opioid prescriptions. Functional ability and status Nutritional status Physical activity Advanced directives List of other physicians Hospitalizations, surgeries, and ER visits in previous 12 months Vitals Screenings to include cognitive, depression, and falls Referrals and appointments  In addition, I have reviewed and discussed with patient certain preventive protocols, quality metrics, and best practice recommendations. A written personalized care plan for preventive services as well as general preventive health recommendations were provided to patient.     Seabron Cypress [place],  [place]   05/13/8117   After Visit Summary: (MyChart) Due to this being a telephonic visit, the after visit summary with patients personalized plan was offered to patient via MyChart   Notes: Nothing significant to report at this time.

## 2023-06-10 ENCOUNTER — Encounter: Payer: Self-pay | Admitting: Family Medicine

## 2023-06-10 ENCOUNTER — Ambulatory Visit (INDEPENDENT_AMBULATORY_CARE_PROVIDER_SITE_OTHER): Admitting: Family Medicine

## 2023-06-10 VITALS — BP 120/72 | HR 53 | Temp 98.1°F | Ht 63.0 in | Wt 129.0 lb

## 2023-06-10 DIAGNOSIS — M25512 Pain in left shoulder: Secondary | ICD-10-CM | POA: Diagnosis not present

## 2023-06-10 DIAGNOSIS — F5101 Primary insomnia: Secondary | ICD-10-CM

## 2023-06-10 DIAGNOSIS — M25511 Pain in right shoulder: Secondary | ICD-10-CM | POA: Diagnosis not present

## 2023-06-10 DIAGNOSIS — L209 Atopic dermatitis, unspecified: Secondary | ICD-10-CM

## 2023-06-10 DIAGNOSIS — G8929 Other chronic pain: Secondary | ICD-10-CM

## 2023-06-10 MED ORDER — ALPRAZOLAM 0.5 MG PO TABS: 0.5000 mg | ORAL_TABLET | Freq: Three times a day (TID) | ORAL | 2 refills | Status: DC | PRN

## 2023-06-10 MED ORDER — TRIAMCINOLONE ACETONIDE 0.1 % EX CREA: 1.0000 | TOPICAL_CREAM | Freq: Two times a day (BID) | CUTANEOUS | 0 refills | Status: DC

## 2023-06-10 NOTE — Progress Notes (Signed)
 Subjective:    Patient ID: [name], female    DOB: 1932/01/15, 89 y.o.   MRN: [id number]  Patient has a patch of dry itchy skin on the right posterior aspect of her neck.  There are 3 scaly red patches in the area.  Each patch is roughly 1 cm in diameter.  They are close together in an area of about 3 inches.  They have been there for more than 6 months according to the patient.  She also complains of dry itchy skin on the left side of her neck.  There is no visible rash in that area but she states that it itches.  She also complains of severe unrelenting pain in both shoulders.  The patient jumps and screams in pain when I try to passively abduct her shoulder greater than 60 degrees on the right side.  She states that it hurts to put weight on her arms to use her walker.  It hurts to raise her arms above her head. Past Medical History:  Diagnosis Date   Cataract    CKD (chronic kidney disease) stage 3, GFR 30-59 ml/min (HCC)    DDD (degenerative disc disease), lumbar    Fatty liver disease, nonalcoholic    2007- admitted with encephalopathy unsure of cause, resolved sponatneously   Hypertension    Osteoarthritis, knee    Pulmonary embolism (HCC)    Past Surgical History:  Procedure Laterality Date   FRACTURE SURGERY     right shoulder, both wrists    JOINT REPLACEMENT     HIP- bilateral   Current Outpatient Medications on File Prior to Visit  Medication Sig Dispense Refill   ALPRAZolam  (XANAX ) 0.5 MG tablet Take 1 tablet (0.5 mg total) by mouth 3 (three) times daily as needed. for anxiety 30 tablet 2   amLODipine  (NORVASC ) 5 MG tablet Take 1 tablet by mouth once daily 90 tablet 0   apixaban  (ELIQUIS ) 2.5 MG TABS tablet Take 1 tablet (2.5 mg total) by mouth 2 (two) times daily. 60 tablet 11   denosumab  (PROLIA ) 60 MG/ML SOSY injection Inject 60 mg into the skin every 6 (six) months.     diclofenac  Sodium (VOLTAREN ) 1 % GEL Apply topically 4 (four) times daily.      escitalopram  (LEXAPRO ) 10 MG tablet Take 1 tablet (10 mg total) by mouth daily. 90 tablet 3   metoprolol  tartrate (LOPRESSOR ) 25 MG tablet Take 0.5 tablets (12.5 mg total) by mouth 2 (two) times daily. 30 tablet 3   predniSONE  (DELTASONE ) 10 MG tablet TAKE ONE TABLET BY MOUTH ONCE A DAY WITH BREAKFAST 30 tablet 6   traMADol  (ULTRAM ) 50 MG tablet Take 1 tablet (50 mg total) by mouth every 6 (six) hours as needed. 120 tablet 2   No current facility-administered medications on file prior to visit.       Allergies  Allergen Reactions   Pneumococcal Vaccines Other (See Comments)    Pt had localized reaction to injection of Pneumococcal 23 - Swelling and redness in upper arm that extend to but not beyond elbow   Social History   Socioeconomic History   Marital status: Widowed    Spouse name: Not on file   Number of children: 1   Years of education: Not on file   Highest education level: Not on file  Occupational History   Not on file  Tobacco Use   Smoking status: Never   Smokeless tobacco: Never  Vaping Use   Vaping status:  Never Used  Substance and Sexual Activity   Alcohol use: Not Currently   Drug use: Never   Sexual activity: Not Currently    Comment: raised tobacco, retired  Other Topics Concern   Not on file  Social History Narrative   Lives alone   Right handed    Caffeine: 1/2-1 cup coffee in AM   Social Drivers of Health   Financial Resource Strain: Low Risk  (06/04/2023)   Overall Financial Resource Strain (CARDIA)    Difficulty of Paying Living Expenses: Not hard at all  Food Insecurity: No Food Insecurity (06/04/2023)   Hunger Vital Sign    Worried About Running Out of Food in the Last Year: Never true    Ran Out of Food in the Last Year: Never true  Transportation Needs: No Transportation Needs (06/04/2023)   PRAPARE - Administrator, Civil Service (Medical): No    Lack of Transportation (Non-Medical): No  Physical Activity: Inactive (06/04/2023)    Exercise Vital Sign    Days of Exercise per Week: 0 days    Minutes of Exercise per Session: 0 min  Stress: No Stress Concern Present (06/04/2023)   [employer] of Occupational Health - Occupational Stress Questionnaire    Feeling of Stress : Not at all  Social Connections: Moderately Isolated (06/04/2023)   Social Connection and Isolation Panel [NHANES]    Frequency of Communication with Friends and Family: More than three times a week    Frequency of Social Gatherings with Friends and Family: Three times a week    Attends Religious Services: 1 to 4 times per year    Active Member of Clubs or Organizations: No    Attends Banker Meetings: Never    Marital Status: Widowed  Intimate Partner Violence: Not At Risk (06/04/2023)   Humiliation, Afraid, Rape, and Kick questionnaire    Fear of Current or Ex-Partner: No    Emotionally Abused: No    Physically Abused: No    Sexually Abused: No     Review of Systems  All other systems reviewed and are negative.      Objective:   Physical Exam Vitals reviewed.  Constitutional:      General: She is not in acute distress.    Appearance: She is well-developed and normal weight. She is not ill-appearing, toxic-appearing or diaphoretic.  HENT:     Mouth/Throat:     Mouth: Mucous membranes are dry.     Pharynx: No oropharyngeal exudate or posterior oropharyngeal erythema.  Eyes:     Extraocular Movements: Extraocular movements intact.     Conjunctiva/sclera: Conjunctivae normal.  Cardiovascular:     Rate and Rhythm: Normal rate and regular rhythm.     Heart sounds: Normal heart sounds. No murmur heard.    No friction rub. No gallop.  Pulmonary:     Effort: Pulmonary effort is normal. No respiratory distress.     Breath sounds: Normal breath sounds. No stridor. No wheezing or rales.  Abdominal:     General: Bowel sounds are normal. There is no distension.     Palpations: Abdomen is soft.     Tenderness: There is no  abdominal tenderness. There is no guarding or rebound.  Musculoskeletal:     Right knee: No MCL or LCL tenderness.     Right lower leg: No edema.     Left lower leg: No edema.  Lymphadenopathy:     Cervical: No cervical adenopathy.  Skin:  Findings: Rash present. No bruising, erythema or lesion. Rash is macular. Rash is not papular, pustular, scaling, urticarial or vesicular.       Neurological:     General: No focal deficit present.     Mental Status: She is alert and oriented to person, place, and time.     Cranial Nerves: No cranial nerve deficit.     Motor: No weakness.     Coordination: Coordination normal.     Gait: Gait normal.     Deep Tendon Reflexes: Reflexes normal.  Psychiatric:        Attention and Perception: Attention and perception normal.        Mood and Affect: Mood is not elated. Affect is not labile, blunt, flat, angry or tearful.        Speech: Speech normal.        Behavior: Behavior is not agitated, slowed, aggressive, withdrawn, hyperactive or combative.        Assessment & Plan:  Chronic pain of both shoulders  Atopic dermatitis, unspecified type I will treat the atopic dermatitis on her neck with triamcinolone cream 2 times a day as needed for itching.  I believe the pain in her shoulders is a combination of osteoarthritis as well as impingement syndrome and chronic tendinosis in the rotator cuff.  Patient is already on prednisone  and is unable to tolerate NSAIDs due to the fact she is on permanent Eliquis  given her history of an unprovoked pulmonary embolism.  Therefore I recommended cortisone injections in both shoulders to try to alleviate the pain.  Using sterile technique, I injected the right shoulder with a mixture of 2 cc of lidocaine , 2 cc of Marcaine , 2 cc of 40 mg/mL Kenalog.  Next hide injected the left shoulder with 2 cc lidocaine , 2 cc of Marcaine , and 2 cc of 40 mg/mL Kenalog using sterile technique.  The patient tolerated the procedure  without complication

## 2023-06-11 MED ORDER — TRIAMCINOLONE ACETONIDE 40 MG/ML IJ SUSP
40.0000 mg | Freq: Once | INTRAMUSCULAR | Status: AC
  Administered 2023-06-10: 80 mg via INTRAMUSCULAR

## 2023-06-11 NOTE — Addendum Note (Signed)
 Addended by: Gillermo Lack K on: 06/11/2023 11:24 AM   Modules accepted: Orders

## 2023-06-21 ENCOUNTER — Emergency Department (HOSPITAL_COMMUNITY)

## 2023-06-21 ENCOUNTER — Other Ambulatory Visit: Payer: Self-pay

## 2023-06-21 ENCOUNTER — Emergency Department (HOSPITAL_COMMUNITY)
Admission: EM | Admit: 2023-06-21 | Discharge: 2023-06-21 | Disposition: A | Discharge status: Home / Self Care | Attending: Emergency Medicine | Admitting: Emergency Medicine

## 2023-06-21 ENCOUNTER — Encounter (HOSPITAL_COMMUNITY): Payer: Self-pay | Admitting: Emergency Medicine

## 2023-06-21 DIAGNOSIS — W050XXA Fall from non-moving wheelchair, initial encounter: Secondary | ICD-10-CM | POA: Insufficient documentation

## 2023-06-21 DIAGNOSIS — S51011A Laceration without foreign body of right elbow, initial encounter: Secondary | ICD-10-CM | POA: Insufficient documentation

## 2023-06-21 DIAGNOSIS — Z043 Encounter for examination and observation following other accident: Secondary | ICD-10-CM | POA: Diagnosis not present

## 2023-06-21 DIAGNOSIS — M25522 Pain in left elbow: Secondary | ICD-10-CM | POA: Insufficient documentation

## 2023-06-21 DIAGNOSIS — M47814 Spondylosis without myelopathy or radiculopathy, thoracic region: Secondary | ICD-10-CM | POA: Diagnosis not present

## 2023-06-21 DIAGNOSIS — M25521 Pain in right elbow: Secondary | ICD-10-CM | POA: Diagnosis not present

## 2023-06-21 DIAGNOSIS — I10 Essential (primary) hypertension: Secondary | ICD-10-CM | POA: Diagnosis not present

## 2023-06-21 DIAGNOSIS — Z79899 Other long term (current) drug therapy: Secondary | ICD-10-CM | POA: Insufficient documentation

## 2023-06-21 DIAGNOSIS — N189 Chronic kidney disease, unspecified: Secondary | ICD-10-CM | POA: Diagnosis not present

## 2023-06-21 DIAGNOSIS — Z96643 Presence of artificial hip joint, bilateral: Secondary | ICD-10-CM | POA: Diagnosis not present

## 2023-06-21 DIAGNOSIS — S0003XA Contusion of scalp, initial encounter: Secondary | ICD-10-CM | POA: Insufficient documentation

## 2023-06-21 DIAGNOSIS — Z122 Encounter for screening for malignant neoplasm of respiratory organs: Secondary | ICD-10-CM | POA: Insufficient documentation

## 2023-06-21 DIAGNOSIS — S3993XA Unspecified injury of pelvis, initial encounter: Secondary | ICD-10-CM | POA: Diagnosis not present

## 2023-06-21 DIAGNOSIS — Z7901 Long term (current) use of anticoagulants: Secondary | ICD-10-CM | POA: Insufficient documentation

## 2023-06-21 DIAGNOSIS — S51012A Laceration without foreign body of left elbow, initial encounter: Secondary | ICD-10-CM | POA: Insufficient documentation

## 2023-06-21 DIAGNOSIS — W19XXXA Unspecified fall, initial encounter: Secondary | ICD-10-CM | POA: Diagnosis not present

## 2023-06-21 DIAGNOSIS — I700 Atherosclerosis of aorta: Secondary | ICD-10-CM | POA: Diagnosis not present

## 2023-06-21 DIAGNOSIS — M47812 Spondylosis without myelopathy or radiculopathy, cervical region: Secondary | ICD-10-CM | POA: Diagnosis not present

## 2023-06-21 DIAGNOSIS — M4804 Spinal stenosis, thoracic region: Secondary | ICD-10-CM | POA: Diagnosis not present

## 2023-06-21 DIAGNOSIS — S0990XA Unspecified injury of head, initial encounter: Secondary | ICD-10-CM | POA: Diagnosis not present

## 2023-06-21 DIAGNOSIS — S299XXA Unspecified injury of thorax, initial encounter: Secondary | ICD-10-CM | POA: Diagnosis not present

## 2023-06-21 DIAGNOSIS — M129 Arthropathy, unspecified: Secondary | ICD-10-CM | POA: Diagnosis not present

## 2023-06-21 DIAGNOSIS — S59901A Unspecified injury of right elbow, initial encounter: Secondary | ICD-10-CM | POA: Diagnosis present

## 2023-06-21 DIAGNOSIS — M4312 Spondylolisthesis, cervical region: Secondary | ICD-10-CM | POA: Diagnosis not present

## 2023-06-21 LAB — CBC
HCT: 38.5 % (ref 36.0–46.0)
Hemoglobin: 12.6 g/dL (ref 12.0–15.0)
MCH: 30.8 pg (ref 26.0–34.0)
MCHC: 32.7 g/dL (ref 30.0–36.0)
MCV: 94.1 fL (ref 80.0–100.0)
Platelets: 184 10*3/uL (ref 150–400)
RBC: 4.09 MIL/uL (ref 3.87–5.11)
RDW: 13.7 % (ref 11.5–15.5)
WBC: 11.8 10*3/uL — ABNORMAL HIGH (ref 4.0–10.5)
nRBC: 0 % (ref 0.0–0.2)

## 2023-06-21 LAB — COMPREHENSIVE METABOLIC PANEL WITH GFR
ALT: 28 U/L (ref 0–44)
AST: 22 U/L (ref 15–41)
Albumin: 3.4 g/dL — ABNORMAL LOW (ref 3.5–5.0)
Alkaline Phosphatase: 43 U/L (ref 38–126)
Anion gap: 11 (ref 5–15)
BUN: 53 mg/dL — ABNORMAL HIGH (ref 8–23)
CO2: 22 mmol/L (ref 22–32)
Calcium: 9.2 mg/dL (ref 8.9–10.3)
Chloride: 105 mmol/L (ref 98–111)
Creatinine, Ser: 1.8 mg/dL — ABNORMAL HIGH (ref 0.44–1.00)
GFR, Estimated: 27 mL/min — ABNORMAL LOW (ref >60)
Glucose, Bld: 113 mg/dL — ABNORMAL HIGH (ref 70–99)
Potassium: 4.6 mmol/L (ref 3.5–5.1)
Sodium: 138 mmol/L (ref 135–145)
Total Bilirubin: 0.7 mg/dL (ref 0.0–1.2)
Total Protein: 5.6 g/dL — ABNORMAL LOW (ref 6.5–8.1)

## 2023-06-21 LAB — I-STAT CHEM 8, ED
BUN: 50 mg/dL — ABNORMAL HIGH (ref 8–23)
Calcium, Ion: 1.19 mmol/L (ref 1.15–1.40)
Chloride: 105 mmol/L (ref 98–111)
Creatinine, Ser: 1.9 mg/dL — ABNORMAL HIGH (ref 0.44–1.00)
Glucose, Bld: 113 mg/dL — ABNORMAL HIGH (ref 70–99)
HCT: 36 % (ref 36.0–46.0)
Hemoglobin: 12.2 g/dL (ref 12.0–15.0)
Potassium: 4.6 mmol/L (ref 3.5–5.1)
Sodium: 138 mmol/L (ref 135–145)
TCO2: 21 mmol/L — ABNORMAL LOW (ref 22–32)

## 2023-06-21 LAB — URINALYSIS, ROUTINE W REFLEX MICROSCOPIC
Bilirubin Urine: NEGATIVE
Glucose, UA: NEGATIVE mg/dL
Ketones, ur: NEGATIVE mg/dL
Nitrite: NEGATIVE
Protein, ur: NEGATIVE mg/dL
Specific Gravity, Urine: 1.015 (ref 1.005–1.030)
pH: 6 (ref 5.0–8.0)

## 2023-06-21 LAB — I-STAT CG4 LACTIC ACID, ED: Lactic Acid, Venous: 0.9 mmol/L (ref 0.5–1.9)

## 2023-06-21 LAB — SAMPLE TO BLOOD BANK

## 2023-06-21 LAB — PROTIME-INR
INR: 1 (ref 0.8–1.2)
Prothrombin Time: 13.5 s (ref 11.4–15.2)

## 2023-06-21 LAB — ETHANOL: Alcohol, Ethyl (B): <15 mg/dL (ref <15)

## 2023-06-21 NOTE — Progress Notes (Signed)
 Orthopedic Tech Progress Note Patient Details:  [name] 1931/09/20 [id number] Level 2 Trauma  Patient ID: [name], female   DOB: [birth year], 89 y.o.   MRN: [id number]  Phill Brazil 06/21/2023, 6:09 PM

## 2023-06-21 NOTE — ED Provider Notes (Signed)
 [name] EMERGENCY DEPARTMENT AT [facility] Provider Note   CSN: [id number] Arrival date & time: 06/21/23  1748     History  Chief Complaint  Patient presents with   [name] is a 89 y.o. female.  With a history of PE on Eliquis , CKD and osteoarthritis presents to ED after fall.  Patient suffered a fall from her wheelchair at the bottom of the ramp.  She fell backwards striking the back of her head and both elbows.  No LOC.  Daily Eliquis  with last dose today.  Complaining of pain in the left elbow.  No headaches neck pain chest pain shortness of breath abdominal pain   Fall       Home Medications Prior to Admission medications   Medication Sig Start Date End Date Taking? Authorizing Provider  ALPRAZolam  (XANAX ) 0.5 MG tablet Take 1 tablet (0.5 mg total) by mouth 3 (three) times daily as needed. for anxiety 06/10/23   Austine Lefort, MD  amLODipine  (NORVASC ) 5 MG tablet Take 1 tablet by mouth once daily 03/31/23   Austine Lefort, MD  apixaban  (ELIQUIS ) 2.5 MG TABS tablet Take 1 tablet (2.5 mg total) by mouth 2 (two) times daily. 11/29/22   Austine Lefort, MD  denosumab  (PROLIA ) 60 MG/ML SOSY injection Inject 60 mg into the skin every 6 (six) months.    [provider]  diclofenac  Sodium (VOLTAREN ) 1 % GEL Apply topically 4 (four) times daily.    [provider]  escitalopram  (LEXAPRO ) 10 MG tablet Take 1 tablet (10 mg total) by mouth daily. 12/24/22   Austine Lefort, MD  metoprolol  tartrate (LOPRESSOR ) 25 MG tablet Take 0.5 tablets (12.5 mg total) by mouth 2 (two) times daily. 04/08/23 05/08/23  Austine Lefort, MD  predniSONE  (DELTASONE ) 10 MG tablet TAKE ONE TABLET BY MOUTH ONCE A DAY WITH BREAKFAST 12/24/22   Austine Lefort, MD  traMADol  (ULTRAM ) 50 MG tablet Take 1 tablet (50 mg total) by mouth every 6 (six) hours as needed. 03/24/23   Austine Lefort, MD  triamcinolone  cream (KENALOG ) 0.1 % Apply 1 Application  topically 2 (two) times daily. 06/10/23   Austine Lefort, MD      Allergies    Pneumococcal vaccines    Review of Systems   Review of Systems  Physical Exam Updated Vital Signs BP (!) 179/74   Pulse (!) 53   Temp 97.8 F (36.6 C)   Resp 12   Ht 5\' 3"  (1.6 m)   Wt 58.7 kg   SpO2 98%   BMI 22.94 kg/m  Physical Exam Vitals and nursing note reviewed.  HENT:     Head:     Comments: Hematoma with no active bleeding over posterior occiput No skull deformity Eyes:     Extraocular Movements: Extraocular movements intact.     Pupils: Pupils are equal, round, and reactive to light.  Cardiovascular:     Rate and Rhythm: Normal rate and regular rhythm.  Pulmonary:     Effort: Pulmonary effort is normal.     Breath sounds: Normal breath sounds.  Abdominal:     Palpations: Abdomen is soft.     Tenderness: There is no abdominal tenderness.  Musculoskeletal:     Cervical back: Neck supple. No tenderness.     Comments: Small skin tear and ecchymosis over left elbow bony wrist with no deformity No evidence of trauma with full active range of motion of  the left shoulder left hand left wrist Another skin tear and ecchymosis over right elbow some bony tenderness no deformity No evidence of trauma with full active range of motion of the right shoulder hand right wrist Midline thoracic tenderness without deformity step-offs No midline tender step-off deformity of lumbar spine Full active range of motion bilateral lower extremities no evidence of trauma  Skin:    General: Skin is warm and dry.  Neurological:     Mental Status: She is alert.  Psychiatric:        Mood and Affect: Mood normal.     ED Results / Procedures / Treatments   Labs (all labs ordered are listed, but only abnormal results are displayed) Labs Reviewed  COMPREHENSIVE METABOLIC PANEL WITH GFR - Abnormal; Notable for the following components:      Result Value   Glucose, Bld 113 (*)    BUN 53 (*)     Creatinine, Ser 1.80 (*)    Total Protein 5.6 (*)    Albumin  3.4 (*)    GFR, Estimated 27 (*)    All other components within normal limits  CBC - Abnormal; Notable for the following components:   WBC 11.8 (*)    All other components within normal limits  URINALYSIS, ROUTINE W REFLEX MICROSCOPIC - Abnormal; Notable for the following components:   APPearance HAZY (*)    Hgb urine dipstick SMALL (*)    Leukocytes,Ua SMALL (*)    Bacteria, UA MANY (*)    All other components within normal limits  I-STAT CHEM 8, ED - Abnormal; Notable for the following components:   BUN 50 (*)    Creatinine, Ser 1.90 (*)    Glucose, Bld 113 (*)    TCO2 21 (*)    All other components within normal limits  URINE CULTURE  ETHANOL  PROTIME-INR  I-STAT CG4 LACTIC ACID, ED  SAMPLE TO BLOOD BANK    EKG EKG Interpretation Date/Time:  Saturday Jun 21 2023 18:01:32 EDT Ventricular Rate:  57 PR Interval:  159 QRS Duration:  89 QT Interval:  433 QTC Calculation: 422 R Axis:   39  Text Interpretation: Sinus rhythm Atrial premature complex Low voltage, precordial leads Borderline T abnormalities, lateral leads Baseline wander in lead(s) V6 Confirmed by Rafael Bun [phone]) on 06/21/2023 10:12:41 PM  Radiology CT Thoracic Spine Wo Contrast Result Date: 06/21/2023 CLINICAL DATA:  [name] EXAM: CT THORACIC SPINE WITHOUT CONTRAST TECHNIQUE: Multidetector CT images of the thoracic were obtained using the standard protocol without intravenous contrast. RADIATION DOSE REDUCTION: This exam was performed according to the departmental dose-optimization program which includes automated exposure control, adjustment of the mA and/or kV according to patient size and/or use of iterative reconstruction technique. COMPARISON:  05/20/2019 FINDINGS: Alignment: Alignment is grossly anatomic. Vertebrae: No acute fracture or focal pathologic process. Paraspinal and other soft tissues: The paraspinal soft tissues are unremarkable.  There is an 8 x 7 mm right upper lobe pulmonary nodule reference image 39/8. The remainder of the visualized portions of the lungs are clear. Atherosclerosis of the thoracoabdominal aorta. Multiple calcified gallstones are incidentally noted. Disc levels: There is marked disc space narrowing at all levels throughout the thoracic spine. Mild circumferential disc osteophyte complex at T4-5, T7-8, and T8-9 without compressive sequela. At T9-10 and T10-11 circumferential disc osteophyte complex results in symmetrical neural foraminal encroachment. At T11-12 there is severe bilateral neural foraminal encroachment and mild central canal stenosis due to circumferential disc osteophyte complex. Reconstructed images demonstrate no  additional findings. IMPRESSION: 1. No evidence of thoracic spine fracture. 2. Right solid pulmonary nodule within the upper lobe measuring 8 mm. Per Fleischner Society Guidelines, recommend a non-contrast Chest CT at 6-12 months. If patient is high risk for malignancy, consider an additional non-contrast Chest CT at 18-24 months. If patient is low risk for malignancy, non-contrast Chest CT at 18-24 months is optional. These guidelines do not apply to immunocompromised patients and patients with cancer. Follow up in patients with significant comorbidities as clinically warranted. For lung cancer screening, adhere to Lung-RADS guidelines. Reference: Radiology. 2017; 284(1):228-43. 3. Multilevel thoracic spondylosis as above, greatest at the thoracolumbar junction. 4.  Aortic Atherosclerosis (ICD10-I70.0). Electronically Signed   By: Bobbye Burrow M.D.   On: 06/21/2023 19:01   CT CERVICAL SPINE WO CONTRAST Result Date: 06/21/2023 CLINICAL DATA:  [name] EXAM: CT CERVICAL SPINE WITHOUT CONTRAST TECHNIQUE: Multidetector CT imaging of the cervical spine was performed without intravenous contrast. Multiplanar CT image reconstructions were also generated. RADIATION DOSE REDUCTION: This exam was performed  according to the departmental dose-optimization program which includes automated exposure control, adjustment of the mA and/or kV according to patient size and/or use of iterative reconstruction technique. COMPARISON:  01/10/2021 FINDINGS: Alignment: Mild degenerative anterolisthesis of C2 on C3. Straightening of the cervical spine due to extensive spondylosis from C3-4 through C5-6. Skull base and vertebrae: No acute fracture. No primary bone lesion or focal pathologic process. Soft tissues and spinal canal: No prevertebral fluid or swelling. No visible canal hematoma. Disc levels: Severe spondylosis at C3-4, C4-5, and C5-6. Moderate spondylosis at C6-7. Multilevel facet hypertrophy greatest from C2-3 through C4-5. Upper chest: Airway is patent.  Lung apices are clear. Other: Reconstructed images demonstrate no additional findings. IMPRESSION: 1. No acute cervical spine fracture. 2. Extensive multilevel cervical degenerative changes as above. Electronically Signed   By: Bobbye Burrow M.D.   On: 06/21/2023 18:56   CT HEAD WO CONTRAST Result Date: 06/21/2023 CLINICAL DATA:  [name] EXAM: CT HEAD WITHOUT CONTRAST TECHNIQUE: Contiguous axial images were obtained from the base of the skull through the vertex without intravenous contrast. RADIATION DOSE REDUCTION: This exam was performed according to the departmental dose-optimization program which includes automated exposure control, adjustment of the mA and/or kV according to patient size and/or use of iterative reconstruction technique. COMPARISON:  05/02/2007 FINDINGS: Brain: No acute infarct or hemorrhage. Lateral ventricles and midline structures are unremarkable. No acute extra-axial fluid collections. No mass effect. Likely 1.1 cm calcified para falcine meningioma at the convexity. Vascular: No hyperdense vessel or unexpected calcification. Skull: Normal. Negative for fracture or focal lesion. Sinuses/Orbits: No acute finding. Other: None. IMPRESSION: 1. No  acute intracranial process. Electronically Signed   By: Bobbye Burrow M.D.   On: 06/21/2023 18:52   DG Pelvis Portable Result Date: 06/21/2023 CLINICAL DATA:  Fall, trauma. EXAM: PORTABLE PELVIS 1-2 VIEWS COMPARISON:  [facility]rum radiographs 01/09/2022 FINDINGS: Bilateral hip arthroplasties. The inferior aspect of both femoral stones is not included in the field of view. No evidence of hardware dislocation. No periprosthetic lucency. No pelvic fracture. Pubic rami are intact. Chondrocalcinosis of the pubic symphysis. No pubic symphyseal or sacroiliac diastasis. Popcorn calcifications in the pelvis are typically calcified fibroids. IMPRESSION: 1. No pelvic fracture. 2. Bilateral hip arthroplasties without evidence of complication. Distal femoral stems are not included in the field of view. Electronically Signed   By: Chadwick Colonel M.D.   On: 06/21/2023 18:33   DG Elbow Complete Left Result Date: 06/21/2023 CLINICAL DATA:  Pain  after fall. EXAM: LEFT ELBOW - COMPLETE 3+ VIEW COMPARISON:  None Available. FINDINGS: There is no evidence of fracture, dislocation, or joint effusion. Mild enthesopathic change at the olecranon. Mild skin irregularity. IV in the antecubital fossa. IMPRESSION: No fracture or subluxation of the left elbow. Electronically Signed   By: Chadwick Colonel M.D.   On: 06/21/2023 18:31   DG Elbow Complete Right Result Date: 06/21/2023 CLINICAL DATA:  Pain after fall. EXAM: RIGHT ELBOW - COMPLETE 3+ VIEW COMPARISON:  None Available. FINDINGS: There is no evidence of fracture, dislocation, or joint effusion. Mild degenerative spurring. Small radiopaque densities posteriorly with question overlying wound VAC in place. IMPRESSION: 1. No fracture or subluxation of the right elbow. 2. Small radiopaque densities posteriorly with question overlying wound VAC in place. Electronically Signed   By: Chadwick Colonel M.D.   On: 06/21/2023 18:30   DG Chest Port 1 View Result Date: 06/21/2023 CLINICAL  DATA:  Trauma, fall. EXAM: PORTABLE CHEST 1 VIEW COMPARISON:  Insert Steen 24 FINDINGS: The cardiomediastinal contours are normal. The lungs are clear. Pulmonary vasculature is normal. No consolidation, pleural effusion, or pneumothorax. Chronic bilateral shoulder arthropathy. No evidence of acute osseous abnormality. IMPRESSION: No acute findings or evidence of traumatic injury. Electronically Signed   By: Chadwick Colonel M.D.   On: 06/21/2023 18:28    Procedures Ultrasound ED FAST  Date/Time: 06/21/2023 6:11 PM  Performed by: Sallyanne Creamer, DO Authorized by: Sallyanne Creamer, DO  Procedure details:    Indications: blunt abdominal trauma and blunt chest trauma       Assess for:  Hemothorax    Technique:  Abdominal, cardiac and chest    Images: archived      Abdominal findings:    L kidney:  Visualized   R kidney:  Visualized   Liver:  Visualized    Bladder:  Visualized, Foley catheter not visualized   Hepatorenal space visualized: identified     Splenorenal space: identified     Rectovesical free fluid: not identified     Splenorenal free fluid: not identified     Hepatorenal space free fluid: not identified   Cardiac findings:    Heart:  Visualized   Wall motion: identified     Pericardial effusion: identified (small)   Chest findings:    L lung sliding: identified     R lung sliding: identified     Fluid in thorax: not identified   Comments:     Small pericardial effusion E fast otherwise negative     Medications Ordered in ED Medications - No data to display  ED Course/ Medical Decision Making/ A&P Clinical Course as of 06/21/23 2217  Sat Jun 21, 2023  2215 No traumatic findings of CT head C-spine T-spine.  I did let them know about the pulmonary nodule that require follow-up.  Chest x-ray x-rays of both elbows and pelvis x-ray look okay.  EKG shows no ischemic changes.  CBC CMP unremarkable with kidney function at baseline.  UA shows some white blood cells  leukocytes but she is not having urinary symptoms.  Discussed rationale for antibiotics here versus watch and wait to see culture results.  At this time patient and her son at bedside feel it is reasonable to hold off on antibiotics.  Patient has remained stable here remains at her baseline and is appropriate for discharge home with PCP follow-up [MP]    Clinical Course User Index [MP] Sallyanne Creamer, DO  Medical Decision Making 89 year old female Eliquis  presents after fall out of wheelchair.  Struck the back of her head and both elbows no loss of consciousness.  Hemodynamically stable.  E-FAST negative.  Will obtain CT head C-spine thoracic spine chest x-ray to evaluate for traumatic injury and continue to monitor.  Will obtain laboratory workup per trauma panel  Amount and/or Complexity of Data Reviewed Labs: ordered. Radiology: ordered.           Final Clinical Impression(s) / ED Diagnoses Final diagnoses:  Fall, initial encounter  Contusion of scalp, initial encounter    Rx / DC Orders ED Discharge Orders     None         Sallyanne Creamer, DO 06/21/23 2217

## 2023-06-21 NOTE — Discharge Instructions (Signed)
 You were seen in the emerged apartment after fall There were no traumatic findings on the CAT scans or x-rays Your urine was suggestive of UTI but it is unclear We need to send for culture to be sure We decided to hold off on antibiotics You will need to follow-up with her on the results to see if you need antibiotics to treat a urinary tract infection Follow-up with your primary care doctor in 1 week for reevaluation Return to the emergency room for repeated falls or any other concerns Continue taking all previous prescribed indication including your Eliquis 

## 2023-06-21 NOTE — ED Notes (Incomplete)
 Trauma Response Nurse Documentation   [name] is a 89 y.o. female arriving to [facility] ED via EMS  On Eliquis  (apixaban ) daily. Trauma was activated as a Level 2 by ED Charge RN based on the following trauma criteria Elderly patients > 65 with head trauma on anti-coagulation (excluding ASA).  Patient cleared for CT by Dr. Ranelle Buys. Pt transported to CT with trauma response nurse present to monitor. RN remained with the patient throughout their absence from the department for clinical observation.   GCS 15.  History   Past Medical History:  Diagnosis Date   Cataract    CKD (chronic kidney disease) stage 3, GFR 30-59 ml/min (HCC)    DDD (degenerative disc disease), lumbar    Fatty liver disease, nonalcoholic    2007- admitted with encephalopathy unsure of cause, resolved sponatneously   Hypertension    Osteoarthritis, knee    Pulmonary embolism (HCC)      Past Surgical History:  Procedure Laterality Date   FRACTURE SURGERY     right shoulder, both wrists    JOINT REPLACEMENT     HIP- bilateral     Initial Focused Assessment (If applicable, or please see trauma documentation): Airway: Intact, patent  Breathing: Breath sounds clear, equal bilaterally, No CP, No SOB Circulation: Small laceration to posterior occiput, bleeding controlled. Pulses intact centrally and peripherally.  Abrasions to bilateral elbows.   CT's Completed:   {Trauma CT:26866}   Interventions:   Plan for disposition:  {Trauma Dispo:26867}   Consults completed:  {Trauma Consults:26862} at ***.  Event Summary:  MTP Summary (If applicable):   Bedside handoff with {Trauma handoff:26863::"ED RN"} ***.    Juan Noel  Trauma Response RN  Please call TRN at [phone] for further assistance.

## 2023-06-21 NOTE — ED Notes (Signed)
 C-spine cleared by MD Ranelle Buys approval. Patient is alert/oriented, and able to follow commands. Patient denies any numbness, tingling, or weakness to neck or extremities.  Patient denied any pain on palpation of posterior midline neck with full ROM. Patient has no distracting injuries.

## 2023-06-21 NOTE — ED Triage Notes (Signed)
 Pt BIB [facility] from home as a level 2 trauma fall on blood thinners. Pt was on the wheelchair, one of the wheels fell off. Pt fell backwards and hit her head. Pt is on blood thinners. VSS.

## 2023-06-23 LAB — URINE CULTURE: Culture: >=100000 — AB

## 2023-06-24 ENCOUNTER — Telehealth: Payer: Self-pay

## 2023-06-24 ENCOUNTER — Telehealth (HOSPITAL_BASED_OUTPATIENT_CLINIC_OR_DEPARTMENT_OTHER): Payer: Self-pay | Admitting: *Deleted

## 2023-06-24 NOTE — Telephone Encounter (Signed)
 Post ED Visit - Positive Culture Follow-up  Culture report reviewed by antimicrobial stewardship pharmacist: [facility] [x]  Trinidad Funk, Pharm.D. []  Skeet Duke, Pharm.D., BCPS AQ-ID []  Leslee Rase, Pharm.D., BCPS []  Garland Junk, [address].D., BCPS []  Greasy, [address].D., BCPS, AAHIVP []  Alcide Aly, Pharm.D., BCPS, AAHIVP []  Jerri Morale, PharmD, BCPS []  Graham Laws, PharmD, BCPS []  Cleda Curly, PharmD, BCPS []  Tamar Fairly, PharmD []  Ballard Levels, PharmD, BCPS []  Ollen Beverage, PharmD  [facility] []  Arlyne Bering, PharmD []  Sherryle Don, PharmD []  Van Gelinas, PharmD []  Delila Felty, Rph []  [place]) Cleora Daft, PharmD []  Augustina Block, PharmD []  Arie Kurtz, PharmD []  Sharlyn Deaner, PharmD []  Agnes Hose, PharmD []  Kendall Pauls, PharmD []  Gladstone Lamer, PharmD []  Armanda Bern, PharmD []  Tera Fellows, PharmD   Positive urine culture CC: fall from w/c on ramp; mechanical.  No urinary s/s.  No further patient follow-up is required at this time.  Zeb Heys 06/24/2023, 10:16 AM

## 2023-06-24 NOTE — Telephone Encounter (Signed)
 Please advise below.   Copied from CRM [id number]. Topic: Clinical - Lab/Test Results >> Jun 24, 2023  1:49 PM Chrystal Crape R wrote: Pt would like to know if Her hospital results have been released to the office as she has not heard back. Pleas Call pt son if she does not answer her phone. Home health nurse will be back 5/22 to assistant pt with any additional questions. Pt is scheduled for a hospital follow up for 05/23 @3  pm.

## 2023-06-25 ENCOUNTER — Telehealth: Payer: Self-pay

## 2023-06-25 NOTE — Telephone Encounter (Signed)
 Copied from CRM [id number]. Topic: General - Other >> Jun 25, 2023  1:32 PM Carlatta H wrote: Reason for CRM: Patient son Mont Antis was returning Starr Eddy call//Please call back

## 2023-06-26 ENCOUNTER — Other Ambulatory Visit: Payer: Self-pay | Admitting: Family Medicine

## 2023-06-26 MED ORDER — CEPHALEXIN 500 MG PO CAPS
500.0000 mg | ORAL_CAPSULE | Freq: Three times a day (TID) | ORAL | 0 refills | Status: DC
Start: 2023-06-26 — End: 2023-08-18

## 2023-06-27 ENCOUNTER — Inpatient Hospital Stay: Admitting: Family Medicine

## 2023-07-14 ENCOUNTER — Ambulatory Visit: Payer: Self-pay

## 2023-07-14 NOTE — Telephone Encounter (Signed)
 Pt unsure if she should be taking both amlodipine  and metoprolol  or just the metoprolol . Please advise and contact the pt.   Copied from CRM [id number]. Topic: Clinical - Red Word Triage >> Jul 14, 2023  9:25 AM Everette C wrote: [facility] that prompted transfer to Nurse Triage: The patient shares that their feet have swollen recently and they are concerned that they are not and have not been taking their metoprolol  tartrate (LOPRESSOR ) 25 MG tablet [[id number]]  prescription correctly

## 2023-07-18 ENCOUNTER — Ambulatory Visit: Payer: Self-pay | Admitting: Family Medicine

## 2023-07-18 ENCOUNTER — Encounter: Payer: Self-pay | Admitting: Family Medicine

## 2023-07-18 ENCOUNTER — Ambulatory Visit (INDEPENDENT_AMBULATORY_CARE_PROVIDER_SITE_OTHER): Admitting: Family Medicine

## 2023-07-18 VITALS — BP 160/74 | HR 57 | Temp 99.0°F | Ht 63.0 in | Wt 133.2 lb

## 2023-07-18 DIAGNOSIS — I10 Essential (primary) hypertension: Secondary | ICD-10-CM | POA: Diagnosis not present

## 2023-07-18 DIAGNOSIS — N390 Urinary tract infection, site not specified: Secondary | ICD-10-CM | POA: Diagnosis not present

## 2023-07-18 DIAGNOSIS — N184 Chronic kidney disease, stage 4 (severe): Secondary | ICD-10-CM | POA: Diagnosis not present

## 2023-07-18 DIAGNOSIS — R300 Dysuria: Secondary | ICD-10-CM | POA: Diagnosis not present

## 2023-07-18 DIAGNOSIS — R600 Localized edema: Secondary | ICD-10-CM | POA: Diagnosis not present

## 2023-07-18 LAB — URINALYSIS, ROUTINE W REFLEX MICROSCOPIC
Bacteria, UA: NONE SEEN /HPF
Bilirubin Urine: NEGATIVE
Glucose, UA: NEGATIVE
Hgb urine dipstick: NEGATIVE
Hyaline Cast: NONE SEEN /LPF
Ketones, ur: NEGATIVE
Nitrite: NEGATIVE
Protein, ur: NEGATIVE
Specific Gravity, Urine: 1.015 (ref 1.001–1.035)
pH: 6 (ref 5.0–8.0)

## 2023-07-18 LAB — MICROSCOPIC MESSAGE

## 2023-07-18 MED ORDER — AMLODIPINE BESYLATE 2.5 MG PO TABS: 2.5000 mg | ORAL_TABLET | Freq: Every day | ORAL | 1 refills | Status: DC

## 2023-07-18 MED ORDER — NITROFURANTOIN MONOHYD MACRO 100 MG PO CAPS: 100.0000 mg | ORAL_CAPSULE | Freq: Two times a day (BID) | ORAL | 0 refills | Status: DC

## 2023-07-18 NOTE — Telephone Encounter (Signed)
 Informed patient son of bacteria results.  Macrobid sent to the pharmacy.

## 2023-07-18 NOTE — Progress Notes (Signed)
 Patient Office Visit  Assessment & Plan:  Urinary tract infection without hematuria, site unspecified -     Urinalysis, Routine w reflex microscopic -     Urine Culture; Future  Dysuria -     Urinalysis, Routine w reflex microscopic -     Urine Culture; Future  Localized edema -     CBC with Differential/Platelet -     Comprehensive metabolic panel with GFR -     TSH  Essential hypertension -     amLODIPine  Besylate; Take 1 tablet (2.5 mg total) by mouth daily.  Dispense: 90 tablet; Refill: 1 -     CBC with Differential/Platelet -     Comprehensive metabolic panel with GFR  CKD (chronic kidney disease) stage 4, GFR 15-29 ml/min (HCC)   Assessment and Plan    Urinary Tract Infection (UTI) Concern for recurrent or persistent UTI due to previous resistance pattern. - Order urine sample for analysis. - Encourage increased water intake.  Hypertension Elevated blood pressure likely due to lack of amlodipine . Discussed necessity of amlodipine  despite potential for peripheral edema. - Order refill of amlodipine . - Advise to monitor salt intake. - Encourage to raise legs to reduce swelling.  Peripheral Edema Persistent leg and foot swelling, possibly exacerbated by amlodipine  and history of blood clots. Discussed risk of skin breakdown and infection. - Encourage to elevate legs. will restart Norvasc  at a lower dosage.  - Monitor for signs of skin breakdown or infection.  Previous history of Deep Vein Thrombosis (DVT) and Pulmonary Embolism (PE) On Eliquis  for anticoagulation to prevent recurrence of clots. - Continue Eliquis .  General Health Maintenance Emphasized importance of low-salt diet for blood pressure management. - Encourage low-salt diet.        Will notify patient re the initial UA and determine if she needs an antibiotic.  Return in about 4 weeks (around 08/15/2023), or if symptoms worsen or fail to improve, for hypertension.   Subjective:    Patient  ID: [name], female    DOB: 1931/06/04  Age: 89 y.o. MRN: [id number]  Chief Complaint  Patient presents with   Edema    PT comes in today with complaints of swelling in both feet and ankles that slightly travels of the leg for 2 weeks with no improvement.   Urinary Tract Infection    Pt also has complaints of possible UTI with symptoms of burning with urination. Pt was on keflex  late may 2025 but never finished the prescription.    Urinary Tract Infection    Discussed the use of AI scribe software for clinical note transcription with the patient, who gave verbal consent to proceed.  History of Present Illness         [name] is an 89 year old female with hypertension and a history of blood clots who presents with medication management issues and leg swelling and possible UTI  In mid-May, she visited the ER due to a fall and was incidentally found to have a urinary tract infection caused by Klebsiella, resistant to ampicillin. She was treated with Keflex , experienced illness during treatment, and had confusion about missed doses. She experiences intermittent urinary frequency but no dysuria or hematuria. She drinks two to three cups of water daily, acknowledging it may be insufficient. patient stopped the Keflex - unclear how many days she took the antibiotic  She reports significant swelling in her legs and feet, worsening around the time she took the Keflex  so she stopped Keflex .  The swelling is persistent and does not significantly reduce overnight. She has a history of blood clots in her lungs and leg, for which she is on Eliquis . Her blood pressure has been elevated, with recent readings around 160 mmHg per caregiver. Pt is only taking Metoprolol  1/2 tablet twice per day (she had 2 bottles of this and thought they were different).   Her current medications include metoprolol  12.5 mg twice a day, but she has not been taking amlodipine  due to running out. She has  experienced confusion regarding her medication regimen, particularly with dosages and prescriptions. She lives alone but has family nearby, including a son who lives across the street. She prepares her meals but does not cook extensively, often using the microwave.  She has a history of skin issues, including blisters and bruising on her legs, attributed to tight compression stockings which she stopped using. She has been to a wound doctor in the past for these issues. Her breathing is fine with no recent respiratory issues ie no shortness of breath. She uses hearing aids and has had a heart murmur for some time. Physical Exam VITALS: BP- 160/74 CARDIOVASCULAR: Heart murmur present (not new per patient) Results LABS Liver function tests: elevated Urine culture: Klebsiella, resistant to ampicillin (06/2023) Assessment & Plan Urinary Tract Infection (UTI) Concern for recurrent or persistent UTI due to previous resistance pattern. - Order urine sample for analysis. - Encourage increased water intake.  Hypertension Elevated blood pressure likely due to lack of amlodipine . Discussed necessity of amlodipine  despite potential for peripheral edema. Will start lower dosage of 2.5mg  instead of 5mg . Monitor BP at home.  - Order refill of amlodipine . - Advise to monitor salt intake. - Encourage to raise legs to reduce swelling.  Peripheral Edema Persistent leg and foot swelling, possibly exacerbated by amlodipine  and history of blood clots. Discussed risk of skin breakdown and infection. - Encourage to elevate legs. will restart Norvasc  at a lower dosage.  - Monitor for signs of skin breakdown or infection.  Previous history of Deep Vein Thrombosis (DVT) and Pulmonary Embolism (PE) On Eliquis  for anticoagulation to prevent recurrence of clots. - Continue Eliquis .  General Health Maintenance Emphasized importance of low-salt diet for blood pressure management. - Encourage low-salt diet.   The  ASCVD Risk score (Arnett DK, et al., 2019) failed to calculate for the following reasons:   The 2019 ASCVD risk score is only valid for ages 68 to 15  Past Medical History:  Diagnosis Date   Cataract    CKD (chronic kidney disease) stage 3, GFR 30-59 ml/min (HCC)    DDD (degenerative disc disease), lumbar    Fatty liver disease, nonalcoholic    2007- admitted with encephalopathy unsure of cause, resolved sponatneously   Hypertension    Osteoarthritis, knee    Pulmonary embolism (HCC)    Past Surgical History:  Procedure Laterality Date   FRACTURE SURGERY     right shoulder, both wrists    JOINT REPLACEMENT     HIP- bilateral   Social History   Tobacco Use   Smoking status: Never   Smokeless tobacco: Never  Vaping Use   Vaping status: Never Used  Substance Use Topics   Alcohol use: Not Currently   Drug use: Never   Family History  Problem Relation Age of Onset   Heart disease Mother    Cancer Father        metastatic unsure of primary   Early death Brother  died at 75 y/o   Liver cancer Maternal Grandmother    Healthy Son    Allergies  Allergen Reactions   Pneumococcal Vaccines Other (See Comments)    Pt had localized reaction to injection of Pneumococcal 23 - Swelling and redness in upper arm that extend to but not beyond elbow    ROS    Objective:    BP (!) 160/74   Pulse (!) 57   Temp 99 F (37.2 C)   Ht 5' 3 (1.6 m)   Wt 133 lb 3.2 oz (60.4 kg)   SpO2 95%   BMI 23.60 kg/m  BP Readings from Last 3 Encounters:  07/18/23 (!) 160/74  06/21/23 (!) 168/70  06/10/23 120/72   Wt Readings from Last 3 Encounters:  07/18/23 133 lb 3.2 oz (60.4 kg)  06/21/23 129 lb 8 oz (58.7 kg)  06/10/23 129 lb (58.5 kg)    Physical Exam Vitals and nursing note reviewed.  Constitutional:      General: She is not in acute distress.    Appearance: Normal appearance.     Comments: Comes in with caregiver, using walker  HENT:     Head: Normocephalic.      Ears:     Comments: Has hearing aids  Eyes:     Extraocular Movements: Extraocular movements intact.     Conjunctiva/sclera: Conjunctivae normal.     Pupils: Pupils are equal, round, and reactive to light.    Cardiovascular:     Rate and Rhythm: Normal rate and regular rhythm.     Heart sounds: Murmur heard.  Pulmonary:     Effort: Pulmonary effort is normal.     Breath sounds: Normal breath sounds. No wheezing.  Abdominal:     Tenderness: There is no abdominal tenderness. There is no right CVA tenderness, left CVA tenderness, guarding or rebound.   Musculoskeletal:     Right lower leg: Edema present.     Left lower leg: Edema present.     Comments: Has blisters on top of both feet (bruising over both lower legs)- pt states unchanged.    Neurological:     General: No focal deficit present.     Mental Status: She is alert and oriented to person, place, and time.   Psychiatric:        Mood and Affect: Mood normal.        Behavior: Behavior normal.        Thought Content: Thought content normal.        Judgment: Judgment normal.      No results found for any visits on 07/18/23.

## 2023-07-19 LAB — COMPREHENSIVE METABOLIC PANEL WITH GFR
AG Ratio: 2 (calc) (ref 1.0–2.5)
ALT: 35 U/L — ABNORMAL HIGH (ref 6–29)
AST: 23 U/L (ref 10–35)
Albumin: 3.8 g/dL (ref 3.6–5.1)
Alkaline phosphatase (APISO): 59 U/L (ref 37–153)
BUN/Creatinine Ratio: 26 (calc) — ABNORMAL HIGH (ref 6–22)
BUN: 41 mg/dL — ABNORMAL HIGH (ref 7–25)
CO2: 23 mmol/L (ref 20–32)
Calcium: 9.6 mg/dL (ref 8.6–10.4)
Chloride: 103 mmol/L (ref 98–110)
Creat: 1.58 mg/dL — ABNORMAL HIGH (ref 0.60–0.95)
Globulin: 1.9 g/dL (ref 1.9–3.7)
Glucose, Bld: 105 mg/dL — ABNORMAL HIGH (ref 65–99)
Potassium: 4.9 mmol/L (ref 3.5–5.3)
Sodium: 139 mmol/L (ref 135–146)
Total Bilirubin: 0.4 mg/dL (ref 0.2–1.2)
Total Protein: 5.7 g/dL — ABNORMAL LOW (ref 6.1–8.1)
eGFR: 31 mL/min/{1.73_m2} — ABNORMAL LOW (ref >=60)

## 2023-07-19 LAB — CBC WITH DIFFERENTIAL/PLATELET
Absolute Lymphocytes: 713 {cells}/uL — ABNORMAL LOW (ref 850–3900)
Absolute Monocytes: 508 {cells}/uL (ref 200–950)
Basophils Absolute: 43 {cells}/uL (ref 0–200)
Basophils Relative: 0.4 %
Eosinophils Absolute: 11 {cells}/uL — ABNORMAL LOW (ref 15–500)
Eosinophils Relative: 0.1 %
HCT: 38 % (ref 35.0–45.0)
Hemoglobin: 12.4 g/dL (ref 11.7–15.5)
MCH: 30.8 pg (ref 27.0–33.0)
MCHC: 32.6 g/dL (ref 32.0–36.0)
MCV: 94.3 fL (ref 80.0–100.0)
MPV: 9.9 fL (ref 7.5–12.5)
Monocytes Relative: 4.7 %
Neutro Abs: 9526 {cells}/uL — ABNORMAL HIGH (ref 1500–7800)
Neutrophils Relative %: 88.2 %
Platelets: 207 10*3/uL (ref 140–400)
RBC: 4.03 10*6/uL (ref 3.80–5.10)
RDW: 13.5 % (ref 11.0–15.0)
Total Lymphocyte: 6.6 %
WBC: 10.8 10*3/uL (ref 3.8–10.8)

## 2023-07-19 LAB — URINE CULTURE
MICRO NUMBER:: 16578023
Result:: NO GROWTH
SPECIMEN QUALITY:: ADEQUATE

## 2023-07-19 LAB — TSH: TSH: 0.7 m[IU]/L (ref 0.40–4.50)

## 2023-08-18 ENCOUNTER — Ambulatory Visit: Admitting: Family Medicine

## 2023-08-18 ENCOUNTER — Encounter: Payer: Self-pay | Admitting: Family Medicine

## 2023-08-18 ENCOUNTER — Other Ambulatory Visit: Payer: Self-pay | Admitting: Family Medicine

## 2023-08-18 VITALS — BP 120/70 | HR 65 | Temp 98.5°F | Ht 63.0 in | Wt 138.0 lb

## 2023-08-18 DIAGNOSIS — R600 Localized edema: Secondary | ICD-10-CM

## 2023-08-18 DIAGNOSIS — L03113 Cellulitis of right upper limb: Secondary | ICD-10-CM

## 2023-08-18 DIAGNOSIS — M51369 Other intervertebral disc degeneration, lumbar region without mention of lumbar back pain or lower extremity pain: Secondary | ICD-10-CM

## 2023-08-18 DIAGNOSIS — N1832 Chronic kidney disease, stage 3b: Secondary | ICD-10-CM

## 2023-08-18 DIAGNOSIS — S51819A Laceration without foreign body of unspecified forearm, initial encounter: Secondary | ICD-10-CM

## 2023-08-18 MED ORDER — PREDNISONE 5 MG PO TABS: 7.5000 mg | ORAL_TABLET | Freq: Every day | ORAL | 3 refills | Status: DC

## 2023-08-18 MED ORDER — DOXYCYCLINE HYCLATE 100 MG PO TABS
100.0000 mg | ORAL_TABLET | Freq: Two times a day (BID) | ORAL | 0 refills | Status: DC
Start: 2023-08-18 — End: 2023-08-28

## 2023-08-18 MED ORDER — FUROSEMIDE 20 MG PO TABS: 20.0000 mg | ORAL_TABLET | Freq: Every day | ORAL | 3 refills | Status: DC

## 2023-08-18 NOTE — Progress Notes (Signed)
 Subjective:    Patient ID: [name], female    DOB: Jun 24, 1931, 89 y.o.   MRN: [id number]  Patient has +1 swelling in both legs distal to the midshin.  This is likely dependent edema.  She has numerous purpura on both shins along with hyperpigmentation of both legs due to chronic swelling and dependent edema.  She denies any chest pain or shortness of breath.  She also has redness in the skin over her medial forearm from her right wrist, to the right medial upper condyle.  There are 2 pustules also seen within the erythema that extends over the medial forearm.  The skin is hot and tender and appears to be cellulitis.  She also has a skin tear over the dorsum of her left forearm that is roughly 4 inches x 2 inches.  Patient is currently on longstanding prednisone  due to osteoarthritic pain in multiple joints.  She states that she is no longer in pain and wants to try to wean off of prednisone . Past Medical History:  Diagnosis Date   Cataract    CKD (chronic kidney disease) stage 3, GFR 30-59 ml/min (HCC)    DDD (degenerative disc disease), lumbar    Fatty liver disease, nonalcoholic    2007- admitted with encephalopathy unsure of cause, resolved sponatneously   Hypertension    Osteoarthritis, knee    Pulmonary embolism (HCC)    Past Surgical History:  Procedure Laterality Date   FRACTURE SURGERY     right shoulder, both wrists    JOINT REPLACEMENT     HIP- bilateral   Current Outpatient Medications on File Prior to Visit  Medication Sig Dispense Refill   ALPRAZolam  (XANAX ) 0.5 MG tablet Take 1 tablet (0.5 mg total) by mouth 3 (three) times daily as needed. for anxiety 30 tablet 2   amLODipine  (NORVASC ) 2.5 MG tablet Take 1 tablet (2.5 mg total) by mouth daily. 90 tablet 1   apixaban  (ELIQUIS ) 2.5 MG TABS tablet Take 1 tablet (2.5 mg total) by mouth 2 (two) times daily. 60 tablet 11   denosumab  (PROLIA ) 60 MG/ML SOSY injection Inject 60 mg into the skin every 6 (six) months.  (Patient not taking: Reported on 07/18/2023)     diclofenac  Sodium (VOLTAREN ) 1 % GEL Apply topically 4 (four) times daily.     escitalopram  (LEXAPRO ) 10 MG tablet Take 1 tablet (10 mg total) by mouth daily. 90 tablet 3   metoprolol  tartrate (LOPRESSOR ) 25 MG tablet Take 0.5 tablets (12.5 mg total) by mouth 2 (two) times daily. 30 tablet 3   traMADol  (ULTRAM ) 50 MG tablet Take 1 tablet (50 mg total) by mouth every 6 (six) hours as needed. 120 tablet 2   triamcinolone  cream (KENALOG ) 0.1 % Apply 1 Application topically 2 (two) times daily. 30 g 0   No current facility-administered medications on file prior to visit.       Allergies  Allergen Reactions   Pneumococcal Vaccines Other (See Comments)    Pt had localized reaction to injection of Pneumococcal 23 - Swelling and redness in upper arm that extend to but not beyond elbow   Social History   Socioeconomic History   Marital status: Widowed    Spouse name: Not on file   Number of children: 1   Years of education: Not on file   Highest education level: Not on file  Occupational History   Not on file  Tobacco Use   Smoking status: Never   Smokeless tobacco: Never  Vaping Use   Vaping status: Never Used  Substance and Sexual Activity   Alcohol use: Not Currently   Drug use: Never   Sexual activity: Not Currently    Comment: raised tobacco, retired  Other Topics Concern   Not on file  Social History Narrative   Lives alone   Right handed    Caffeine: 1/2-1 cup coffee in AM   Social Drivers of Health   Financial Resource Strain: Low Risk  (06/04/2023)   Overall Financial Resource Strain (CARDIA)    Difficulty of Paying Living Expenses: Not hard at all  Food Insecurity: No Food Insecurity (06/04/2023)   Hunger Vital Sign    Worried About Running Out of Food in the Last Year: Never true    Ran Out of Food in the Last Year: Never true  Transportation Needs: No Transportation Needs (06/04/2023)   PRAPARE - Therapist, art (Medical): No    Lack of Transportation (Non-Medical): No  Physical Activity: Inactive (06/04/2023)   Exercise Vital Sign    Days of Exercise per Week: 0 days    Minutes of Exercise per Session: 0 min  Stress: No Stress Concern Present (06/04/2023)   [employer] of Occupational Health - Occupational Stress Questionnaire    Feeling of Stress : Not at all  Social Connections: Moderately Isolated (06/04/2023)   Social Connection and Isolation Panel    Frequency of Communication with Friends and Family: More than three times a week    Frequency of Social Gatherings with Friends and Family: Three times a week    Attends Religious Services: 1 to 4 times per year    Active Member of Clubs or Organizations: No    Attends Banker Meetings: Never    Marital Status: Widowed  Intimate Partner Violence: Not At Risk (06/04/2023)   Humiliation, Afraid, Rape, and Kick questionnaire    Fear of Current or Ex-Partner: No    Emotionally Abused: No    Physically Abused: No    Sexually Abused: No     Review of Systems  All other systems reviewed and are negative.      Objective:   Physical Exam Vitals reviewed.  Constitutional:      General: She is not in acute distress.    Appearance: She is well-developed and normal weight. She is not ill-appearing, toxic-appearing or diaphoretic.  HENT:     Mouth/Throat:     Mouth: Mucous membranes are dry.     Pharynx: No oropharyngeal exudate or posterior oropharyngeal erythema.  Eyes:     Extraocular Movements: Extraocular movements intact.     Conjunctiva/sclera: Conjunctivae normal.  Cardiovascular:     Rate and Rhythm: Normal rate and regular rhythm.     Heart sounds: Normal heart sounds. No murmur heard.    No friction rub. No gallop.  Pulmonary:     Effort: Pulmonary effort is normal. No respiratory distress.     Breath sounds: Normal breath sounds. No stridor. No wheezing or rales.  Abdominal:      General: Bowel sounds are normal. There is no distension.     Palpations: Abdomen is soft.     Tenderness: There is no abdominal tenderness. There is no guarding or rebound.  Musculoskeletal:     Right forearm: Swelling, tenderness and bony tenderness present.     Left forearm: Laceration and bony tenderness present.       Arms:     Right knee: No MCL or  LCL tenderness.     Right lower leg: Edema present.     Left lower leg: Edema present.  Lymphadenopathy:     Cervical: No cervical adenopathy.  Skin:    Findings: Erythema present. No bruising or lesion. Rash is not papular, pustular, scaling, urticarial or vesicular.      Neurological:     General: No focal deficit present.     Mental Status: She is alert and oriented to person, place, and time.     Cranial Nerves: No cranial nerve deficit.     Motor: Weakness present.     Coordination: Coordination normal.     Gait: Gait normal.     Deep Tendon Reflexes: Reflexes normal.  Psychiatric:        Attention and Perception: Attention and perception normal.        Mood and Affect: Mood is not elated. Affect is not labile, blunt, flat, angry or tearful.        Speech: Speech normal.        Behavior: Behavior is not agitated, slowed, aggressive, withdrawn, hyperactive or combative.        Assessment & Plan:  Localized edema  Stage 3b chronic kidney disease (HCC)  Skin tear of forearm without complication, initial encounter  Cellulitis of right upper extremity I will treat the cellulitis with doxycycline  100 mg twice daily for 10 days in the right arm.  Treat the skin tear in the left forearm with nonadherent gauze covered with Coban.  Likely will switch to Tegaderm next week.  Reassess cellulitis in 1 week to treat the edema in both legs with Lasix  20 mg a day and discontinue amlodipine .  Will try to wean the patient off prednisone  gradually as her pain seems to be better managed now.  Decrease prednisone  from 10 mg a day to 7.5 mg a  day.  Recheck in 1 week and consider weaning down to 5 mg if possible.  Monitor for any evidence of hypotension.  Patient can use Tylenol  and/or tramadol  for pain.

## 2023-08-25 ENCOUNTER — Other Ambulatory Visit: Payer: Self-pay | Admitting: Family Medicine

## 2023-08-25 ENCOUNTER — Ambulatory Visit: Payer: Self-pay

## 2023-08-25 ENCOUNTER — Ambulatory Visit: Admitting: Family Medicine

## 2023-08-25 NOTE — Addendum Note (Signed)
 Addended by: DUANNE LOWERS T on: 08/25/2023 04:45 PM   Modules accepted: Orders

## 2023-08-25 NOTE — Telephone Encounter (Signed)
  FYI Only or Action Required?: FYI only for provider.  Patient was last seen in primary care on 08/18/2023 by Duanne Butler DASEN, MD.  Called Nurse Triage reporting Advice Only.  Symptoms began N/A.  Interventions attempted: Other: N/A.  Symptoms are: N/A.  Triage Disposition: Call PCP When Office is Open  Patient/caregiver understands and will follow disposition?: Yes                             Copied from CRM [id number]. Topic: Clinical - Medical Advice >> Aug 25, 2023 10:04 AM Rosaria BRAVO wrote: Reason for CRM: Has places on the back of her leg that are filled with pus. Pt's daughter in law is concerned. She wants her to receive a home health nurse for wound care to prevent the patient from getting infection.   Please contact Daughter in law and not patient   Best contact: [phone] Reason for Disposition  [1] Caller requesting NON-URGENT health information AND [2] PCP's office is the best resource  Answer Assessment - Initial Assessment Questions 1. REASON FOR CALL: What is the main reason for your call? or How can I best help you?     Patient was seen in office on 08/18/23 for treatment of cellulitis. Daughter-in-law is calling to ask provider if home care can be ordered for wound care. Daughter has no new/worsening symptoms to report, but is hoping to stay on top of things in the future. Please advise.  Protocols used: Information Only Call - No Triage-A-AH

## 2023-08-26 ENCOUNTER — Telehealth: Payer: Self-pay

## 2023-08-26 ENCOUNTER — Other Ambulatory Visit: Payer: Self-pay | Admitting: Family Medicine

## 2023-08-26 NOTE — Telephone Encounter (Signed)
 Lvm for pt son. There is not a # in chart for pt daughter in law.

## 2023-08-26 NOTE — Telephone Encounter (Signed)
 Copied from CRM [id number]. Topic: General - Other >> Aug 26, 2023  9:13 AM [place] E wrote: Reason for CRM: Patient's son, Darina, was returning a call from [place], relayed message and the number for patient's daughter in law Maple) is [phone].

## 2023-08-26 NOTE — Telephone Encounter (Signed)
 See other note

## 2023-08-26 NOTE — Telephone Encounter (Signed)
 Pt daughter in law, Vickie, informed.

## 2023-08-28 ENCOUNTER — Encounter: Payer: Self-pay | Admitting: Family Medicine

## 2023-08-28 ENCOUNTER — Ambulatory Visit: Admitting: Family Medicine

## 2023-08-28 ENCOUNTER — Ambulatory Visit (HOSPITAL_COMMUNITY): Admission: RE | Admit: 2023-08-28 | Source: Ambulatory Visit

## 2023-08-28 VITALS — BP 132/80 | HR 89 | Temp 97.9°F | Ht 63.0 in | Wt 138.0 lb

## 2023-08-28 DIAGNOSIS — L03113 Cellulitis of right upper limb: Secondary | ICD-10-CM | POA: Diagnosis not present

## 2023-08-28 DIAGNOSIS — F5101 Primary insomnia: Secondary | ICD-10-CM | POA: Diagnosis not present

## 2023-08-28 DIAGNOSIS — I10 Essential (primary) hypertension: Secondary | ICD-10-CM

## 2023-08-28 MED ORDER — CEPHALEXIN 500 MG PO CAPS: 500.0000 mg | ORAL_CAPSULE | Freq: Three times a day (TID) | ORAL | 0 refills | Status: DC

## 2023-08-28 MED ORDER — METOPROLOL TARTRATE 25 MG PO TABS: 25.0000 mg | ORAL_TABLET | Freq: Two times a day (BID) | ORAL | 3 refills | Status: AC

## 2023-08-28 MED ORDER — FUROSEMIDE 20 MG PO TABS: 20.0000 mg | ORAL_TABLET | Freq: Every day | ORAL | 3 refills | Status: DC

## 2023-08-28 MED ORDER — ALPRAZOLAM 0.5 MG PO TABS: 0.5000 mg | ORAL_TABLET | Freq: Three times a day (TID) | ORAL | 2 refills | Status: DC | PRN

## 2023-08-28 MED ORDER — PREDNISONE 5 MG PO TABS: 5.0000 mg | ORAL_TABLET | Freq: Every day | ORAL | 3 refills | Status: DC

## 2023-08-28 NOTE — Progress Notes (Signed)
 Subjective:    Patient ID: [name], female    DOB: 12-Mar-1931, 89 y.o.   MRN: [id number] 08/18/23 Patient has +1 swelling in both legs distal to the midshin.  This is likely dependent edema.  [name] has numerous purpura on both shins along with hyperpigmentation of both legs due to chronic swelling and dependent edema.  [name] denies any chest pain or shortness of breath.  [name] also has redness in the skin over her medial forearm from her right wrist, to the right medial upper condyle.  There are 2 pustules also seen within the erythema that extends over the medial forearm.  The skin is hot and tender and appears to be cellulitis.  [name] also has a skin tear over the dorsum of her left forearm that is roughly 4 inches x 2 inches.  Patient is currently on longstanding prednisone  due to osteoarthritic pain in multiple joints.  [name] states that [name] is no longer in pain and wants to try to wean off of prednisone . At that time, my plan was: I will treat the cellulitis with doxycycline  100 mg twice daily for 10 days in the right arm.  Treat the skin tear in the left forearm with nonadherent gauze covered with Coban.  Likely will switch to Tegaderm next week.  Reassess cellulitis in 1 week to treat the edema in both legs with Lasix  20 mg a day and discontinue amlodipine .  Will try to wean the patient off prednisone  gradually as her pain seems to be better managed now.  Decrease prednisone  from 10 mg a day to 7.5 mg a day.  Recheck in 1 week and consider weaning down to 5 mg if possible.  Monitor for any evidence of hypotension.  Patient can use Tylenol  and/or tramadol  for pain.  08/28/23 Skin of the right forearm is still erythematous, indurated, warm to the touch, and tender.  Patient states he was only been on doxycycline  to be day but worsened.  [name] denies any fevers or chills or systemic symptoms.  The redness extends from the elbow to the wrist and stops abruptly following the compartment of the forearm.  The  skin tear on her left forearm is healing nicely.  The edema in her legs is much better.  Her blood pressure remains well-controlled despite stopping amlodipine .  Her pain is unchanged since weaning down the prednisone . Past Medical History:  Diagnosis Date   Cataract    CKD (chronic kidney disease) stage 3, GFR 30-59 ml/min (HCC)    DDD (degenerative disc disease), lumbar    Fatty liver disease, nonalcoholic    2007- admitted with encephalopathy unsure of cause, resolved sponatneously   Hypertension    Osteoarthritis, knee    Pulmonary embolism (HCC)    Past Surgical History:  Procedure Laterality Date   FRACTURE SURGERY     right shoulder, both wrists    JOINT REPLACEMENT     HIP- bilateral   Current Outpatient Medications on File Prior to Visit  Medication Sig Dispense Refill   ALPRAZolam  (XANAX ) 0.5 MG tablet Take 1 tablet (0.5 mg total) by mouth 3 (three) times daily as needed. for anxiety 30 tablet 2   amLODipine  (NORVASC ) 2.5 MG tablet Take 1 tablet (2.5 mg total) by mouth daily. 90 tablet 1   apixaban  (ELIQUIS ) 2.5 MG TABS tablet Take 1 tablet (2.5 mg total) by mouth 2 (two) times daily. 60 tablet 11   denosumab  (PROLIA ) 60 MG/ML SOSY injection Inject 60 mg into the skin  every 6 (six) months. (Patient not taking: Reported on 07/18/2023)     diclofenac  Sodium (VOLTAREN ) 1 % GEL Apply topically 4 (four) times daily.     doxycycline  (VIBRA -TABS) 100 MG tablet Take 1 tablet (100 mg total) by mouth 2 (two) times daily. 20 tablet 0   escitalopram  (LEXAPRO ) 10 MG tablet Take 1 tablet (10 mg total) by mouth daily. 90 tablet 3   furosemide  (LASIX ) 20 MG tablet Take 1 tablet (20 mg total) by mouth daily. 30 tablet 3   metoprolol  tartrate (LOPRESSOR ) 25 MG tablet Take 1/2 (one-half) tablet by mouth twice daily 30 tablet 0   predniSONE  (DELTASONE ) 5 MG tablet Take 1.5 tablets (7.5 mg total) by mouth daily with breakfast. 60 tablet 3   traMADol  (ULTRAM ) 50 MG tablet TAKE 1 TABLET BY MOUTH  EVERY 6 HOURS AS NEEDED 120 tablet 0   triamcinolone  cream (KENALOG ) 0.1 % Apply 1 Application topically 2 (two) times daily. 30 g 0   No current facility-administered medications on file prior to visit.       Allergies  Allergen Reactions   Pneumococcal Vaccines Other (See Comments)    Pt had localized reaction to injection of Pneumococcal 23 - Swelling and redness in upper arm that extend to but not beyond elbow   Social History   Socioeconomic History   Marital status: Widowed    Spouse name: Not on file   Number of children: 1   Years of education: Not on file   Highest education level: Not on file  Occupational History   Not on file  Tobacco Use   Smoking status: Never   Smokeless tobacco: Never  Vaping Use   Vaping status: Never Used  Substance and Sexual Activity   Alcohol use: Not Currently   Drug use: Never   Sexual activity: Not Currently    Comment: raised tobacco, retired  Other Topics Concern   Not on file  Social History Narrative   Lives alone   Right handed    Caffeine: 1/2-1 cup coffee in AM   Social Drivers of Health   Financial Resource Strain: Low Risk  (06/04/2023)   Overall Financial Resource Strain (CARDIA)    Difficulty of Paying Living Expenses: Not hard at all  Food Insecurity: No Food Insecurity (06/04/2023)   Hunger Vital Sign    Worried About Running Out of Food in the Last Year: Never true    Ran Out of Food in the Last Year: Never true  Transportation Needs: No Transportation Needs (06/04/2023)   PRAPARE - Administrator, Civil Service (Medical): No    Lack of Transportation (Non-Medical): No  Physical Activity: Inactive (06/04/2023)   Exercise Vital Sign    Days of Exercise per Week: 0 days    Minutes of Exercise per Session: 0 min  Stress: No Stress Concern Present (06/04/2023)   [employer] of Occupational Health - Occupational Stress Questionnaire    Feeling of Stress : Not at all  Social Connections:  Moderately Isolated (06/04/2023)   Social Connection and Isolation Panel    Frequency of Communication with Friends and Family: More than three times a week    Frequency of Social Gatherings with Friends and Family: Three times a week    Attends Religious Services: 1 to 4 times per year    Active Member of Clubs or Organizations: No    Attends Banker Meetings: Never    Marital Status: Widowed  Intimate Partner Violence: Not  At Risk (06/04/2023)   Humiliation, Afraid, Rape, and Kick questionnaire    Fear of Current or Ex-Partner: No    Emotionally Abused: No    Physically Abused: No    Sexually Abused: No     Review of Systems  All other systems reviewed and are negative.      Objective:   Physical Exam Vitals reviewed.  Constitutional:      General: [name] is not in acute distress.    Appearance: [name] is well-developed and normal weight. [name] is not ill-appearing, toxic-appearing or diaphoretic.  HENT:     Mouth/Throat:     Mouth: Mucous membranes are dry.     Pharynx: No oropharyngeal exudate or posterior oropharyngeal erythema.  Eyes:     Extraocular Movements: Extraocular movements intact.     Conjunctiva/sclera: Conjunctivae normal.  Cardiovascular:     Rate and Rhythm: Normal rate and regular rhythm.     Heart sounds: Normal heart sounds. No murmur heard.    No friction rub. No gallop.  Pulmonary:     Effort: Pulmonary effort is normal. No respiratory distress.     Breath sounds: Normal breath sounds. No stridor. No wheezing or rales.  Abdominal:     General: Bowel sounds are normal. There is no distension.     Palpations: Abdomen is soft.     Tenderness: There is no abdominal tenderness. There is no guarding or rebound.  Musculoskeletal:     Right forearm: Swelling, tenderness and bony tenderness present.     Left forearm: Laceration and bony tenderness present.       Arms:     Right knee: No MCL or LCL tenderness.     Right lower leg: Edema present.      Left lower leg: Edema present.  Lymphadenopathy:     Cervical: No cervical adenopathy.  Skin:    Findings: Erythema present. No bruising or lesion. Rash is not papular, pustular, scaling, urticarial or vesicular.      Neurological:     General: No focal deficit present.     Mental Status: [name] is alert and oriented to person, place, and time.     Cranial Nerves: No cranial nerve deficit.     Motor: Weakness present.     Coordination: Coordination normal.     Gait: Gait normal.     Deep Tendon Reflexes: Reflexes normal.  Psychiatric:        Attention and Perception: Attention and perception normal.        Mood and Affect: Mood is not elated. Affect is not labile, blunt, flat, angry or tearful.        Speech: Speech normal.        Behavior: Behavior is not agitated, slowed, aggressive, withdrawn, hyperactive or combative.        Assessment & Plan:  Cellulitis of right upper extremity  Primary insomnia - Plan: ALPRAZolam  (XANAX ) 0.5 MG tablet  Essential hypertension I will obtain CT scan of the right arm to evaluate for possible abscess in the right forearm.  Meanwhile change antibiotics to Keflex  500 mg 3 times daily for 10 days.  I refilled the patient's Xanax .  Decrease prednisone  to 5 mg a day.  Discontinue amlodipine .  Continue Lasix  20 mg daily.  Continue metoprolol  25 mg twice daily

## 2023-08-29 ENCOUNTER — Ambulatory Visit: Payer: Self-pay | Admitting: Family Medicine

## 2023-08-29 ENCOUNTER — Ambulatory Visit
Admission: RE | Admit: 2023-08-29 | Discharge: 2023-08-29 | Disposition: A | Discharge status: Home / Self Care | Source: Ambulatory Visit | Attending: Family Medicine | Admitting: Family Medicine

## 2023-08-29 DIAGNOSIS — M7989 Other specified soft tissue disorders: Secondary | ICD-10-CM | POA: Diagnosis not present

## 2023-08-29 DIAGNOSIS — L03113 Cellulitis of right upper limb: Secondary | ICD-10-CM | POA: Insufficient documentation

## 2023-08-29 DIAGNOSIS — M1811 Unilateral primary osteoarthritis of first carpometacarpal joint, right hand: Secondary | ICD-10-CM | POA: Diagnosis not present

## 2023-08-29 DIAGNOSIS — R609 Edema, unspecified: Secondary | ICD-10-CM | POA: Diagnosis not present

## 2023-09-02 ENCOUNTER — Telehealth: Payer: Self-pay

## 2023-09-02 DIAGNOSIS — M79642 Pain in left hand: Secondary | ICD-10-CM | POA: Diagnosis not present

## 2023-09-02 DIAGNOSIS — S63502A Unspecified sprain of left wrist, initial encounter: Secondary | ICD-10-CM | POA: Diagnosis not present

## 2023-09-02 NOTE — Telephone Encounter (Signed)
 Called and spoke with [facility], who states that issue has been resolved. Mjp,lpn  Copied from CRM [id number]. Topic: Clinical - Home Health Verbal Orders >> Sep 02, 2023 12:39 PM Rosaria BRAVO wrote: Jetta Mura the pt's PCA called requesting home health services for wound care   Best contact: [phone]  Requesting urgent correspondence.

## 2023-09-11 ENCOUNTER — Encounter: Payer: Self-pay | Admitting: Family Medicine

## 2023-09-11 ENCOUNTER — Ambulatory Visit: Admitting: Family Medicine

## 2023-09-11 VITALS — BP 116/62 | HR 60 | Temp 98.2°F | Ht 63.0 in | Wt 133.4 lb

## 2023-09-11 DIAGNOSIS — L02413 Cutaneous abscess of right upper limb: Secondary | ICD-10-CM

## 2023-09-11 DIAGNOSIS — L03113 Cellulitis of right upper limb: Secondary | ICD-10-CM

## 2023-09-11 MED ORDER — TRIAMCINOLONE ACETONIDE 0.1 % EX CREA
1.0000 | TOPICAL_CREAM | Freq: Two times a day (BID) | CUTANEOUS | 0 refills | Status: DC
Start: 2023-09-11 — End: 2023-10-03

## 2023-09-11 NOTE — Progress Notes (Signed)
 Subjective:    Patient ID: [name], female    DOB: 1932-01-06, 89 y.o.   MRN: [id number] 08/18/23 Patient has +1 swelling in both legs distal to the midshin.  This is likely dependent edema.  She has numerous purpura on both shins along with hyperpigmentation of both legs due to chronic swelling and dependent edema.  She denies any chest pain or shortness of breath.  She also has redness in the skin over her medial forearm from her right wrist, to the right medial upper condyle.  There are 2 pustules also seen within the erythema that extends over the medial forearm.  The skin is hot and tender and appears to be cellulitis.  She also has a skin tear over the dorsum of her left forearm that is roughly 4 inches x 2 inches.  Patient is currently on longstanding prednisone  due to osteoarthritic pain in multiple joints.  She states that she is no longer in pain and wants to try to wean off of prednisone . At that time, my plan was: I will treat the cellulitis with doxycycline  100 mg twice daily for 10 days in the right arm.  Treat the skin tear in the left forearm with nonadherent gauze covered with Coban.  Likely will switch to Tegaderm next week.  Reassess cellulitis in 1 week to treat the edema in both legs with Lasix  20 mg a day and discontinue amlodipine .  Will try to wean the patient off prednisone  gradually as her pain seems to be better managed now.  Decrease prednisone  from 10 mg a day to 7.5 mg a day.  Recheck in 1 week and consider weaning down to 5 mg if possible.  Monitor for any evidence of hypotension.  Patient can use Tylenol  and/or tramadol  for pain.  08/28/23 Skin of the right forearm is still erythematous, indurated, warm to the touch, and tender.  Patient states he was only been on doxycycline  to be day but worsened.  She denies any fevers or chills or systemic symptoms.  The redness extends from the elbow to the wrist and stops abruptly following the compartment of the forearm.  The  skin tear on her left forearm is healing nicely.  The edema in her legs is much better.  Her blood pressure remains well-controlled despite stopping amlodipine .  Her pain is unchanged since weaning down the prednisone .  At that time, my plan was: I will obtain CT scan of the right arm to evaluate for possible abscess in the right forearm.  Meanwhile change antibiotics to Keflex  500 mg 3 times daily for 10 days.  I refilled the patient's Xanax .  Decrease prednisone  to 5 mg a day.  Discontinue amlodipine .  Continue Lasix  20 mg daily.  Continue metoprolol  25 mg twice daily  09/11/23 CT scan revealed: Soft tissue swelling and edema, most pronounced at the ulnar aspect of the proximal and mid forearm. There is fluid within the deep subcutaneous soft tissues overlying the superficial fascia of the flexor compartment musculature of the proximal to mid forearm measuring approximately 3 x 1 cm transaxially and extending at least 12 cm in length. It is difficult to determine how organized this collection is in the absence of intravenous contrast. There is no gas within this area or within the adjacent soft tissues.  Patient has completed the Keflex  and essentially the rash is unchanged.  The skin has a erythematous violaceous color that extends from the wrist down to the elbow on the ulnar aspect of  the forearm.  There is induration but there is no warmth.  There are numerous skin colored papules that are 4 to 5 mm in diameter.  There are no vesicles however these papules are in a line now from the wrist to the elbow.  Question possible shingles there is mild erythema extending up onto the medial bicep.  Papules were not visible previously only the erythema.  There is no significant drainage however the patient is tender to palpation all along the rash.  Past Medical History:  Diagnosis Date   Cataract    CKD (chronic kidney disease) stage 3, GFR 30-59 ml/min (HCC)    DDD (degenerative disc disease), lumbar     Fatty liver disease, nonalcoholic    2007- admitted with encephalopathy unsure of cause, resolved sponatneously   Hypertension    Osteoarthritis, knee    Pulmonary embolism (HCC)    Past Surgical History:  Procedure Laterality Date   FRACTURE SURGERY     right shoulder, both wrists    JOINT REPLACEMENT     HIP- bilateral   Current Outpatient Medications on File Prior to Visit  Medication Sig Dispense Refill   ALPRAZolam  (XANAX ) 0.5 MG tablet Take 1 tablet (0.5 mg total) by mouth 3 (three) times daily as needed. for anxiety 30 tablet 2   apixaban  (ELIQUIS ) 2.5 MG TABS tablet Take 1 tablet (2.5 mg total) by mouth 2 (two) times daily. 60 tablet 11   cephALEXin  (KEFLEX ) 500 MG capsule Take 1 capsule (500 mg total) by mouth 3 (three) times daily. 30 capsule 0   diclofenac  Sodium (VOLTAREN ) 1 % GEL Apply topically 4 (four) times daily.     escitalopram  (LEXAPRO ) 10 MG tablet Take 1 tablet (10 mg total) by mouth daily. 90 tablet 3   furosemide  (LASIX ) 20 MG tablet Take 1 tablet (20 mg total) by mouth daily. 90 tablet 3   metoprolol  tartrate (LOPRESSOR ) 25 MG tablet Take 1 tablet (25 mg total) by mouth 2 (two) times daily. 180 tablet 3   predniSONE  (DELTASONE ) 5 MG tablet Take 1 tablet (5 mg total) by mouth daily with breakfast. 90 tablet 3   traMADol  (ULTRAM ) 50 MG tablet TAKE 1 TABLET BY MOUTH EVERY 6 HOURS AS NEEDED 120 tablet 0   triamcinolone  cream (KENALOG ) 0.1 % Apply 1 Application topically 2 (two) times daily. 30 g 0   denosumab  (PROLIA ) 60 MG/ML SOSY injection Inject 60 mg into the skin every 6 (six) months. (Patient not taking: Reported on 09/11/2023)     predniSONE  (DELTASONE ) 5 MG tablet Take 1.5 tablets (7.5 mg total) by mouth daily with breakfast. (Patient not taking: Reported on 09/11/2023) 60 tablet 3   No current facility-administered medications on file prior to visit.       Allergies  Allergen Reactions   Pneumococcal Vaccines Other (See Comments)    Pt had localized  reaction to injection of Pneumococcal 23 - Swelling and redness in upper arm that extend to but not beyond elbow   Social History   Socioeconomic History   Marital status: Widowed    Spouse name: Not on file   Number of children: 1   Years of education: Not on file   Highest education level: Not on file  Occupational History   Not on file  Tobacco Use   Smoking status: Never   Smokeless tobacco: Never  Vaping Use   Vaping status: Never Used  Substance and Sexual Activity   Alcohol use: Not Currently   Drug  use: Never   Sexual activity: Not Currently    Comment: raised tobacco, retired  Other Topics Concern   Not on file  Social History Narrative   Lives alone   Right handed    Caffeine: 1/2-1 cup coffee in AM   Social Drivers of Health   Financial Resource Strain: Low Risk  (06/04/2023)   Overall Financial Resource Strain (CARDIA)    Difficulty of Paying Living Expenses: Not hard at all  Food Insecurity: No Food Insecurity (06/04/2023)   Hunger Vital Sign    Worried About Running Out of Food in the Last Year: Never true    Ran Out of Food in the Last Year: Never true  Transportation Needs: No Transportation Needs (06/04/2023)   PRAPARE - Administrator, Civil Service (Medical): No    Lack of Transportation (Non-Medical): No  Physical Activity: Inactive (06/04/2023)   Exercise Vital Sign    Days of Exercise per Week: 0 days    Minutes of Exercise per Session: 0 min  Stress: No Stress Concern Present (06/04/2023)   [employer] of Occupational Health - Occupational Stress Questionnaire    Feeling of Stress : Not at all  Social Connections: Moderately Isolated (06/04/2023)   Social Connection and Isolation Panel    Frequency of Communication with Friends and Family: More than three times a week    Frequency of Social Gatherings with Friends and Family: Three times a week    Attends Religious Services: 1 to 4 times per year    Active Member of Clubs or  Organizations: No    Attends Banker Meetings: Never    Marital Status: Widowed  Intimate Partner Violence: Not At Risk (06/04/2023)   Humiliation, Afraid, Rape, and Kick questionnaire    Fear of Current or Ex-Partner: No    Emotionally Abused: No    Physically Abused: No    Sexually Abused: No     Review of Systems  All other systems reviewed and are negative.      Objective:   Physical Exam Vitals reviewed.  Constitutional:      General: She is not in acute distress.    Appearance: She is well-developed and normal weight. She is not ill-appearing, toxic-appearing or diaphoretic.  HENT:     Mouth/Throat:     Mouth: Mucous membranes are dry.     Pharynx: No oropharyngeal exudate or posterior oropharyngeal erythema.  Eyes:     Extraocular Movements: Extraocular movements intact.     Conjunctiva/sclera: Conjunctivae normal.  Cardiovascular:     Rate and Rhythm: Normal rate and regular rhythm.     Heart sounds: Normal heart sounds. No murmur heard.    No friction rub. No gallop.  Pulmonary:     Effort: Pulmonary effort is normal. No respiratory distress.     Breath sounds: Normal breath sounds. No stridor. No wheezing or rales.  Abdominal:     General: Bowel sounds are normal. There is no distension.     Palpations: Abdomen is soft.     Tenderness: There is no abdominal tenderness. There is no guarding or rebound.  Musculoskeletal:     Right forearm: Swelling, tenderness and bony tenderness present.       Arms:     Right knee: No MCL or LCL tenderness.     Right lower leg: Edema present.     Left lower leg: Edema present.  Lymphadenopathy:     Cervical: No cervical adenopathy.  Skin:  Findings: Erythema present. No bruising or lesion. Rash is not papular, pustular, scaling, urticarial or vesicular.  Neurological:     General: No focal deficit present.     Mental Status: She is alert and oriented to person, place, and time.     Cranial Nerves: No  cranial nerve deficit.     Motor: Weakness present.     Coordination: Coordination normal.     Gait: Gait normal.     Deep Tendon Reflexes: Reflexes normal.  Psychiatric:        Attention and Perception: Attention and perception normal.        Mood and Affect: Mood is not elated. Affect is not labile, blunt, flat, angry or tearful.        Speech: Speech normal.        Behavior: Behavior is not agitated, slowed, aggressive, withdrawn, hyperactive or combative.        Assessment & Plan:   Cellulitis of right upper extremity I question if the patient may have shingles in the area of the rash now causing pain.  The erythema persist but it does not feel hot to the touch.  There is induration and tenderness but I question if this may be postherpetic neuralgia.  I am concerned about the possible fluid collection mentioned in the CT scan.  Unfortunately this was a noncontrasted CT scan due to the patient's renal function.  Therefore I will consult interventional radiology and ask for an ultrasound of the ulnar flexor compartment.  If there is a loculated fluid collection, I will see if they can perform a needle aspiration for culture.  The patient may require open surgical debridement if there is a purulent fluid collection.  Meanwhile I will treat the topical skin by applying triamcinolone  cream and wrapping gently with gauze to treat the topical irritation which may be postherpetic neuralgia

## 2023-09-12 ENCOUNTER — Telehealth: Payer: Self-pay

## 2023-09-12 ENCOUNTER — Other Ambulatory Visit: Payer: Self-pay

## 2023-09-12 ENCOUNTER — Encounter (HOSPITAL_COMMUNITY): Payer: Self-pay | Admitting: Family Medicine

## 2023-09-12 ENCOUNTER — Other Ambulatory Visit (HOSPITAL_COMMUNITY): Payer: Self-pay | Admitting: Family Medicine

## 2023-09-12 DIAGNOSIS — L02413 Cutaneous abscess of right upper limb: Secondary | ICD-10-CM

## 2023-09-12 NOTE — Telephone Encounter (Signed)
 Per Rock Nottingham:  [name], this patient will also need the Interventional Radiology order placed in ANCILLARY ORDERS. Thanks   I tried to enter them but for some reason, it won't let me. I am going to fax the current order to Central Scheduling, so hopefully they can go ahead and get started on it. Thanks.

## 2023-09-14 ENCOUNTER — Other Ambulatory Visit: Payer: Self-pay | Admitting: Family Medicine

## 2023-09-14 DIAGNOSIS — L02413 Cutaneous abscess of right upper limb: Secondary | ICD-10-CM

## 2023-09-15 NOTE — Progress Notes (Unsigned)
 Philip Cornet, MD  Baldwin Rosella L US  guided aspiration.  No sedation.  There may not even be fluid to aspirate.  [place]       Previous Messages    ----- Message ----- From: Baldwin Rosella CROME Sent: 09/12/2023   3:08 PM EDT To: Rosella CROME Baldwin; Ir Procedure Requests Subject: US  FINE NEEDLE ASP 1ST LESION                  Procedure :US  FINE NEEDLE ASP 1ST LESION  Reason :Possible 3x1 cm fluid collection in forearm Dx: Abscess of right arm   History :CT FOREARM RIGHT WO CONTRAST,CT Thoracic Spine Wo Contrast,CT CERVICAL SPINE WO CONTRAST,CT HEAD WO CONTRAST ,DG Elbow Complete Right,DG Elbow Complete Left,DG Pelvis Portable ,DG Chest Port 1 View  Provider: Duanne Butler DASEN, MD  Provider contact  [phone]

## 2023-09-16 ENCOUNTER — Telehealth: Payer: Self-pay | Admitting: Family Medicine

## 2023-09-16 NOTE — Telephone Encounter (Signed)
 FYI Only or Action Required?: Pt calling to find out if she should still go to her Ultrasound appointment on Thursday as the sores are going away.  Patient was last seen in primary care on 09/11/2023 by Duanne Butler DASEN, MD.  Called Nurse Triage reporting No chief complaint on file..  Symptoms began today.  Interventions attempted: Nothing.  Symptoms are: stable.  Triage Disposition: No disposition on file.  Patient/caregiver understands and will follow disposition?:     Copied from CRM [id number]. Topic: Clinical - Medical Advice >> Sep 16, 2023  2:40 PM [name] wrote: Pt calling to find out if she should still go to her Ultrasound appointment on Thursday as the sores are going away.

## 2023-09-17 ENCOUNTER — Telehealth: Payer: Self-pay

## 2023-09-17 NOTE — Telephone Encounter (Signed)
 Spoke with pt's son earlier today and advised him to have pt to go to appointment. Darina, verbalized understanding of all. Mjp,lpn  Copied from CRM [id number]. Topic: Clinical - Medical Advice >> Sep 16, 2023  2:40 PM Winona SAUNDERS wrote: Pt calling to find out if she should still go to her Ultrasound appointment on Thursday as the sores are going away. >> Sep 17, 2023 10:18 AM Donna BRAVO wrote: Patient son Darina returning call from Angelena Ronal Slater MARLA, LPN

## 2023-09-18 ENCOUNTER — Ambulatory Visit: Payer: Self-pay | Admitting: Family Medicine

## 2023-09-18 ENCOUNTER — Ambulatory Visit (HOSPITAL_COMMUNITY)
Admission: RE | Admit: 2023-09-18 | Discharge: 2023-09-18 | Disposition: A | Discharge status: Home / Self Care | Source: Ambulatory Visit | Attending: Family Medicine | Admitting: Family Medicine

## 2023-09-18 ENCOUNTER — Other Ambulatory Visit (HOSPITAL_COMMUNITY): Payer: Self-pay | Admitting: Family Medicine

## 2023-09-18 DIAGNOSIS — L02413 Cutaneous abscess of right upper limb: Secondary | ICD-10-CM | POA: Diagnosis not present

## 2023-09-18 DIAGNOSIS — L03113 Cellulitis of right upper limb: Secondary | ICD-10-CM | POA: Diagnosis not present

## 2023-09-18 DIAGNOSIS — R600 Localized edema: Secondary | ICD-10-CM | POA: Diagnosis not present

## 2023-09-18 MED ORDER — LIDOCAINE HCL 1 % IJ SOLN
INTRAMUSCULAR | Status: AC
  Filled 2023-09-18: qty 20

## 2023-09-19 ENCOUNTER — Telehealth: Payer: Self-pay

## 2023-09-19 ENCOUNTER — Other Ambulatory Visit: Payer: Self-pay | Admitting: Family Medicine

## 2023-09-19 DIAGNOSIS — S81802A Unspecified open wound, left lower leg, initial encounter: Secondary | ICD-10-CM

## 2023-09-19 NOTE — Telephone Encounter (Signed)
 Copied from CRM #[id number]. Topic: General - Other >> Sep 19, 2023  2:32 PM Sasha H wrote: Reason for CRM: Yolande Dade [phone] from [facility] was calling to see if the pt had a referral ordered for skilled nursing or not, please give Kia a call

## 2023-09-19 NOTE — Telephone Encounter (Signed)
 MA reached out to [place] and informed her that referral has been replaced and that they should be contacted soon.

## 2023-09-22 ENCOUNTER — Other Ambulatory Visit: Payer: Self-pay | Admitting: Family Medicine

## 2023-09-24 NOTE — Telephone Encounter (Signed)
 Requested by interface surescripts. Duplicate request.  Requested Prescriptions  Refused Prescriptions Disp Refills   metoprolol  tartrate (LOPRESSOR ) 25 MG tablet [Pharmacy Med Name: Metoprolol  Tartrate 25 MG Oral Tablet] 30 tablet 0    Sig: Take 1/2 (one-half) tablet by mouth twice daily     Cardiovascular:  Beta Blockers Passed - 09/24/2023  1:50 PM      Passed - Last BP in normal range    BP Readings from Last 1 Encounters:  09/11/23 116/62         Passed - Last Heart Rate in normal range    Pulse Readings from Last 1 Encounters:  09/11/23 60         Passed - Valid encounter within last 6 months    Recent Outpatient Visits           1 week ago Cellulitis of right upper extremity   [place] [facility] Duanne Butler DASEN, MD   3 weeks ago Cellulitis of right upper extremity   [place] [facility] Duanne Butler DASEN, MD   1 month ago Localized edema   [place] [facility] Duanne Butler DASEN, MD   2 months ago Urinary tract infection without hematuria, site unspecified   [place] [facility] Aletha Bene, MD   3 months ago Chronic pain of both shoulders   [place] [facility] Pickard, Butler DASEN, MD

## 2023-09-24 NOTE — Telephone Encounter (Signed)
 Called pharmacy to verify refill request for #30 . Pharmacy staff reports waiting on pending refills to be sent . Receipt confirmed to pharmacy on 08/28/23 at 11:41 am. Pharmacy staff verified receipt and will refill Rx.

## 2023-09-25 NOTE — Telephone Encounter (Signed)
 Pharmacy sent refill request for metoprolol  tartrate (LOPRESSOR ) 25 MG tablet   Pharmacy:   [facility] [address] [place] - [address] [place] [address] Phone: [phone]  Fax: [phone]   Please advise pharmacist.

## 2023-09-29 ENCOUNTER — Other Ambulatory Visit: Payer: Self-pay | Admitting: Family Medicine

## 2023-09-29 ENCOUNTER — Telehealth: Payer: Self-pay

## 2023-09-29 DIAGNOSIS — F5101 Primary insomnia: Secondary | ICD-10-CM

## 2023-09-29 NOTE — Telephone Encounter (Unsigned)
 Copied from CRM #[id number]. Topic: Clinical - Medication Refill >> Sep 29, 2023 11:13 AM Carlatta H wrote: Medication: ALPRAZolam  (XANAX ) 0.5 MG tablet  Has the patient contacted their pharmacy? No (Agent: If no, request that the patient contact the pharmacy for the refill. If patient does not wish to contact the pharmacy document the reason why and proceed with request.) (Agent: If yes, when and what did the pharmacy advise?)  This is the patient's preferred pharmacy:  [facility] [address] [place] - [address] [place] [address] Phone: [phone] Fax: [phone]  Is this the correct pharmacy for this prescription? Yes If no, delete pharmacy and type the correct one.   Has the prescription been filled recently? No  Is the patient out of the medication? Yes  Has the patient been seen for an appointment in the last year OR does the patient have an upcoming appointment? Yes  Can we respond through MyChart? Yes  Agent: Please be advised that Rx refills may take up to 3 business days. We ask that you follow-up with your pharmacy.

## 2023-09-29 NOTE — Telephone Encounter (Signed)
 Copied from CRM #[id number]. Topic: Referral - Request for Referral >> Sep 29, 2023 11:08 AM Jasmin G wrote: Did the patient discuss referral with their provider in the last year? No (If No - schedule appointment) (If Yes - send message)  Appointment offered? No  Type of order/referral and detailed reason for visit: Pt is requesting a dermatology referral fro her irritated arm.  Preference of office, provider, location: Not discussed.  If referral order, have you been seen by this specialty before? No (If Yes, this issue or another issue? When? Where?  Can we respond through MyChart? No. Call back pt's son Mr. Darina at [phone].

## 2023-09-30 ENCOUNTER — Other Ambulatory Visit: Payer: Self-pay

## 2023-09-30 DIAGNOSIS — L03113 Cellulitis of right upper limb: Secondary | ICD-10-CM

## 2023-09-30 DIAGNOSIS — S51819A Laceration without foreign body of unspecified forearm, initial encounter: Secondary | ICD-10-CM

## 2023-09-30 DIAGNOSIS — L02413 Cutaneous abscess of right upper limb: Secondary | ICD-10-CM

## 2023-09-30 MED ORDER — ALPRAZOLAM 0.5 MG PO TABS: 0.5000 mg | ORAL_TABLET | Freq: Three times a day (TID) | ORAL | 2 refills | Status: DC | PRN

## 2023-09-30 NOTE — Telephone Encounter (Signed)
 Requested medications are due for refill today.  yes  Requested medications are on the active medications list.  yes  Last refill. 08/28/2023 #30 2 rf  Future visit scheduled.   no  Notes to clinic.  Refill not delegated.    Requested Prescriptions  Pending Prescriptions Disp Refills   ALPRAZolam  (XANAX ) 0.5 MG tablet 30 tablet 2    Sig: Take 1 tablet (0.5 mg total) by mouth 3 (three) times daily as needed. for anxiety     Not Delegated - Psychiatry: Anxiolytics/Hypnotics 2 Failed - 09/30/2023  3:18 PM      Failed - This refill cannot be delegated      Failed - Urine Drug Screen completed in last 360 days      Passed - Patient is not pregnant      Passed - Valid encounter within last 6 months    Recent Outpatient Visits           2 weeks ago Cellulitis of right upper extremity   [place] [facility] Duanne Butler DASEN, MD   1 month ago Cellulitis of right upper extremity   [place] [facility] Duanne Butler DASEN, MD   1 month ago Localized edema   [place] [facility] Duanne Butler DASEN, MD   2 months ago Urinary tract infection without hematuria, site unspecified   [place] [facility] Aletha Bene, MD   3 months ago Chronic pain of both shoulders   [place] [facility] Medicine Pickard, Butler DASEN, MD

## 2023-10-02 ENCOUNTER — Telehealth: Payer: Self-pay

## 2023-10-02 ENCOUNTER — Other Ambulatory Visit: Payer: Self-pay | Admitting: Family Medicine

## 2023-10-02 ENCOUNTER — Ambulatory Visit: Payer: Self-pay

## 2023-10-02 DIAGNOSIS — M51369 Other intervertebral disc degeneration, lumbar region without mention of lumbar back pain or lower extremity pain: Secondary | ICD-10-CM

## 2023-10-02 NOTE — Telephone Encounter (Unsigned)
 Copied from CRM #[id number]. Topic: Clinical - Medication Refill >> Oct 02, 2023 12:46 PM Debby BROCKS wrote: Medication: traMADol  (ULTRAM ) 50 MG tablet  Has the patient contacted their pharmacy? Yes (Agent: If no, request that the patient contact the pharmacy for the refill. If patient does not wish to contact the pharmacy document the reason why and proceed with request.) (Agent: If yes, when and what did the pharmacy advise?) Pharmacy advised to contact clinic for a refill  This is the patient's preferred pharmacy:  [facility] [address] [place] - [address] [place] [address] Phone: [phone] Fax: [phone]  Is this the correct pharmacy for this prescription? Yes If no, delete pharmacy and type the correct one.   Has the prescription been filled recently? No  Is the patient out of the medication? No, but running out  Has the patient been seen for an appointment in the last year OR does the patient have an upcoming appointment? Yes  Can we respond through MyChart? No  Agent: Please be advised that Rx refills may take up to 3 business days. We ask that you follow-up with your pharmacy.

## 2023-10-02 NOTE — Telephone Encounter (Signed)
 This RN spoke with patient's caregiver Lita regarding referral for home health nurse for the patient. Requesting an update on referral and best person to contact is the patient's son at [phone] Will route to office for follow up.      Copied from CRM [id number]. Topic: Clinical - Red Word Triage >> Oct 02, 2023  3:23 PM Donna BRAVO wrote: Red Word that prompted transfer to Nurse Triage: patient caregiver Lita calling asking for referral for home health nurse to go into patient home to look a wound on right fore arm. Injury happened 10/01/23 skin has torn Reason for Disposition  [1] Caller requesting NON-URGENT health information AND [2] PCP's office is the best resource  Answer Assessment - Initial Assessment Questions 1. MECHANISM: How did the injury happen?     *No Answer* 2. ONSET: When did the injury happen? (e.g., minutes, hours ago)      *No Answer* 3. LOCATION: Where is the injury located? Which arm?     *No Answer* 4. APPEARANCE of INJURY: What does the injury look like?      *No Answer* 5. SEVERITY: Can you use the arm normally?      *No Answer* 6. SWELLING or BRUISING: is there any swelling or bruising? If Yes, ask: How large is it? (e.g., inches, centimeters)      *No Answer* 7. PAIN: Is there pain? If Yes, ask: How bad is the pain? (Scale 0-10; or none, mild, moderate, severe)     *No Answer* 8. TETANUS: For any breaks in the skin, ask: When was your last tetanus booster?     *No Answer* 9. OTHER SYMPTOMS: Do you have any other symptoms?  (e.g., numbness in hand)     *No Answer* 10. PREGNANCY: Is there any chance you are pregnant? When was your last menstrual period?       *No Answer*  Answer Assessment - Initial Assessment Questions Patient's caregiver calling requesting follow up about referral placed on 09/19/2023 for home health nurse. Caregiver states she is not currently with the patient and family is concerned about the patient's skin  tears. Pt's caregiver states she has a new wound on her R arm and is needing wound care. Pt also has a wound on leg that is now healing per patient's caregiver.  1. REASON FOR CALL: What is the main reason for your call? or How can I best help you?     Updates on a referral  Protocols used: Arm Injury-A-AH, Information Only Call - No Triage-A-AH

## 2023-10-02 NOTE — Telephone Encounter (Signed)
 Pt is in need of refills on steroid cream for arms and her Tramadol . Thank you.

## 2023-10-03 ENCOUNTER — Other Ambulatory Visit: Payer: Self-pay | Admitting: Family Medicine

## 2023-10-03 DIAGNOSIS — M51369 Other intervertebral disc degeneration, lumbar region without mention of lumbar back pain or lower extremity pain: Secondary | ICD-10-CM

## 2023-10-03 MED ORDER — TRIAMCINOLONE ACETONIDE 0.1 % EX CREA: 1.0000 | TOPICAL_CREAM | Freq: Two times a day (BID) | CUTANEOUS | 0 refills | Status: AC

## 2023-10-03 MED ORDER — TRAMADOL HCL 50 MG PO TABS: 50.0000 mg | ORAL_TABLET | Freq: Four times a day (QID) | ORAL | 0 refills | Status: DC | PRN

## 2023-10-03 NOTE — Telephone Encounter (Signed)
 Requested medication (s) are due for refill today: No  Requested medication (s) are on the active medication list: Yes  Last refill:  10/03/23  Future visit scheduled: No  Notes to clinic:  Not delegated.    Requested Prescriptions  Pending Prescriptions Disp Refills   traMADol  (ULTRAM ) 50 MG tablet 120 tablet 0    Sig: Take 1 tablet (50 mg total) by mouth every 6 (six) hours as needed.     Not Delegated - Analgesics:  Opioid Agonists Failed - 10/03/2023  3:44 PM      Failed - This refill cannot be delegated      Failed - Urine Drug Screen completed in last 360 days      Passed - Valid encounter within last 3 months    Recent Outpatient Visits           3 weeks ago Cellulitis of right upper extremity   [place] [facility] Duanne Butler DASEN, MD   1 month ago Cellulitis of right upper extremity   [place] [facility] Duanne Butler DASEN, MD   1 month ago Localized edema   [place] [facility] Medicine Duanne Butler DASEN, MD   2 months ago Urinary tract infection without hematuria, site unspecified   [place] [facility] Medicine Aletha Bene, MD   3 months ago Chronic pain of both shoulders    [facility] Pickard, Butler DASEN, MD

## 2023-10-09 ENCOUNTER — Telehealth: Payer: Self-pay | Admitting: Family Medicine

## 2023-10-09 NOTE — Telephone Encounter (Signed)
 Copied from CRM [id number]. Topic: Referral - Question >> Oct 02, 2023  3:41 PM Willma R wrote: Reason for CRM: Patients Caregiver Lita is requesting the referral for home health be faxed to [facility] at [phone].  Lita can be reached at [phone]

## 2023-10-13 ENCOUNTER — Telehealth: Payer: Self-pay | Admitting: Family Medicine

## 2023-10-13 NOTE — Telephone Encounter (Unsigned)
 Copied from CRM [id number]. Topic: General - Other >> Oct 13, 2023  5:13 PM Donee H wrote: Reason for CRM: Patient's daughter in law Vickie stated was returning miss call from Ronal Bradley. Please follow back up with her at [phone]

## 2023-10-16 ENCOUNTER — Ambulatory Visit (INDEPENDENT_AMBULATORY_CARE_PROVIDER_SITE_OTHER): Admitting: Family Medicine

## 2023-10-16 ENCOUNTER — Encounter: Payer: Self-pay | Admitting: Family Medicine

## 2023-10-16 VITALS — BP 120/62 | HR 72 | Temp 97.2°F | Ht 63.0 in | Wt 133.0 lb

## 2023-10-16 DIAGNOSIS — Z23 Encounter for immunization: Secondary | ICD-10-CM

## 2023-10-16 DIAGNOSIS — R600 Localized edema: Secondary | ICD-10-CM | POA: Diagnosis not present

## 2023-10-16 DIAGNOSIS — N1832 Chronic kidney disease, stage 3b: Secondary | ICD-10-CM

## 2023-10-16 MED ORDER — FUROSEMIDE 20 MG PO TABS: 20.0000 mg | ORAL_TABLET | Freq: Every day | ORAL | 3 refills | Status: AC

## 2023-10-16 NOTE — Progress Notes (Signed)
 Subjective:    Patient ID: [name], female    DOB: 28-Apr-1931, 89 y.o.   MRN: [id number]   Home health agency had originally made this appointment to evaluate wounds on her arms and legs.  There are no active wounds on her arms.  She still has a nodular papular rash on her right forearm however it is fading after the application of triamcinolone  cream.  There is 1 large 8 mm nodular lesion on the palmar side of the distal right ulna.  She has an appointment to see dermatology pending.  However the pain and inflammation has improved dramatically with triamcinolone .  She has healing skin tears on her posterior left calf.  There is approximately 3 scabs.  There is no weeping.  There is no open skin.  There is no erythema.  These are old and have completely healed.  The scabs are dry and slowly resorbing Past Medical History:  Diagnosis Date   Cataract    CKD (chronic kidney disease) stage 3, GFR 30-59 ml/min (HCC)    DDD (degenerative disc disease), lumbar    Fatty liver disease, nonalcoholic    2007- admitted with encephalopathy unsure of cause, resolved sponatneously   Hypertension    Osteoarthritis, knee    Pulmonary embolism (HCC)    Past Surgical History:  Procedure Laterality Date   FRACTURE SURGERY     right shoulder, both wrists    JOINT REPLACEMENT     HIP- bilateral   Current Outpatient Medications on File Prior to Visit  Medication Sig Dispense Refill   ALPRAZolam  (XANAX ) 0.5 MG tablet Take 1 tablet (0.5 mg total) by mouth 3 (three) times daily as needed. for anxiety 30 tablet 2   apixaban  (ELIQUIS ) 2.5 MG TABS tablet Take 1 tablet (2.5 mg total) by mouth 2 (two) times daily. 60 tablet 11   denosumab  (PROLIA ) 60 MG/ML SOSY injection Inject 60 mg into the skin every 6 (six) months. (Patient not taking: Reported on 09/11/2023)     diclofenac  Sodium (VOLTAREN ) 1 % GEL Apply topically 4 (four) times daily.     escitalopram  (LEXAPRO ) 10 MG tablet Take 1 tablet (10 mg total)  by mouth daily. 90 tablet 3   furosemide  (LASIX ) 20 MG tablet Take 1 tablet (20 mg total) by mouth daily. 90 tablet 3   metoprolol  tartrate (LOPRESSOR ) 25 MG tablet Take 1 tablet (25 mg total) by mouth 2 (two) times daily. 180 tablet 3   predniSONE  (DELTASONE ) 5 MG tablet Take 1 tablet (5 mg total) by mouth daily with breakfast. 90 tablet 3   traMADol  (ULTRAM ) 50 MG tablet Take 1 tablet (50 mg total) by mouth every 6 (six) hours as needed. 120 tablet 0   triamcinolone  cream (KENALOG ) 0.1 % Apply 1 Application topically 2 (two) times daily. 30 g 0   triamcinolone  cream (KENALOG ) 0.1 % Apply 1 Application topically 2 (two) times daily. 80 g 0   No current facility-administered medications on file prior to visit.          Allergies  Allergen Reactions   Pneumococcal Vaccines Other (See Comments)    Pt had localized reaction to injection of Pneumococcal 23 - Swelling and redness in upper arm that extend to but not beyond elbow   Social History   Socioeconomic History   Marital status: Widowed    Spouse name: Not on file   Number of children: 1   Years of education: Not on file   Highest education level:  Not on file  Occupational History   Not on file  Tobacco Use   Smoking status: Never   Smokeless tobacco: Never  Vaping Use   Vaping status: Never Used  Substance and Sexual Activity   Alcohol use: Not Currently   Drug use: Never   Sexual activity: Not Currently    Comment: raised tobacco, retired  Other Topics Concern   Not on file  Social History Narrative   Lives alone   Right handed    Caffeine: 1/2-1 cup coffee in AM   Social Drivers of Health   Financial Resource Strain: Low Risk  (06/04/2023)   Overall Financial Resource Strain (CARDIA)    Difficulty of Paying Living Expenses: Not hard at all  Food Insecurity: No Food Insecurity (06/04/2023)   Hunger Vital Sign    Worried About Running Out of Food in the Last Year: Never true    Ran Out of Food in the Last  Year: Never true  Transportation Needs: No Transportation Needs (06/04/2023)   PRAPARE - Administrator, Civil Service (Medical): No    Lack of Transportation (Non-Medical): No  Physical Activity: Inactive (06/04/2023)   Exercise Vital Sign    Days of Exercise per Week: 0 days    Minutes of Exercise per Session: 0 min  Stress: No Stress Concern Present (06/04/2023)   [employer] of Occupational Health - Occupational Stress Questionnaire    Feeling of Stress : Not at all  Social Connections: Moderately Isolated (06/04/2023)   Social Connection and Isolation Panel    Frequency of Communication with Friends and Family: More than three times a week    Frequency of Social Gatherings with Friends and Family: Three times a week    Attends Religious Services: 1 to 4 times per year    Active Member of Clubs or Organizations: No    Attends Banker Meetings: Never    Marital Status: Widowed  Intimate Partner Violence: Not At Risk (06/04/2023)   Humiliation, Afraid, Rape, and Kick questionnaire    Fear of Current or Ex-Partner: No    Emotionally Abused: No    Physically Abused: No    Sexually Abused: No     Review of Systems  All other systems reviewed and are negative.      Objective:   Physical Exam Vitals reviewed.  Constitutional:      General: She is not in acute distress.    Appearance: She is well-developed and normal weight. She is not ill-appearing, toxic-appearing or diaphoretic.  HENT:     Mouth/Throat:     Mouth: Mucous membranes are dry.     Pharynx: No oropharyngeal exudate or posterior oropharyngeal erythema.  Eyes:     Extraocular Movements: Extraocular movements intact.     Conjunctiva/sclera: Conjunctivae normal.  Cardiovascular:     Rate and Rhythm: Normal rate and regular rhythm.     Heart sounds: Normal heart sounds. No murmur heard.    No friction rub. No gallop.  Pulmonary:     Effort: Pulmonary effort is normal. No respiratory  distress.     Breath sounds: Normal breath sounds. No stridor. No wheezing or rales.  Abdominal:     General: Bowel sounds are normal. There is no distension.     Palpations: Abdomen is soft.     Tenderness: There is no abdominal tenderness. There is no guarding or rebound.  Musculoskeletal:     Right knee: No MCL or LCL tenderness.  Lymphadenopathy:  Cervical: No cervical adenopathy.  Skin:    Findings: No bruising or lesion. Rash is not papular, pustular, scaling, urticarial or vesicular.  Neurological:     General: No focal deficit present.     Mental Status: She is alert and oriented to person, place, and time.     Cranial Nerves: No cranial nerve deficit.     Motor: Weakness present.     Coordination: Coordination normal.     Gait: Gait normal.     Deep Tendon Reflexes: Reflexes normal.  Psychiatric:        Attention and Perception: Attention and perception normal.        Mood and Affect: Mood is not elated. Affect is not labile, blunt, flat, angry or tearful.        Speech: Speech normal.        Behavior: Behavior is not agitated, slowed, aggressive, withdrawn, hyperactive or combative.        Assessment & Plan:   Flu vaccine need - Plan: Flu vaccine HIGH DOSE PF(Fluzone Trivalent)  Localized edema  Stage 3b chronic kidney disease (HCC) There are no skin tears or wounds on her arms or legs to be treated by home health nursing.  I explained this to the patient.  The today there are no lesions that even need a Band-Aid.  Therefore I see no indication for home health nursing.  I explained this to the patient and her son.  Simple skin tears can be treated with a Band-Aid and does not require home health nursing.  If serious wounds develop we would be happy to see her.  I would request a second opinion with dermatology regarding the papular, nodular rash on her right forearm with burning pain.  This is improving after applying topical steroids.  Please see the previous office  visit.  It had some characteristics of shingles.  However the duration seems very atypical and the rash does continue to persist.  Patient appears slightly dehydrated today.  I recommended using Lasix  only as needed to avoid dehydration and worsening renal function.  Patient is using the Lasix  for swelling in her legs

## 2023-10-17 ENCOUNTER — Telehealth: Payer: Self-pay

## 2023-10-17 NOTE — Telephone Encounter (Signed)
 LM to speak with someone about the patient's appointment for today: Referring To Provider Information Bette Bon [address] [place] [address] [phone]   Referral Start Date: 09/30/2023 Referral End Date: 09/29/2024

## 2023-11-05 ENCOUNTER — Other Ambulatory Visit: Payer: Self-pay | Admitting: Family Medicine

## 2023-11-05 DIAGNOSIS — M51369 Other intervertebral disc degeneration, lumbar region without mention of lumbar back pain or lower extremity pain: Secondary | ICD-10-CM

## 2023-11-06 ENCOUNTER — Other Ambulatory Visit: Payer: Self-pay | Admitting: Family Medicine

## 2023-11-06 DIAGNOSIS — M51369 Other intervertebral disc degeneration, lumbar region without mention of lumbar back pain or lower extremity pain: Secondary | ICD-10-CM

## 2023-11-06 NOTE — Telephone Encounter (Signed)
 Copied from CRM [id number]. Topic: Clinical - Medication Refill >> Nov 06, 2023 11:42 AM Tysheama G wrote: Medication: traMADol  (ULTRAM ) 50 MG tablet  Has the patient contacted their pharmacy? Yes (Agent: If no, request that the patient contact the pharmacy for the refill. If patient does not wish to contact the pharmacy document the reason why and proceed with request.) (Agent: If yes, when and what did the pharmacy advise?)  This is the patient's preferred pharmacy:  [facility] [address], [place] - [address] [place] [address] Phone: [phone] Fax: [phone]  Is this the correct pharmacy for this prescription? Yes If no, delete pharmacy and type the correct one.   Has the prescription been filled recently? No  Is the patient out of the medication? Yes  Has the patient been seen for an appointment in the last year OR does the patient have an upcoming appointment? Yes  Can we respond through MyChart? No  Agent: Please be advised that Rx refills may take up to 3 business days. We ask that you follow-up with your pharmacy.

## 2023-11-06 NOTE — Telephone Encounter (Signed)
 Requested medications are due for refill today.  no  Requested medications are on the active medications list.  yes  Last refill. today  Future visit scheduled.   no  Notes to clinic.  Refill/refusal not delegated.    Requested Prescriptions  Pending Prescriptions Disp Refills   traMADol  (ULTRAM ) 50 MG tablet 120 tablet 0    Sig: Take 1 tablet (50 mg total) by mouth every 6 (six) hours as needed.     Not Delegated - Analgesics:  Opioid Agonists Failed - 11/06/2023 12:00 PM      Failed - This refill cannot be delegated      Failed - Urine Drug Screen completed in last 360 days      Passed - Valid encounter within last 3 months    Recent Outpatient Visits           3 weeks ago Flu vaccine need   [place] [facility] Duanne Butler DASEN, MD   1 month ago Cellulitis of right upper extremity   [place] [facility] Duanne Butler DASEN, MD   2 months ago Cellulitis of right upper extremity   [facility] [facility] Duanne Butler DASEN, MD   2 months ago Localized edema   [place] [facility] Duanne Butler DASEN, MD   3 months ago Urinary tract infection without hematuria, site unspecified   [place] [facility] Medicine Aletha Bene, MD

## 2023-11-29 ENCOUNTER — Other Ambulatory Visit: Payer: Self-pay | Admitting: Family Medicine

## 2023-11-29 DIAGNOSIS — M51369 Other intervertebral disc degeneration, lumbar region without mention of lumbar back pain or lower extremity pain: Secondary | ICD-10-CM

## 2023-11-30 ENCOUNTER — Other Ambulatory Visit: Payer: Self-pay | Admitting: Family Medicine

## 2023-12-02 NOTE — Telephone Encounter (Signed)
 Requested medication (s) are due for refill today: yes  Requested medication (s) are on the active medication list: yes  Last refill:  11/06/23  Future visit scheduled: no  Notes to clinic:  Unable to refill per protocol, cannot delegate.      Requested Prescriptions  Pending Prescriptions Disp Refills   traMADol  (ULTRAM ) 50 MG tablet [Pharmacy Med Name: traMADol  HCl 50 MG Oral Tablet] 120 tablet 0    Sig: TAKE 1 TABLET BY MOUTH EVERY 6 HOURS AS NEEDED     Not Delegated - Analgesics:  Opioid Agonists Failed - 12/02/2023  8:48 AM      Failed - This refill cannot be delegated      Failed - Urine Drug Screen completed in last 360 days      Passed - Valid encounter within last 3 months    Recent Outpatient Visits           1 month ago Flu vaccine need   [place] [facility] Duanne Butler DASEN, MD   2 months ago Cellulitis of right upper extremity   [place] [facility] Duanne, Butler DASEN, MD   3 months ago Cellulitis of right upper extremity   [place] [facility] Medicine Duanne, Butler DASEN, MD   3 months ago Localized edema   [place] [facility] Medicine Duanne Butler DASEN, MD   4 months ago Urinary tract infection without hematuria, site unspecified   [place] [facility] Medicine Aletha Bene, MD              Signed Prescriptions Disp Refills   escitalopram  (LEXAPRO ) 10 MG tablet 90 tablet 0    Sig: Take 1 tablet by mouth once daily     Psychiatry:  Antidepressants - SSRI Passed - 12/02/2023  8:48 AM      Passed - Valid encounter within last 6 months    Recent Outpatient Visits           1 month ago Flu vaccine need   [place] [facility] Medicine Duanne Butler DASEN, MD   2 months ago Cellulitis of right upper extremity   [place] [facility] Duanne Butler DASEN, MD   3 months ago Cellulitis of right upper extremity   [place] [facility]  Duanne Butler DASEN, MD   3 months ago Localized edema   [place] [facility] Medicine Duanne Butler DASEN, MD   4 months ago Urinary tract infection without hematuria, site unspecified   [place] [facility] Medicine Aletha Bene, MD

## 2023-12-02 NOTE — Telephone Encounter (Signed)
 Requested Prescriptions  Pending Prescriptions Disp Refills   escitalopram  (LEXAPRO ) 10 MG tablet [Pharmacy Med Name: Escitalopram  Oxalate 10 MG Oral Tablet] 90 tablet 0    Sig: Take 1 tablet by mouth once daily     Psychiatry:  Antidepressants - SSRI Passed - 12/02/2023  8:48 AM      Passed - Valid encounter within last 6 months    Recent Outpatient Visits           1 month ago Flu vaccine need   [place] [facility] Medicine Duanne Butler DASEN, MD   2 months ago Cellulitis of right upper extremity   [place] [facility] Duanne Butler DASEN, MD   3 months ago Cellulitis of right upper extremity   [place] [facility] Medicine Duanne, Butler DASEN, MD   3 months ago Localized edema   [place] [facility] Medicine Duanne Butler DASEN, MD   4 months ago Urinary tract infection without hematuria, site unspecified   [place] [facility] Medicine Aletha Bene, MD               traMADol  (ULTRAM ) 50 MG tablet [Pharmacy Med Name: traMADol  HCl 50 MG Oral Tablet] 120 tablet 0    Sig: TAKE 1 TABLET BY MOUTH EVERY 6 HOURS AS NEEDED     Not Delegated - Analgesics:  Opioid Agonists Failed - 12/02/2023  8:48 AM      Failed - This refill cannot be delegated      Failed - Urine Drug Screen completed in last 360 days      Passed - Valid encounter within last 3 months    Recent Outpatient Visits           1 month ago Flu vaccine need   [place] [facility] Duanne Butler DASEN, MD   2 months ago Cellulitis of right upper extremity   [place] [facility] Medicine Duanne Butler DASEN, MD   3 months ago Cellulitis of right upper extremity   [place] [facility] Medicine Duanne Butler DASEN, MD   3 months ago Localized edema   [place] [facility] Medicine Duanne Butler DASEN, MD   4 months ago Urinary tract infection without hematuria, site unspecified    [facility]  Medicine Aletha Bene, MD

## 2023-12-02 NOTE — Telephone Encounter (Signed)
 Requested Prescriptions  Pending Prescriptions Disp Refills   apixaban  (ELIQUIS ) 2.5 MG TABS tablet [Pharmacy Med Name: Eliquis  2.5 MG Oral Tablet] 60 tablet 5    Sig: Take 1 tablet by mouth twice daily     Hematology:  Anticoagulants - apixaban  Failed - 12/02/2023 10:41 AM      Failed - Cr in normal range and within 360 days    Creat  Date Value Ref Range Status  07/18/2023 1.58 (H) 0.60 - 0.95 mg/dL Final         Failed - ALT in normal range and within 360 days    ALT  Date Value Ref Range Status  07/18/2023 35 (H) 6 - 29 U/L Final         Passed - PLT in normal range and within 360 days    Platelets  Date Value Ref Range Status  07/18/2023 207 140 - 400 Thousand/uL Final  02/25/2019 253 150 - 450 x10E3/uL Final         Passed - HGB in normal range and within 360 days    Hemoglobin  Date Value Ref Range Status  07/18/2023 12.4 11.7 - 15.5 g/dL Final  98/78/7978 85.8 11.1 - 15.9 g/dL Final         Passed - HCT in normal range and within 360 days    HCT  Date Value Ref Range Status  07/18/2023 38.0 35.0 - 45.0 % Final   Hematocrit  Date Value Ref Range Status  02/25/2019 43.1 34.0 - 46.6 % Final         Passed - AST in normal range and within 360 days    AST  Date Value Ref Range Status  07/18/2023 23 10 - 35 U/L Final         Passed - Valid encounter within last 12 months    Recent Outpatient Visits           1 month ago Flu vaccine need   [place] [facility] Medicine Duanne Butler DASEN, MD   2 months ago Cellulitis of right upper extremity   [place] [facility] Medicine Duanne Butler DASEN, MD   3 months ago Cellulitis of right upper extremity   [place] [facility] Medicine Duanne Butler DASEN, MD   3 months ago Localized edema   [place] [facility] Medicine Duanne Butler DASEN, MD   4 months ago Urinary tract infection without hematuria, site unspecified   [place] [facility] Medicine Aletha Bene, MD

## 2023-12-03 ENCOUNTER — Telehealth: Payer: Self-pay

## 2023-12-03 NOTE — Telephone Encounter (Signed)
 Copied from CRM [id number]. Topic: Clinical - Medication Question >> Dec 03, 2023 11:23 AM [name] wrote: PCA [place] ALPRAZolam  (XANAX ) 0.5 MG tablet- Wants to know why medication is being denied.  [name] (son) [phone]- call him first he picks up her medication Call back [phone]

## 2023-12-04 ENCOUNTER — Other Ambulatory Visit: Payer: Self-pay | Admitting: Family Medicine

## 2023-12-04 DIAGNOSIS — F5101 Primary insomnia: Secondary | ICD-10-CM

## 2023-12-04 MED ORDER — ALPRAZOLAM 0.5 MG PO TABS: 0.5000 mg | ORAL_TABLET | Freq: Three times a day (TID) | ORAL | 2 refills | Status: AC | PRN

## 2023-12-17 ENCOUNTER — Ambulatory Visit: Payer: Self-pay

## 2023-12-17 ENCOUNTER — Encounter: Payer: Self-pay | Admitting: Family Medicine

## 2023-12-17 ENCOUNTER — Ambulatory Visit (INDEPENDENT_AMBULATORY_CARE_PROVIDER_SITE_OTHER): Admitting: Family Medicine

## 2023-12-17 VITALS — BP 126/82 | HR 81 | Temp 98.5°F

## 2023-12-17 DIAGNOSIS — R4182 Altered mental status, unspecified: Secondary | ICD-10-CM

## 2023-12-17 DIAGNOSIS — G4700 Insomnia, unspecified: Secondary | ICD-10-CM | POA: Diagnosis not present

## 2023-12-17 DIAGNOSIS — F13939 Sedative, hypnotic or anxiolytic use, unspecified with withdrawal, unspecified: Secondary | ICD-10-CM | POA: Diagnosis not present

## 2023-12-17 DIAGNOSIS — N1832 Chronic kidney disease, stage 3b: Secondary | ICD-10-CM | POA: Diagnosis not present

## 2023-12-17 MED ORDER — CEPHALEXIN 500 MG PO CAPS: 500.0000 mg | ORAL_CAPSULE | Freq: Four times a day (QID) | ORAL | 0 refills | Status: AC

## 2023-12-17 NOTE — Telephone Encounter (Signed)
 FYI Only or Action Required?: FYI only for provider: appointment scheduled on 12/17/2023.  Patient was last seen in primary care on 10/16/2023 by Duanne Butler DASEN, MD.  Called Nurse Triage reporting Altered Mental Status.  Symptoms began yesterday.  Interventions attempted: Nothing.  Symptoms are: gradually worsening.  Triage Disposition: See Physician Within 24 Hours  Patient/caregiver understands and will follow disposition?: Yes           Copied from CRM [id number]. Topic: Clinical - Red Word Triage >> Dec 17, 2023  8:06 AM Avram MATSU wrote: Red Word that prompted transfer to Nurse Triage: patient has been having hallucinations, she might have a uti, she also stop taking her xanax . Reason for Disposition  [1] Hallucinations AND [2] NEW-ONSET  Answer Assessment - Initial Assessment Questions This RN spoke with the pt's daughter regarding the pt's symptoms. Concerned about a possible UTI. Denies fever or thoughts of hurting herself or others.   1. MAIN CONCERN: What happened that made you call today?     Symptoms are concerning to the pt's daughter 2. DESCRIPTION: What are the hallucinations like? Describe them for me. (e.g., auditory, visual, tactile; worsening; threatening) If auditory, ask Are they telling you to hurt yourself or someone else?     Saw rats on the floor and running in the house. Also seeing people that are not there.  3. ONSET: When did this start? (e.g., hours, days, months)     Yesterday  4. PATTERN: Do they come and go, or are they present all the time?  Are they present now?     Comes and goes  5. PRIOR HALLUCINATIONS: Have you had hallucinations in the past? (e.g., same, different, worse)     No  6. MENTAL HEALTH HISTORY: Have you ever been diagnosed with schizophrenia or any other mental health problem? (e.g., bipolar disorder, schizophrenia; dementia, Parkinsons)     No 7. MENTAL HEALTH MEDICINES: Are you taking any medicines  for depression or other mental health problems? (e.g., psychiatric medicines; sleeping pills)     No  8. MEDICINE CHANGES: Did you recently stop taking a medicine? (e.g., antidepressant, barbiturates, benzodiazepine, gabapentin )  Did you recently start a new medicine or increase the dose? (e.g., antidepressant, digoxin, sleep medicine, steroid, Parkinson's medicine)     Stopped taking her xanax  medication  9. SUPPORT: Is there anyone else with you?       With her daughter  22. OTHER SYMPTOMS: Are there any other symptoms? (e.g., difficulty breathing, headache, fever, weakness)       Agitated and combative  Protocols used: Hallucinations-A-AH

## 2023-12-17 NOTE — Progress Notes (Addendum)
 Patient Office Visit  Assessment & Plan:  Altered mental status, unspecified altered mental status type -     Urinalysis, Routine w reflex microscopic -     CBC with Differential/Platelet -     Comprehensive metabolic panel with GFR -     Cephalexin ; Take 1 capsule (500 mg total) by mouth 4 (four) times daily for 7 days.  Dispense: 28 capsule; Refill: 0 -     Urine Culture  Stage 3b chronic kidney disease (HCC)  Insomnia, unspecified type  Benzodiazepine withdrawal with complication (HCC)  Other orders -     MICROSCOPIC MESSAGE   Assessment and Plan        Patient was unable to give us  a urine sample.  They will bring it in tomorrow.  We sent Keflex  antibiotic just in case.  Increase water intake.  Follow-up on lab work and notify patient/family.  If worsening symptoms she may need to go to the ER for further evaluation and possible IVF Encouraged her to increase fluids to avoid dehydration. Patient may need different sleep aid in the future.  Return if symptoms worsen or fail to improve.   Subjective:    Patient ID: [name], female    DOB: May 03, 1931  Age: 89 y.o. MRN: [id number]  Chief Complaint  Patient presents with   Hallucinations    Pt states that she has been hearing music when no music was playing. Pt has also been seeing things. Pt states that she saw a red fish last night.     HPI Discussed the use of AI scribe software for clinical note transcription with the patient, who gave verbal consent to proceed.  History of Present Illness      History of Present Illness [name] is a 89 year old female who presents with visual hallucinations and confusion since yesterday. Son comes in with her today.  She has been experiencing visual hallucinations, such as seeing 'two rats', red fish and hearing music without a source, which began yesterday and are not constant.  She recently stopped taking Xanax , which she had been using for sleep for several  years. Since discontinuing it about a week ago, she has had difficulty sleeping and feels more agitated and confused. She did take it last night. Family wonders if she is having withdrawal symptoms from not taking xanax  or it's a UTI  Her caregiver notes increased confusion and agitation since yesterday. She is able to recall the current year and knows she is at the doctor's office. There is also a report of decreased appetite and inadequate fluid intake over the past few days.  She has a history of urinary tract infections, with the last known infection occurring in May, treated with antibiotics. CKD Stage 3B- Patient does not drink enough water so there's concern for possible worsening kidney function/dehydration.  She is not having any  nausea, vomiting, cough, or shortness of breath. She reports chronic back pain and difficulty walking, requiring the use of a wheelchair. She has a known heart murmur with no recent changes in cardiac symptoms.  Physical Exam CARDIOVASCULAR: Heart murmur auscultated.  Assessment and Plan Altered mental status with visual hallucinations Acute onset of altered mental status with visual hallucinations. Differential includes sepsis, delirium, UTI, and sedative/hypnotic withdrawal. Possible infection and dehydration considered. - Ordered blood work for infection and dehydration assessment. - Attempted urine sample collection for urinalysis. - Consider antibiotics if urinalysis indicates infection- sent Keflex  (patient unable to give us   a UA sample) - Monitor symptoms, consider ER if deterioration and IVF  Sedative/hypnotic withdrawal after discontinuing Xanax  Withdrawal symptoms post-Xanax  discontinuation may contribute to altered mental status.  Insomnia Chronic insomnia previously managed with Xanax , contributing to mental status changes.  Chronic kidney disease, stage 3b Stage 3b CKD may complicate infection and dehydration management.  Heart  murmur Known heart murmur, no acute changes. Patient has a history of this.  The ASCVD Risk score (Arnett DK, et al., 2019) failed to calculate for the following reasons:   The 2019 ASCVD risk score is only valid for ages 7 to 31  Past Medical History:  Diagnosis Date   Cataract    CKD (chronic kidney disease) stage 3, GFR 30-59 ml/min (HCC)    DDD (degenerative disc disease), lumbar    Fatty liver disease, nonalcoholic    2007- admitted with encephalopathy unsure of cause, resolved sponatneously   Hypertension    Osteoarthritis, knee    Pulmonary embolism (HCC)    Past Surgical History:  Procedure Laterality Date   FRACTURE SURGERY     right shoulder, both wrists    JOINT REPLACEMENT     HIP- bilateral   Social History   Tobacco Use   Smoking status: Never   Smokeless tobacco: Never  Vaping Use   Vaping status: Never Used  Substance Use Topics   Alcohol use: Not Currently   Drug use: Never   Family History  Problem Relation Age of Onset   Heart disease Mother    Cancer Father        metastatic unsure of primary   Early death Brother        died at 17 y/o   Liver cancer Maternal Grandmother    Healthy Son    Allergies  Allergen Reactions   Pneumococcal Vaccines Other (See Comments)    Pt had localized reaction to injection of Pneumococcal 23 - Swelling and redness in upper arm that extend to but not beyond elbow    ROS    Objective:    BP 126/82   Pulse 81   Temp 98.5 F (36.9 C)   SpO2 98%  BP Readings from Last 3 Encounters:  12/17/23 126/82  10/16/23 120/62  09/11/23 116/62   Wt Readings from Last 3 Encounters:  10/16/23 133 lb (60.3 kg)  09/11/23 133 lb 6 oz (60.5 kg)  08/28/23 138 lb (62.6 kg)    Physical Exam Vitals and nursing note reviewed.  Constitutional:      General: She is not in acute distress.    Appearance: Normal appearance.     Comments: In wheelchair, comes in with her son  HENT:     Head: Normocephalic.     Right Ear:  Tympanic membrane, ear canal and external ear normal.     Left Ear: Tympanic membrane, ear canal and external ear normal.  Eyes:     Extraocular Movements: Extraocular movements intact.     Pupils: Pupils are equal, round, and reactive to light.  Cardiovascular:     Rate and Rhythm: Normal rate and regular rhythm.     Heart sounds: Murmur heard.  Pulmonary:     Effort: Pulmonary effort is normal.     Breath sounds: Normal breath sounds. No wheezing.  Musculoskeletal:     Right lower leg: No edema.     Left lower leg: No edema.  Neurological:     General: No focal deficit present.     Mental Status: She is alert.  Comments: Patient states she is seeing things at home and hearing music  Psychiatric:        Mood and Affect: Mood normal.        Behavior: Behavior normal.      Results for orders placed or performed in visit on 12/17/23  Urine Culture   Specimen: Urine  Result Value Ref Range   MICRO NUMBER: [id number]    SPECIMEN QUALITY: Adequate    Sample Source URINE    STATUS: FINAL    ISOLATE 1: Enterococcus faecalis (A)       Susceptibility   Enterococcus faecalis - URINE CULTURE POSITIVE 1    AMPICILLIN 8 Sensitive     VANCOMYCIN 1 Sensitive     NITROFURANTOIN * <=16 Sensitive      * Legend: S = Susceptible  I = Intermediate R = Resistant  NS = Not susceptible SDD = Susceptible Dose Dependent * = Not Tested  NR = Not Reported **NN = See Therapy Comments   MICROSCOPIC MESSAGE  Result Value Ref Range   Note    Urinalysis, Routine w reflex microscopic  Result Value Ref Range   Color, Urine YELLOW YELLOW   APPearance CLEAR CLEAR   Specific Gravity, Urine 1.020 1.001 - 1.035   pH 5.5 5.0 - 8.0   Glucose, UA NEGATIVE NEGATIVE   Bilirubin Urine NEGATIVE NEGATIVE   Ketones, ur NEGATIVE NEGATIVE   Hgb urine dipstick NEGATIVE NEGATIVE   Protein, ur TRACE (A) NEGATIVE   Nitrite NEGATIVE NEGATIVE   Leukocytes,Ua 2+ (A) NEGATIVE  CBC with Differential/Platelet   Result Value Ref Range   WBC 8.0 3.8 - 10.8 Thousand/uL   RBC 4.11 3.80 - 5.10 Million/uL   Hemoglobin 12.7 11.7 - 15.5 g/dL   HCT 61.0 64.9 - 54.9 %   MCV 94.6 80.0 - 100.0 fL   MCH 30.9 27.0 - 33.0 pg   MCHC 32.6 32.0 - 36.0 g/dL   RDW 86.9 88.9 - 84.9 %   Platelets 271 140 - 400 Thousand/uL   MPV 10.7 7.5 - 12.5 fL   Neutro Abs 6,128 1,500 - 7,800 cells/uL   Absolute Lymphocytes 1,312 850 - 3,900 cells/uL   Absolute Monocytes 536 200 - 950 cells/uL   Eosinophils Absolute 8 (L) 15 - 500 cells/uL   Basophils Absolute 16 0 - 200 cells/uL   Neutrophils Relative % 76.6 %   Total Lymphocyte 16.4 %   Monocytes Relative 6.7 %   Eosinophils Relative 0.1 %   Basophils Relative 0.2 %  Comprehensive metabolic panel with GFR  Result Value Ref Range   Glucose, Bld 116 (H) 65 - 99 mg/dL   BUN 39 (H) 7 - 25 mg/dL   Creat 8.45 (H) 9.39 - 0.95 mg/dL   eGFR 32 (L) > OR = 60 mL/min/1.36m2   BUN/Creatinine Ratio 25 (H) 6 - 22 (calc)   Sodium 141 135 - 146 mmol/L   Potassium 4.5 3.5 - 5.3 mmol/L   Chloride 105 98 - 110 mmol/L   CO2 26 20 - 32 mmol/L   Calcium 10.0 8.6 - 10.4 mg/dL   Total Protein 6.4 6.1 - 8.1 g/dL   Albumin  4.1 3.6 - 5.1 g/dL   Globulin 2.3 1.9 - 3.7 g/dL (calc)   AG Ratio 1.8 1.0 - 2.5 (calc)   Total Bilirubin 0.6 0.2 - 1.2 mg/dL   Alkaline phosphatase (APISO) 66 37 - 153 U/L   AST 23 10 - 35 U/L   ALT 15 6 - 29  U/L

## 2023-12-18 ENCOUNTER — Ambulatory Visit: Payer: Self-pay | Admitting: Family Medicine

## 2023-12-18 LAB — COMPREHENSIVE METABOLIC PANEL WITH GFR
AG Ratio: 1.8 (calc) (ref 1.0–2.5)
ALT: 15 U/L (ref 6–29)
AST: 23 U/L (ref 10–35)
Albumin: 4.1 g/dL (ref 3.6–5.1)
Alkaline phosphatase (APISO): 66 U/L (ref 37–153)
BUN/Creatinine Ratio: 25 (calc) — ABNORMAL HIGH (ref 6–22)
BUN: 39 mg/dL — ABNORMAL HIGH (ref 7–25)
CO2: 26 mmol/L (ref 20–32)
Calcium: 10 mg/dL (ref 8.6–10.4)
Chloride: 105 mmol/L (ref 98–110)
Creat: 1.54 mg/dL — ABNORMAL HIGH (ref 0.60–0.95)
Globulin: 2.3 g/dL (ref 1.9–3.7)
Glucose, Bld: 116 mg/dL — ABNORMAL HIGH (ref 65–99)
Potassium: 4.5 mmol/L (ref 3.5–5.3)
Sodium: 141 mmol/L (ref 135–146)
Total Bilirubin: 0.6 mg/dL (ref 0.2–1.2)
Total Protein: 6.4 g/dL (ref 6.1–8.1)
eGFR: 32 mL/min/1.73m2 — ABNORMAL LOW (ref >=60)

## 2023-12-18 LAB — CBC WITH DIFFERENTIAL/PLATELET
Absolute Lymphocytes: 1312 {cells}/uL (ref 850–3900)
Absolute Monocytes: 536 {cells}/uL (ref 200–950)
Basophils Absolute: 16 {cells}/uL (ref 0–200)
Basophils Relative: 0.2 %
Eosinophils Absolute: 8 {cells}/uL — ABNORMAL LOW (ref 15–500)
Eosinophils Relative: 0.1 %
HCT: 38.9 % (ref 35.0–45.0)
Hemoglobin: 12.7 g/dL (ref 11.7–15.5)
MCH: 30.9 pg (ref 27.0–33.0)
MCHC: 32.6 g/dL (ref 32.0–36.0)
MCV: 94.6 fL (ref 80.0–100.0)
MPV: 10.7 fL (ref 7.5–12.5)
Monocytes Relative: 6.7 %
Neutro Abs: 6128 {cells}/uL (ref 1500–7800)
Neutrophils Relative %: 76.6 %
Platelets: 271 Thousand/uL (ref 140–400)
RBC: 4.11 Million/uL (ref 3.80–5.10)
RDW: 13 % (ref 11.0–15.0)
Total Lymphocyte: 16.4 %
WBC: 8 Thousand/uL (ref 3.8–10.8)

## 2023-12-18 NOTE — Addendum Note (Signed)
 Addended by: ANGELENA RONAL BRADLEY K on: 12/18/2023 11:16 AM   Modules accepted: Orders

## 2023-12-19 ENCOUNTER — Other Ambulatory Visit: Payer: Self-pay

## 2023-12-19 ENCOUNTER — Telehealth: Payer: Self-pay | Admitting: Family Medicine

## 2023-12-19 DIAGNOSIS — M51369 Other intervertebral disc degeneration, lumbar region without mention of lumbar back pain or lower extremity pain: Secondary | ICD-10-CM

## 2023-12-19 LAB — URINALYSIS, ROUTINE W REFLEX MICROSCOPIC
Bilirubin Urine: NEGATIVE
Glucose, UA: NEGATIVE
Hgb urine dipstick: NEGATIVE
Ketones, ur: NEGATIVE
Nitrite: NEGATIVE
Specific Gravity, Urine: 1.02 (ref 1.001–1.035)
pH: 5.5 (ref 5.0–8.0)

## 2023-12-19 LAB — MICROSCOPIC MESSAGE

## 2023-12-19 MED ORDER — PREDNISONE 5 MG PO TABS: 5.0000 mg | ORAL_TABLET | Freq: Every day | ORAL | 3 refills | Status: AC

## 2023-12-19 MED ORDER — TRAMADOL HCL 50 MG PO TABS: 50.0000 mg | ORAL_TABLET | Freq: Four times a day (QID) | ORAL | 0 refills | Status: AC | PRN

## 2023-12-19 NOTE — Telephone Encounter (Signed)
 Copied from CRM [id number]. Topic: Clinical - Lab/Test Results >> Dec 19, 2023  2:11 PM Avram MATSU wrote: Reason for CRM: son randy is calling about his mother labs, I relayed what I could but he has some question about her antibiotics. [phone]

## 2023-12-19 NOTE — Progress Notes (Signed)
 Pt son verbalized understanding.

## 2023-12-21 LAB — URINE CULTURE
MICRO NUMBER:: 17231070
SPECIMEN QUALITY:: ADEQUATE

## 2023-12-22 NOTE — Telephone Encounter (Signed)
 Copied from CRM #[id number]. Topic: Clinical - Lab/Test Results >> Dec 22, 2023  8:39 AM Lonell PEDLAR wrote: Reason for CRM: Patient's son, Darina, called to obtain lab results that resulted from 11/16

## 2023-12-25 ENCOUNTER — Other Ambulatory Visit: Payer: Self-pay

## 2023-12-25 ENCOUNTER — Encounter: Payer: Self-pay | Admitting: Family Medicine

## 2023-12-25 DIAGNOSIS — N390 Urinary tract infection, site not specified: Secondary | ICD-10-CM

## 2023-12-25 MED ORDER — NITROFURANTOIN MONOHYD MACRO 100 MG PO CAPS: 100.0000 mg | ORAL_CAPSULE | Freq: Two times a day (BID) | ORAL | 0 refills | Status: AC

## 2023-12-26 NOTE — Telephone Encounter (Signed)
 Called pt. Daughter to gain information. No answer. Lvm for call back

## 2024-01-03 ENCOUNTER — Other Ambulatory Visit: Payer: Self-pay | Admitting: Family Medicine

## 2024-02-09 ENCOUNTER — Telehealth: Payer: Self-pay | Admitting: Family Medicine

## 2024-02-09 NOTE — Telephone Encounter (Signed)
 Prescription Request  02/09/2024  LOV: 10/16/2023  What is the name of the medication or equipment?   escitalopram  (LEXAPRO ) 10 MG tablet  **90 day script request**  Have you contacted your pharmacy to request a refill? Yes   Which pharmacy would you like this sent to?  [facility] [address], [place] - [address] [place] [address] Phone: [phone] Fax: [phone]    Patient notified that their request is being sent to the clinical staff for review and that they should receive a response within 2 business days.   Please advise pharmacist.

## 2024-02-09 NOTE — Telephone Encounter (Signed)
 Too early for RF 90 day supply sent in on 12/2.
